# Patient Record
Sex: Male | Born: 1960
Health system: Southern US, Community
[De-identification: ages and names within clinical notes are randomized; demographics above are authoritative.]

## PROBLEM LIST (undated history)

## (undated) DIAGNOSIS — D649 Anemia, unspecified: Secondary | ICD-10-CM

## (undated) DIAGNOSIS — K5790 Diverticulosis of intestine, part unspecified, without perforation or abscess without bleeding: Secondary | ICD-10-CM

## (undated) DIAGNOSIS — K859 Acute pancreatitis without necrosis or infection, unspecified: Secondary | ICD-10-CM

## (undated) DIAGNOSIS — E785 Hyperlipidemia, unspecified: Secondary | ICD-10-CM

## (undated) DIAGNOSIS — J189 Pneumonia, unspecified organism: Secondary | ICD-10-CM

## (undated) DIAGNOSIS — G039 Meningitis, unspecified: Secondary | ICD-10-CM

## (undated) DIAGNOSIS — G709 Myoneural disorder, unspecified: Secondary | ICD-10-CM

## (undated) DIAGNOSIS — I209 Angina pectoris, unspecified: Secondary | ICD-10-CM

## (undated) DIAGNOSIS — I509 Heart failure, unspecified: Secondary | ICD-10-CM

## (undated) DIAGNOSIS — K219 Gastro-esophageal reflux disease without esophagitis: Secondary | ICD-10-CM

## (undated) HISTORY — DX: Myoneural disorder, unspecified: G70.9

## (undated) HISTORY — DX: Diverticulosis of intestine, part unspecified, without perforation or abscess without bleeding: K57.90

## (undated) HISTORY — PX: TONSILLECTOMY AND ADENOIDECTOMY: SUR1326

## (undated) HISTORY — PX: TYMPANOSTOMY TUBE PLACEMENT: SHX32

## (undated) HISTORY — DX: Anemia, unspecified: D64.9

## (undated) HISTORY — DX: Hyperlipidemia, unspecified: E78.5

---

## 2004-08-05 ENCOUNTER — Ambulatory Visit (HOSPITAL_COMMUNITY): Admission: RE | Admit: 2004-08-05 | Discharge: 2004-08-05 | Payer: Self-pay | Admitting: Preventative Medicine

## 2005-01-14 ENCOUNTER — Emergency Department (HOSPITAL_COMMUNITY): Admission: EM | Admit: 2005-01-14 | Discharge: 2005-01-14 | Payer: Self-pay | Admitting: *Deleted

## 2005-10-26 ENCOUNTER — Ambulatory Visit: Payer: Self-pay | Admitting: Infectious Diseases

## 2005-10-26 ENCOUNTER — Ambulatory Visit: Payer: Self-pay | Admitting: Family Medicine

## 2005-10-26 ENCOUNTER — Ambulatory Visit: Payer: Self-pay | Admitting: Internal Medicine

## 2005-10-26 ENCOUNTER — Inpatient Hospital Stay (HOSPITAL_COMMUNITY): Admission: EM | Admit: 2005-10-26 | Discharge: 2005-11-01 | Payer: Self-pay | Admitting: *Deleted

## 2006-03-02 ENCOUNTER — Emergency Department (HOSPITAL_COMMUNITY): Admission: EM | Admit: 2006-03-02 | Discharge: 2006-03-02 | Payer: Self-pay | Admitting: Emergency Medicine

## 2006-08-08 ENCOUNTER — Emergency Department (HOSPITAL_COMMUNITY): Admission: EM | Admit: 2006-08-08 | Discharge: 2006-08-08 | Payer: Self-pay | Admitting: Emergency Medicine

## 2007-10-07 ENCOUNTER — Encounter: Admission: RE | Admit: 2007-10-07 | Discharge: 2007-10-07 | Payer: Self-pay | Admitting: Family Medicine

## 2008-07-22 ENCOUNTER — Inpatient Hospital Stay (HOSPITAL_COMMUNITY): Admission: EM | Admit: 2008-07-22 | Discharge: 2008-08-01 | Payer: Self-pay | Admitting: Emergency Medicine

## 2008-07-22 ENCOUNTER — Ambulatory Visit: Payer: Self-pay | Admitting: Internal Medicine

## 2008-07-24 ENCOUNTER — Ambulatory Visit: Payer: Self-pay | Admitting: Vascular Surgery

## 2008-07-24 ENCOUNTER — Encounter (INDEPENDENT_AMBULATORY_CARE_PROVIDER_SITE_OTHER): Payer: Self-pay | Admitting: Internal Medicine

## 2008-09-25 ENCOUNTER — Encounter: Admission: RE | Admit: 2008-09-25 | Discharge: 2008-09-25 | Payer: Self-pay | Admitting: *Deleted

## 2010-09-24 LAB — GLUCOSE, CAPILLARY
Glucose-Capillary: 103 mg/dL — ABNORMAL HIGH (ref 70–99)
Glucose-Capillary: 171 mg/dL — ABNORMAL HIGH (ref 70–99)
Glucose-Capillary: 231 mg/dL — ABNORMAL HIGH (ref 70–99)
Glucose-Capillary: 238 mg/dL — ABNORMAL HIGH (ref 70–99)
Glucose-Capillary: 258 mg/dL — ABNORMAL HIGH (ref 70–99)
Glucose-Capillary: 104 mg/dL — ABNORMAL HIGH (ref 70–99)
Glucose-Capillary: 104 mg/dL — ABNORMAL HIGH (ref 70–99)
Glucose-Capillary: 104 mg/dL — ABNORMAL HIGH (ref 70–99)
Glucose-Capillary: 111 mg/dL — ABNORMAL HIGH (ref 70–99)
Glucose-Capillary: 112 mg/dL — ABNORMAL HIGH (ref 70–99)
Glucose-Capillary: 117 mg/dL — ABNORMAL HIGH (ref 70–99)
Glucose-Capillary: 126 mg/dL — ABNORMAL HIGH (ref 70–99)
Glucose-Capillary: 133 mg/dL — ABNORMAL HIGH (ref 70–99)
Glucose-Capillary: 148 mg/dL — ABNORMAL HIGH (ref 70–99)
Glucose-Capillary: 152 mg/dL — ABNORMAL HIGH (ref 70–99)
Glucose-Capillary: 155 mg/dL — ABNORMAL HIGH (ref 70–99)
Glucose-Capillary: 161 mg/dL — ABNORMAL HIGH (ref 70–99)
Glucose-Capillary: 179 mg/dL — ABNORMAL HIGH (ref 70–99)
Glucose-Capillary: 186 mg/dL — ABNORMAL HIGH (ref 70–99)
Glucose-Capillary: 193 mg/dL — ABNORMAL HIGH (ref 70–99)
Glucose-Capillary: 193 mg/dL — ABNORMAL HIGH (ref 70–99)
Glucose-Capillary: 197 mg/dL — ABNORMAL HIGH (ref 70–99)
Glucose-Capillary: 202 mg/dL — ABNORMAL HIGH (ref 70–99)
Glucose-Capillary: 205 mg/dL — ABNORMAL HIGH (ref 70–99)
Glucose-Capillary: 208 mg/dL — ABNORMAL HIGH (ref 70–99)
Glucose-Capillary: 210 mg/dL — ABNORMAL HIGH (ref 70–99)
Glucose-Capillary: 222 mg/dL — ABNORMAL HIGH (ref 70–99)
Glucose-Capillary: 226 mg/dL — ABNORMAL HIGH (ref 70–99)
Glucose-Capillary: 244 mg/dL — ABNORMAL HIGH (ref 70–99)
Glucose-Capillary: 262 mg/dL — ABNORMAL HIGH (ref 70–99)
Glucose-Capillary: 87 mg/dL (ref 70–99)
Glucose-Capillary: 87 mg/dL (ref 70–99)

## 2010-09-24 LAB — LEGIONELLA ANTIGEN, URINE

## 2010-09-24 LAB — VANCOMYCIN, TROUGH: Vancomycin Tr: 39.4 ug/mL (ref 10.0–20.0)

## 2010-09-24 LAB — COMPREHENSIVE METABOLIC PANEL
ALT: 23 U/L (ref 0–53)
ALT: 24 U/L (ref 0–53)
ALT: 20 U/L (ref 0–53)
AST: 32 U/L (ref 0–37)
AST: 33 U/L (ref 0–37)
AST: 38 U/L — ABNORMAL HIGH (ref 0–37)
AST: 15 U/L (ref 0–37)
AST: 44 U/L — ABNORMAL HIGH (ref 0–37)
Albumin: 2.8 g/dL — ABNORMAL LOW (ref 3.5–5.2)
Albumin: 3 g/dL — ABNORMAL LOW (ref 3.5–5.2)
Albumin: 1.7 g/dL — ABNORMAL LOW (ref 3.5–5.2)
Albumin: 2.2 g/dL — ABNORMAL LOW (ref 3.5–5.2)
Alkaline Phosphatase: 61 U/L (ref 39–117)
Alkaline Phosphatase: 63 U/L (ref 39–117)
BUN: 13 mg/dL (ref 6–23)
BUN: 9 mg/dL (ref 6–23)
BUN: 25 mg/dL — ABNORMAL HIGH (ref 6–23)
BUN: 6 mg/dL (ref 6–23)
CO2: 24 mEq/L (ref 19–32)
CO2: 25 mEq/L (ref 19–32)
Calcium: 8.7 mg/dL (ref 8.4–10.5)
Calcium: 8.8 mg/dL (ref 8.4–10.5)
Calcium: 9.1 mg/dL (ref 8.4–10.5)
Calcium: 8.5 mg/dL (ref 8.4–10.5)
Calcium: 9.4 mg/dL (ref 8.4–10.5)
Chloride: 96 mEq/L (ref 96–112)
Chloride: 105 mEq/L (ref 96–112)
Chloride: 107 mEq/L (ref 96–112)
Creatinine, Ser: 0.72 mg/dL (ref 0.4–1.5)
Creatinine, Ser: 1.34 mg/dL (ref 0.4–1.5)
Creatinine, Ser: 1.34 mg/dL (ref 0.4–1.5)
Creatinine, Ser: 1.66 mg/dL — ABNORMAL HIGH (ref 0.4–1.5)
GFR calc Af Amer: >60 mL/min (ref >60)
GFR calc Af Amer: >60 mL/min (ref >60)
GFR calc Af Amer: 54 mL/min — ABNORMAL LOW (ref >60)
GFR calc Af Amer: >60 mL/min (ref >60)
GFR calc Af Amer: >60 mL/min (ref >60)
GFR calc non Af Amer: >60 mL/min (ref >60)
GFR calc non Af Amer: 44 mL/min — ABNORMAL LOW (ref >60)
GFR calc non Af Amer: 57 mL/min — ABNORMAL LOW (ref >60)
GFR calc non Af Amer: 57 mL/min — ABNORMAL LOW (ref >60)
Glucose, Bld: 142 mg/dL — ABNORMAL HIGH (ref 70–99)
Glucose, Bld: 235 mg/dL — ABNORMAL HIGH (ref 70–99)
Glucose, Bld: 143 mg/dL — ABNORMAL HIGH (ref 70–99)
Potassium: 3.6 mEq/L (ref 3.5–5.1)
Potassium: 4.1 mEq/L (ref 3.5–5.1)
Sodium: 127 mEq/L — ABNORMAL LOW (ref 135–145)
Sodium: 131 mEq/L — ABNORMAL LOW (ref 135–145)
Sodium: 132 mEq/L — ABNORMAL LOW (ref 135–145)
Total Bilirubin: 2.4 mg/dL — ABNORMAL HIGH (ref 0.3–1.2)
Total Bilirubin: 1.1 mg/dL (ref 0.3–1.2)
Total Bilirubin: 1.8 mg/dL — ABNORMAL HIGH (ref 0.3–1.2)
Total Protein: 6.4 g/dL (ref 6.0–8.3)
Total Protein: 6.8 g/dL (ref 6.0–8.3)
Total Protein: 7.1 g/dL (ref 6.0–8.3)

## 2010-09-24 LAB — BLOOD GAS, ARTERIAL
Acid-Base Excess: 0.7 mmol/L (ref 0.0–2.0)
Acid-base deficit: 0.7 mmol/L (ref 0.0–2.0)
Acid-base deficit: 4.9 mmol/L — ABNORMAL HIGH (ref 0.0–2.0)
Acid-base deficit: 8.1 mmol/L — ABNORMAL HIGH (ref 0.0–2.0)
Bicarbonate: 18.2 mEq/L — ABNORMAL LOW (ref 20.0–24.0)
Bicarbonate: 24.5 mEq/L — ABNORMAL HIGH (ref 20.0–24.0)
Bicarbonate: 23.8 mEq/L (ref 20.0–24.0)
Bicarbonate: 24.5 mEq/L — ABNORMAL HIGH (ref 20.0–24.0)
Bicarbonate: 24.8 mEq/L — ABNORMAL HIGH (ref 20.0–24.0)
Bicarbonate: 30.6 mEq/L — ABNORMAL HIGH (ref 20.0–24.0)
Bicarbonate: 32.1 mEq/L — ABNORMAL HIGH (ref 20.0–24.0)
Drawn by: 129801
Drawn by: 23588
Drawn by: 30996
Drawn by: 235321
Drawn by: 235321
Drawn by: 275531
FIO2: 0.21 %
FIO2: 1 %
FIO2: 0.35 %
FIO2: 0.4 %
FIO2: 0.4 %
FIO2: 0.4 %
MECHVT: 500 mL
MECHVT: 0.4 mL
Mode: POSITIVE
O2 Saturation: 90.7 %
O2 Saturation: 99.3 %
O2 Saturation: 92 %
O2 Saturation: 94.3 %
O2 Saturation: 94.9 %
PEEP: 5 cmH2O
PEEP: 5 cmH2O
PEEP: 5 cmH2O
PEEP: 5 cmH2O
PEEP: 8 cmH2O
PEEP: 8 cmH2O
Patient temperature: 103
Patient temperature: 98.6
Patient temperature: 100.9
Patient temperature: 98.6
Patient temperature: 98.6
Patient temperature: 98.6
Patient temperature: 99.6
RATE: 20 resp/min
RATE: 22 resp/min
RATE: 23 resp/min
RATE: 23 resp/min
TCO2: 16 mmol/L (ref 0–100)
TCO2: 22.5 mmol/L (ref 0–100)
TCO2: 22.5 mmol/L (ref 0–100)
TCO2: 27.8 mmol/L (ref 0–100)
TCO2: 28.8 mmol/L (ref 0–100)
pCO2 arterial: 33.4 mmHg — ABNORMAL LOW (ref 35.0–45.0)
pCO2 arterial: 44.9 mmHg (ref 35.0–45.0)
pCO2 arterial: 36.7 mmHg (ref 35.0–45.0)
pCO2 arterial: 37.3 mmHg (ref 35.0–45.0)
pCO2 arterial: 44.6 mmHg (ref 35.0–45.0)
pH, Arterial: 7.355 (ref 7.350–7.450)
pH, Arterial: 7.422 (ref 7.350–7.450)
pH, Arterial: 7.44 (ref 7.350–7.450)
pH, Arterial: 7.471 — ABNORMAL HIGH (ref 7.350–7.450)
pH, Arterial: 7.494 — ABNORMAL HIGH (ref 7.350–7.450)
pO2, Arterial: 71.2 mmHg — ABNORMAL LOW (ref 80.0–100.0)
pO2, Arterial: 57.4 mmHg — ABNORMAL LOW (ref 80.0–100.0)
pO2, Arterial: 63.2 mmHg — ABNORMAL LOW (ref 80.0–100.0)
pO2, Arterial: 66.9 mmHg — ABNORMAL LOW (ref 80.0–100.0)
pO2, Arterial: 74.9 mmHg — ABNORMAL LOW (ref 80.0–100.0)
pO2, Arterial: 80.7 mmHg (ref 80.0–100.0)
pO2, Arterial: 87.5 mmHg (ref 80.0–100.0)

## 2010-09-24 LAB — TSH: TSH: 0.785 u[IU]/mL (ref 0.350–4.500)

## 2010-09-24 LAB — POCT I-STAT, CHEM 8
BUN: 14 mg/dL (ref 6–23)
Hemoglobin: 15.3 g/dL (ref 13.0–17.0)
Sodium: 131 mEq/L — ABNORMAL LOW (ref 135–145)
TCO2: 17 mmol/L (ref 0–100)

## 2010-09-24 LAB — BASIC METABOLIC PANEL
BUN: 10 mg/dL (ref 6–23)
BUN: 13 mg/dL (ref 6–23)
BUN: 18 mg/dL (ref 6–23)
BUN: 21 mg/dL (ref 6–23)
CO2: 23 mEq/L (ref 19–32)
CO2: 26 mEq/L (ref 19–32)
CO2: 27 mEq/L (ref 19–32)
CO2: 28 mEq/L (ref 19–32)
CO2: 32 mEq/L (ref 19–32)
Calcium: 8.4 mg/dL (ref 8.4–10.5)
Calcium: 8.6 mg/dL (ref 8.4–10.5)
Calcium: 9.1 mg/dL (ref 8.4–10.5)
Chloride: 100 mEq/L (ref 96–112)
Chloride: 104 mEq/L (ref 96–112)
Chloride: 104 mEq/L (ref 96–112)
Chloride: 108 mEq/L (ref 96–112)
Creatinine, Ser: 1.05 mg/dL (ref 0.4–1.5)
Creatinine, Ser: 1.22 mg/dL (ref 0.4–1.5)
Creatinine, Ser: 1.64 mg/dL — ABNORMAL HIGH (ref 0.4–1.5)
GFR calc Af Amer: 54 mL/min — ABNORMAL LOW (ref >60)
GFR calc Af Amer: 57 mL/min — ABNORMAL LOW (ref >60)
GFR calc Af Amer: >60 mL/min (ref >60)
GFR calc Af Amer: >60 mL/min (ref >60)
GFR calc non Af Amer: 44 mL/min — ABNORMAL LOW (ref >60)
GFR calc non Af Amer: 45 mL/min — ABNORMAL LOW (ref >60)
GFR calc non Af Amer: 47 mL/min — ABNORMAL LOW (ref >60)
GFR calc non Af Amer: 55 mL/min — ABNORMAL LOW (ref >60)
Glucose, Bld: 100 mg/dL — ABNORMAL HIGH (ref 70–99)
Glucose, Bld: 110 mg/dL — ABNORMAL HIGH (ref 70–99)
Glucose, Bld: 182 mg/dL — ABNORMAL HIGH (ref 70–99)
Glucose, Bld: 236 mg/dL — ABNORMAL HIGH (ref 70–99)
Potassium: 3.1 mEq/L — ABNORMAL LOW (ref 3.5–5.1)
Potassium: 3.2 mEq/L — ABNORMAL LOW (ref 3.5–5.1)
Potassium: 3.2 mEq/L — ABNORMAL LOW (ref 3.5–5.1)
Sodium: 136 mEq/L (ref 135–145)
Sodium: 139 mEq/L (ref 135–145)
Sodium: 139 mEq/L (ref 135–145)
Sodium: 140 mEq/L (ref 135–145)

## 2010-09-24 LAB — CBC
HCT: 29.2 % — ABNORMAL LOW (ref 39.0–52.0)
HCT: 29.8 % — ABNORMAL LOW (ref 39.0–52.0)
HCT: 30.7 % — ABNORMAL LOW (ref 39.0–52.0)
Hemoglobin: 10 g/dL — ABNORMAL LOW (ref 13.0–17.0)
Hemoglobin: 10.1 g/dL — ABNORMAL LOW (ref 13.0–17.0)
Hemoglobin: 11.8 g/dL — ABNORMAL LOW (ref 13.0–17.0)
MCHC: 33.9 g/dL (ref 30.0–36.0)
MCHC: 33.1 g/dL (ref 30.0–36.0)
MCHC: 33.6 g/dL (ref 30.0–36.0)
MCHC: 34 g/dL (ref 30.0–36.0)
MCHC: 34.2 g/dL (ref 30.0–36.0)
MCHC: 34.2 g/dL (ref 30.0–36.0)
MCV: 91.2 fL (ref 78.0–100.0)
MCV: 91.5 fL (ref 78.0–100.0)
MCV: 91.8 fL (ref 78.0–100.0)
MCV: 92.1 fL (ref 78.0–100.0)
MCV: 92.3 fL (ref 78.0–100.0)
MCV: 93 fL (ref 78.0–100.0)
Platelets: 126 10*3/uL — ABNORMAL LOW (ref 150–400)
Platelets: 133 10*3/uL — ABNORMAL LOW (ref 150–400)
Platelets: 197 10*3/uL (ref 150–400)
Platelets: 278 10*3/uL (ref 150–400)
Platelets: 325 10*3/uL (ref 150–400)
RBC: 3.26 MIL/uL — ABNORMAL LOW (ref 4.22–5.81)
RBC: 3.27 MIL/uL — ABNORMAL LOW (ref 4.22–5.81)
RBC: 3.74 MIL/uL — ABNORMAL LOW (ref 4.22–5.81)
RDW: 13.7 % (ref 11.5–15.5)
RDW: 14.1 % (ref 11.5–15.5)
RDW: 14.3 % (ref 11.5–15.5)
RDW: 14.6 % (ref 11.5–15.5)
WBC: 11.4 10*3/uL — ABNORMAL HIGH (ref 4.0–10.5)
WBC: 12.5 10*3/uL — ABNORMAL HIGH (ref 4.0–10.5)
WBC: 12.7 10*3/uL — ABNORMAL HIGH (ref 4.0–10.5)
WBC: 14.4 10*3/uL — ABNORMAL HIGH (ref 4.0–10.5)

## 2010-09-24 LAB — INFLUENZA A+B VIRUS AG-DIRECT(RAPID)
Inflenza A Ag: NEGATIVE
Influenza B Ag: NEGATIVE

## 2010-09-24 LAB — SEDIMENTATION RATE: Sed Rate: 123 mm/hr — ABNORMAL HIGH (ref 0–16)

## 2010-09-24 LAB — CULTURE, RESPIRATORY W GRAM STAIN

## 2010-09-24 LAB — DIFFERENTIAL
Basophils Absolute: 0 10*3/uL (ref 0.0–0.1)
Lymphocytes Relative: 5 % — ABNORMAL LOW (ref 12–46)
Lymphs Abs: 0.6 10*3/uL — ABNORMAL LOW (ref 0.7–4.0)
Monocytes Relative: 10 % (ref 3–12)
Neutrophils Relative %: 85 % — ABNORMAL HIGH (ref 43–77)
WBC Morphology: INCREASED

## 2010-09-24 LAB — ANTI-NUCLEAR AB-TITER (ANA TITER): ANA Titer 1: 1:40 {titer} — ABNORMAL HIGH

## 2010-09-24 LAB — URINALYSIS, ROUTINE W REFLEX MICROSCOPIC
Glucose, UA: >1000 mg/dL — AB
Ketones, ur: 40 mg/dL — AB
Protein, ur: 100 mg/dL — AB

## 2010-09-24 LAB — CULTURE, BLOOD (ROUTINE X 2)

## 2010-09-24 LAB — VANCOMYCIN, RANDOM
Vancomycin Rm: 15.4 ug/mL
Vancomycin Rm: 19.8 ug/mL

## 2010-09-24 LAB — HIV ANTIBODY (ROUTINE TESTING W REFLEX): HIV: NONREACTIVE

## 2010-09-24 LAB — URINE MICROSCOPIC-ADD ON

## 2010-09-24 LAB — POCT CARDIAC MARKERS
Myoglobin, poc: 128 ng/mL (ref 12–200)
Troponin i, poc: <0.05 ng/mL (ref 0.00–0.09)

## 2010-09-24 LAB — COMPLEMENT, TOTAL

## 2010-09-24 LAB — CULTURE, BAL-QUANTITATIVE W GRAM STAIN: Culture: NO GROWTH

## 2010-09-24 LAB — C-REACTIVE PROTEIN: CRP: 21.5 mg/dL — ABNORMAL HIGH (ref <0.6)

## 2010-09-24 LAB — C3 COMPLEMENT
C3 Complement: 172 mg/dL (ref 88–201)
C3 Complement: 181 mg/dL (ref 88–201)

## 2010-09-24 LAB — ANA: Anti Nuclear Antibody(ANA): POSITIVE — AB

## 2010-09-24 LAB — SODIUM, URINE, RANDOM: Sodium, Ur: <15 mEq/L

## 2010-09-24 LAB — BRAIN NATRIURETIC PEPTIDE: Pro B Natriuretic peptide (BNP): <30 pg/mL (ref 0.0–100.0)

## 2010-09-24 LAB — H1N1 SCREEN (PCR): H1N1 Virus Scrn: DETECTED

## 2010-09-24 LAB — PHOSPHORUS: Phosphorus: 3.8 mg/dL (ref 2.3–4.6)

## 2010-09-24 LAB — PROTIME-INR
INR: 1.3 (ref 0.00–1.49)
Prothrombin Time: 16.5 seconds — ABNORMAL HIGH (ref 11.6–15.2)

## 2010-09-24 LAB — RHEUMATOID FACTOR: Rhuematoid fact SerPl-aCnc: <20 IU/mL (ref 0–20)

## 2010-09-24 LAB — D-DIMER, QUANTITATIVE: D-Dimer, Quant: 1.76 ug/mL-FEU — ABNORMAL HIGH (ref 0.00–0.48)

## 2010-09-24 LAB — MAGNESIUM: Magnesium: 1.9 mg/dL (ref 1.5–2.5)

## 2010-09-24 LAB — OSMOLALITY: Osmolality: 277 mOsm/kg (ref 275–300)

## 2010-09-24 LAB — ANTI-DNA ANTIBODY, DOUBLE-STRANDED: ds DNA Ab: <1 IU/mL (ref <5)

## 2010-09-24 LAB — C4 COMPLEMENT: Complement C4, Body Fluid: 32 mg/dL (ref 16–47)

## 2010-09-24 LAB — OSMOLALITY, URINE: Osmolality, Ur: 674 mOsm/kg (ref 390–1090)

## 2010-09-24 LAB — ANTI-NEUTROPHIL ANTIBODY

## 2010-09-24 LAB — CULTURE, BAL-QUANTITATIVE
Colony Count: NO GROWTH
Gram Stain: NONE SEEN

## 2010-09-24 LAB — LIPID PANEL: HDL: 29 mg/dL — ABNORMAL LOW (ref >39)

## 2010-10-22 NOTE — H&P (Signed)
NAME:  NOBUO, NUNZIATA            ACCOUNT NO.:  [id number]   MEDICAL RECORD NO.:  [id number]          PATIENT TYPE:  EMS   LOCATION:  ED                           FACILITY:  [facility]   PHYSICIAN:  Monte Fantasia, MD  DATE OF BIRTH:  03/13/1961   DATE OF ADMISSION:  07/22/2008  DATE OF DISCHARGE:                              HISTORY & PHYSICAL   PRIMARY CARE PHYSICIAN:  Unassigned.   HISTORY OF PRESENT ILLNESS:  A 50 year old male patient who came in with  a chief complaint of increasing shortness of breath, cough with  productive phlegm, and fevers since the last 4 days.  As per the  patient, he stated that earlier during the week the patient started  having cough with productive phlegm yellowish-green since the last 3-4  days.  For the past of 24 hours the patient started having much  shortness of breath, even on talking.  The patient complains of a low-  grade fever.  However, the patient has not measured his temperature.  He  states it had been since the last 3-4 days.  No history of similar  complaints in the past.  No history of complaints of chest pain.  No  complaints of nausea, vomiting, abdominal pains or diarrhea.  No  complaints of any sick contact.  The shortness of breath is aggravated  even on talking and he complains of shortness of breath at rest.  No  complaints of orthopnea or PND.  The patient's history has been  significant for diabetes, for which he visits his primary care  physician, and states that his sugars have been well controlled except  for past few days, when they have been running high.   ALLERGIES:  NO KNOWN DRUG ALLERGIES.   PAST SURGICAL HISTORY:  None.   MEDICAL HISTORY:  A known history of diabetes.   MEDICATIONS AT HOME:  Glipizide, metformin, and Apidra SoloSTAR  subcutaneous.   SOCIAL HISTORY:  The patient drinks 1-2 beers daily and denies any IV  drug use.  Denies any history of smoking.   FAMILY HISTORY:  Not significant.   REVIEW  OF SYSTEMS:  Unremarkable except those mentioned in the HPI.   On examination, temperature of 99.3, blood pressure of 130/78,  respiratory rate of 38, heart rate of 120, oxygen saturation 93% on room  air.  NECK:  Supple.  HEENT EXAMINATION:  Pupils equal and reacting to light.  No pallor.  No  lymphadenopathy.  Minimal respiratory distress, increased on talking.  Respiratory  distress at rest.  Air entry bilaterally decreased, left more than  right.  Left-sided diffuse crackles, more on the mid to lower third of  the lung fields.  CARDIOVASCULAR EXAMINATION:  S1 and S2 are heard.  Regular rate and  rhythm.  No murmurs.  ABDOMEN:  Soft.  No guarding, no rigidity, no tenderness.  Bowel sounds  active.  EXTREMITIES:  No edema of the feet.   LABS:  Hemoglobin of 7.4.  Total WBC of 11.4, hemoglobin 15.3,  hematocrit 45, platelets of 133 and neutrophils of 85.  Sodium 131,  potassium 4.0, chloride 98,  BUN 14, creatinine 1.3.  Blood sugar 409.  Total CK is 128, CK-MB less than 1, troponin less than 0.05.  Chest x-  ray impression:  Lingular pneumonia.  EKG:  Sinus tachycardia with left  ventricular hypertrophy.  Axis is normal.  No ST-T changes.   ASSESSMENT:  1. Lingular pneumonia.  2. Sepsis secondary to pneumonia.  3. Acute respiratory distress secondary to pneumonia.  4. Diabetes which is uncontrolled.   PLAN:  1. Will admit the patient to the step-down unit, Team H.  Will  have      droplet isolation, to give nasal oxygen via cannula 3 liters per      minute to keep oxygen saturations more than 94%.  The patient has      received ceftriaxone 1 gram IV and Zithromax 500 mg IV in the ED.      Will continue the same.  Will also start the patient on Tamiflu 75      mg p.o. b.i.d. for 5 days.  Will follow up with the blood cultures      done in the ED.  The patient will need respiratory cultures.  We      will give albuterol nebulizations q.6 h. and Tylenol p.r.n.      temperature  more than 100.4.  We will check a CBC, CMET, urinalysis      in the a.m.  Will need an ABG on room air at present for a      baseline.  Also will give IV fluid hydration with half normal      saline at 100 mL per hour with 20 mEq of sodium for 24 hours.  2. Diabetes.  The patient's blood sugars have been uncontrolled.  We      will keep him on sliding-scale insulin q.4 h. and then advance      t.i.d. with meals and h.s. with insulin coverage.  Will also start      the patient on Lantus 10 units subcutaneously q.h.s. and will      adjust the Lantus dose as needed.  At present will hold his home      medications, glipizide, metformin and Apidra for now as the patient      is not sure of the medication doses and will need tighter blood      sugars in view of sepsis.  To continue a carb-modified diet and we      will check a HbA1c for his glycemic control.  3. The patient has a history of alcohol dependence.  Takes 2-3 beers a      day and hard liquor almost every day.  We will give him thiamine      100 mg IV 1 dose and then change it to p.o. daily.  The patient may      need to be put on an Ativan protocol for his alcohol withdrawal if      the patient develops DTs.  4. On DVT prophylaxis on Lovenox, and will give GI prophylaxis with      Protonix.   Total time for the admission is 60 minutes.      Monte Fantasia, MD  Electronically Signed     MP/MEDQ  D:  07/22/2008  T:  07/22/2008  Job:  [id number]

## 2010-10-22 NOTE — Discharge Summary (Signed)
NAME:  Melvin [name], Melvin [name]            ACCOUNT NO.:  [id number]   MEDICAL RECORD NO.:  [id number]          PATIENT TYPE:  INP   LOCATION:  1538                         FACILITY:  [facility]   PHYSICIAN:  Beckey Rutter, MD  DATE OF BIRTH:  12-05-1960   DATE OF ADMISSION:  07/22/2008  DATE OF DISCHARGE:  08/01/2008                               DISCHARGE SUMMARY   PRIMARY CARE PHYSICIAN:  Dr. Loleta Chance.  He is unassigned to [facility].   CHIEF COMPLAINT:  Increasing shortness of breath and cough.   HOSPITAL PROCEDURES AND IMAGING:  1. On July 22, 2008, the patient has chest x-ray.  The impression      was showing lingular pneumonia.  2. July 23, 2008 the patient had chest x-ray.  Impression:      a.     Is worsened left-sided aeration, most consistent with       pneumonia or aspiration.  Recommend radiographic followup until       clearing.      b.     Question concurrent right upper lobe airspace disease versus       summation shadow.  3. July 24, 2008 the patient had a chest x-ray.  Impression:      a.     Is multifocal pneumonia with lingular air space disease       unchanged.      b.     Increased conspicuity of the right apex airspace disease.  4. July 24, 2008 the patient had chest x-ray.  Impression:      a.     Endotracheal tube tip 4 cm of above the carina.      b.     Left central line tip proximal to mid superior vena cava       directed slightly laterally.      c.     No pneumothorax.      d.     No significant change.      e.     Asymmetric air disease, left greater than right.      f.     Cardiomegaly and central pulmonary vascular prominence.  5. July 24, 2008 the patient had CT chest with contrast.      Impression:  a  Bilateral multilobular pulmonary air space disease.  Consistent with  pneumonia.  No evidence of centrally obstructing mass.  b.  Mild mediastinal adenopathy.  Which is likely reactive in nature.  1. July 25, 2008, the patient has  chest x-ray.  Impression:  No      significant change in air space disease involving the left lung.  2. July 25, 2008 portable x-ray to the abdomen.  Impression:      a.     Showing Panda tip in the body of the stomach.      b.     Mild small bowel dilatation which may be due to partial       obstruction or ileus.  3. July 26, 2008, the patient has chest x-ray. Impression:      a.     Removal of nasogastric  tube and placement of feeding tube.       Otherwise support apparatus stable.      b.     Unchanged left lung air space disease when making analysis       from differences in technique.  4. July 27, 2008 the patient had a chest x-ray.  Impression:      a.     Is stable support system and lines.      b.     No change in the left greater than right air space opacities       and left pleural effusion.  5. July 28, 2008 the patient had a chest x-ray.  Impression:  Is      no interval change.  6. July 29, 2008.  The patient has the patient has chest x-ray.      Impression:      a.     Removal of support apparatus with exception of the left IJ       central line.      b.     Slightly improved aeration and persistent lingular       pneumonia.   HOSPITAL COURSE:  1. I have been seeing Mr. Germond since December 21 after his      discharge from intensive care unit.  The patient was intubated on      the 15th for pneumonia and extubated on the 19th.  The patient      currently is stable and I will discharge the patient to follow up      with his primary as discussed with him.  2. Diabetes remained stable during hospital course.  The patient was      advised to continue on glipizide and metformin as well as Apidra.  3. Alcohol dependency and abuse.  The patient will be released to      follow up with AA as before.   DISCHARGE DIAGNOSIS:  1. Pneumonia.  2. Respiratory failure secondary to pneumonia.  3. Status post intubation from July 24, 2008 up to July 28, 2008.  4. Diabetes.  5. Alcohol dependency and abuse.   DISCHARGE MEDICATIONS:  1. Metformin 500 mg p.o. t.i.d.  2. Glipizide 5 mg every a.m. and every p.m.  3. Lantus 40 units at bedtime.  4. Apidra 10 units.  5. Multivitamins daily.  6. Thiamine 100 mg p.o. daily.  7. Folic acid 1 mg daily.  8. Risperdal 0.5 mg p.o. q.h.s.   The patient is stable for discharge.  He was strongly advised to follow  up with his primary physician within 1 week.      Beckey Rutter, MD  Electronically Signed     EME/MEDQ  D:  08/01/2008  T:  08/01/2008  Job:  [id number]

## 2010-10-25 NOTE — Op Note (Signed)
NAME:  Melvin [name], Melvin [name] NO.:  [id number]   MEDICAL RECORD NO.:  [id number]          PATIENT TYPE:  INP   LOCATION:  2103                         FACILITY:  [facility]   PHYSICIAN:  Lucky Cowboy, MD         DATE OF BIRTH:  August 15, 1960   DATE OF PROCEDURE:  10/26/2005  DATE OF DISCHARGE:                                 OPERATIVE REPORT   PREOPERATIVE DIAGNOSIS:  Acute left otitis media with mastoiditis and  meningitis.   POSTOPERATIVE DIAGNOSIS:  Acute left otitis media with mastoiditis and  meningitis.   PROCEDURE:  Left myringotomy with tube placement.   SURGEON:  Dr. Lucky Cowboy.   ANESTHESIA:  General.   ESTIMATED BLOOD LOSS:  None.   COMPLICATIONS:  None.   INDICATIONS:  The patient is a 50 year old male who was admitted within the  past 24 hours with a 2-week history of left otorrhea.  He was obtunded when  he presented to the emergency room.  He was started on Rocephin, vancomycin  and dexamethasone.  He was noted to have meningitis.  CT scan revealed  opacification of the left mastoid air cells without bone destruction.  For  these reasons, the left tube is placed and a culture obtained.   PROCEDURE:  The patient was taken to the operating room and placed on the  table in the supine position.  He was then placed under general mask  anesthesia.  A #4 ear speculum placed into the left external auditory canal.  A culture was obtained of purulent secretions just lateral to the tympanic  membrane.  A myringotomy knife used to make an incision in the anterior  inferior quadrant.  Middle ear fluid evacuated.  A Sheehy tube was placed  through the tympanic membrane and secured in place with a pick.  Ciprodex  Otic was instilled.  The patient was awakened from anesthesia and taken to  the Post Anesthesia Care Unit stable condition.  No complications.      Lucky Cowboy, MD  Electronically Signed     SJ/MEDQ  D:  10/26/2005  T:  10/27/2005  Job:  [id number]   cc:    Asencion Partridge, M.D.  Fax: [phone]

## 2010-12-11 ENCOUNTER — Emergency Department (HOSPITAL_COMMUNITY)
Admission: EM | Admit: 2010-12-11 | Discharge: 2010-12-12 | Disposition: A | Discharge status: Home / Self Care | Payer: BC Managed Care – PPO | Attending: Emergency Medicine | Admitting: Emergency Medicine

## 2010-12-11 DIAGNOSIS — R112 Nausea with vomiting, unspecified: Secondary | ICD-10-CM | POA: Insufficient documentation

## 2010-12-11 DIAGNOSIS — E860 Dehydration: Secondary | ICD-10-CM | POA: Insufficient documentation

## 2010-12-11 DIAGNOSIS — G40909 Epilepsy, unspecified, not intractable, without status epilepticus: Secondary | ICD-10-CM | POA: Insufficient documentation

## 2010-12-11 DIAGNOSIS — R197 Diarrhea, unspecified: Secondary | ICD-10-CM | POA: Insufficient documentation

## 2010-12-11 DIAGNOSIS — R109 Unspecified abdominal pain: Secondary | ICD-10-CM | POA: Insufficient documentation

## 2010-12-11 DIAGNOSIS — E119 Type 2 diabetes mellitus without complications: Secondary | ICD-10-CM | POA: Insufficient documentation

## 2010-12-11 DIAGNOSIS — I10 Essential (primary) hypertension: Secondary | ICD-10-CM | POA: Insufficient documentation

## 2010-12-11 LAB — COMPREHENSIVE METABOLIC PANEL
ALT: 33 U/L (ref 0–53)
Calcium: 9.8 mg/dL (ref 8.4–10.5)
Creatinine, Ser: 1.05 mg/dL (ref 0.50–1.35)
GFR calc Af Amer: >60 mL/min (ref >60)
Glucose, Bld: 394 mg/dL — ABNORMAL HIGH (ref 70–99)
Sodium: 133 mEq/L — ABNORMAL LOW (ref 135–145)
Total Protein: 8.1 g/dL (ref 6.0–8.3)

## 2010-12-11 LAB — CBC
HCT: 41.7 % (ref 39.0–52.0)
Hemoglobin: 15.5 g/dL (ref 13.0–17.0)
RBC: 4.77 MIL/uL (ref 4.22–5.81)
WBC: 6 10*3/uL (ref 4.0–10.5)

## 2010-12-11 LAB — DIFFERENTIAL
Basophils Absolute: 0 10*3/uL (ref 0.0–0.1)
Eosinophils Relative: 2 % (ref 0–5)
Lymphocytes Relative: 17 % (ref 12–46)
Lymphs Abs: 1 10*3/uL (ref 0.7–4.0)
Neutro Abs: 4.1 10*3/uL (ref 1.7–7.7)
Neutrophils Relative %: 69 % (ref 43–77)

## 2010-12-11 LAB — URINE MICROSCOPIC-ADD ON

## 2010-12-11 LAB — BASIC METABOLIC PANEL
CO2: 17 mEq/L — ABNORMAL LOW (ref 19–32)
Calcium: 9.2 mg/dL (ref 8.4–10.5)
Chloride: 92 mEq/L — ABNORMAL LOW (ref 96–112)
Potassium: 3.8 mEq/L (ref 3.5–5.1)
Sodium: 134 mEq/L — ABNORMAL LOW (ref 135–145)

## 2010-12-11 LAB — URINALYSIS, ROUTINE W REFLEX MICROSCOPIC
Glucose, UA: >1000 mg/dL — AB
Hgb urine dipstick: NEGATIVE
Ketones, ur: >80 mg/dL — AB
Protein, ur: NEGATIVE mg/dL

## 2010-12-11 LAB — GLUCOSE, CAPILLARY
Glucose-Capillary: 384 mg/dL — ABNORMAL HIGH (ref 70–99)
Glucose-Capillary: 335 mg/dL — ABNORMAL HIGH (ref 70–99)

## 2010-12-12 LAB — GLUCOSE, CAPILLARY
Glucose-Capillary: 274 mg/dL — ABNORMAL HIGH (ref 70–99)
Glucose-Capillary: 267 mg/dL — ABNORMAL HIGH (ref 70–99)

## 2010-12-12 LAB — BASIC METABOLIC PANEL
CO2: 20 mEq/L (ref 19–32)
Calcium: 8.7 mg/dL (ref 8.4–10.5)
Sodium: 134 mEq/L — ABNORMAL LOW (ref 135–145)

## 2011-05-27 ENCOUNTER — Encounter: Payer: Self-pay | Admitting: *Deleted

## 2011-05-27 ENCOUNTER — Inpatient Hospital Stay (HOSPITAL_COMMUNITY)
Admission: EM | Admit: 2011-05-27 | Discharge: 2011-05-30 | DRG: 204 | Disposition: A | Discharge status: Home / Self Care | Payer: BC Managed Care – PPO | Source: Ambulatory Visit | Attending: Internal Medicine | Admitting: Internal Medicine

## 2011-05-27 DIAGNOSIS — K859 Acute pancreatitis without necrosis or infection, unspecified: Secondary | ICD-10-CM | POA: Diagnosis present

## 2011-05-27 DIAGNOSIS — E119 Type 2 diabetes mellitus without complications: Secondary | ICD-10-CM | POA: Diagnosis present

## 2011-05-27 DIAGNOSIS — I771 Stricture of artery: Secondary | ICD-10-CM | POA: Diagnosis present

## 2011-05-27 DIAGNOSIS — R911 Solitary pulmonary nodule: Secondary | ICD-10-CM | POA: Diagnosis present

## 2011-05-27 DIAGNOSIS — F1020 Alcohol dependence, uncomplicated: Secondary | ICD-10-CM | POA: Diagnosis present

## 2011-05-27 DIAGNOSIS — R188 Other ascites: Secondary | ICD-10-CM | POA: Diagnosis present

## 2011-05-27 DIAGNOSIS — K766 Portal hypertension: Secondary | ICD-10-CM | POA: Diagnosis present

## 2011-05-27 DIAGNOSIS — R197 Diarrhea, unspecified: Secondary | ICD-10-CM | POA: Diagnosis present

## 2011-05-27 DIAGNOSIS — I868 Varicose veins of other specified sites: Secondary | ICD-10-CM | POA: Diagnosis present

## 2011-05-27 HISTORY — DX: Gastro-esophageal reflux disease without esophagitis: K21.9

## 2011-05-27 HISTORY — DX: Meningitis, unspecified: G03.9

## 2011-05-27 HISTORY — DX: Acute pancreatitis without necrosis or infection, unspecified: K85.90

## 2011-05-27 HISTORY — DX: Pneumonia, unspecified organism: J18.9

## 2011-05-27 LAB — CBC
MCH: 33 pg (ref 26.0–34.0)
MCHC: 35.5 g/dL (ref 30.0–36.0)
MCV: 92.8 fL (ref 78.0–100.0)
Platelets: 333 10*3/uL (ref 150–400)
RDW: 12.8 % (ref 11.5–15.5)
WBC: 6.3 10*3/uL (ref 4.0–10.5)

## 2011-05-27 LAB — GLUCOSE, CAPILLARY: Glucose-Capillary: 370 mg/dL — ABNORMAL HIGH (ref 70–99)

## 2011-05-27 LAB — POCT I-STAT, CHEM 8
Calcium, Ion: 1.2 mmol/L (ref 1.12–1.32)
Chloride: 96 mEq/L (ref 96–112)
HCT: 50 % (ref 39.0–52.0)
TCO2: 30 mmol/L (ref 0–100)

## 2011-05-27 LAB — COMPREHENSIVE METABOLIC PANEL
ALT: 11 U/L (ref 0–53)
AST: 27 U/L (ref 0–37)
Calcium: 9.6 mg/dL (ref 8.4–10.5)
Sodium: 132 mEq/L — ABNORMAL LOW (ref 135–145)
Total Protein: 7.4 g/dL (ref 6.0–8.3)

## 2011-05-27 LAB — DIFFERENTIAL
Basophils Absolute: 0 10*3/uL (ref 0.0–0.1)
Basophils Relative: 0 % (ref 0–1)
Eosinophils Absolute: 0 10*3/uL (ref 0.0–0.7)
Eosinophils Relative: 0 % (ref 0–5)

## 2011-05-27 MED ORDER — INSULIN ASPART 100 UNIT/ML ~~LOC~~ SOLN
10.0000 [IU] | Freq: Once | SUBCUTANEOUS | Status: AC
  Administered 2011-05-28: 10 [IU] via INTRAVENOUS
  Filled 2011-05-27: qty 1

## 2011-05-27 MED ORDER — SODIUM CHLORIDE 0.9 % IV BOLUS (SEPSIS)
1000.0000 mL | Freq: Once | INTRAVENOUS | Status: AC
  Administered 2011-05-27: 1000 mL via INTRAVENOUS

## 2011-05-27 MED ORDER — SODIUM CHLORIDE 0.9 % IV BOLUS (SEPSIS)
1000.0000 mL | Freq: Once | INTRAVENOUS | Status: AC
  Administered 2011-05-28: 1000 mL via INTRAVENOUS

## 2011-05-27 MED ORDER — MORPHINE SULFATE 4 MG/ML IJ SOLN
4.0000 mg | Freq: Once | INTRAMUSCULAR | Status: AC
  Administered 2011-05-27: 4 mg via INTRAVENOUS
  Filled 2011-05-27: qty 1

## 2011-05-27 MED ORDER — INSULIN REGULAR HUMAN 100 UNIT/ML IJ SOLN: 10.0000 [IU] | Freq: Once | INTRAMUSCULAR | Status: DC

## 2011-05-27 MED ORDER — ONDANSETRON HCL 4 MG/2ML IJ SOLN
4.0000 mg | Freq: Once | INTRAMUSCULAR | Status: AC
  Administered 2011-05-27: 4 mg via INTRAVENOUS
  Filled 2011-05-27: qty 2

## 2011-05-27 NOTE — ED Notes (Signed)
Patient stated that yesterday (Tuesday) he started with diahrrea then his lower and area started to hurt.  Denies N/V  Ate grits and 1 piece of bacon today and had jello for lunch today and has not had to use the bathroom

## 2011-05-27 NOTE — ED Notes (Signed)
CBG 370. 

## 2011-05-27 NOTE — ED Notes (Signed)
Paged [facility] to [phone]

## 2011-05-27 NOTE — ED Provider Notes (Signed)
History     CSN: [id number] Arrival date & time: 05/27/2011  6:57 PM   First MD Initiated Contact with Patient 05/27/11 2137      Chief Complaint  Patient presents with  . Abdominal Pain    (Consider location/radiation/quality/duration/timing/severity/associated sxs/prior treatment) The history is provided by the patient.   Patient is a 56 old male with history of alcoholic pancreatitis who presents with abdominal pain.  This started one day ago. It started abruptly and has been intermittent. Has become more prominent. It does wax and wane. He has had associated diarrhea with this. He's had a few episodes of diarrhea today but there's been no blood or dark stool.  Pain mildly improved the diarrhea. Not significantly changed with by mouth though patient has had decreased appetite. This is similar to prior pancreatitis with the exception of the diarrhea. He states he usually has nausea and vomiting. He has not had any nontender vomiting this illness. No recent fever, sick contact, bad food exposure, travel, camping. Overall severity described as moderate.  Past Medical History  Diagnosis Date  . Diabetes mellitus     History reviewed. No pertinent past surgical history.  No family history on file.  History  Substance Use Topics  . Smoking status: Never Smoker   . Smokeless tobacco: Not on file  . Alcohol Use: Yes      Review of Systems  Constitutional: Negative for fever, chills and activity change.  HENT: Negative for congestion and neck pain.   Respiratory: Negative for cough, chest tightness, shortness of breath and wheezing.   Cardiovascular: Negative for chest pain.  Gastrointestinal: Positive for abdominal pain and diarrhea. Negative for nausea, vomiting and abdominal distention.  Genitourinary: Negative for difficulty urinating.  Musculoskeletal: Negative for gait problem.  Skin: Negative for rash.  Neurological: Negative for weakness and numbness.    Psychiatric/Behavioral: Negative for behavioral problems and confusion.  All other systems reviewed and are negative.    Allergies  Review of patient's allergies indicates no known allergies.  Home Medications   Current Outpatient Rx  Name Route Sig Dispense Refill  . GLIPIZIDE 10 MG PO TABS Oral Take 20 mg by mouth daily.      . INSULIN GLARGINE 100 UNIT/ML [place] SOLN Subcutaneous Inject 28 Units into the skin at bedtime.      . INSULIN GLULISINE 100 UNIT/ML [place] SOLN Subcutaneous Inject 6 Units into the skin daily after breakfast.      . METFORMIN HCL 1000 MG PO TABS Oral Take 1,000 mg by mouth 2 (two) times daily with a meal.        BP 117/75  Pulse 89  Temp(Src) 97.5 F (36.4 C) (Oral)  Resp 16  Ht 5\' 8"  (1.727 m)  Wt 140 lb (63.504 kg)  BMI 21.29 kg/m2  SpO2 100%  Physical Exam  Nursing note and vitals reviewed. Constitutional: He is oriented to person, place, and time. He appears well-developed and well-nourished. No distress.  HENT:  Head: Normocephalic.  Nose: Nose normal.  Eyes: EOM are normal. Pupils are equal, round, and reactive to light.  Neck: Normal range of motion. Neck supple.  Cardiovascular: Normal rate, regular rhythm and intact distal pulses.   Pulmonary/Chest: Effort normal and breath sounds normal. No respiratory distress. He exhibits no tenderness.  Abdominal: Soft. He exhibits no distension. There is tenderness (Mild to moderate periumbilical and epigastric tenderness palpation without peritonitis).  Musculoskeletal: Normal range of motion. He exhibits no edema and no tenderness.  Neurological:  He is alert and oriented to person, place, and time.       Normal strength  Skin: Skin is warm and dry. He is not diaphoretic.  Psychiatric: He has a normal mood and affect. His behavior is normal. Thought content normal.    ED Course  Procedures (including critical care time)  Labs Reviewed  POCT I-STAT, CHEM 8 - Abnormal; Notable for the following:     Glucose, Bld 356 (*)    All other components within normal limits  GLUCOSE, CAPILLARY - Abnormal; Notable for the following:    Glucose-Capillary 370 (*)    All other components within normal limits  DIFFERENTIAL - Abnormal; Notable for the following:    Monocytes Relative 15 (*)    All other components within normal limits  COMPREHENSIVE METABOLIC PANEL - Abnormal; Notable for the following:    Sodium 132 (*)    Chloride 92 (*)    Glucose, Bld 346 (*)    Albumin 3.2 (*)    Total Bilirubin 1.5 (*)    All other components within normal limits  LIPASE, BLOOD - Abnormal; Notable for the following:    Lipase 290 (*)    All other components within normal limits  CBC  I-STAT, CHEM 8  POCT CBG MONITORING  POCT CBG MONITORING   No results found.   1. Pancreatitis       MDM   Patient with history of pancreatitis who is here with abdominal pain. His abdomen is mild to moderately tender to palpation but without peritonitis. Lipase is elevated consistent with pancreatitis. Based on recent alcohol consumption this is most consistent with alcoholic pancreatitis. Patient with heart rate in the 120s initially. This did improve with a liter of saline but still heart rate about 100. Patient with elevated glucose and upper 300s. Insulin and additional IV fluids given. Symptom control morphine and Zofran. Based on his hyperglycemia and pancreatitis, will admit to hospitalist. Patient remained hemodynamically stable in ED. Acute abdomen series ordered at recommendation of admitting provider. They will followup on results.        Milus Glazier 05/28/11 0004

## 2011-05-27 NOTE — ED Notes (Signed)
Patient reports onset of abd pain on yesterday.  He denies n/v.  Patient reports diarrhea.  Patient is noted to be tachy in triage,  Rate of 126

## 2011-05-28 ENCOUNTER — Inpatient Hospital Stay (HOSPITAL_COMMUNITY): Payer: BC Managed Care – PPO

## 2011-05-28 ENCOUNTER — Encounter (HOSPITAL_COMMUNITY): Payer: Self-pay | Admitting: Internal Medicine

## 2011-05-28 ENCOUNTER — Emergency Department (HOSPITAL_COMMUNITY): Payer: BC Managed Care – PPO

## 2011-05-28 DIAGNOSIS — E119 Type 2 diabetes mellitus without complications: Secondary | ICD-10-CM | POA: Diagnosis present

## 2011-05-28 DIAGNOSIS — K859 Acute pancreatitis without necrosis or infection, unspecified: Secondary | ICD-10-CM | POA: Diagnosis present

## 2011-05-28 LAB — COMPREHENSIVE METABOLIC PANEL
ALT: 8 U/L (ref 0–53)
CO2: 28 mEq/L (ref 19–32)
Calcium: 8.4 mg/dL (ref 8.4–10.5)
Chloride: 99 mEq/L (ref 96–112)
Creatinine, Ser: 0.78 mg/dL (ref 0.50–1.35)
GFR calc Af Amer: >90 mL/min (ref >90)
GFR calc non Af Amer: >90 mL/min (ref >90)
Glucose, Bld: 164 mg/dL — ABNORMAL HIGH (ref 70–99)
Total Bilirubin: 1.3 mg/dL — ABNORMAL HIGH (ref 0.3–1.2)

## 2011-05-28 LAB — CBC
MCH: 32.7 pg (ref 26.0–34.0)
MCV: 93.4 fL (ref 78.0–100.0)
Platelets: 245 10*3/uL (ref 150–400)
RBC: 3.94 MIL/uL — ABNORMAL LOW (ref 4.22–5.81)
RDW: 12.9 % (ref 11.5–15.5)
WBC: 5 10*3/uL (ref 4.0–10.5)

## 2011-05-28 LAB — GLUCOSE, CAPILLARY
Glucose-Capillary: 165 mg/dL — ABNORMAL HIGH (ref 70–99)
Glucose-Capillary: 224 mg/dL — ABNORMAL HIGH (ref 70–99)

## 2011-05-28 LAB — HEMOGLOBIN A1C: Hgb A1c MFr Bld: 7.8 % — ABNORMAL HIGH (ref <5.7)

## 2011-05-28 MED ORDER — LORAZEPAM 0.5 MG PO TABS: 1.0000 mg | ORAL_TABLET | Freq: Four times a day (QID) | ORAL | Status: DC | PRN

## 2011-05-28 MED ORDER — SODIUM CHLORIDE 0.9 % IV SOLN
INTRAVENOUS | Status: DC
  Administered 2011-05-28: 06:00:00 via INTRAVENOUS

## 2011-05-28 MED ORDER — INSULIN GLARGINE 100 UNIT/ML ~~LOC~~ SOLN
5.0000 [IU] | Freq: Every day | SUBCUTANEOUS | Status: DC
  Administered 2011-05-28 – 2011-05-30 (×3): 5 [IU] via SUBCUTANEOUS
  Filled 2011-05-28 (×2): qty 3
  Filled 2011-05-28: qty 1

## 2011-05-28 MED ORDER — ONDANSETRON HCL 4 MG/2ML IJ SOLN: 4.0000 mg | Freq: Four times a day (QID) | INTRAMUSCULAR | Status: DC | PRN

## 2011-05-28 MED ORDER — ACETAMINOPHEN 325 MG PO TABS: 650.0000 mg | ORAL_TABLET | Freq: Four times a day (QID) | ORAL | Status: DC | PRN

## 2011-05-28 MED ORDER — POTASSIUM CHLORIDE IN NACL 40-0.9 MEQ/L-% IV SOLN
INTRAVENOUS | Status: AC
  Administered 2011-05-28: 14:00:00 via INTRAVENOUS
  Filled 2011-05-28 (×2): qty 1000

## 2011-05-28 MED ORDER — ADULT MULTIVITAMIN W/MINERALS CH
1.0000 | ORAL_TABLET | Freq: Every day | ORAL | Status: DC
  Administered 2011-05-28 – 2011-05-30 (×3): 1 via ORAL
  Filled 2011-05-28 (×3): qty 1

## 2011-05-28 MED ORDER — FOLIC ACID 1 MG PO TABS
1.0000 mg | ORAL_TABLET | Freq: Every day | ORAL | Status: DC
  Administered 2011-05-28 – 2011-05-30 (×3): 1 mg via ORAL
  Filled 2011-05-28 (×3): qty 1

## 2011-05-28 MED ORDER — POTASSIUM CHLORIDE CRYS ER 20 MEQ PO TBCR
40.0000 meq | EXTENDED_RELEASE_TABLET | Freq: Once | ORAL | Status: AC
  Administered 2011-05-28: 40 meq via ORAL
  Filled 2011-05-28: qty 2

## 2011-05-28 MED ORDER — PANTOPRAZOLE SODIUM 40 MG IV SOLR
40.0000 mg | INTRAVENOUS | Status: DC
  Administered 2011-05-28 – 2011-05-30 (×3): 40 mg via INTRAVENOUS
  Filled 2011-05-28 (×4): qty 40

## 2011-05-28 MED ORDER — INSULIN ASPART 100 UNIT/ML ~~LOC~~ SOLN
0.0000 [IU] | SUBCUTANEOUS | Status: DC
  Administered 2011-05-28: 5 [IU] via SUBCUTANEOUS
  Administered 2011-05-28: 3 [IU] via SUBCUTANEOUS
  Administered 2011-05-29: 5 [IU] via SUBCUTANEOUS
  Administered 2011-05-29 (×2): 3 [IU] via SUBCUTANEOUS
  Administered 2011-05-29: 5 [IU] via SUBCUTANEOUS
  Administered 2011-05-30: 3 [IU] via SUBCUTANEOUS
  Administered 2011-05-30: 8 [IU] via SUBCUTANEOUS
  Administered 2011-05-30: 2 [IU] via SUBCUTANEOUS
  Filled 2011-05-28 (×2): qty 3
  Filled 2011-05-28: qty 1
  Filled 2011-05-28: qty 3

## 2011-05-28 MED ORDER — INSULIN ASPART 100 UNIT/ML ~~LOC~~ SOLN: 0.0000 [IU] | Freq: Three times a day (TID) | SUBCUTANEOUS | Status: DC

## 2011-05-28 MED ORDER — IOHEXOL 300 MG/ML  SOLN
80.0000 mL | Freq: Once | INTRAMUSCULAR | Status: AC | PRN
  Administered 2011-05-28: 80 mL via INTRAVENOUS

## 2011-05-28 MED ORDER — THIAMINE HCL 100 MG/ML IJ SOLN
100.0000 mg | Freq: Every day | INTRAMUSCULAR | Status: DC
  Filled 2011-05-28 (×3): qty 1

## 2011-05-28 MED ORDER — ONDANSETRON HCL 4 MG PO TABS: 4.0000 mg | ORAL_TABLET | Freq: Four times a day (QID) | ORAL | Status: DC | PRN

## 2011-05-28 MED ORDER — VITAMIN B-1 100 MG PO TABS
100.0000 mg | ORAL_TABLET | Freq: Every day | ORAL | Status: DC
  Administered 2011-05-28 – 2011-05-30 (×3): 100 mg via ORAL
  Filled 2011-05-28 (×3): qty 1

## 2011-05-28 MED ORDER — FOLIC ACID 1 MG PO TABS: 1.0000 mg | ORAL_TABLET | Freq: Every day | ORAL | Status: DC

## 2011-05-28 MED ORDER — MORPHINE SULFATE 2 MG/ML IJ SOLN: 1.0000 mg | INTRAMUSCULAR | Status: DC | PRN

## 2011-05-28 MED ORDER — ACETAMINOPHEN 650 MG RE SUPP: 650.0000 mg | Freq: Four times a day (QID) | RECTAL | Status: DC | PRN

## 2011-05-28 MED ORDER — LORAZEPAM 2 MG/ML IJ SOLN: 1.0000 mg | Freq: Four times a day (QID) | INTRAMUSCULAR | Status: DC | PRN

## 2011-05-28 MED ORDER — VITAMIN B-1 100 MG PO TABS
100.0000 mg | ORAL_TABLET | Freq: Every day | ORAL | Status: DC
  Filled 2011-05-28: qty 1

## 2011-05-28 NOTE — ED Notes (Signed)
cbg is 170 

## 2011-05-28 NOTE — ED Notes (Signed)
CBG 224 

## 2011-05-28 NOTE — ED Notes (Signed)
Pt ambulatory to restroom

## 2011-05-28 NOTE — ED Provider Notes (Signed)
Medical screening examination/treatment/procedure(s) were conducted as a shared visit with resident practitioner(s) and myself.  I personally evaluated the patient during the encounter  History of pancreatitis and diabetes. Presents hyperglycemic, nausea, vomiting, epigastric pain. Consistent with prior pancreatitis pain  Initially tachycardic on evaluation. Lungs clear to auscultation bilaterally Epigastric pain on palpation with no rebound or guarding  Labs, symptom control, IV fluids. Will require admission likely for further pain control given his hyperglycemia.   Dayton Bailiff, MD 05/28/11 [phone]

## 2011-05-28 NOTE — Progress Notes (Signed)
Subjective: Feels better this morning, no abdominal pain, wants to eat. No diarrhea. No new issues.  Objective: Vital signs in last 24 hours: Temp:  [97.5 F (36.4 C)-98.4 F (36.9 C)] 98.4 F (36.9 C) (12/19 0621) Pulse Rate:  [89-122] 93  (12/19 0621) Resp:  [16-28] 18  (12/19 0621) BP: (104-139)/(52-94) 104/52 mmHg (12/19 0621) SpO2:  [99 %-100 %] 100 % (12/19 0621) Weight:  [63.504 kg (140 lb)] 140 lb (63.504 kg) (12/18 1858) Weight change:     Intake/Output from previous day:       Physical Exam: General: Comfortable, alert, communicative, fully oriented, not short of breath at rest.  HEENT:  No clinical pallor, no jaundice, no conjunctival injection or discharge. NECK:  Supple, JVP not seen, no carotid bruits, no palpable lymphadenopathy, no palpable goiter. CHEST:  Clinically clear to auscultation, no wheezes, no crackles. HEART:  Sounds 1 and 2 heard, normal, regular, no murmurs. ABDOMEN:  Full, soft, no scars, non-tender, no palpable organomegaly, no palpable masses, normal bowel sounds. GENITALIA:  Not examined. LOWER EXTREMITIES:  No pitting edema, palpable peripheral pulses. MUSCULOSKELETAL SYSTEM:  Generalized osteoarthritic changes, otherwise, normal. CENTRAL NERVOUS SYSTEM:  No focal neurologic deficit on gross examination.  Lab Results:  Basename 05/28/11 0458 05/27/11 2212  WBC 5.0 6.3  HGB 12.9* 15.6  HCT 36.8* 43.9  PLT 245 333    Basename 05/28/11 0458 05/27/11 2212  NA 135 132*  K 3.1* 4.0  CL 99 92*  CO2 28 28  GLUCOSE 164* 346*  BUN 10 12  CREATININE 0.78 0.89  CALCIUM 8.4 9.6   No results found for this or any previous visit (from the past 240 hour(s)).   Studies/Results: Dg Abd Acute W/chest  05/28/2011  *RADIOLOGY REPORT*  Clinical Data: Abdominal pain, history pancreatitis.  ACUTE ABDOMEN SERIES (ABDOMEN 2 VIEW & CHEST 1 VIEW)  Comparison: 07/25/2008  Findings: Interstitial prominence and mild lung base opacities. Cardiomediastinal  contours within normal limits.  Nonobstructive bowel gas pattern.  No free intraperitoneal air. Organ outlines are normal where seen.  Degenerative changes without acute osseous abnormality.  IMPRESSION: Nonobstructive bowel gas pattern.  Mild lung base opacities may represent atelectasis, scarring, or infiltrate.  Original Report Authenticated By: Waneta Martins, M.D.    Medications: Scheduled Meds:   . insulin aspart  0-9 Units Subcutaneous TID WC  . insulin aspart  10 Units Intravenous Once  . insulin glargine  5 Units Subcutaneous Daily  .  morphine injection  4 mg Intravenous Once  . ondansetron (ZOFRAN) IV  4 mg Intravenous Once  . sodium chloride  1,000 mL Intravenous Once  . sodium chloride  1,000 mL Intravenous Once  . DISCONTD: insulin regular  10 Units Intravenous Once   Continuous Infusions:   . sodium chloride 125 mL/hr at 05/28/11 0540   PRN Meds:.  Assessment/Plan:  Principal Problem:  *Pancreatitis: This is acute on chronic, secondary to ETOH abuse. Will manage with bowel rest, PPI, ivi fluids and analgesics. As patient wants to eat, will commence clears, as tolerated. Active Problems:  1. Diabetes mellitus: Suboptimally controlled. Will increase SSI. HBA1C is pending. Patent claims compliance with home meds. 2. ETOH abuse: Last drink was on 05/25/11. No evidence of withdrawal phenomena at this time. Continue vitamin supplements. Patient has been counseled.  Comment: Follow lytes and replete as appropriate.  LOS: 1 day   Amilah Greenspan,CHRISTOPHER 05/28/2011, 8:21 AM

## 2011-05-28 NOTE — H&P (Signed)
Melvin [name] is an 50 y.o. male.   PCP - Dr Mirna Mires of [facility]. Chief Complaint: Abdominal pain. HPI: A 50 year old male with history of recurrent pancreatitis from alcoholism presented to the ER with abdominal pain since yesterday. The pain is more epigastric area nonradiating and constant. It is associated some diarrhea he had 2 episodes today and 2 episodes yesterday. Denies any nausea vomiting. [name]. Denies any chest pain shortness of breath fever chills headache dizziness or loss of consciousness. In the ER patient had lab work which shows elevated lipase and acute abdominal series is negative for any acute. Patient has been admitted for acute on chronic pancreatitis the cause of which is probably alcoholism.  Past Medical History  Diagnosis Date  . Diabetes mellitus   . Meningitis   . Pneumonia     History reviewed. No pertinent past surgical history.  Family History  Problem Relation Age of Onset  . Heart failure Mother    Social History:  reports that he has never smoked. He does not have any smokeless tobacco history on file. He reports that he drinks alcohol. He reports that he does not use illicit drugs.  Allergies: No Known Allergies  Medications Prior to Admission  Medication Dose Route Frequency Provider Last Rate Last Dose  . insulin aspart (novoLOG) injection 10 Units  10 Units Intravenous Once [employer], [place]   10 Units at 05/28/11 0002  . morphine 4 MG/ML injection 4 mg  4 mg Intravenous Once [employer]   4 mg at 05/27/11 2234  . ondansetron (ZOFRAN) injection 4 mg  4 mg Intravenous Once [employer]   4 mg at 05/27/11 2234  . sodium chloride 0.9 % bolus 1,000 mL  1,000 mL Intravenous Once [employer]   1,000 mL at 05/27/11 2234  . sodium chloride 0.9 % bolus 1,000 mL  1,000 mL Intravenous Once [employer]   1,000 mL at 05/28/11 0003  . DISCONTD: insulin regular (NOVOLIN R,HUMULIN R) 100  units/mL injection 10 Units  10 Units Intravenous Once Milus Glazier       No current outpatient prescriptions on file as of 05/27/2011.    Results for orders placed during the hospital encounter of 05/27/11 (from the past 48 hour(s))  POCT I-STAT, CHEM 8     Status: Abnormal   Collection Time   05/27/11  9:42 PM      Component Value Range Comment   Sodium 135  135 - 145 (mEq/L)    Potassium 3.9  3.5 - 5.1 (mEq/L)    Chloride 96  96 - 112 (mEq/L)    BUN 13  6 - 23 (mg/dL)    Creatinine, Ser 1.61  0.50 - 1.35 (mg/dL)    Glucose, Bld 096 (*) 70 - 99 (mg/dL)    Calcium, Ion 0.45  1.12 - 1.32 (mmol/L)    TCO2 30  0 - 100 (mmol/L)    Hemoglobin 17.0  13.0 - 17.0 (g/dL)    HCT 40.9  81.1 - 91.4 (%)   GLUCOSE, CAPILLARY     Status: Abnormal   Collection Time   05/27/11  9:53 PM      Component Value Range Comment   Glucose-Capillary 370 (*) 70 - 99 (mg/dL)    Comment 1 Documented in Chart      Comment 2 Notify RN     CBC     Status: Normal   Collection Time   05/27/11  10:12 PM      Component Value Range Comment   WBC 6.3  4.0 - 10.5 (K/uL)    RBC 4.73  4.22 - 5.81 (MIL/uL)    Hemoglobin 15.6  13.0 - 17.0 (g/dL)    HCT 16.1  09.6 - 04.5 (%)    MCV 92.8  78.0 - 100.0 (fL)    MCH 33.0  26.0 - 34.0 (pg)    MCHC 35.5  30.0 - 36.0 (g/dL)    RDW 40.9  81.1 - 91.4 (%)    Platelets 333  150 - 400 (K/uL)   DIFFERENTIAL     Status: Abnormal   Collection Time   05/27/11 10:12 PM      Component Value Range Comment   Neutrophils Relative 70  43 - 77 (%)    Neutro Abs 4.4  1.7 - 7.7 (K/uL)    Lymphocytes Relative 14  12 - 46 (%)    Lymphs Abs 0.9  0.7 - 4.0 (K/uL)    Monocytes Relative 15 (*) 3 - 12 (%)    Monocytes Absolute 0.9  0.1 - 1.0 (K/uL)    Eosinophils Relative 0  0 - 5 (%)    Eosinophils Absolute 0.0  0.0 - 0.7 (K/uL)    Basophils Relative 0  0 - 1 (%)    Basophils Absolute 0.0  0.0 - 0.1 (K/uL)   COMPREHENSIVE METABOLIC PANEL     Status: Abnormal   Collection Time    05/27/11 10:12 PM      Component Value Range Comment   Sodium 132 (*) 135 - 145 (mEq/L)    Potassium 4.0  3.5 - 5.1 (mEq/L) HEMOLYSIS AT THIS LEVEL MAY AFFECT RESULT   Chloride 92 (*) 96 - 112 (mEq/L)    CO2 28  19 - 32 (mEq/L)    Glucose, Bld 346 (*) 70 - 99 (mg/dL)    BUN 12  6 - 23 (mg/dL)    Creatinine, Ser 7.82  0.50 - 1.35 (mg/dL)    Calcium 9.6  8.4 - 10.5 (mg/dL)    Total Protein 7.4  6.0 - 8.3 (g/dL)    Albumin 3.2 (*) 3.5 - 5.2 (g/dL)    AST 27  0 - 37 (U/L) HEMOLYSIS AT THIS LEVEL MAY AFFECT RESULT   ALT 11  0 - 53 (U/L)    Alkaline Phosphatase 109  39 - 117 (U/L)    Total Bilirubin 1.5 (*) 0.3 - 1.2 (mg/dL)    GFR calc non Af Amer >90  >90 (mL/min)    GFR calc Af Amer >90  >90 (mL/min)   LIPASE, BLOOD     Status: Abnormal   Collection Time   05/27/11 10:12 PM      Component Value Range Comment   Lipase 290 (*) 11 - 59 (U/L)   GLUCOSE, CAPILLARY     Status: Abnormal   Collection Time   05/28/11 12:20 AM      Component Value Range Comment   Glucose-Capillary 224 (*) 70 - 99 (mg/dL)    Comment 1 Documented in Chart      Comment 2 Notify RN      Dg Abd Acute W/chest  05/28/2011  *RADIOLOGY REPORT*  Clinical Data: Abdominal pain, history pancreatitis.  ACUTE ABDOMEN SERIES (ABDOMEN 2 VIEW & CHEST 1 VIEW)  Comparison: 07/25/2008  Findings: Interstitial prominence and mild lung base opacities. Cardiomediastinal contours within normal limits.  Nonobstructive bowel gas pattern.  No free intraperitoneal air. Organ outlines are normal where  seen.  Degenerative changes without acute osseous abnormality.  IMPRESSION: Nonobstructive bowel gas pattern.  Mild lung base opacities may represent atelectasis, scarring, or infiltrate.  Original Report Authenticated By: Waneta Martins, M.D.    Review of Systems  Constitutional: Negative.   HENT: Negative.   Eyes: Negative.   Respiratory: Negative.   Cardiovascular: Negative.   Gastrointestinal: Positive for vomiting and abdominal  pain.  Genitourinary: Negative.   Musculoskeletal: Negative.   Skin: Negative.   Neurological: Negative.   Endo/Heme/Allergies: Negative.   Psychiatric/Behavioral: Negative.     Blood pressure 117/75, pulse 89, temperature 97.5 F (36.4 C), temperature source Oral, resp. rate 16, height 5\' 8"  (1.727 m), weight 63.504 kg (140 lb), SpO2 100.00%. Physical Exam  Constitutional: He is oriented to person, place, and time. He appears well-developed and well-nourished. No distress.  HENT:  Head: Normocephalic and atraumatic.  Right Ear: External ear normal.  Left Ear: External ear normal.  Nose: Nose normal.  Mouth/Throat: Oropharynx is clear and moist. No oropharyngeal exudate.  Eyes: Conjunctivae and EOM are normal. Pupils are equal, round, and reactive to light. Right eye exhibits no discharge. Left eye exhibits no discharge. No scleral icterus.  Neck: Normal range of motion. Neck supple.  Cardiovascular: Normal rate, regular rhythm and normal heart sounds.   Respiratory: Effort normal and breath sounds normal. No respiratory distress. He has no wheezes. He has no rales.  GI: Soft. Bowel sounds are normal. He exhibits no distension. There is no tenderness. There is no rebound.  Musculoskeletal: Normal range of motion. He exhibits no edema and no tenderness.  Neurological: He is alert and oriented to person, place, and time. He has normal reflexes. No cranial nerve deficit. Coordination normal.  Skin: Skin is warm and dry. No rash noted. He is not diaphoretic. No erythema.  Psychiatric: His behavior is normal.     Assessment/Plan #1. Acute on chronic pancreatitis probably related to alcoholism - we'll keep patient n.p.o. IV hydrate, when necessary morphine for pain relief and CT abdomen and pelvis. #2. Diabetes mellitus type 2 - as patient n.p.o. will keep patient on sliding scale and small dose of Lantus. Once patient is able to tolerate orally we need to go back to his regular home  medication dose. Will check hemoglobin A1c. His blood sugar is uncontrolled at this time though he states he takes his medications regularly. We will closely follow his CBG. #3. History of alcoholism - place on alcohol withdrawal protocol. Will need counseling.  CODE STATUS - full code.  Argenis Kumari N. 05/28/2011, 12:51 AM

## 2011-05-29 LAB — CBC
MCV: 93.7 fL (ref 78.0–100.0)
Platelets: 256 10*3/uL (ref 150–400)
RBC: 3.79 MIL/uL — ABNORMAL LOW (ref 4.22–5.81)
WBC: 4.1 10*3/uL (ref 4.0–10.5)

## 2011-05-29 LAB — GLUCOSE, CAPILLARY
Glucose-Capillary: 93 mg/dL (ref 70–99)
Glucose-Capillary: 208 mg/dL — ABNORMAL HIGH (ref 70–99)

## 2011-05-29 LAB — COMPREHENSIVE METABOLIC PANEL
ALT: 7 U/L (ref 0–53)
AST: 16 U/L (ref 0–37)
Alkaline Phosphatase: 80 U/L (ref 39–117)
CO2: 27 mEq/L (ref 19–32)
Chloride: 104 mEq/L (ref 96–112)
GFR calc non Af Amer: >90 mL/min (ref >90)
Sodium: 136 mEq/L (ref 135–145)
Total Bilirubin: 0.8 mg/dL (ref 0.3–1.2)

## 2011-05-29 MED ORDER — MUPIROCIN 2 % EX OINT
1.0000 "application " | TOPICAL_OINTMENT | Freq: Two times a day (BID) | CUTANEOUS | Status: DC
  Administered 2011-05-29 – 2011-05-30 (×3): 1 via NASAL
  Filled 2011-05-29: qty 22

## 2011-05-29 MED ORDER — CHLORHEXIDINE GLUCONATE CLOTH 2 % EX PADS
6.0000 | MEDICATED_PAD | Freq: Every day | CUTANEOUS | Status: DC
  Administered 2011-05-29 – 2011-05-30 (×2): 6 via TOPICAL

## 2011-05-29 NOTE — Consult Note (Signed)
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 He has a history of heavy ETOH consumption. Over the past weekend while watching football he drank a lot. Next day he developed upper abdominal pain. He came to ER and subsequently admitted with a diagnosis of acute on chronic pancreatitis.He states that he had an episode of pancreatitis 2 years ago that was treated as an outpatient. He has been treated symptomatically here and today feels better and has no further abdominal pain. A CT abdomen was done which shows several abnormalities which include a newly atrophic pancreas, peripancreatic edema, portal venous hypertension, gastric varices, splenic vein insufficiency with question of chronic thrombosis,ascites, left lower lobe lung nodule. It was recommended by radiology to do an MRI to further evaluate athe abdominal findings and a repeat CT chest in 6-12 months for the lung nodule. His primary care doctor works at the [employer] in [place]. Lipase today 200.  Review of Systems  no jaundice, no chest pain or dyspnea, no GI bleeding  Past Medical History  Diagnosis Date  . Diabetes mellitus   . Meningitis   . Pneumonia   . GERD (gastroesophageal reflux disease)   . Pancreatitis    Past Surgical History  Procedure Date  . No past surgeries    History   Social History  . Marital Status: Single    Spouse Name: N/A    Number of Children: N/A  . Years of Education: N/A   Occupational History  . Not on file.   Social History Main Topics  . Smoking status: Never Smoker   . Smokeless tobacco: Never Used  . Alcohol Use: Yes     Drinks wiskey daily  . Drug Use: No  . Sexually Active: Yes   Other Topics Concern  . Not on file   Social History Narrative  . No narrative on file   family history includes Heart failure in his mother. Current facility-administered medications:acetaminophen (TYLENOL) suppository 650 mg, 650  mg, Rectal, Q6H PRN, Eduard Clos;  acetaminophen (TYLENOL) tablet 650 mg, 650 mg, Oral, Q6H PRN, Eduard Clos;  Chlorhexidine Gluconate Cloth 2 % PADS 6 each, 6 each, Topical, Q0600, Isidor Holts, 6 each at 05/29/11 1150;  folic acid (FOLVITE) tablet 1 mg, 1 mg, Oral, Daily, Christopher Oti, 1 mg at 05/29/11 1133 insulin aspart (novoLOG) injection 0-15 Units, 0-15 Units, Subcutaneous, Q4H, Christopher Oti, 5 Units at 05/29/11 1242;  insulin glargine (LANTUS) injection 5 Units, 5 Units, Subcutaneous, Daily, Eduard Clos, 5 Units at 05/29/11 1149;  LORazepam (ATIVAN) injection 1 mg, 1 mg, Intravenous, Q6H PRN, Eduard Clos;  LORazepam (ATIVAN) tablet 1 mg, 1 mg, Oral, Q6H PRN, Eduard Clos morphine 2 MG/ML injection 1 mg, 1 mg, Intravenous, Q2H PRN, Eduard Clos;  mulitivitamin with minerals tablet 1 tablet, 1 tablet, Oral, Daily, Eduard Clos, 1 tablet at 05/29/11 1133;  mupirocin ointment (BACTROBAN) 2 % 1 application, 1 application, Nasal, BID, Isidor Holts, 1 application at 05/29/11 1146;  ondansetron (ZOFRAN) injection 4 mg, 4 mg, Intravenous, Q6H PRN, Arshad N. Kakrakandy ondansetron (ZOFRAN) tablet 4 mg, 4 mg, Oral, Q6H PRN, Eduard Clos;  pantoprazole (PROTONIX) injection 40 mg, 40 mg, Intravenous, Q24H, Christopher Oti, 40 mg at 05/29/11 4098;  thiamine (B-1) injection 100 mg, 100 mg, Intravenous, Daily, Eduard Clos;  thiamine (VITAMIN B-1) tablet 100 mg, 100 mg, Oral, Daily, Arshad N. Kakrakandy, 100 mg at  05/29/11 1133 No Known Allergies   Objective:     BP 136/87  Pulse 82  Temp(Src) 98.4 F (36.9 C) (Axillary)  Resp 18  Ht 5\' 7"  (1.702 m)  Wt 65.772 kg (145 lb)  BMI 22.71 kg/m2  SpO2 100%  In no distress, Alert,  Non icteric Heart RRR no murmur Lungs clear Abdomen normal bowel sounds, soft, not distended, not tender Extremities without edema  Laboratory No components found with this basename: d1       Assessment:     Acute pancreatitis due to alcohol Portal hypertension with gastric varices and ascites Splenic vein insufficiency with question of thrombosis (chronic)   Lung nodule Alcohol overuse   Plan:     He is clinically improved in regards to the pancreatitis, without abdominal pain now. If he can tolerate some advancement of diet tomorrow he can probably go home and follow up with [place] and his PCP as an outpatient.We can do a MRI as an outpatient to exclude a pancreatic mass and also to further check as to whether or not he has a chronic thrombosis. He need to avoid ETOH and I told him this. He needs to make sure his PCP follows up on the lung nodule or refer to pulmonary to follow that.     Component Value Date/Time   WBC 4.1 05/29/2011 0529   HGB 12.5* 05/29/2011 0529   HCT 35.5* 05/29/2011 0529   PLT 256 05/29/2011 0529   ALT 7 05/29/2011 0529   AST 16 05/29/2011 0529   NA 136 05/29/2011 0529   K 3.9 05/29/2011 0529   CL 104 05/29/2011 0529   CREATININE 0.72 05/29/2011 0529   BUN 5* 05/29/2011 0529   CO2 27 05/29/2011 0529   CALCIUM 8.4 05/29/2011 0529   PHOS 3.8 07/28/2008 0345   ALKPHOS 80 05/29/2011 0529

## 2011-05-29 NOTE — Progress Notes (Signed)
Inpatient Diabetes Program Recommendations  AACE/ADA: New Consensus Statement on Inpatient Glycemic Control (2009)  Target Ranges:  Prepandial:   less than 140 mg/dL      Peak postprandial:   less than 180 mg/dL (1-2 hours)      Critically ill patients:  140 - 180 mg/dL   Reason for Visit: CBGs  05/28/11  209-179     05/29/11   [phone]  Inpatient Diabetes Program Recommendations Insulin - Basal: Increase Lantus to 12 units daily if CBGs continue greater than 180 mg/dl. Takes Lantus 28 units daily at home.  Note:

## 2011-05-29 NOTE — Progress Notes (Signed)
Subjective: No abdominal pain, wants to eat. No diarrhea. No new issues.  Objective: Vital signs in last 24 hours: Temp:  [98.2 F (36.8 C)-98.4 F (36.9 C)] 98.2 F (36.8 C) (12/20 0441) Pulse Rate:  [85-91] 85  (12/20 0650) Resp:  [18-20] 18  (12/20 0441) BP: (121-124)/(78-83) 121/83 mmHg (12/20 0650) SpO2:  [100 %] 100 % (12/20 0441) Weight:  [65.772 kg (145 lb)] 145 lb (65.772 kg) (12/20 0441) Weight change: 1.996 kg (4 lb 6.4 oz) Last BM Date: 05/29/11 (1 formed, 1 loose)  Intake/Output from previous day: 12/19 0701 - 12/20 0700 In: 2635.4 [I.V.:2635.4] Out: 1400 [Urine:1400] Total I/O In: -  Out: 2 [Stool:2]   Physical Exam: General: Comfortable, alert, communicative, fully oriented, not short of breath at rest.  HEENT:  No clinical pallor, no jaundice, no conjunctival injection or discharge. NECK:  Supple, JVP not seen, no carotid bruits, no palpable lymphadenopathy, no palpable goiter. CHEST:  Clinically clear to auscultation, no wheezes, no crackles. HEART:  Sounds 1 and 2 heard, normal, regular, no murmurs. ABDOMEN:  Full, soft, non-tender, no palpable organomegaly, no palpable masses, normal bowel sounds. GENITALIA:  Not examined. LOWER EXTREMITIES:  No pitting edema, palpable peripheral pulses. MUSCULOSKELETAL SYSTEM:  Generalized osteoarthritic changes, otherwise, normal. CENTRAL NERVOUS SYSTEM:  No focal neurologic deficit on gross examination.  Lab Results:  [facility] 05/29/11 0529 05/28/11 0458  WBC 4.1 5.0  HGB 12.5* 12.9*  HCT 35.5* 36.8*  PLT 256 245    Basename 05/29/11 0529 05/28/11 0458  NA 136 135  K 3.9 3.1*  CL 104 99  CO2 27 28  GLUCOSE 84 164*  BUN 5* 10  CREATININE 0.72 0.78  CALCIUM 8.4 8.4   Recent Results (from the past 240 hour(s))  MRSA PCR SCREENING     Status: Abnormal   Collection Time   05/28/11  1:15 PM      Component Value Range Status Comment   MRSA by PCR POSITIVE (*) NEGATIVE  Final      Studies/Results: Ct  Abdomen Pelvis W Contrast  05/28/2011  *RADIOLOGY REPORT*  Clinical Data: Abdominal pain.  Recurrent pancreatitis.  CT ABDOMEN AND PELVIS WITH CONTRAST  Technique:  Multidetector CT imaging of the abdomen and pelvis was performed following the standard protocol during bolus administration of intravenous contrast.  Contrast: 80mL OMNIPAQUE IOHEXOL 300 MG/ML IV SOLN  Comparison: Acute abdomen series of earlier today.  Prior CT of 08/05/2004.  Findings: Mild right base scarring or atelectasis.  A vague left lower lobe nodule measures 6 mm on image 2 and was not definitely present on the prior study of 08/05/2004.  Not well evaluated on 07/24/2008 CT secondary to airspace disease in this area.  Normal heart size without pericardial or pleural effusion.  Mild prominence of the caudate lobe, without specific evidence of cirrhosis.  A small splenule.  There is gastric underdistension. Mild wall thickening is suspected, including within the antrum on image 21.  The pancreas is newly atrophic since 2006.  Mild hyperenhancement and ill definition of pancreatic morphology on image 19 of series 2 involving the body.  Mild peripancreatic edema, most consistent with acute on chronic pancreatitis.  No peripancreatic fluid collection. No well-defined pancreatic head mass is identified in the region of the splenoportal confluence chronic thrombus.  There is no biliary ductal dilatation.  Normal gallbladder, biliary tract.  The main and branch portal veins are intact.  There are collaterals within the gastroepiploic distribution, consistent with chronic splenic vein insufficiency. A  diminutive splenic vein is seen on image 28.   There are gastric varices on image 14. The splenoportal confluence is not well visualized, and chronic thrombus at this level is suspected (image 25 of series 2).  Normal right adrenal gland.  Mild left adrenal thickening without well-defined mass.  No arterial complication for pancreatitis.  Circumaortic  left renal vein. No retroperitoneal or retrocrural adenopathy.  Normal colon, appendix, and terminal ileum.  Small bowel loops are normal in caliber.  No pneumatosis or free intraperitoneal air. Moderate volume abdominal pelvic ascites.  Omental edema is felt to be secondary to venous congestion/portal venous hypertension.  Right greater than left bilateral fluid containing inguinal hernias. No pelvic adenopathy.  Normal urinary bladder.  Mild prostatomegaly.  Left sacroiliac joint degenerative partial fusion. Degenerative disc disease at L2-L3 is advanced.  There is grade 1 L5-S1 anterolisthesis.  A left-sided pars defect at L5.  IMPRESSION:  1.  Findings of acute on chronic pancreatitis. 2.  Chronic splenic vein insufficiency with a lack of visualization of the splenoportal confluence, suspicious for chronic thrombus. Secondary collaterals in the gastroepiploic distribution with gastric varices. 3. Moderate volume abdominal pelvic ascites is likely secondary to portal venous hypertension. 4. New pancreatic atrophy with well-defined mass in the region of the presumed splenoportal confluence thrombosis.  Consider pre and post contrast abdominal MRI, when the patient is recovered from the acute episode (preferably as an outpatient) to exclude underlying pancreatic mass with secondary pancreatitis.  Absence of pancreatic or biliary ductal dilatation argues against underlying carcinoma. 5.  Fluid containing bilateral inguinal hernias. 6.  Left-sided pars defects with grade 1 L5-S1 anterolisthesis. 7.  6 mm left lower lobe lung nodule. If the patient is at high risk for bronchogenic carcinoma, follow-up chest CT at 6-12 months is recommended.  If the patient is at low risk for bronchogenic carcinoma, follow-up chest CT at 12 months is recommended.  This recommendation follows the consensus statement: "Guidelines for Management of Small Pulmonary Nodules Detected on CT Scans: A Statement from the Fleischner Society" as  published in Radiology 2005; 237:395-400. Online at: DietDisorder.cz.  Original Report Authenticated By: Consuello Bossier, M.D.   Dg Abd Acute W/chest  05/28/2011  *RADIOLOGY REPORT*  Clinical Data: Abdominal pain, history pancreatitis.  ACUTE ABDOMEN SERIES (ABDOMEN 2 VIEW & CHEST 1 VIEW)  Comparison: 07/25/2008  Findings: Interstitial prominence and mild lung base opacities. Cardiomediastinal contours within normal limits.  Nonobstructive bowel gas pattern.  No free intraperitoneal air. Organ outlines are normal where seen.  Degenerative changes without acute osseous abnormality.  IMPRESSION: Nonobstructive bowel gas pattern.  Mild lung base opacities may represent atelectasis, scarring, or infiltrate.  Original Report Authenticated By: Waneta Martins, M.D.    Medications: Scheduled Meds:    . Chlorhexidine Gluconate Cloth  6 each Topical Q0600  . folic acid  1 mg Oral Daily  . insulin aspart  0-15 Units Subcutaneous Q4H  . insulin glargine  5 Units Subcutaneous Daily  . mulitivitamin with minerals  1 tablet Oral Daily  . mupirocin ointment  1 application Nasal BID  . pantoprazole (PROTONIX) IV  40 mg Intravenous Q24H  . thiamine  100 mg Oral Daily   Or  . thiamine  100 mg Intravenous Daily   Continuous Infusions:  PRN Meds:.acetaminophen, acetaminophen, iohexol, LORazepam, LORazepam, morphine, ondansetron (ZOFRAN) IV, ondansetron  Assessment/Plan:  Principal Problem:  *Pancreatitis: This is acute on chronic, secondary to ETOH abuse. Improving clinically. We will advance diet to low fat, regular,  today. Lipase is still trending down. Active Problems:  1. Diabetes mellitus: Reasonably controlled. Continue SSI. HBA1C is 7.8. Patent claims compliance with home meds. 2. ETOH abuse: Last drink was on 05/25/11. No evidence of withdrawal phenomena at this time. Continue vitamin supplements. Patient has been counseled.  Comment: Abdominal Ct scan of  05/28/11, shows very concerning abnormalities, in addition to acute-on chronic pancreatitis. Will need GI input, to determine further management. Have requested a consultation from Dr Matthias Hughs et al.  Disp: If GI concurs, will probably aim discharge on 05/30/11.  LOS: 2 days   Melvin [name],CHRISTOPHER 05/29/2011, 3:14 PM

## 2011-05-30 DIAGNOSIS — R911 Solitary pulmonary nodule: Secondary | ICD-10-CM | POA: Diagnosis present

## 2011-05-30 LAB — COMPREHENSIVE METABOLIC PANEL
ALT: 7 U/L (ref 0–53)
CO2: 26 mEq/L (ref 19–32)
Calcium: 8.7 mg/dL (ref 8.4–10.5)
Creatinine, Ser: 0.64 mg/dL (ref 0.50–1.35)
GFR calc Af Amer: >90 mL/min (ref >90)
GFR calc non Af Amer: >90 mL/min (ref >90)
Glucose, Bld: 155 mg/dL — ABNORMAL HIGH (ref 70–99)
Sodium: 134 mEq/L — ABNORMAL LOW (ref 135–145)
Total Protein: 5.6 g/dL — ABNORMAL LOW (ref 6.0–8.3)

## 2011-05-30 LAB — GLUCOSE, CAPILLARY
Glucose-Capillary: 148 mg/dL — ABNORMAL HIGH (ref 70–99)
Glucose-Capillary: 191 mg/dL — ABNORMAL HIGH (ref 70–99)

## 2011-05-30 LAB — LIPASE, BLOOD: Lipase: 466 U/L — ABNORMAL HIGH (ref 11–59)

## 2011-05-30 MED ORDER — FOLIC ACID 1 MG PO TABS: 1.0000 mg | ORAL_TABLET | Freq: Every day | ORAL | Status: AC

## 2011-05-30 MED ORDER — THIAMINE HCL 100 MG PO TABS: 100.0000 mg | ORAL_TABLET | Freq: Every day | ORAL | Status: AC

## 2011-05-30 MED ORDER — HYDROCODONE-ACETAMINOPHEN 5-500 MG PO TABS: 1.0000 | ORAL_TABLET | Freq: Four times a day (QID) | ORAL | Status: AC | PRN

## 2011-05-30 MED ORDER — PANTOPRAZOLE SODIUM 40 MG PO TBEC: 40.0000 mg | DELAYED_RELEASE_TABLET | Freq: Every day | ORAL | Status: DC

## 2011-05-30 MED ORDER — ADULT MULTIVITAMIN W/MINERALS CH: 1.0000 | ORAL_TABLET | Freq: Every day | ORAL | Status: DC

## 2011-05-30 NOTE — Discharge Summary (Signed)
Physician Discharge Summary  Patient ID: Melvin [name] MRN: [id number] DOB/AGE: 52-Apr-1962 52 y.o.  Admit date: 05/27/2011 Discharge date: 05/30/2011  Primary Care Physician:  Evlyn Courier, MD, MD   Discharge Diagnoses:    Patient Active Problem List  Diagnoses  . Pancreatitis  . Diabetes mellitus  . Pulmonary nodule    Current Discharge Medication List    START taking these medications   Details  folic acid (FOLVITE) 1 MG tablet Take 1 tablet (1 mg total) by mouth daily. Qty: 30 tablet, Refills: 0    HYDROcodone-acetaminophen (VICODIN) 5-500 MG per tablet Take 1 tablet by mouth every 6 (six) hours as needed for pain. Qty: 28 tablet, Refills: 0    Multiple Vitamin (MULITIVITAMIN WITH MINERALS) TABS Take 1 tablet by mouth daily. Qty: 30 tablet, Refills: 0    pantoprazole (PROTONIX) 40 MG tablet Take 1 tablet (40 mg total) by mouth daily. Qty: 30 tablet, Refills: 0    thiamine 100 MG tablet Take 1 tablet (100 mg total) by mouth daily. Qty: 30 tablet, Refills: 0      CONTINUE these medications which have NOT CHANGED   Details  glipiZIDE (GLUCOTROL) 10 MG tablet Take 20 mg by mouth daily.      insulin glargine (LANTUS) 100 UNIT/ML injection Inject 28 Units into the skin at bedtime.      insulin glulisine (APIDRA) 100 UNIT/ML injection Inject 6 Units into the skin daily after breakfast.      metFORMIN (GLUCOPHAGE) 1000 MG tablet Take 1,000 mg by mouth 2 (two) times daily with a meal.           Disposition and Follow-up: Follow up with primary MD within 1-2 weeks of discharge,, and follow up with Dr Herbert Moors, Gastroenterologist.  Consults:  GI  Dr Evette Cristal.  Significant Diagnostic Studies:  Ct Abdomen Pelvis W Contrast  05/28/2011  *RADIOLOGY REPORT*  Clinical Data: Abdominal pain.  Recurrent pancreatitis.  CT ABDOMEN AND PELVIS WITH CONTRAST  Technique:  Multidetector CT imaging of the abdomen and pelvis was performed following the standard protocol  during bolus administration of intravenous contrast.  Contrast: 80mL OMNIPAQUE IOHEXOL 300 MG/ML IV SOLN  Comparison: Acute abdomen series of earlier today.  Prior CT of 08/05/2004.  Findings: Mild right base scarring or atelectasis.  A vague left lower lobe nodule measures 6 mm on image 2 and was not definitely present on the prior study of 08/05/2004.  Not well evaluated on 07/24/2008 CT secondary to airspace disease in this area.  Normal heart size without pericardial or pleural effusion.  Mild prominence of the caudate lobe, without specific evidence of cirrhosis.  A small splenule.  There is gastric underdistension. Mild wall thickening is suspected, including within the antrum on image 21.  The pancreas is newly atrophic since 2006.  Mild hyperenhancement and ill definition of pancreatic morphology on image 19 of series 2 involving the body.  Mild peripancreatic edema, most consistent with acute on chronic pancreatitis.  No peripancreatic fluid collection. No well-defined pancreatic head mass is identified in the region of the splenoportal confluence chronic thrombus.  There is no biliary ductal dilatation.  Normal gallbladder, biliary tract.  The main and branch portal veins are intact.  There are collaterals within the gastroepiploic distribution, consistent with chronic splenic vein insufficiency. A diminutive splenic vein is seen on image 28.   There are gastric varices on image 14. The splenoportal confluence is not well visualized, and chronic thrombus at this level is suspected (image  25 of series 2).  Normal right adrenal gland.  Mild left adrenal thickening without well-defined mass.  No arterial complication for pancreatitis.  Circumaortic left renal vein. No retroperitoneal or retrocrural adenopathy.  Normal colon, appendix, and terminal ileum.  Small bowel loops are normal in caliber.  No pneumatosis or free intraperitoneal air. Moderate volume abdominal pelvic ascites.  Omental edema is felt to be  secondary to venous congestion/portal venous hypertension.  Right greater than left bilateral fluid containing inguinal hernias. No pelvic adenopathy.  Normal urinary bladder.  Mild prostatomegaly.  Left sacroiliac joint degenerative partial fusion. Degenerative disc disease at L2-L3 is advanced.  There is grade 1 L5-S1 anterolisthesis.  A left-sided pars defect at L5.  IMPRESSION:  1.  Findings of acute on chronic pancreatitis. 2.  Chronic splenic vein insufficiency with a lack of visualization of the splenoportal confluence, suspicious for chronic thrombus. Secondary collaterals in the gastroepiploic distribution with gastric varices. 3. Moderate volume abdominal pelvic ascites is likely secondary to portal venous hypertension. 4. New pancreatic atrophy with well-defined mass in the region of the presumed splenoportal confluence thrombosis.  Consider pre and post contrast abdominal MRI, when the patient is recovered from the acute episode (preferably as an outpatient) to exclude underlying pancreatic mass with secondary pancreatitis.  Absence of pancreatic or biliary ductal dilatation argues against underlying carcinoma. 5.  Fluid containing bilateral inguinal hernias. 6.  Left-sided pars defects with grade 1 L5-S1 anterolisthesis. 7.  6 mm left lower lobe lung nodule. If the patient is at high risk for bronchogenic carcinoma, follow-up chest CT at 6-12 months is recommended.  If the patient is at low risk for bronchogenic carcinoma, follow-up chest CT at 12 months is recommended.  This recommendation follows the consensus statement: "Guidelines for Management of Small Pulmonary Nodules Detected on CT Scans: A Statement from the Fleischner Society" as published in Radiology 2005; 237:395-400. Online at: DietDisorder.cz.  Original Report Authenticated By: Consuello Bossier, M.D.   Dg Abd Acute W/chest  05/28/2011  *RADIOLOGY REPORT*  Clinical Data: Abdominal pain, history  pancreatitis.  ACUTE ABDOMEN SERIES (ABDOMEN 2 VIEW & CHEST 1 VIEW)  Comparison: 07/25/2008  Findings: Interstitial prominence and mild lung base opacities. Cardiomediastinal contours within normal limits.  Nonobstructive bowel gas pattern.  No free intraperitoneal air. Organ outlines are normal where seen.  Degenerative changes without acute osseous abnormality.  IMPRESSION: Nonobstructive bowel gas pattern.  Mild lung base opacities may represent atelectasis, scarring, or infiltrate.  Original Report Authenticated By: Waneta Martins, M.D.    Brief H and P: For complete details, refer to admission H and P. However, in brief, this is a 50 year old male, with known history of recurrent pancreatitis from alcoholism, presenting to the ER with constant, epigastric, non-radiating abdominal pain of approximately 2 days duration, associated with a few episodes of diarrhea, following alcohol ingestion. Initial laboratory tests, showed elevated lipase. Patient was admitted for further evaluation, investigation and management.  Physical Exam: On 05/30/11. General: Comfortable, alert, communicative, fully oriented, not short of breath at rest.  HEENT: No clinical pallor, no jaundice, no conjunctival injection or discharge.  NECK: Supple, JVP not seen, no carotid bruits, no palpable lymphadenopathy, no palpable goiter.  CHEST: Clinically clear to auscultation, no wheezes, no crackles.  HEART: Sounds 1 and 2 heard, normal, regular, no murmurs.  ABDOMEN: Full, soft, non-tender, no palpable organomegaly, no palpable masses, normal bowel sounds.  GENITALIA: Not examined.  LOWER EXTREMITIES: No pitting edema, palpable peripheral pulses.  MUSCULOSKELETAL  SYSTEM: Generalized osteoarthritic changes, otherwise, normal.  CENTRAL NERVOUS SYSTEM: No focal neurologic deficit on gross examination.  Hospital Course:  Principal Problem:  *Pancreatitis: This is acute on chronic, secondary to ETOH abuse. Patient was  managed with bowel rest, PPI, intravenous fluids, and analgesics. By 05/28/11, he was already requesting food, tolerated clears, and by 05/29/11, was advanced to regular,  Low fat diet, without exacerbation of abdominal pain. Active Problems:  1. Diabetes mellitus: Reasonably controlled. This was managed with appropriate diet and SSI, during this hospitalization. HBA1C is 7.8. Patent claims compliance with home meds.  2. ETOH abuse: Last drink was on 05/25/11. Patient showed no evidence of withdrawal phenomena. He was placed on vitamin supplements and has been counseled.  3. Possible pancreatic mass: Abdominal Ct scan of 05/28/11, showed very concerning abnormalities, including a newly atrophic pancreas, peripancreatic edema, portal venous hypertension, gastric varices, splenic vein insufficiency with question of chronic thrombosis,ascites, possible pancreatic mass and left lower lobe lung nodule. Dr Herbert Moors, Gastroenterologist was consulted. He has recommended outpatient GI follow up with MRI,  to exclude a pancreatic mass and also to further check as to whether or not he has a chronic thrombosis. 4. Pulmonary nodule: This was an incidental finding on abdominal CT scan. Follow up Ct is recommended in 6 months. We shall defer to patient's primary MD.  Comment: Patient was asymptomatic and stable for discharge on 05/30/11.  Time spent on Discharge: 45 mins.  Signed: Saivon Prowse,CHRISTOPHER 05/30/2011, 1:56 PM

## 2011-05-30 NOTE — Progress Notes (Signed)
Utilization review completed. Melvin [name] 05/30/2011

## 2011-07-25 ENCOUNTER — Other Ambulatory Visit: Payer: Self-pay | Admitting: Family Medicine

## 2011-07-25 ENCOUNTER — Ambulatory Visit
Admission: RE | Admit: 2011-07-25 | Discharge: 2011-07-25 | Disposition: A | Discharge status: Home / Self Care | Payer: BC Managed Care – PPO | Source: Ambulatory Visit | Attending: Family Medicine | Admitting: Family Medicine

## 2011-07-25 DIAGNOSIS — R1900 Intra-abdominal and pelvic swelling, mass and lump, unspecified site: Secondary | ICD-10-CM

## 2011-07-28 ENCOUNTER — Other Ambulatory Visit: Payer: Self-pay

## 2011-07-28 ENCOUNTER — Inpatient Hospital Stay (HOSPITAL_COMMUNITY)
Admission: EM | Admit: 2011-07-28 | Discharge: 2011-08-08 | DRG: 557 | Disposition: A | Discharge status: Home / Self Care | Payer: BC Managed Care – PPO | Attending: Family Medicine | Admitting: Family Medicine

## 2011-07-28 ENCOUNTER — Emergency Department (HOSPITAL_COMMUNITY): Payer: BC Managed Care – PPO

## 2011-07-28 ENCOUNTER — Encounter (HOSPITAL_COMMUNITY): Payer: Self-pay

## 2011-07-28 DIAGNOSIS — Z7982 Long term (current) use of aspirin: Secondary | ICD-10-CM

## 2011-07-28 DIAGNOSIS — E119 Type 2 diabetes mellitus without complications: Secondary | ICD-10-CM | POA: Diagnosis present

## 2011-07-28 DIAGNOSIS — D7389 Other diseases of spleen: Secondary | ICD-10-CM | POA: Diagnosis present

## 2011-07-28 DIAGNOSIS — F1010 Alcohol abuse, uncomplicated: Secondary | ICD-10-CM | POA: Diagnosis present

## 2011-07-28 DIAGNOSIS — K859 Acute pancreatitis without necrosis or infection, unspecified: Principal | ICD-10-CM | POA: Diagnosis present

## 2011-07-28 DIAGNOSIS — R911 Solitary pulmonary nodule: Secondary | ICD-10-CM

## 2011-07-28 DIAGNOSIS — K5900 Constipation, unspecified: Secondary | ICD-10-CM | POA: Diagnosis present

## 2011-07-28 DIAGNOSIS — R188 Other ascites: Secondary | ICD-10-CM | POA: Diagnosis present

## 2011-07-28 DIAGNOSIS — E43 Unspecified severe protein-calorie malnutrition: Secondary | ICD-10-CM | POA: Diagnosis present

## 2011-07-28 DIAGNOSIS — T4275XA Adverse effect of unspecified antiepileptic and sedative-hypnotic drugs, initial encounter: Secondary | ICD-10-CM | POA: Diagnosis present

## 2011-07-28 DIAGNOSIS — K861 Other chronic pancreatitis: Secondary | ICD-10-CM | POA: Diagnosis present

## 2011-07-28 HISTORY — DX: Angina pectoris, unspecified: I20.9

## 2011-07-28 LAB — COMPREHENSIVE METABOLIC PANEL
Albumin: 2 g/dL — ABNORMAL LOW (ref 3.5–5.2)
Alkaline Phosphatase: 128 U/L — ABNORMAL HIGH (ref 39–117)
BUN: 3 mg/dL — ABNORMAL LOW (ref 6–23)
Creatinine, Ser: 0.64 mg/dL (ref 0.50–1.35)
GFR calc Af Amer: >90 mL/min (ref >90)
Glucose, Bld: 302 mg/dL — ABNORMAL HIGH (ref 70–99)
Potassium: 3.1 mEq/L — ABNORMAL LOW (ref 3.5–5.1)
Total Protein: 6.6 g/dL (ref 6.0–8.3)

## 2011-07-28 LAB — GLUCOSE, CAPILLARY: Glucose-Capillary: 206 mg/dL — ABNORMAL HIGH (ref 70–99)

## 2011-07-28 LAB — DIFFERENTIAL
Basophils Relative: 1 % (ref 0–1)
Eosinophils Absolute: 0.1 10*3/uL (ref 0.0–0.7)
Eosinophils Relative: 2 % (ref 0–5)
Lymphs Abs: 0.8 10*3/uL (ref 0.7–4.0)
Monocytes Absolute: 0.9 10*3/uL (ref 0.1–1.0)
Monocytes Relative: 21 % — ABNORMAL HIGH (ref 3–12)

## 2011-07-28 LAB — URINALYSIS, ROUTINE W REFLEX MICROSCOPIC
Bilirubin Urine: NEGATIVE
Ketones, ur: 15 mg/dL — AB
Leukocytes, UA: NEGATIVE
Nitrite: NEGATIVE
Specific Gravity, Urine: 1.02 (ref 1.005–1.030)
Urobilinogen, UA: 1 mg/dL (ref 0.0–1.0)

## 2011-07-28 LAB — CREATININE, SERUM
Creatinine, Ser: 0.61 mg/dL (ref 0.50–1.35)
GFR calc non Af Amer: >90 mL/min (ref >90)

## 2011-07-28 LAB — CBC
HCT: 45.5 % (ref 39.0–52.0)
Hemoglobin: 16.5 g/dL (ref 13.0–17.0)
MCH: 32.6 pg (ref 26.0–34.0)
MCHC: 36.3 g/dL — ABNORMAL HIGH (ref 30.0–36.0)
MCHC: 34.6 g/dL (ref 30.0–36.0)
MCV: 89.9 fL (ref 78.0–100.0)
RBC: 5.06 MIL/uL (ref 4.22–5.81)
RDW: 12.6 % (ref 11.5–15.5)

## 2011-07-28 LAB — LIPASE, BLOOD: Lipase: 851 U/L — ABNORMAL HIGH (ref 11–59)

## 2011-07-28 LAB — MAGNESIUM: Magnesium: 1.5 mg/dL (ref 1.5–2.5)

## 2011-07-28 LAB — URINE MICROSCOPIC-ADD ON

## 2011-07-28 MED ORDER — POTASSIUM CHLORIDE 10 MEQ/100ML IV SOLN
10.0000 meq | INTRAVENOUS | Status: AC
  Administered 2011-07-28 (×3): 10 meq via INTRAVENOUS
  Filled 2011-07-28 (×3): qty 100

## 2011-07-28 MED ORDER — SODIUM CHLORIDE 0.9 % IV BOLUS (SEPSIS)
1000.0000 mL | Freq: Once | INTRAVENOUS | Status: AC
  Administered 2011-07-28: 1000 mL via INTRAVENOUS

## 2011-07-28 MED ORDER — ONDANSETRON HCL 4 MG/2ML IJ SOLN
4.0000 mg | Freq: Four times a day (QID) | INTRAMUSCULAR | Status: DC | PRN
  Administered 2011-07-30 – 2011-08-07 (×6): 4 mg via INTRAVENOUS
  Filled 2011-07-28 (×6): qty 2

## 2011-07-28 MED ORDER — INSULIN ASPART 100 UNIT/ML ~~LOC~~ SOLN
0.0000 [IU] | Freq: Three times a day (TID) | SUBCUTANEOUS | Status: DC
  Filled 2011-07-28: qty 3

## 2011-07-28 MED ORDER — FOLIC ACID 5 MG/ML IJ SOLN
1.0000 mg | Freq: Once | INTRAMUSCULAR | Status: AC
  Administered 2011-07-28: 1 mg via INTRAVENOUS
  Filled 2011-07-28 (×2): qty 0.2

## 2011-07-28 MED ORDER — SODIUM CHLORIDE 0.9 % IV SOLN
INTRAVENOUS | Status: DC
  Administered 2011-07-28: 12:00:00 via INTRAVENOUS

## 2011-07-28 MED ORDER — CHLORHEXIDINE GLUCONATE 0.12 % MT SOLN
15.0000 mL | Freq: Two times a day (BID) | OROMUCOSAL | Status: DC
  Administered 2011-07-28 – 2011-07-30 (×3): 15 mL via OROMUCOSAL
  Filled 2011-07-28 (×3): qty 15

## 2011-07-28 MED ORDER — ONDANSETRON HCL 4 MG PO TABS: 4.0000 mg | ORAL_TABLET | Freq: Four times a day (QID) | ORAL | Status: DC | PRN

## 2011-07-28 MED ORDER — BIOTENE DRY MOUTH MT LIQD
15.0000 mL | Freq: Two times a day (BID) | OROMUCOSAL | Status: DC
  Administered 2011-07-28 – 2011-07-29 (×2): 15 mL via OROMUCOSAL

## 2011-07-28 MED ORDER — INSULIN ASPART 100 UNIT/ML ~~LOC~~ SOLN
0.0000 [IU] | SUBCUTANEOUS | Status: DC
  Administered 2011-07-28: 3 [IU] via SUBCUTANEOUS
  Administered 2011-07-29: 2 [IU] via SUBCUTANEOUS
  Administered 2011-07-29: 3 [IU] via SUBCUTANEOUS
  Filled 2011-07-28: qty 3

## 2011-07-28 MED ORDER — ENOXAPARIN SODIUM 40 MG/0.4ML ~~LOC~~ SOLN
40.0000 mg | SUBCUTANEOUS | Status: DC
  Administered 2011-07-28 – 2011-08-07 (×11): 40 mg via SUBCUTANEOUS
  Filled 2011-07-28 (×12): qty 0.4

## 2011-07-28 MED ORDER — MORPHINE SULFATE 2 MG/ML IJ SOLN
1.0000 mg | INTRAMUSCULAR | Status: DC | PRN
  Administered 2011-08-01 – 2011-08-03 (×5): 1 mg via INTRAVENOUS
  Filled 2011-07-28 (×5): qty 1

## 2011-07-28 MED ORDER — SODIUM CHLORIDE 0.9 % IV SOLN: 1.0000 mg | Freq: Once | INTRAVENOUS | Status: DC

## 2011-07-28 MED ORDER — SODIUM CHLORIDE 0.9 % IV SOLN
INTRAVENOUS | Status: AC
  Administered 2011-07-28 – 2011-08-02 (×9): via INTRAVENOUS

## 2011-07-28 MED ORDER — THIAMINE HCL 100 MG/ML IJ SOLN
100.0000 mg | Freq: Every day | INTRAMUSCULAR | Status: DC
  Administered 2011-07-28 – 2011-07-29 (×2): 100 mg via INTRAVENOUS
  Filled 2011-07-28 (×4): qty 1

## 2011-07-28 NOTE — ED Provider Notes (Signed)
History     CSN: [id number]  Arrival date & time 07/28/11  0756   First MD Initiated Contact with Patient 07/28/11 0809     HPI Melvin [name] is a.age male c/o abdominal distension due to his pancreatitis. States on Friday he saw Dr. Mirna Mires at the [facility] clinic. Was advised to come to the ED for abnormal labs. Pt reports having abdominal discomfort for 1 week. Describes discomfort as swelling and pressure but denies pain. States he is "swollen" form his epigastric to his toes. Denies n/v, urinary symptoms, fever.  Patient is a 51 y.o. male presenting with abdominal pain. The history is provided by the patient.  Abdominal Pain The primary symptoms of the illness do not include abdominal pain, fever, shortness of breath, nausea, vomiting, diarrhea or dysuria. The onset of the illness was gradual.  The illness is associated with alcohol use. Symptoms associated with the illness do not include chills, constipation, hematuria or back pain.    Past Medical History  Diagnosis Date  . Diabetes mellitus   . Meningitis   . Pneumonia   . GERD (gastroesophageal reflux disease)   . Pancreatitis     Past Surgical History  Procedure Date  . No past surgeries     Family History  Problem Relation Age of Onset  . Heart failure Mother     History  Substance Use Topics  . Smoking status: Never Smoker   . Smokeless tobacco: Never Used  . Alcohol Use: Yes     Drinks wiskey daily      Review of Systems  Constitutional: Negative for fever and chills.  Respiratory: Negative for shortness of breath.   Cardiovascular: Negative for chest pain.  Gastrointestinal: Positive for abdominal distention. Negative for nausea, vomiting, abdominal pain, diarrhea, constipation, blood in stool and rectal pain.  Genitourinary: Negative for dysuria, hematuria, flank pain, discharge, penile pain and testicular pain.  Musculoskeletal: Negative for back pain.  Neurological: Negative for dizziness,  weakness, numbness and headaches.  All other systems reviewed and are negative.    Allergies  Review of patient's allergies indicates no known allergies.  Home Medications   Current Outpatient Rx  Name Route Sig Dispense Refill  . FOLIC ACID 1 MG PO TABS Oral Take 1 tablet (1 mg total) by mouth daily. 30 tablet 0  . GLIPIZIDE 10 MG PO TABS Oral Take 20 mg by mouth daily.      . INSULIN GLARGINE 100 UNIT/ML [place] SOLN Subcutaneous Inject 28 Units into the skin at bedtime.      . INSULIN GLULISINE 100 UNIT/ML [place] SOLN Subcutaneous Inject 6 Units into the skin daily after breakfast.      . METFORMIN HCL 1000 MG PO TABS Oral Take 1,000 mg by mouth 2 (two) times daily with a meal.      . ADULT MULTIVITAMIN W/MINERALS CH Oral Take 1 tablet by mouth daily. 30 tablet 0  . PANTOPRAZOLE SODIUM 40 MG PO TBEC Oral Take 1 tablet (40 mg total) by mouth daily. 30 tablet 0  . THIAMINE HCL 100 MG PO TABS Oral Take 1 tablet (100 mg total) by mouth daily. 30 tablet 0    BP 123/95  Pulse 103  Temp(Src) 97.5 F (36.4 C) (Oral)  Resp 16  SpO2 97%  Physical Exam  Vitals reviewed. Constitutional: He is oriented to person, place, and time. He appears well-developed and well-nourished. No distress.  HENT:  Head: Normocephalic and atraumatic.  Eyes: Conjunctivae are normal. Pupils  are equal, round, and reactive to light.  Neck: Normal range of motion. Neck supple.  Cardiovascular: Normal rate, regular rhythm and normal heart sounds.  Exam reveals no gallop and no friction rub.   No murmur heard. Pulmonary/Chest: Effort normal and breath sounds normal. He has no wheezes. He has no rales. He exhibits no tenderness.  Abdominal: Soft. Bowel sounds are normal. He exhibits distension. He exhibits no mass. There is no tenderness. There is no rebound and no guarding.  Neurological: He is alert and oriented to person, place, and time.  Skin: Skin is warm and dry. No rash noted. No erythema. No pallor.    Psychiatric: He has a normal mood and affect. His behavior is normal.    ED Course  Procedures  Results for orders placed during the hospital encounter of 07/28/11  CBC      Component Value Range   WBC 4.2  4.0 - 10.5 (K/uL)   RBC 5.06  4.22 - 5.81 (MIL/uL)   Hemoglobin 16.5  13.0 - 17.0 (g/dL)   HCT 08.6  57.8 - 46.9 (%)   MCV 89.9  78.0 - 100.0 (fL)   MCH 32.6  26.0 - 34.0 (pg)   MCHC 36.3 (*) 30.0 - 36.0 (g/dL)   RDW 62.9  52.8 - 41.3 (%)   Platelets 324  150 - 400 (K/uL)  DIFFERENTIAL      Component Value Range   Neutrophils Relative 57  43 - 77 (%)   Neutro Abs 2.4  1.7 - 7.7 (K/uL)   Lymphocytes Relative 19  12 - 46 (%)   Lymphs Abs 0.8  0.7 - 4.0 (K/uL)   Monocytes Relative 21 (*) 3 - 12 (%)   Monocytes Absolute 0.9  0.1 - 1.0 (K/uL)   Eosinophils Relative 2  0 - 5 (%)   Eosinophils Absolute 0.1  0.0 - 0.7 (K/uL)   Basophils Relative 1  0 - 1 (%)   Basophils Absolute 0.0  0.0 - 0.1 (K/uL)  COMPREHENSIVE METABOLIC PANEL      Component Value Range   Sodium 137  135 - 145 (mEq/L)   Potassium 3.1 (*) 3.5 - 5.1 (mEq/L)   Chloride 100  96 - 112 (mEq/L)   CO2 28  19 - 32 (mEq/L)   Glucose, Bld 302 (*) 70 - 99 (mg/dL)   BUN 3 (*) 6 - 23 (mg/dL)   Creatinine, Ser 2.44  0.50 - 1.35 (mg/dL)   Calcium 8.8  8.4 - 01.0 (mg/dL)   Total Protein 6.6  6.0 - 8.3 (g/dL)   Albumin 2.0 (*) 3.5 - 5.2 (g/dL)   AST 15  0 - 37 (U/L)   ALT 6  0 - 53 (U/L)   Alkaline Phosphatase 128 (*) 39 - 117 (U/L)   Total Bilirubin 0.4  0.3 - 1.2 (mg/dL)   GFR calc non Af Amer >90  >90 (mL/min)   GFR calc Af Amer >90  >90 (mL/min)  LIPASE, BLOOD      Component Value Range   Lipase 851 (*) 11 - 59 (U/L)   Dg Abd 1 View  07/25/2011  *RADIOLOGY REPORT*  Clinical Data: Abdominal pain.  Swelling.  Diarrhea.  ABDOMEN - 1 VIEW  Comparison: Abdominal CT 05/28/2011.  Findings: Nonobstructive bowel gas pattern is present.  Moderate stool burden.  Levoconvex lumbar scoliosis.  The soft tissues of the  abdomen had an unusual pattern, likely related to pannus.  No intra-abdominal free air or organomegaly.  Stool and bowel  gas extends to the rectosigmoid. There is some centralization of bowel loops, raising the possibility of ascites.  IMPRESSION: Nonobstructive bowel gas pattern. Centralization of bowel loops suggesting ascites.  Original Report Authenticated By: Andreas Newport, M.D.   Dg Abd 2 Views  07/28/2011  *RADIOLOGY REPORT*  Clinical Data: Abdominal distention.  Constipation.  Diabetes.  ABDOMEN - 2 VIEW  Comparison: 07/25/2011  Findings: No evidence of dilated bowel loops.  No evidence of free air.  Question left colonic wall thickening in the left abdomen, which may be seen with colitis, versus incomplete distention by bowel gas.  No evidence of radiopaque calculi.  IMPRESSION: Nonobstructive bowel gas pattern.  Question left colonic wall thickening versus incomplete distention.  Original Report Authenticated By: Danae Orleans, M.D.     MDM   10:36 AM Pt reports a left sided chest pain.  ED ECG REPORT   Date: 07/28/2011  EKG Time: 11:40 AM  Rate: 89  Rhythm: sinus rhythm,  No significant changes since 07/22/2008  Axis: LAD   Intervals:none  ST&T Change: none  Narrative Interpretation: low voltage QRS          Patient reports he had labs done by his primary care physician and was recommended to come to patient's labs indicate he is having pancreatitis episode. Fluid trial and unable to tolerate. Will have patient admitted for pancreatitis.       Thomasene Lot, PA-C 07/28/11 1141

## 2011-07-28 NOTE — ED Notes (Signed)
Patient given warm blanket.

## 2011-07-28 NOTE — ED Notes (Signed)
Patient returned from xray, urine sample obtained and sent to lab

## 2011-07-28 NOTE — ED Notes (Signed)
Patient sent here by md, had blood drawn last week, informed this am by md that he had pancreatitis, pt complains of swelling, abd noted with a lot swelling and also sts ankle swelling, complains ofa bd pain

## 2011-07-28 NOTE — H&P (Signed)
PCP:   Evlyn Courier, MD, MD   Chief Complaint:  Abdominal pain and swelling, n/v  HPI: 51 y/o black man with h/o DM and ETOH abuse, comes frequently with episodes of acute on chronic pancreatitis. For the past week has been having abdominal pain in the epigastric area as well as increased abdominal girth. Went to see his PCP on Friday who drew labs and asked him to come to the ED today because the labs were abnormal. Found to have a lipase of over 800 and was not able to tolerate liquids in the ED, so we are asked to admit him for further evaluation and management. Of note, a CT scan done in 12/12 showed a possibility of a pancreatic mass and a ?splenic vein thrombosis for which it was recommended that he have an outpatient MRI, however he never followed up for that issue.  Allergies:  No Known Allergies    Past Medical History  Diagnosis Date  . Diabetes mellitus   . Meningitis   . Pneumonia   . GERD (gastroesophageal reflux disease)   . Pancreatitis     Past Surgical History  Procedure Date  . No past surgeries     Prior to Admission medications   Medication Sig Start Date End Date Taking? Authorizing Provider  folic acid (FOLVITE) 1 MG tablet Take 1 tablet (1 mg total) by mouth daily. 05/30/11 05/29/12 Yes Isidor Holts, MD  glipiZIDE (GLUCOTROL) 10 MG tablet Take 20 mg by mouth daily.     Yes Historical Provider, MD  insulin glargine (LANTUS) 100 UNIT/ML injection Inject 28 Units into the skin at bedtime.     Yes Historical Provider, MD  insulin glulisine (APIDRA) 100 UNIT/ML injection Inject 6 Units into the skin daily after breakfast.     Yes Historical Provider, MD  metFORMIN (GLUCOPHAGE) 1000 MG tablet Take 1,000 mg by mouth 2 (two) times daily with a meal.     Yes Historical Provider, MD  metoCLOPramide (REGLAN) 5 MG tablet Take 5 mg by mouth 4 (four) times daily -  before meals and at bedtime.   Yes Historical Provider, MD  Multiple Vitamin (MULITIVITAMIN WITH  MINERALS) TABS Take 1 tablet by mouth daily. 05/30/11  Yes Isidor Holts, MD  pantoprazole (PROTONIX) 40 MG tablet Take 1 tablet (40 mg total) by mouth daily. 05/30/11 05/29/12 Yes Isidor Holts, MD  thiamine 100 MG tablet Take 1 tablet (100 mg total) by mouth daily. 05/30/11 05/29/12 Yes Isidor Holts, MD    Social History:  reports that he has never smoked. He has never used smokeless tobacco. He reports that he drinks alcohol. He reports that he does not use illicit drugs.  Family History  Problem Relation Age of Onset  . Heart failure Mother     Review of Systems:  Negative except as mentioned in HPI.  Physical Exam: Blood pressure 159/105, pulse 105, temperature 98.2 F (36.8 C), temperature source Oral, resp. rate 18, SpO2 99.00%. General: alert, awake, oriented x 3 HEENT: normocephalic, atraumatic, PERRL, EOMI, dry mucous membranes. Neck: supple, no JVD, no lymphadenopathy, no bruits, no goiter. CV: regular rate and rhythm, no murmurs, rubs or gallops. Lungs: clear to auscultation bilaterally. Abdomen: TTP epigastric area, diminished BS, positive fluid wave. Ext: 1+ edema bilaterally, positive pedal pulses. Neuro: grossly intact and nonfocal.   Labs on Admission:  Results for orders placed during the hospital encounter of 07/28/11 (from the past 48 hour(s))  CBC     Status: Abnormal   Collection  Time   07/28/11  8:23 AM      Component Value Range Comment   WBC 4.2  4.0 - 10.5 (K/uL)    RBC 5.06  4.22 - 5.81 (MIL/uL)    Hemoglobin 16.5  13.0 - 17.0 (g/dL)    HCT 16.1  09.6 - 04.5 (%)    MCV 89.9  78.0 - 100.0 (fL)    MCH 32.6  26.0 - 34.0 (pg)    MCHC 36.3 (*) 30.0 - 36.0 (g/dL)    RDW 40.9  81.1 - 91.4 (%)    Platelets 324  150 - 400 (K/uL)   DIFFERENTIAL     Status: Abnormal   Collection Time   07/28/11  8:23 AM      Component Value Range Comment   Neutrophils Relative 57  43 - 77 (%)    Neutro Abs 2.4  1.7 - 7.7 (K/uL)    Lymphocytes Relative 19  12 - 46  (%)    Lymphs Abs 0.8  0.7 - 4.0 (K/uL)    Monocytes Relative 21 (*) 3 - 12 (%)    Monocytes Absolute 0.9  0.1 - 1.0 (K/uL)    Eosinophils Relative 2  0 - 5 (%)    Eosinophils Absolute 0.1  0.0 - 0.7 (K/uL)    Basophils Relative 1  0 - 1 (%)    Basophils Absolute 0.0  0.0 - 0.1 (K/uL)   COMPREHENSIVE METABOLIC PANEL     Status: Abnormal   Collection Time   07/28/11  8:23 AM      Component Value Range Comment   Sodium 137  135 - 145 (mEq/L)    Potassium 3.1 (*) 3.5 - 5.1 (mEq/L)    Chloride 100  96 - 112 (mEq/L)    CO2 28  19 - 32 (mEq/L)    Glucose, Bld 302 (*) 70 - 99 (mg/dL)    BUN 3 (*) 6 - 23 (mg/dL)    Creatinine, Ser 7.82  0.50 - 1.35 (mg/dL)    Calcium 8.8  8.4 - 10.5 (mg/dL)    Total Protein 6.6  6.0 - 8.3 (g/dL)    Albumin 2.0 (*) 3.5 - 5.2 (g/dL)    AST 15  0 - 37 (U/L)    ALT 6  0 - 53 (U/L)    Alkaline Phosphatase 128 (*) 39 - 117 (U/L)    Total Bilirubin 0.4  0.3 - 1.2 (mg/dL)    GFR calc non Af Amer >90  >90 (mL/min)    GFR calc Af Amer >90  >90 (mL/min)   LIPASE, BLOOD     Status: Abnormal   Collection Time   07/28/11  8:23 AM      Component Value Range Comment   Lipase 851 (*) 11 - 59 (U/L)   URINALYSIS, ROUTINE W REFLEX MICROSCOPIC     Status: Abnormal   Collection Time   07/28/11  8:56 AM      Component Value Range Comment   Color, Urine AMBER (*) YELLOW  BIOCHEMICALS MAY BE AFFECTED BY COLOR   APPearance CLOUDY (*) CLEAR     Specific Gravity, Urine 1.020  1.005 - 1.030     pH 6.5  5.0 - 8.0     Glucose, UA >1000 (*) NEGATIVE (mg/dL)    Hgb urine dipstick NEGATIVE  NEGATIVE     Bilirubin Urine NEGATIVE  NEGATIVE     Ketones, ur 15 (*) NEGATIVE (mg/dL)    Protein, ur 30 (*) NEGATIVE (mg/dL)  Urobilinogen, UA 1.0  0.0 - 1.0 (mg/dL)    Nitrite NEGATIVE  NEGATIVE     Leukocytes, UA NEGATIVE  NEGATIVE    URINE MICROSCOPIC-ADD ON     Status: Abnormal   Collection Time   07/28/11  8:56 AM      Component Value Range Comment   Squamous Epithelial / LPF RARE   RARE     WBC, UA 0-2  <3 (WBC/hpf)    RBC / HPF 0-2  <3 (RBC/hpf)    Bacteria, UA RARE  RARE     Casts HYALINE CASTS (*) NEGATIVE  GRANULAR CAST   Urine-Other MUCOUS PRESENT       Radiological Exams on Admission: Dg Abd 2 Views  07/28/2011  *RADIOLOGY REPORT*  Clinical Data: Abdominal distention.  Constipation.  Diabetes.  ABDOMEN - 2 VIEW  Comparison: 07/25/2011  Findings: No evidence of dilated bowel loops.  No evidence of free air.  Question left colonic wall thickening in the left abdomen, which may be seen with colitis, versus incomplete distention by bowel gas.  No evidence of radiopaque calculi.  IMPRESSION: Nonobstructive bowel gas pattern.  Question left colonic wall thickening versus incomplete distention.  Original Report Authenticated By: Danae Orleans, M.D.    Assessment/Plan Principal Problem:  *Pancreatitis Active Problems:  Diabetes mellitus  ETOH abuse   #1 Acute on Chronic Pancreatitis: Supportive care: bowel rest, antiemetics, pain meds as needed, IVF. Will order MRI abdomen to followup on CT scan from 12/12 with possible pancreatic mass.  #2 DM: since NPO, will hold lantus and place on SSI only.  #3 ETOH abuse: thiamine, folate, CIWA protocol.  #4 DVT prophylaxis: Lovenox.  Time Spent on Admission: 65 minutes.  Chaya Jan [facility] Pager: [phone] 07/28/2011, 5:53 PM

## 2011-07-28 NOTE — ED Notes (Signed)
Consent signed to retreive records from dr bland from fridays visit

## 2011-07-28 NOTE — ED Notes (Signed)
Pt began to complains of chest pain, md informed of same ekg done

## 2011-07-28 NOTE — ED Notes (Signed)
Discussed pt c/o nausea/vomiting with PA.

## 2011-07-28 NOTE — ED Notes (Signed)
[id number] Ready 

## 2011-07-28 NOTE — ED Notes (Signed)
Pt c/o nausea and 1 episode of vomiting after PO intake.

## 2011-07-28 NOTE — ED Notes (Signed)
Patient transported to X-ray 

## 2011-07-29 ENCOUNTER — Inpatient Hospital Stay (HOSPITAL_COMMUNITY): Payer: BC Managed Care – PPO

## 2011-07-29 LAB — COMPREHENSIVE METABOLIC PANEL
ALT: <5 U/L (ref 0–53)
AST: 11 U/L (ref 0–37)
Calcium: 8.2 mg/dL — ABNORMAL LOW (ref 8.4–10.5)
GFR calc Af Amer: >90 mL/min (ref >90)
Glucose, Bld: 163 mg/dL — ABNORMAL HIGH (ref 70–99)
Sodium: 138 mEq/L (ref 135–145)
Total Protein: 5.6 g/dL — ABNORMAL LOW (ref 6.0–8.3)

## 2011-07-29 LAB — GLUCOSE, CAPILLARY
Glucose-Capillary: 118 mg/dL — ABNORMAL HIGH (ref 70–99)
Glucose-Capillary: 321 mg/dL — ABNORMAL HIGH (ref 70–99)
Glucose-Capillary: 63 mg/dL — ABNORMAL LOW (ref 70–99)

## 2011-07-29 LAB — CBC
MCH: 31.6 pg (ref 26.0–34.0)
MCHC: 35 g/dL (ref 30.0–36.0)
Platelets: 284 10*3/uL (ref 150–400)
RDW: 12.5 % (ref 11.5–15.5)

## 2011-07-29 MED ORDER — CHLORHEXIDINE GLUCONATE CLOTH 2 % EX PADS
6.0000 | MEDICATED_PAD | Freq: Every day | CUTANEOUS | Status: AC
  Administered 2011-07-29 – 2011-08-02 (×5): 6 via TOPICAL

## 2011-07-29 MED ORDER — INSULIN ASPART 100 UNIT/ML ~~LOC~~ SOLN
0.0000 [IU] | Freq: Four times a day (QID) | SUBCUTANEOUS | Status: DC
  Administered 2011-07-29: 7 [IU] via SUBCUTANEOUS
  Administered 2011-07-30: 1 [IU] via SUBCUTANEOUS
  Administered 2011-07-30 (×2): 3 [IU] via SUBCUTANEOUS
  Administered 2011-07-31: 1 [IU] via SUBCUTANEOUS
  Administered 2011-07-31: 7 [IU] via SUBCUTANEOUS
  Administered 2011-07-31: 2 [IU] via SUBCUTANEOUS

## 2011-07-29 MED ORDER — POTASSIUM CHLORIDE CRYS ER 20 MEQ PO TBCR
40.0000 meq | EXTENDED_RELEASE_TABLET | Freq: Once | ORAL | Status: AC
  Administered 2011-07-29: 40 meq via ORAL
  Filled 2011-07-29 (×2): qty 2

## 2011-07-29 MED ORDER — GADOBENATE DIMEGLUMINE 529 MG/ML IV SOLN
15.0000 mL | Freq: Once | INTRAVENOUS | Status: AC
  Administered 2011-07-29: 15 mL via INTRAVENOUS

## 2011-07-29 MED ORDER — MUPIROCIN 2 % EX OINT
1.0000 "application " | TOPICAL_OINTMENT | Freq: Two times a day (BID) | CUTANEOUS | Status: AC
  Administered 2011-07-29 – 2011-08-02 (×9): 1 via NASAL
  Filled 2011-07-29: qty 22

## 2011-07-29 NOTE — Progress Notes (Signed)
Notified on call MD via text page  About patient's bp 170/112 and hr 108 on admission,no order received.

## 2011-07-29 NOTE — Progress Notes (Signed)
Subjective: Is hungry. Says abdominal pain has resolved.  Objective: Vital signs in last 24 hours: Temp:  [98 F (36.7 C)-98.7 F (37.1 C)] 98.1 F (36.7 C) (02/19 1420) Pulse Rate:  [94-110] 94  (02/19 1420) Resp:  [16-18] 16  (02/19 1420) BP: (134-172)/(94-112) 154/105 mmHg (02/19 1420) SpO2:  [98 %-99 %] 99 % (02/19 1420) Weight:  [65.772 kg (145 lb)-73.4 kg (161 lb 13.1 oz)] 73.4 kg (161 lb 13.1 oz) (02/19 0555) Weight change:  Last BM Date: 07/28/11  Intake/Output from previous day: 02/18 0701 - 02/19 0700 In: 2378 [I.V.:2378] Out: 350 [Urine:350]     Physical Exam: General: Alert, awake, oriented x3, in no acute distress. HEENT: No bruits, no goiter. Heart: Regular rate and rhythm, without murmurs, rubs, gallops. Lungs: Clear to auscultation bilaterally. Abdomen: Distended, positive bowel sounds. Extremities: No clubbing cyanosis or edema with positive pedal pulses. Neuro: Grossly intact, nonfocal.    Lab Results: Basic Metabolic Panel:  Basename 07/29/11 0530 07/28/11 1911 07/28/11 1817 07/28/11 0823  NA 138 -- -- 137  K 3.1* -- -- 3.1*  CL 105 -- -- 100  CO2 26 -- -- 28  GLUCOSE 163* -- -- 302*  BUN 5* -- -- 3*  CREATININE 0.59 0.61 -- --  CALCIUM 8.2* -- -- 8.8  MG -- -- 1.5 --  PHOS -- -- -- --   Liver Function Tests:  Basename 07/29/11 0530 07/28/11 0823  AST 11 15  ALT <5 6  ALKPHOS 117 128*  BILITOT 0.6 0.4  PROT 5.6* 6.6  ALBUMIN 1.7* 2.0*    Basename 07/28/11 0823  LIPASE 851*  AMYLASE --   CBC:  Basename 07/29/11 0530 07/28/11 1911 07/28/11 0823  WBC 5.8 5.6 --  NEUTROABS -- -- 2.4  HGB 15.1 16.6 --  HCT 43.1 48.0 --  MCV 90.2 90.7 --  PLT 284 309 --   CBG:  Basename 07/29/11 1615 07/29/11 1214 07/29/11 1110 07/29/11 0414 07/28/11 2359 07/28/11 2000  GLUCAP 118* 145* 141* 195* 206* 203*   Hemoglobin A1C:  Basename 07/28/11 1911  HGBA1C 8.4*   Urinalysis:  Basename 07/28/11 0856  COLORURINE AMBER*  LABSPEC 1.020    PHURINE 6.5  GLUCOSEU >1000*  HGBUR NEGATIVE  BILIRUBINUR NEGATIVE  KETONESUR 15*  PROTEINUR 30*  UROBILINOGEN 1.0  NITRITE NEGATIVE  LEUKOCYTESUR NEGATIVE    Recent Results (from the past 240 hour(s))  MRSA PCR SCREENING     Status: Abnormal   Collection Time   07/28/11  9:17 PM      Component Value Range Status Comment   MRSA by PCR POSITIVE (*) NEGATIVE  Final     Studies/Results: [name]  07/29/2011  *RADIOLOGY REPORT*  Clinical Data: Abdominal pain.  Elevated lipase.  History pancreatitis.  MRI ABDOMEN WITH AND WITHOUT CONTRAST  Technique:  Multiplanar multisequence MR imaging of the abdomen was performed both before and after administration of intravenous contrast.  Contrast: 15mL MULTIHANCE GADOBENATE DIMEGLUMINE 529 MG/ML IV SOLN  Comparison: 05/28/2011  Findings: Marked ascites is noted, significantly worsened compared the prior exam.  Diffuse mesenteric edema noted, with the extensive portasystemic collateralization and surrounding fluid infiltration causing an unusual layered vermiform appearance along the mesenteric margins.  The presence of ascites allows for a greater than normal amount of motion artifact involving the bowel and abdominal structures.  Minimally prominent pancreatic duct noted with irregularity especially in the pancreatic tail.  There is heterogeneous enhancement in the pancreas, likely a manifestation of the patient's  pancreatitis.  No focal mass is observed although given the heterogeneity of the pancreas, and infiltrative process is difficult to completely exclude.  Reduced signal intensity in the head of the pancreas is observed on pre and postcontrast images, with ill definition of pancreatic margins.  No discrete renal abnormality is observed.  Splenic enhancement appears normal.  Mild contour flattening of the liver is probably from tense ascites.  A 7 mm right hepatic lobe cyst as shown on image 34 of series 16109.  Subcutaneous edema is  present diffusely.  IMPRESSION:  1.  Prominent increase in abdominal ascites which currently appears tense and extensive.  There is accompanying diffuse mesenteric edema, with portasystemic collaterals causing a layered vermiform appearance within and along the mesentery and omentum. 2.  Heterogeneous signal intensity and enhancement of the pancreas, with reduced signal intensity in the pancreatic head and adjacent body compared to the pancreatic tail.  Although this could be from an infiltrative process, the lack of a visualized mass lesion raises my suspicion that this is probably a manifestation of the patient's acute on chronic pancreatitis.  I doubt that this appearance is due to pancreatic necrosis, but if the patient is fulminantly ill, then urgent surgical assessment may be warranted.  3.  Subcutaneous edema.  Original Report Authenticated By: Dellia Cloud, M.D.   Dg Abd 2 Views  07/28/2011  *RADIOLOGY REPORT*  Clinical Data: Abdominal distention.  Constipation.  Diabetes.  ABDOMEN - 2 VIEW  Comparison: 07/25/2011  Findings: No evidence of dilated bowel loops.  No evidence of free air.  Question left colonic wall thickening in the left abdomen, which may be seen with colitis, versus incomplete distention by bowel gas.  No evidence of radiopaque calculi.  IMPRESSION: Nonobstructive bowel gas pattern.  Question left colonic wall thickening versus incomplete distention.  Original Report Authenticated By: Danae Orleans, M.D.    Medications: Scheduled Meds:   . antiseptic oral rinse  15 mL Mouth Rinse q12n4p  . chlorhexidine  15 mL Mouth Rinse BID  . Chlorhexidine Gluconate Cloth  6 each Topical Q0600  . enoxaparin  40 mg Subcutaneous Q24H  . folic acid  1 mg Intravenous Once  . gadobenate dimeglumine  15 mL Intravenous Once  . insulin aspart  0-9 Units Subcutaneous QID  . mupirocin ointment  1 application Nasal BID  . potassium chloride  10 mEq Intravenous Q1 Hr x 3  . potassium  chloride  40 mEq Oral Once  . thiamine  100 mg Intravenous Daily  . DISCONTD: sodium chloride   Intravenous STAT  . DISCONTD: folic acid (FOLVITE) IVPB  1 mg Intravenous Once  . DISCONTD: insulin aspart  0-9 Units Subcutaneous TID WC  . DISCONTD: insulin aspart  0-9 Units Subcutaneous Q4H   Continuous Infusions:   . sodium chloride 125 mL/hr at 07/29/11 0127   PRN Meds:.morphine, ondansetron (ZOFRAN) IV, ondansetron  Assessment/Plan:  Principal Problem:  *Pancreatitis Active Problems:  Diabetes mellitus  ETOH abuse   #1 Acute on Chronic Pancreatitis: Clinically improved. Advance diet. Hope for DC home in 1-2 days. MRI findings not concerning for pancreatic mass, but rather look like severe inflammation from his pancreatitis. Patient counseled on importance of cessation of ETOH.  #2 DM: fair control.  #3 ETOH abuse: thiamine/folate.   LOS: 1 day   [facility] Pager: [phone] 07/29/2011, 4:40 PM

## 2011-07-29 NOTE — ED Provider Notes (Signed)
Medical screening examination/treatment/procedure(s) were conducted as a shared visit with non-physician practitioner(s) and myself.  I personally evaluated the patient during the encounter. Acute on chronic pancreatitis. Unable to tolerate orals. Will admit   Juliet Rude. Rubin Payor, MD 07/29/11 [phone]

## 2011-07-29 NOTE — Progress Notes (Signed)
INITIAL ADULT NUTRITION ASSESSMENT Date: 07/29/2011   Time: 9:49 AM  Reason for Assessment: Consult--unintentional weight loss  ASSESSMENT: Male 51 y.o.  Dx: Pancreatitis  Hx:  Past Medical History  Diagnosis Date  . Diabetes mellitus   . Meningitis   . Pneumonia   . GERD (gastroesophageal reflux disease)   . Pancreatitis   . Angina     Related Meds:  Scheduled Meds:   . antiseptic oral rinse  15 mL Mouth Rinse q12n4p  . chlorhexidine  15 mL Mouth Rinse BID  . Chlorhexidine Gluconate Cloth  6 each Topical Q0600  . enoxaparin  40 mg Subcutaneous Q24H  . folic acid  1 mg Intravenous Once  . gadobenate dimeglumine  15 mL Intravenous Once  . insulin aspart  0-9 Units Subcutaneous Q4H  . mupirocin ointment  1 application Nasal BID  . potassium chloride  10 mEq Intravenous Q1 Hr x 3  . thiamine  100 mg Intravenous Daily  . DISCONTD: sodium chloride   Intravenous STAT  . DISCONTD: folic acid (FOLVITE) IVPB  1 mg Intravenous Once  . DISCONTD: insulin aspart  0-9 Units Subcutaneous TID WC   Continuous Infusions:   . sodium chloride 125 mL/hr at 07/29/11 0127   PRN Meds:.morphine, ondansetron (ZOFRAN) IV, ondansetron   Ht: 5\' 8"  (172.7 cm)  Wt: 161 lb 13.1 oz (73.4 kg) (rn notified)  Patient reports that he has gained weight since December; up to around 156 lb; current weight is elevated due to fluid accumulation in his belly.  Ideal Wt: 70 kg % Ideal Wt: 105% (up with fluid retention)  Wt Readings from Last 3 Encounters:  07/29/11 161 lb 13.1 oz (73.4 kg)  05/30/11 145 lb 4.5 oz (65.9 kg)   Usual Wt: 145 lb (December 2012); 176 lb (October 2012 per patient) % Usual Wt: 111%  Body mass index is 24.60 kg/(m^2).  Food/Nutrition Related Hx: Patient reports eating "whatever I want" at home; only had 1 episode of vomiting in the ED; no vomiting or abdominal pain at home or since admission.  Current weight (161 lb) is 9% below usual weight from 4 months ago.    Labs:    CMP     Component Value Date/Time   NA 138 07/29/2011 0530   K 3.1* 07/29/2011 0530   CL 105 07/29/2011 0530   CO2 26 07/29/2011 0530   GLUCOSE 163* 07/29/2011 0530   BUN 5* 07/29/2011 0530   CREATININE 0.59 07/29/2011 0530   CALCIUM 8.2* 07/29/2011 0530   PROT 5.6* 07/29/2011 0530   ALBUMIN 1.7* 07/29/2011 0530   AST 11 07/29/2011 0530   ALT <5 07/29/2011 0530   ALKPHOS 117 07/29/2011 0530   BILITOT 0.6 07/29/2011 0530   GFRNONAA >90 07/29/2011 0530   GFRAA >90 07/29/2011 0530    CBG (last 3)   Basename 07/29/11 0414 07/28/11 2359 07/28/11 2000  GLUCAP 195* 206* 203*    Intake/Output Summary (Last 24 hours) at 07/29/11 0952 Last data filed at 07/29/11 0538  Gross per 24 hour  Intake   2378 ml  Output    350 ml  Net   2028 ml    Diet Order: NPO  IVF:    sodium chloride Last Rate: 125 mL/hr at 07/29/11 0127    Estimated Nutritional Needs:   Kcal: 2050-2350 Protein: 105-120 grams Fluid: 2.1-2.4 liters  Patient requesting at least some liquids to eat/drink; is hungry.  Patient suspects that current weight is elevated due to fluid accumulation  in his abdomen.  Patient with moderate malnutrition in the context of chronic illness, given >/= 9% weight loss, fluid accumulation in the belly, with visible loss of subcutaneous fat.  NUTRITION DIAGNOSIS: -Inadequate oral intake (NI-2.1).  Status: Ongoing  RELATED TO: altered GI function   AS EVIDENCED BY: NPO status  MONITORING/EVALUATION(Goals): Goal:  Diet advancement with adequate oral intake (>75% of meals).  Monitor:  Diet advancement, PO intake, weight trend, labs.  EDUCATION NEEDS: -Education needs addressed; discussed low fat diet recommendations with patient for pancreatitis.  INTERVENTION: Recommend advance diet as tolerated to Fat Modified diet.  Dietitian #:  [phone]  DOCUMENTATION CODES Per approved criteria  -Non-severe (moderate) malnutrition in the context of chronic illness    Hettie Holstein 07/29/2011, 9:49 AM

## 2011-07-29 NOTE — Progress Notes (Signed)
RN received call from MRI. Results were faxed to RN. Rn called results to Dr. Ardyth Harps

## 2011-07-29 NOTE — Progress Notes (Signed)
Utilization Review Completed.Melvin [name] T2/19/2013   

## 2011-07-30 LAB — BASIC METABOLIC PANEL
GFR calc non Af Amer: >90 mL/min (ref >90)
Glucose, Bld: 115 mg/dL — ABNORMAL HIGH (ref 70–99)
Potassium: 3.7 mEq/L (ref 3.5–5.1)
Sodium: 136 mEq/L (ref 135–145)

## 2011-07-30 LAB — CBC
HCT: 41.3 % (ref 39.0–52.0)
MCHC: 35.1 g/dL (ref 30.0–36.0)
Platelets: 279 10*3/uL (ref 150–400)
RDW: 12.7 % (ref 11.5–15.5)

## 2011-07-30 LAB — GLUCOSE, CAPILLARY: Glucose-Capillary: 226 mg/dL — ABNORMAL HIGH (ref 70–99)

## 2011-07-30 LAB — LIPASE, BLOOD: Lipase: 515 U/L — ABNORMAL HIGH (ref 11–59)

## 2011-07-30 MED ORDER — BISACODYL 5 MG PO TBEC
10.0000 mg | DELAYED_RELEASE_TABLET | Freq: Every day | ORAL | Status: DC | PRN
  Administered 2011-07-30: 10 mg via ORAL
  Filled 2011-07-30: qty 2

## 2011-07-30 MED ORDER — VITAMIN B-1 100 MG PO TABS
100.0000 mg | ORAL_TABLET | Freq: Every day | ORAL | Status: DC
  Administered 2011-07-30 – 2011-08-08 (×10): 100 mg via ORAL
  Filled 2011-07-30 (×11): qty 1

## 2011-07-30 MED ORDER — FLEET ENEMA 7-19 GM/118ML RE ENEM
1.0000 | ENEMA | Freq: Every day | RECTAL | Status: AC | PRN
  Filled 2011-07-30: qty 1

## 2011-07-30 MED ORDER — MAGNESIUM HYDROXIDE 400 MG/5ML PO SUSP
30.0000 mL | Freq: Once | ORAL | Status: AC
  Administered 2011-07-30: 30 mL via ORAL
  Filled 2011-07-30: qty 30

## 2011-07-30 NOTE — Progress Notes (Signed)
Subjective: C/o feeling bloated with increased flatus, and constipation. denies abd pain, no n/v.  Objective: Vital signs in last 24 hours: Temp:  [97.3 F (36.3 C)-98.4 F (36.9 C)] 97.3 F (36.3 C) (02/20 1441) Pulse Rate:  [91-94] 91  (02/20 1441) Resp:  [18-19] 19  (02/20 1441) BP: (130-172)/(88-116) 172/116 mmHg (02/20 1441) SpO2:  [95 %-100 %] 100 % (02/20 1441) Weight change:  Last BM Date: 07/29/11  Intake/Output from previous day: 02/19 0701 - 02/20 0700 In: 809 [I.V.:809] Out: -      Physical Exam: General: Alert, awake, oriented x3, in no acute distress. HEENT: No bruits, no goiter. Heart: Regular rate and rhythm, without murmurs, rubs, gallops. Lungs: Clear to auscultation bilaterally. Abdomen: Distended, nontender, positive bowel sounds. Extremities: No clubbing cyanosis or edema with positive pedal pulses. Neuro: Grossly intact, nonfocal.    Lab Results: Basic Metabolic Panel:  Basename 07/30/11 0610 07/29/11 1737 07/29/11 0530 07/28/11 1817  NA 136 -- 138 --  K 3.7 -- 3.1* --  CL 106 -- 105 --  CO2 25 -- 26 --  GLUCOSE 115* -- 163* --  BUN 6 -- 5* --  CREATININE 0.61 -- 0.59 --  CALCIUM 8.2* -- 8.2* --  MG -- 1.6 -- 1.5  PHOS -- -- -- --   Liver Function Tests:  Basename 07/29/11 0530 07/28/11 0823  AST 11 15  ALT <5 6  ALKPHOS 117 128*  BILITOT 0.6 0.4  PROT 5.6* 6.6  ALBUMIN 1.7* 2.0*    Basename 07/30/11 1201 07/28/11 0823  LIPASE 515* 851*  AMYLASE -- --   CBC:  Basename 07/30/11 0610 07/29/11 0530 07/28/11 0823  WBC 5.5 5.8 --  NEUTROABS -- -- 2.4  HGB 14.5 15.1 --  HCT 41.3 43.1 --  MCV 90.6 90.2 --  PLT 279 284 --   CBG:  Basename 07/30/11 1207 07/30/11 0821 07/30/11 0021 07/29/11 2345 07/29/11 1931 07/29/11 1615  GLUCAP 149* 104* 125* 63* 321* 118*   Hemoglobin A1C:  Basename 07/28/11 1911  HGBA1C 8.4*   Urinalysis:  Basename 07/28/11 0856  COLORURINE AMBER*  LABSPEC 1.020  PHURINE 6.5  GLUCOSEU >1000*    HGBUR NEGATIVE  BILIRUBINUR NEGATIVE  KETONESUR 15*  PROTEINUR 30*  UROBILINOGEN 1.0  NITRITE NEGATIVE  LEUKOCYTESUR NEGATIVE    Recent Results (from the past 240 hour(s))  MRSA PCR SCREENING     Status: Abnormal   Collection Time   07/28/11  9:17 PM      Component Value Range Status Comment   MRSA by PCR POSITIVE (*) NEGATIVE  Final     Studies/Results: [name]  07/29/2011  *RADIOLOGY REPORT*  Clinical Data: Abdominal pain.  Elevated lipase.  History pancreatitis.  MRI ABDOMEN WITH AND WITHOUT CONTRAST  Technique:  Multiplanar multisequence MR imaging of the abdomen was performed both before and after administration of intravenous contrast.  Contrast: 15mL MULTIHANCE GADOBENATE DIMEGLUMINE 529 MG/ML IV SOLN  Comparison: 05/28/2011  Findings: Marked ascites is noted, significantly worsened compared the prior exam.  Diffuse mesenteric edema noted, with the extensive portasystemic collateralization and surrounding fluid infiltration causing an unusual layered vermiform appearance along the mesenteric margins.  The presence of ascites allows for a greater than normal amount of motion artifact involving the bowel and abdominal structures.  Minimally prominent pancreatic duct noted with irregularity especially in the pancreatic tail.  There is heterogeneous enhancement in the pancreas, likely a manifestation of the patient's pancreatitis.  No focal mass is observed although  given the heterogeneity of the pancreas, and infiltrative process is difficult to completely exclude.  Reduced signal intensity in the head of the pancreas is observed on pre and postcontrast images, with ill definition of pancreatic margins.  No discrete renal abnormality is observed.  Splenic enhancement appears normal.  Mild contour flattening of the liver is probably from tense ascites.  A 7 mm right hepatic lobe cyst as shown on image 34 of series [id number].  Subcutaneous edema is present diffusely.  IMPRESSION:   1.  Prominent increase in abdominal ascites which currently appears tense and extensive.  There is accompanying diffuse mesenteric edema, with portasystemic collaterals causing a layered vermiform appearance within and along the mesentery and omentum. 2.  Heterogeneous signal intensity and enhancement of the pancreas, with reduced signal intensity in the pancreatic head and adjacent body compared to the pancreatic tail.  Although this could be from an infiltrative process, the lack of a visualized mass lesion raises my suspicion that this is probably a manifestation of the patient's acute on chronic pancreatitis.  I doubt that this appearance is due to pancreatic necrosis, but if the patient is fulminantly ill, then urgent surgical assessment may be warranted.  3.  Subcutaneous edema.  Original Report Authenticated By: Dellia Cloud, M.D.    Medications: Scheduled Meds:    . antiseptic oral rinse  15 mL Mouth Rinse q12n4p  . chlorhexidine  15 mL Mouth Rinse BID  . Chlorhexidine Gluconate Cloth  6 each Topical Q0600  . enoxaparin  40 mg Subcutaneous Q24H  . insulin aspart  0-9 Units Subcutaneous QID  . magnesium hydroxide  30 mL Oral Once  . mupirocin ointment  1 application Nasal BID  . potassium chloride  40 mEq Oral Once  . thiamine  100 mg Oral Daily  . DISCONTD: thiamine  100 mg Intravenous Daily   Continuous Infusions:    . sodium chloride 125 mL/hr at 07/30/11 0606   PRN Meds:.morphine, ondansetron (ZOFRAN) IV, ondansetron  Assessment/Plan:  Principal Problem:  *Pancreatitis Active Problems:  Diabetes mellitus  ETOH abuse   #1 Acute on Chronic Pancreatitis: Clinically improving, lipase gradually decreasing . MRI findings not concerning for pancreatic mass, but rather look like severe inflammation from his pancreatitis. -follow for contibued improvement  -Patient counseled on importance of cessation of ETOH.  #2Constipation: 2/2 narcotics,  Laxatives, prn enema and  follow #3 DM: fair control.  #4 ETOH abuse: thiamine/folate.   LOS: 2 days   Kela Millin [facility] Pager: [phone] 07/30/2011, 3:47 PM

## 2011-07-31 LAB — BASIC METABOLIC PANEL
BUN: 5 mg/dL — ABNORMAL LOW (ref 6–23)
Calcium: 8.4 mg/dL (ref 8.4–10.5)
GFR calc non Af Amer: >90 mL/min (ref >90)
Glucose, Bld: 167 mg/dL — ABNORMAL HIGH (ref 70–99)
Sodium: 133 mEq/L — ABNORMAL LOW (ref 135–145)

## 2011-07-31 LAB — GLUCOSE, CAPILLARY
Glucose-Capillary: 147 mg/dL — ABNORMAL HIGH (ref 70–99)
Glucose-Capillary: 112 mg/dL — ABNORMAL HIGH (ref 70–99)

## 2011-07-31 LAB — CBC
MCH: 31.7 pg (ref 26.0–34.0)
MCHC: 35 g/dL (ref 30.0–36.0)
Platelets: 288 10*3/uL (ref 150–400)
RBC: 4.73 MIL/uL (ref 4.22–5.81)

## 2011-07-31 LAB — LIPASE, BLOOD: Lipase: 472 U/L — ABNORMAL HIGH (ref 11–59)

## 2011-07-31 MED ORDER — INSULIN ASPART 100 UNIT/ML ~~LOC~~ SOLN
0.0000 [IU] | Freq: Three times a day (TID) | SUBCUTANEOUS | Status: DC
  Administered 2011-08-01: 2 [IU] via SUBCUTANEOUS
  Administered 2011-08-01 (×2): 3 [IU] via SUBCUTANEOUS
  Administered 2011-08-02: 1 [IU] via SUBCUTANEOUS

## 2011-07-31 NOTE — Progress Notes (Signed)
Subjective:  +BM, denies andy abd. Pain,  No n/v.Requesting for diet to be advanced to solids  Objective: Vital signs in last 24 hours: Temp:  [97.7 F (36.5 C)-98.1 F (36.7 C)] 98.1 F (36.7 C) (02/21 1417) Pulse Rate:  [93-104] 104  (02/21 1417) Resp:  [16-18] 18  (02/21 1417) BP: (122-151)/(92-102) 122/92 mmHg (02/21 1417) SpO2:  [98 %-100 %] 100 % (02/21 1417) Weight change:  Last BM Date: 07/30/11  Intake/Output from previous day: 02/20 0701 - 02/21 0700 In: 2454.2 [P.O.:720; I.V.:1734.2] Out: 801 [Urine:800; Stool:1] Total I/O In: 600 [I.V.:600] Out: -    Physical Exam: General: Alert, awake, oriented x3, in no acute distress. HEENT: No bruits, no goiter. Heart: Regular rate and rhythm, without murmurs, rubs, gallops. Lungs: Clear to auscultation bilaterally. Abdomen: Distended, soft, nontender, positive bowel sounds. Extremities: No clubbing cyanosis or edema with positive pedal pulses. Neuro: Grossly intact, nonfocal.    Lab Results: Basic Metabolic Panel:  Basename 07/31/11 0530 07/30/11 0610 07/29/11 1737 07/28/11 1817  NA 133* 136 -- --  K 4.1 3.7 -- --  CL 102 106 -- --  CO2 25 25 -- --  GLUCOSE 167* 115* -- --  BUN 5* 6 -- --  CREATININE 0.58 0.61 -- --  CALCIUM 8.4 8.2* -- --  MG -- -- 1.6 1.5  PHOS -- -- -- --   Liver Function Tests:  Basename 07/29/11 0530  AST 11  ALT <5  ALKPHOS 117  BILITOT 0.6  PROT 5.6*  ALBUMIN 1.7*    Basename 07/31/11 0530 07/30/11 1201  LIPASE 426* 515*  AMYLASE -- --   CBC:  Basename 07/31/11 0530 07/30/11 0610  WBC 5.0 5.5  NEUTROABS -- --  HGB 15.0 14.5  HCT 42.9 41.3  MCV 90.7 90.6  PLT 288 279   CBG:  Basename 07/31/11 1203 07/31/11 0754 07/30/11 2147 07/30/11 1705 07/30/11 1207 07/30/11 0821  GLUCAP 199* 147* 201* 226* 149* 104*   Hemoglobin A1C:  Basename 07/28/11 1911  HGBA1C 8.4*   Urinalysis: No results found for this basename:  COLORURINE:2,APPERANCEUR:2,LABSPEC:2,PHURINE:2,GLUCOSEU:2,HGBUR:2,BILIRUBINUR:2,KETONESUR:2,PROTEINUR:2,UROBILINOGEN:2,NITRITE:2,LEUKOCYTESUR:2 in the last 72 hours  Recent Results (from the past 240 hour(s))  MRSA PCR SCREENING     Status: Abnormal   Collection Time   07/28/11  9:17 PM      Component Value Range Status Comment   MRSA by PCR POSITIVE (*) NEGATIVE  Final     Studies/Results: No results found.  Medications: Scheduled Meds:    . Chlorhexidine Gluconate Cloth  6 each Topical Q0600  . enoxaparin  40 mg Subcutaneous Q24H  . insulin aspart  0-9 Units Subcutaneous QID  . mupirocin ointment  1 application Nasal BID  . thiamine  100 mg Oral Daily  . DISCONTD: antiseptic oral rinse  15 mL Mouth Rinse q12n4p  . DISCONTD: chlorhexidine  15 mL Mouth Rinse BID   Continuous Infusions:    . sodium chloride 75 mL/hr at 07/31/11 0422   PRN Meds:.bisacodyl, morphine, ondansetron (ZOFRAN) IV, ondansetron, sodium phosphate  Assessment/Plan:  Principal Problem:  *Pancreatitis Active Problems:  Diabetes mellitus  ETOH abuse   #1 Acute on Chronic Pancreatitis with Ascites: Clinically improving, lipase gradually decreasing . MRI findings not concerning for pancreatic mass, but rather look like severe inflammation from his pancreatitis. -GI consulted, discussed with Dr Elnoria Howard - he rcommends diagnostic paracentesis; he will see pt  -patient counseled on importance of cessation of ETOH.  #2Constipation: 2/2 narcotics, resolved on current bowel regimen  #3 DM: fair control.  #  4 ETOH abuse: thiamine/folate.   LOS: 3 days   Kela Millin [facility] Pager: [phone] 07/31/2011, 3:21 PM

## 2011-07-31 NOTE — Consult Note (Signed)
Reason for Consult: Abnormal MRI Referring Physician: [facility]  [facility] HPI: This is 51 year old male with a history of chronic pancreatitis secondary to ETOH.  During his last admission to the hospital he underwent a CT scan and there was a suggestion of a mass in his pancreas.  During his admission, he was admitted for a flare of his pancreatitis, and a MRI was performed.  Previously it was recommended that he have an MRI scan, but he never follow up on this recommendation. Since the last evaluation he is noted to have a worsening of his ascites, but no pancreatic mass.  On the CT scan in December there was evidence of splenic insufficiency and collateralization of the gastroepiploic vessels.  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  Past Medical History  Diagnosis Date  . Diabetes mellitus   . Meningitis   . Pneumonia   . GERD (gastroesophageal reflux disease)   . Pancreatitis   . Angina     Past Surgical History  Procedure Date  . Tonsillectomy and adenoidectomy     "as a kid"    Family History  Problem Relation Age of Onset  . Heart failure Mother     Social History:  reports that he has never smoked. He has never used smokeless tobacco. He reports that he drinks about 2.4 ounces of alcohol per week. He reports that he uses illicit drugs ("Crack" cocaine).  Allergies: No Known Allergies  Medications:  Scheduled:   . Chlorhexidine Gluconate Cloth  6 each Topical Q0600  . enoxaparin  40 mg Subcutaneous Q24H  . insulin aspart  0-9 Units Subcutaneous QID  . mupirocin ointment  1 application Nasal BID  . thiamine  100 mg Oral Daily  . DISCONTD: antiseptic oral rinse  15 mL Mouth Rinse q12n4p  . DISCONTD: chlorhexidine  15 mL Mouth Rinse BID   Continuous:   . sodium chloride 75 mL/hr at 07/31/11 0422    Results for orders placed during the hospital encounter of 07/28/11 (from the past 24 hour(s))  GLUCOSE, CAPILLARY      Status: Abnormal   Collection Time   07/30/11  9:47 PM      Component Value Range   Glucose-Capillary 201 (*) 70 - 99 (mg/dL)  LIPASE, BLOOD     Status: Abnormal   Collection Time   07/31/11  5:30 AM      Component Value Range   Lipase 426 (*) 11 - 59 (U/L)  BASIC METABOLIC PANEL     Status: Abnormal   Collection Time   07/31/11  5:30 AM      Component Value Range   Sodium 133 (*) 135 - 145 (mEq/L)   Potassium 4.1  3.5 - 5.1 (mEq/L)   Chloride 102  96 - 112 (mEq/L)   CO2 25  19 - 32 (mEq/L)   Glucose, Bld 167 (*) 70 - 99 (mg/dL)   BUN 5 (*) 6 - 23 (mg/dL)   Creatinine, Ser 4.09  0.50 - 1.35 (mg/dL)   Calcium 8.4  8.4 - 81.1 (mg/dL)   GFR calc non Af Amer >90  >90 (mL/min)   GFR calc Af Amer >90  >90 (mL/min)  CBC     Status: Normal   Collection Time   07/31/11  5:30 AM      Component Value Range   WBC 5.0  4.0 - 10.5 (K/uL)   RBC 4.73  4.22 - 5.81 (MIL/uL)   Hemoglobin  15.0  13.0 - 17.0 (g/dL)   HCT 57.8  46.9 - 62.9 (%)   MCV 90.7  78.0 - 100.0 (fL)   MCH 31.7  26.0 - 34.0 (pg)   MCHC 35.0  30.0 - 36.0 (g/dL)   RDW 52.8  41.3 - 24.4 (%)   Platelets 288  150 - 400 (K/uL)  GLUCOSE, CAPILLARY     Status: Abnormal   Collection Time   07/31/11  7:54 AM      Component Value Range   Glucose-Capillary 147 (*) 70 - 99 (mg/dL)   Comment 1 Documented in Chart     Comment 2 Notify RN    LACTIC ACID, PLASMA     Status: Normal   Collection Time   07/31/11  9:06 AM      Component Value Range   Lactic Acid, Venous 1.3  0.5 - 2.2 (mmol/L)  GLUCOSE, CAPILLARY     Status: Abnormal   Collection Time   07/31/11 12:03 PM      Component Value Range   Glucose-Capillary 199 (*) 70 - 99 (mg/dL)   Comment 1 Documented in Chart     Comment 2 Notify RN       No results found.  ROS:  As stated above in the HPI otherwise negative.  Blood pressure 136/108, pulse 108, temperature 98.2 F (36.8 C), temperature source Oral, resp. rate 18, height 5\' 8"  (1.727 m), weight 73.4 kg (161 lb 13.1 oz),  SpO2 97.00%.    PE: Gen: NAD, Alert and Oriented HEENT:  [place]/AT, EOMI Neck: Supple, no LAD Lungs: CTA Bilaterally CV: RRR without M/G/R ABM: Soft, NT, distended with ascites, +BS Ext: No C/C/E  Assessment/Plan: 1) Portal HTN - Likely cirrhosis 2) ETOH abuse.   From what I can discern on the MRI and CT scan is that he has a thrombosis of his portosplenic confluence.  Subsequently he has developed collateralization that is compounded by his cirrhosis.  There is no overt evidence of cirrhosis, but the caudate lobe is mildly prominent and this is suspicious for cirrhosis.  The patient has a long history of ETOH abuse.  It will be necessary to check his ascites and make sure that it is portal hypertensive with the SAAG.  A possibility of his current findings could be a pancreatic duct disruption resulting in a pancreatic ascites and a presinusoidal portal HTN.  Plan: 1) Diagnostic paracentesis. 2) Stop drinking ETOH.  Deb Loudin D 07/31/2011, 6:52 PM

## 2011-08-01 ENCOUNTER — Inpatient Hospital Stay (HOSPITAL_COMMUNITY): Payer: BC Managed Care – PPO

## 2011-08-01 LAB — ALBUMIN, FLUID (OTHER): Albumin, Fluid: 1.2 g/dL

## 2011-08-01 LAB — BASIC METABOLIC PANEL
BUN: 4 mg/dL — ABNORMAL LOW (ref 6–23)
Creatinine, Ser: 0.63 mg/dL (ref 0.50–1.35)
GFR calc Af Amer: >90 mL/min (ref >90)
GFR calc non Af Amer: >90 mL/min (ref >90)
Potassium: 3.8 mEq/L (ref 3.5–5.1)

## 2011-08-01 LAB — LACTATE DEHYDROGENASE, PLEURAL OR PERITONEAL FLUID: LD, Fluid: 248 U/L — ABNORMAL HIGH (ref 3–23)

## 2011-08-01 LAB — GLUCOSE, CAPILLARY
Glucose-Capillary: 105 mg/dL — ABNORMAL HIGH (ref 70–99)
Glucose-Capillary: 224 mg/dL — ABNORMAL HIGH (ref 70–99)
Glucose-Capillary: 231 mg/dL — ABNORMAL HIGH (ref 70–99)
Glucose-Capillary: 195 mg/dL — ABNORMAL HIGH (ref 70–99)

## 2011-08-01 LAB — AMYLASE, BODY FLUID

## 2011-08-01 LAB — BODY FLUID CELL COUNT WITH DIFFERENTIAL

## 2011-08-01 LAB — LIPASE, FLUID: Lipase-Fluid: >2000 U/L

## 2011-08-01 LAB — LACTATE DEHYDROGENASE: LDH: 284 U/L — ABNORMAL HIGH (ref 94–250)

## 2011-08-01 NOTE — Progress Notes (Signed)
Patient ID: Melvin [name], male   DOB: 10/19/1960, 50 y.o.   MRN: [id number] Subjective: No acute events.  Objective: Vital signs in last 24 hours: Temp:  [97.7 F (36.5 C)-99 F (37.2 C)] 98.6 F (37 C) (02/22 1345) Pulse Rate:  [89-113] 102  (02/22 1345) Resp:  [18] 18  (02/22 1345) BP: (117-154)/(78-110) 136/96 mmHg (02/22 1400) SpO2:  [97 %-98 %] 98 % (02/22 1345) Last BM Date: 07/30/11  Intake/Output from previous day: 02/21 0701 - 02/22 0700 In: 1164 [I.V.:1164] Out: 900 [Urine:900] Intake/Output this shift:    General appearance: alert and no distress GI: soft, distended with ascites  Lab Results:  Basename 07/31/11 0530 07/30/11 0610  WBC 5.0 5.5  HGB 15.0 14.5  HCT 42.9 41.3  PLT 288 279   BMET  Basename 08/01/11 0540 07/31/11 0530 07/30/11 0610  NA 136 133* 136  K 3.8 4.1 3.7  CL 105 102 106  CO2 23 25 25  GLUCOSE 104* 167* 115*  BUN 4* 5* 6  CREATININE 0.63 0.58 0.61  CALCIUM 8.5 8.4 8.2*   LFT  Basename 08/01/11 0945  PROT 5.9*  ALBUMIN 1.8*  AST --  ALT --  ALKPHOS --  BILITOT --  BILIDIR --  IBILI --   PT/INR No results found for this basename: LABPROT:2,INR:2 in the last 72 hours Hepatitis Panel No results found for this basename: HEPBSAG,HCVAB,HEPAIGM,HEPBIGM in the last 72 hours C-Diff No results found for this basename: CDIFFTOX:3 in the last 72 hours Fecal Lactopherrin No results found for this basename: FECLLACTOFRN in the last 72 hours  Studies/Results: Us Paracentesis  08/01/2011  *RADIOLOGY REPORT*  Clinical Data: Abdominal ascites  ULTRASOUND GUIDED PARACENTESIS  Comparison:  None  An ultrasound guided paracentesis was thoroughly discussed with the patient and questions answered.  The benefits, risks, alternatives and complications were also discussed.  The patient understands and wishes to proceed with the procedure.  Written consent was obtained.  Ultrasound was performed to localize and mark an adequate pocket of fluid  in the right lower quadrant of the abdomen.  The area was then prepped and draped in the normal sterile fashion.  1% Lidocaine was used for local anesthesia.  Under ultrasound guidance a 19 gauge Yueh catheter was introduced.  Paracentesis was performed.  The catheter was removed and a dressing applied.  Complications:  None  Findings:  A total of approximately 6.2 liters of blood-tinged fluid was removed.  A fluid sample was sent for laboratory analysis.  IMPRESSION: Successful ultrasound guided paracentesis yielding 6.2 liters of ascites.  Read by: Pamela  Turpin, P.A.-C  Original Report Authenticated By: JOHN A. WATTS V, M.D.    Medications:  Scheduled:   . Chlorhexidine Gluconate Cloth  6 each Topical Q0600  . enoxaparin  40 mg Subcutaneous Q24H  . insulin aspart  0-9 Units Subcutaneous TID AC & HS  . mupirocin ointment  1 application Nasal BID  . thiamine  100 mg Oral Daily  . DISCONTD: insulin aspart  0-9 Units Subcutaneous QID   Continuous:   . sodium chloride 75 mL/hr at 08/01/11 0433    Assessment/Plan: 1) Pancreatic ascites   At this time it appears that he has a pancreatic ascites with presinusoidal portal HTN.  His total protein is elevated as well as his amylase.  Also his SAAG is less than 1.2.   Treatment is two fold at this time before pursuing surgery - Stenting via ERCP and TPN.  Diuretics can help.    Plan: 1) ERCP tomorrow with pancreatic stent placement. 2) NPO with TPN after the ERCP.  LOS: 4 days   Eustolia Drennen D 08/01/2011, 4:17 PM    

## 2011-08-01 NOTE — Progress Notes (Signed)
Subjective:  Status post paracentesis, he states his abdomen feels much better.  Objective: Vital signs in last 24 hours: Temp:  [97.7 F (36.5 [name])-99 F (37.2 [name])] 99 F (37.2 [name]) (02/22 0625) Pulse Rate:  [89-113] 89  (02/22 0625) Resp:  [18] 18  (02/22 0625) BP: (122-154)/(92-110) 154/110 mmHg (02/22 0625) SpO2:  [97 %-100 %] 97 % (02/22 0625) Weight change:  Last BM Date: 07/30/11  Intake/Output from previous day: 02/21 0701 - 02/22 0700 In: 1164 [I.V.:1164] Out: 900 [Urine:900]     Physical Exam: General: Alert, awake, oriented x3, in no acute distress. HEENT: No bruits, no goiter. Heart: Regular rate and rhythm, without murmurs, rubs, gallops. Lungs: Clear to auscultation bilaterally. Abdomen:  soft, nontender, nondistended, positive bowel sounds. Extremities: No clubbing cyanosis or edema with positive pedal pulses. Neuro: Grossly intact, nonfocal.    Lab Results: Basic Metabolic Panel:  Basename 08/01/11 0540 07/31/11 0530 07/29/11 1737  NA 136 133* --  K 3.8 4.1 --  CL 105 102 --  CO2 23 25 --  GLUCOSE 104* 167* --  BUN 4* 5* --  CREATININE 0.63 0.58 --  CALCIUM 8.5 8.4 --  MG -- -- 1.6  PHOS -- -- --   Liver Function Tests: No results found for this basename: AST:2,ALT:2,ALKPHOS:2,BILITOT:2,PROT:2,ALBUMIN:2 in the last 72 hours  Basename 07/31/11 2300 07/31/11 0530  LIPASE 472* 426*  AMYLASE -- --   CBC:  Basename 07/31/11 0530 07/30/11 0610  WBC 5.0 5.5  NEUTROABS -- --  HGB 15.0 14.5  HCT 42.9 41.3  MCV 90.7 90.6  PLT 288 279   CBG:  Basename 08/01/11 0755 07/31/11 2145 07/31/11 1740 07/31/11 1203 07/31/11 0754 07/30/11 2147  GLUCAP 105* 112* 310* 199* 147* 201*   Hemoglobin A1C: No results found for this basename: HGBA1C in the last 72 hours Urinalysis: No results found for this basename: COLORURINE:2,APPERANCEUR:2,LABSPEC:2,PHURINE:2,GLUCOSEU:2,HGBUR:2,BILIRUBINUR:2,KETONESUR:2,PROTEINUR:2,UROBILINOGEN:2,NITRITE:2,LEUKOCYTESUR:2 in  the last 72 hours  Recent Results (from the past 240 hour(s))  MRSA PCR SCREENING     Status: Abnormal   Collection Time   07/28/11  9:17 PM      Component Value Range Status Comment   MRSA by PCR POSITIVE (*) NEGATIVE  Final     Studies/Results: No results found.  Medications: Scheduled Meds:    . Chlorhexidine Gluconate Cloth  6 each Topical Q0600  . enoxaparin  40 mg Subcutaneous Q24H  . insulin aspart  0-9 Units Subcutaneous TID AC & HS  . mupirocin ointment  1 application Nasal BID  . thiamine  100 mg Oral Daily  . DISCONTD: insulin aspart  0-9 Units Subcutaneous QID   Continuous Infusions:    . sodium chloride 75 mL/hr at 08/01/11 0433   PRN Meds:.bisacodyl, morphine, ondansetron (ZOFRAN) IV, ondansetron, sodium phosphate  Assessment/Plan:  Principal Problem:  *Pancreatitis Active Problems:  Diabetes mellitus  ETOH abuse   #1 Acute on Chronic Pancreatitis with pancreatic Ascites: Peritoneal fluid studies consistent with pancreatic ascites and per GI pre-sinusoidal portal hypertension, his SAAG is less than 1.2. Cell counts not consistent with SBP - MRI findings not concerning for pancreatic mass, but rather look like severe inflammation from his pancreatitis. -ERCP planned in a.m for pancreatic stent placement. -TPN after ERCP recommended,will need PICC line for TPN-will order -Appreciate Dr. Haywood Pao assistance  -patient counseled on importance of cessation of ETOH.  #2Constipation: 2/2 narcotics, resolved on current bowel regimen  #3 DM: fair control.  #4 ETOH abuse: thiamine/folate.   LOS: 4 days   Melvin [name]  [facility] Pager: [phone] 08/01/2011, 11:02 AM

## 2011-08-01 NOTE — Procedures (Signed)
US guided RLQ paracentesis  6.2 Liters removed: bloody fluid Sent for labs per MD  Pt tolerated well BP: 129/80

## 2011-08-02 ENCOUNTER — Encounter (HOSPITAL_COMMUNITY): Payer: Self-pay | Admitting: Gastroenterology

## 2011-08-02 ENCOUNTER — Inpatient Hospital Stay (HOSPITAL_COMMUNITY): Payer: BC Managed Care – PPO

## 2011-08-02 ENCOUNTER — Encounter (HOSPITAL_COMMUNITY)
Admission: EM | Disposition: A | Discharge status: Home / Self Care | Payer: Self-pay | Source: Home / Self Care | Attending: Internal Medicine

## 2011-08-02 HISTORY — PX: ERCP: SHX5425

## 2011-08-02 LAB — GLUCOSE, CAPILLARY
Glucose-Capillary: 119 mg/dL — ABNORMAL HIGH (ref 70–99)
Glucose-Capillary: 132 mg/dL — ABNORMAL HIGH (ref 70–99)

## 2011-08-02 LAB — BASIC METABOLIC PANEL
BUN: 4 mg/dL — ABNORMAL LOW (ref 6–23)
Calcium: 8 mg/dL — ABNORMAL LOW (ref 8.4–10.5)
Creatinine, Ser: 0.57 mg/dL (ref 0.50–1.35)
GFR calc Af Amer: >90 mL/min (ref >90)
GFR calc non Af Amer: >90 mL/min (ref >90)

## 2011-08-02 LAB — LIPASE, BLOOD: Lipase: 154 U/L — ABNORMAL HIGH (ref 11–59)

## 2011-08-02 SURGERY — ERCP, WITH INTERVENTION IF INDICATED
Anesthesia: Moderate Sedation

## 2011-08-02 MED ORDER — MIDAZOLAM HCL 10 MG/2ML IJ SOLN
INTRAMUSCULAR | Status: DC | PRN
  Administered 2011-08-02 (×5): 2 mg via INTRAVENOUS

## 2011-08-02 MED ORDER — INSULIN ASPART 100 UNIT/ML ~~LOC~~ SOLN
0.0000 [IU] | SUBCUTANEOUS | Status: DC
  Administered 2011-08-03: 2 [IU] via SUBCUTANEOUS
  Administered 2011-08-03: 3 [IU] via SUBCUTANEOUS
  Administered 2011-08-03 (×3): 2 [IU] via SUBCUTANEOUS
  Administered 2011-08-03: 3 [IU] via SUBCUTANEOUS
  Administered 2011-08-04 (×3): 2 [IU] via SUBCUTANEOUS
  Administered 2011-08-04: 3 [IU] via SUBCUTANEOUS
  Administered 2011-08-04 – 2011-08-05 (×2): 2 [IU] via SUBCUTANEOUS
  Administered 2011-08-05: 3 [IU] via SUBCUTANEOUS
  Administered 2011-08-05 (×2): 2 [IU] via SUBCUTANEOUS
  Administered 2011-08-06: 3 [IU] via SUBCUTANEOUS
  Administered 2011-08-06: 2 [IU] via SUBCUTANEOUS
  Administered 2011-08-07: 5 [IU] via SUBCUTANEOUS
  Administered 2011-08-07: 2 [IU] via SUBCUTANEOUS
  Administered 2011-08-07: 5 [IU] via SUBCUTANEOUS
  Administered 2011-08-07: 2 [IU] via SUBCUTANEOUS
  Administered 2011-08-07 (×2): 8 [IU] via SUBCUTANEOUS
  Administered 2011-08-08: 15 [IU] via SUBCUTANEOUS
  Administered 2011-08-08: 5 [IU] via SUBCUTANEOUS
  Administered 2011-08-08: 8 [IU] via SUBCUTANEOUS
  Administered 2011-08-08: 5 [IU] via SUBCUTANEOUS
  Filled 2011-08-02: qty 3

## 2011-08-02 MED ORDER — MIDAZOLAM HCL 10 MG/2ML IJ SOLN
INTRAMUSCULAR | Status: AC
  Filled 2011-08-02: qty 4

## 2011-08-02 MED ORDER — BUTAMBEN-TETRACAINE-BENZOCAINE 2-2-14 % EX AERO
INHALATION_SPRAY | CUTANEOUS | Status: DC | PRN
  Administered 2011-08-02: 2 via TOPICAL

## 2011-08-02 MED ORDER — SODIUM CHLORIDE 0.9 % IJ SOLN: 10.0000 mL | Freq: Two times a day (BID) | INTRAMUSCULAR | Status: DC

## 2011-08-02 MED ORDER — FENTANYL CITRATE 0.05 MG/ML IJ SOLN
INTRAMUSCULAR | Status: DC | PRN
  Administered 2011-08-02 (×5): 25 ug via INTRAVENOUS

## 2011-08-02 MED ORDER — FENTANYL CITRATE 0.05 MG/ML IJ SOLN
INTRAMUSCULAR | Status: AC
  Filled 2011-08-02: qty 4

## 2011-08-02 MED ORDER — GLUCAGON HCL (RDNA) 1 MG IJ SOLR
INTRAMUSCULAR | Status: AC
  Filled 2011-08-02: qty 2

## 2011-08-02 MED ORDER — SODIUM CHLORIDE 0.9 % IJ SOLN
10.0000 mL | INTRAMUSCULAR | Status: DC | PRN
  Administered 2011-08-03 – 2011-08-07 (×5): 10 mL

## 2011-08-02 MED ORDER — SODIUM CHLORIDE 0.9 % IV SOLN
INTRAVENOUS | Status: DC
  Administered 2011-08-02: 21:00:00 via INTRAVENOUS
  Administered 2011-08-03: 20 mL via INTRAVENOUS
  Administered 2011-08-06 – 2011-08-07 (×2): via INTRAVENOUS

## 2011-08-02 MED ORDER — SODIUM CHLORIDE 0.9 % IV SOLN
INTRAVENOUS | Status: DC | PRN
  Administered 2011-08-02: 11:00:00

## 2011-08-02 MED ORDER — DIPHENHYDRAMINE HCL 50 MG/ML IJ SOLN
INTRAMUSCULAR | Status: DC | PRN
  Administered 2011-08-02: 12.5 mg via INTRAVENOUS
  Administered 2011-08-02: 25 mg via INTRAVENOUS

## 2011-08-02 MED ORDER — SODIUM CHLORIDE 0.9 % IV SOLN
1.5000 g | INTRAVENOUS | Status: AC
  Administered 2011-08-02: 1.5 g via INTRAVENOUS
  Filled 2011-08-02: qty 1.5

## 2011-08-02 MED ORDER — INSULIN REGULAR HUMAN 100 UNIT/ML IJ SOLN
INTRAVENOUS | Status: AC
  Administered 2011-08-02: 18:00:00 via INTRAVENOUS
  Filled 2011-08-02: qty 2000

## 2011-08-02 MED ORDER — DIPHENHYDRAMINE HCL 50 MG/ML IJ SOLN
INTRAMUSCULAR | Status: AC
  Filled 2011-08-02: qty 1

## 2011-08-02 MED ORDER — GLUCAGON HCL (RDNA) 1 MG IJ SOLR
INTRAMUSCULAR | Status: DC | PRN
  Administered 2011-08-02: 1 mg via INTRAVENOUS

## 2011-08-02 MED ORDER — AMPICILLIN-SULBACTAM SODIUM 1.5 (1-0.5) G IJ SOLR
1.5000 g | Freq: Three times a day (TID) | INTRAMUSCULAR | Status: DC
  Administered 2011-08-02 – 2011-08-08 (×19): 1.5 g via INTRAVENOUS
  Filled 2011-08-02 (×22): qty 1.5

## 2011-08-02 NOTE — Plan of Care (Signed)
Problem: Inadequate Intake (NI-2.1) Goal: Food and/or nutrient delivery Individualized approach for food/nutrient provision.  Outcome: Not Progressing Pt NPO, scheduled to start TNA tonight

## 2011-08-02 NOTE — Progress Notes (Signed)
Nutrition Consult for TNA/Follow Up  Diet: NPO  -  Pt had ERCP with stent placement today which showed pancreatic duct leak. MD has ordered TNA to start tonight. Abdomen still distended with pancreatic ascites. Lipase trending down.    Meds: Scheduled Meds:   . ampicillin-sulbactam (UNASYN) IV  1.5 g Intravenous To Endo  . ampicillin-sulbactam (UNASYN) IV  1.5 g Intravenous Q8H  . Chlorhexidine Gluconate Cloth  6 each Topical Q0600  . enoxaparin  40 mg Subcutaneous Q24H  . insulin aspart  0-15 Units Subcutaneous Q4H  . mupirocin ointment  1 application Nasal BID  . sodium chloride  10-40 mL Intracatheter Q12H  . thiamine  100 mg Oral Daily  . DISCONTD: insulin aspart  0-9 Units Subcutaneous TID AC & HS   Continuous Infusions:   . sodium chloride 75 mL/hr at 08/02/11 0702  . sodium chloride     PRN Meds:.bisacodyl, morphine, Omnipaque 350 mg/mL (50 mL) in 0.9% normal saline (50 mL), ondansetron (ZOFRAN) IV, ondansetron, sodium chloride, sodium phosphate, DISCONTD: butamben-tetracaine-benzocaine, DISCONTD: diphenhydrAMINE, DISCONTD: fentaNYL, DISCONTD: glucagon, DISCONTD: midazolam  Labs:  CMP     Component Value Date/Time   NA 136 08/02/2011 0700   K 3.7 08/02/2011 0700   CL 107 08/02/2011 0700   CO2 24 08/02/2011 0700   GLUCOSE 110* 08/02/2011 0700   BUN 4* 08/02/2011 0700   CREATININE 0.57 08/02/2011 0700   CALCIUM 8.0* 08/02/2011 0700   PROT 5.9* 08/01/2011 0945   ALBUMIN 1.8* 08/01/2011 0945   AST 11 07/29/2011 0530   ALT <5 07/29/2011 0530   ALKPHOS 117 07/29/2011 0530   BILITOT 0.6 07/29/2011 0530   GFRNONAA >90 08/02/2011 0700   GFRAA >90 08/02/2011 0700   Lipase 154 today, improved from 851 on 2/18  CBG (last 3)   Basename 08/01/11 2217 08/01/11 1743 08/01/11 1227  GLUCAP 195* 231* 224*     Intake/Output Summary (Last 24 hours) at 08/02/11 1406 Last data filed at 08/02/11 0616  Gross per 24 hour  Intake 2427.7 ml  Output    200 ml  Net 2227.7 ml   Last BM -  07/30/11  Weight Status: No new weights  Re-estimated needs:   2050-2350 calories, 105-120g protein   Nutrition Dx: Inadequate oral intake - ongoing  Goal: Diet advancement with adequate oral intake - not met, pt NPO, scheduled to start TNA  New goal: TNA to meet >90% of estimated nutritional needs.   Intervention: TNA per pharmacy.   Monitor: Weights, labs, TNA advancement   Marshall Cork Pager #:  [phone]

## 2011-08-02 NOTE — Progress Notes (Signed)
PARENTERAL NUTRITION CONSULT NOTE - INITIAL  Pharmacy Consult for TPN Indication: Pancreatic ascites, s/p ERCP with stent placement  No Known Allergies  Patient Measurements: Height: 5\' 8"  (172.7 cm) Weight: 161 lb 13.1 oz (73.4 kg) (rn notified) IBW/kg (Calculated) : 68.4  Usual Weight: 66  Vital Signs: Temp: 98.3 F (36.8 C) (02/23 0924) Temp src: Oral (02/23 0924) BP: 119/86 mmHg (02/23 1120) Pulse Rate: 95  (02/23 0607) Intake/Output from previous day: 02/22 0701 - 02/23 0700 In: 2427.7 [P.O.:100; I.V.:2327.7] Out: 200 [Urine:200] Intake/Output from this shift:    Labs:  Basename 07/31/11 0530  WBC 5.0  HGB 15.0  HCT 42.9  PLT 288  APTT --  INR --     Basename 08/02/11 0700 08/01/11 0945 08/01/11 0540 07/31/11 0530  NA 136 -- 136 133*  K 3.7 -- 3.8 4.1  CL 107 -- 105 102  CO2 24 -- 23 25  GLUCOSE 110* -- 104* 167*  BUN 4* -- 4* 5*  CREATININE 0.57 -- 0.63 0.58  LABCREA -- -- -- --  CREAT24HRUR -- -- -- --  CALCIUM 8.0* -- 8.5 8.4  MG -- -- -- --  PHOS -- -- -- --  PROT -- 5.9* -- --  ALBUMIN -- 1.8* -- --  AST -- -- -- --  ALT -- -- -- --  ALKPHOS -- -- -- --  BILITOT -- -- -- --  BILIDIR -- -- -- --  IBILI -- -- -- --  PREALBUMIN -- -- -- --  TRIG -- -- -- --  CHOLHDL -- -- -- --  CHOL -- -- -- --   Estimated Creatinine Clearance: 104.5 ml/min (by C-G formula based on Cr of 0.57).    Basename 08/01/11 2217 08/01/11 1743 08/01/11 1227  GLUCAP 195* 231* 224*    Medical History: Past Medical History  Diagnosis Date  . Diabetes mellitus   . Meningitis   . Pneumonia   . GERD (gastroesophageal reflux disease)   . Pancreatitis   . Angina     Medications:  Prescriptions prior to admission  Medication Sig Dispense Refill  . folic acid (FOLVITE) 1 MG tablet Take 1 tablet (1 mg total) by mouth daily.  30 tablet  0  . glipiZIDE (GLUCOTROL) 10 MG tablet Take 20 mg by mouth daily.        . insulin glargine (LANTUS) 100 UNIT/ML injection  Inject 28 Units into the skin at bedtime.        . insulin glulisine (APIDRA) 100 UNIT/ML injection Inject 6 Units into the skin daily after breakfast.        . metFORMIN (GLUCOPHAGE) 1000 MG tablet Take 1,000 mg by mouth 2 (two) times daily with a meal.        . metoCLOPramide (REGLAN) 5 MG tablet Take 5 mg by mouth 4 (four) times daily -  before meals and at bedtime.      . Multiple Vitamin (MULITIVITAMIN WITH MINERALS) TABS Take 1 tablet by mouth daily.  30 tablet  0  . pantoprazole (PROTONIX) 40 MG tablet Take 1 tablet (40 mg total) by mouth daily.  30 tablet  0  . thiamine 100 MG tablet Take 1 tablet (100 mg total) by mouth daily.  30 tablet  0    Insulin Requirements in the past 24 hours:  9 units Novolog  Current Nutrition:  N/A - patient was NPO  Assessment: 51 year old man with a history of chronic pancreatitis due to ETOH s/p ERCP with  stenting today for pancreatic duct leak.  TPN to start for nutrition support.  Sodium and potassium WNL today.  CBG have been elevated and I expect he will need insulin added to his TPN for glycemic control  Nutritional Goals:  2050-2350 kCal, 105-120 grams of protein per day  Plan:  Start Clinimix E 5/15 at 45mL/hr.  Add 20 units of insulin to the bag.  Will add MVI, trace elements and Lipids on Monday-Wednesday-Friday only due to a Sport and exercise psychologist of these items.  TPN labs tomorrow and every Monday and Thursday.  Will also check CBGs q4h and administer SSI.  Will also decrease iv fluids from 25mL/hr to 73mL/hr after TPN starts.  Mickeal Skinner 08/02/2011,1:49 PM

## 2011-08-02 NOTE — Interval H&P Note (Signed)
History and Physical Interval Note:  08/02/2011 9:41 AM  Melvin [name]  has presented today for surgery, with the diagnosis of Pancreatic ascites  The various methods of treatment have been discussed with the patient and family. After consideration of risks, benefits and other options for treatment, the patient has consented to  Procedure(s) (LRB): ENDOSCOPIC RETROGRADE CHOLANGIOPANCREATOGRAPHY (ERCP) (N/A) as a surgical intervention .  The patients' history has been reviewed, patient examined, no change in status, stable for surgery.  I have reviewed the patients' chart and labs.  Questions were answered to the patient's satisfaction.     Eshawn Coor D

## 2011-08-02 NOTE — Op Note (Signed)
[facility] [address] [place], [place]  [address]  ERCP PROCEDURE REPORT  PATIENT:  Atari, Novick  MR#:  [id number] BIRTHDATE:  1960-11-26  GENDER:  male ENDOSCOPIST:  Jeani Hawking, MD ASSISTANT:  Loleta Rose. Cottie Banda, RN, Kandice Robinsons PROCEDURE DATE:  08/02/2011 PROCEDURE:  ERCP with stent placement ASA CLASS:  Class III INDICATIONS:  Pancreatic duct leak  MEDICATIONS:   Fentanyl 125 mcg IV, Versed 10 mg IV, Benadryl 37.5 mg IV, glucagon 1 mg IV  DESCRIPTION OF PROCEDURE:   After the risks benefits and alternatives of the procedure were thoroughly explained, informed consent was obtained.  The PENTAX [id number] and [id number]) endoscope was introduced through the mouth and advanced to the second portion of the duodenum.  The patient had a large ampulla. After multiple tries the PD was able to be cannulated. The guidewire was extended out into the distal pancreatic duct and then it was curling outside of the duct. Contrast injection revealed filling outside of the pancreas consistent with the pancreatic duct leak. The rest of the pancreas was difficult to opacify because of the leak. A large sphincterotomy was created and a 5 Fr x 7 cm stent was inserted. The stent was not able to cross the duct disruption despite multiple attempts to manipulate the wire across the defect. It is hoped that the stent will decrease the pressure head of pancreatic juice flow preferentially into the duodenum.    The scope was then completely withdrawn from the patient and the procedure terminated. <<PROCEDUREIMAGES>>  COMPLICATIONS:  None  ENDOSCOPIC IMPRESSION: 1) Pancreatic duct leak in the distal PD. RECOMMENDATIONS: 1) Maintain NPO. 2) Start TPN. 3) Reassess stent placement in 3-4 weeks. 4) Possible diuretics in the near future or even the use of octreotide.  ______________________________ Jeani Hawking, MD  n. Rosalie DoctorJeani Hawking  at 08/02/2011 10:59 AM  Fransisca Connors, [phone]

## 2011-08-02 NOTE — Progress Notes (Signed)
LATE ENTRY   Subjective:  S/P ERCP , denies any c/o. Family at bedside  Objective: Vital signs in last 24 hours: Temp:  [97.9 F (36.6 C)-99.1 F (37.3 C)] 98.1 F (36.7 C) (02/23 2222) Pulse Rate:  [89-103] 96  (02/23 2222) Resp:  [14-23] 18  (02/23 2222) BP: (116-170)/(74-119) 143/96 mmHg (02/23 2222) SpO2:  [95 %-100 %] 95 % (02/23 2222) Weight change:  Last BM Date: 08/01/11  Intake/Output from previous day: 02/22 0701 - 02/23 0700 In: 2427.7 [P.O.:100; I.V.:2327.7] Out: 200 [Urine:200] Total I/O In: 400 [I.V.:200; TPN:200] Out: -    Physical Exam: General: drowsy, easily aroused, in no acute distress. HEENT: No bruits, no goiter. Heart: Regular rate and rhythm, without murmurs, rubs, gallops. Lungs: Clear to auscultation bilaterally. Abdomen:  soft, nontender, nondistended, positive bowel sounds. Extremities:no edema, no cyanosis with positive pedal pulses. Neuro: Grossly intact, nonfocal.    Lab Results: Basic Metabolic Panel:  Basename 08/02/11 0700 08/01/11 0540  NA 136 136  K 3.7 3.8  CL 107 105  CO2 24 23  GLUCOSE 110* 104*  BUN 4* 4*  CREATININE 0.57 0.63  CALCIUM 8.0* 8.5  MG -- --  PHOS -- --   Liver Function Tests:  Basename 08/01/11 0945  AST --  ALT --  ALKPHOS --  BILITOT --  PROT 5.9*  ALBUMIN 1.8*    Basename 08/02/11 0700 07/31/11 2300  LIPASE 154* 472*  AMYLASE -- --   CBC:  Basename 07/31/11 0530  WBC 5.0  NEUTROABS --  HGB 15.0  HCT 42.9  MCV 90.7  PLT 288   CBG:  Basename 08/02/11 2015 08/02/11 1653 08/02/11 1226 08/02/11 0737 08/01/11 2217 08/01/11 1743  GLUCAP 119* 81 125* 111* 195* 231*   Hemoglobin A1C: No results found for this basename: HGBA1C in the last 72 hours Urinalysis: No results found for this basename: COLORURINE:2,APPERANCEUR:2,LABSPEC:2,PHURINE:2,GLUCOSEU:2,HGBUR:2,BILIRUBINUR:2,KETONESUR:2,PROTEINUR:2,UROBILINOGEN:2,NITRITE:2,LEUKOCYTESUR:2 in the last 72 hours  Recent Results (from the  past 240 hour(s))  MRSA PCR SCREENING     Status: Abnormal   Collection Time   07/28/11  9:17 PM      Component Value Range Status Comment   MRSA by PCR POSITIVE (*) NEGATIVE  Final   BODY FLUID CULTURE     Status: Normal (Preliminary result)   Collection Time   08/01/11 11:41 AM      Component Value Range Status Comment   Specimen Description FLUID PERITONEAL   Final    Special Requests NONE   Final    Gram Stain     Final    Value: RARE WBC PRESENT,BOTH PMN AND MONONUCLEAR     NO SQUAMOUS EPITHELIAL CELLS SEEN     NO ORGANISMS SEEN   Culture NO GROWTH   Final    Report Status PENDING   Incomplete   AFB CULTURE WITH SMEAR     Status: Normal (Preliminary result)   Collection Time   08/01/11 11:41 AM      Component Value Range Status Comment   Specimen Description FLUID PERITONEAL   Final    Special Requests NONE   Final    ACID FAST SMEAR NO ACID FAST BACILLI SEEN   Final    Culture     Final    Value: CULTURE WILL BE EXAMINED FOR 6 WEEKS BEFORE ISSUING A FINAL REPORT   Report Status PENDING   Incomplete     Studies/Results: US Paracentesis  08/01/2011  *RADIOLOGY REPORT*  Clinical Data: Abdominal ascites  ULTRASOUND GUIDED PARACENTESIS  Comparison:  None  An ultrasound guided paracentesis was thoroughly discussed with the patient and questions answered.  The benefits, risks, alternatives and complications were also discussed.  The patient understands and wishes to proceed with the procedure.  Written consent was obtained.  Ultrasound was performed to localize and mark an adequate pocket of fluid in the right lower quadrant of the abdomen.  The area was then prepped and draped in the normal sterile fashion.  1% Lidocaine was used for local anesthesia.  Under ultrasound guidance a 19 gauge Yueh catheter was introduced.  Paracentesis was performed.  The catheter was removed and a dressing applied.  Complications:  None  Findings:  A total of approximately 6.2 liters of blood-tinged fluid  was removed.  A fluid sample was sent for laboratory analysis.  IMPRESSION: Successful ultrasound guided paracentesis yielding 6.2 liters of ascites.  Read by: Ralene Muskrat, P.A.-C  Original Report Authenticated By: Waynard Reeds, M.D.   Dg Ercp With Sphincterotomy  08/02/2011  *RADIOLOGY REPORT*  Clinical Data: Acute superimposed upon chronic pancreatitis. Ascites.  ERCP 08/02/2011:  Comparison:  MRI abdomen 07/29/2011.  CT abdomen and pelvis 05/28/2011.  Technique: 2 spot images were obtained with the fluoroscopic device and submitted for interpretation post-procedure.  ERCP was performed by Dr. Elnoria Howard.  Findings: Initial image demonstrates a guide wire within the pancreatic duct.  Contrast material is present within the gastric fundus and the duodenum.  Second image demonstrates a pancreatic duct stent.  Contrast material within the gastric fundus and the distal esophagus may indicate gastroesophageal reflux.  The radiologic technologist documented 2 minutes 27 seconds of fluoroscopy time.  IMPRESSION: Pancreatic duct stent placement by Dr. Elnoria Howard.  These images were submitted for radiologic interpretation only. Please see the procedural report for the amount of contrast and the fluoroscopy time utilized.  Original Report Authenticated By: Arnell Sieving, M.D.    Medications: Scheduled Meds:    . ampicillin-sulbactam (UNASYN) IV  1.5 g Intravenous To Endo  . ampicillin-sulbactam (UNASYN) IV  1.5 g Intravenous Q8H  . Chlorhexidine Gluconate Cloth  6 each Topical Q0600  . enoxaparin  40 mg Subcutaneous Q24H  . insulin aspart  0-15 Units Subcutaneous Q4H  . mupirocin ointment  1 application Nasal BID  . sodium chloride  10-40 mL Intracatheter Q12H  . thiamine  100 mg Oral Daily  . DISCONTD: insulin aspart  0-9 Units Subcutaneous TID AC & HS   Continuous Infusions:    . sodium chloride 75 mL/hr at 08/02/11 0702  . sodium chloride 25 mL/hr at 08/02/11 2044  . TPN (CLINIMIX) +/- additives  50 mL/hr at 08/02/11 1823   PRN Meds:.bisacodyl, morphine, Omnipaque 350 mg/mL (50 mL) in 0.9% normal saline (50 mL), ondansetron (ZOFRAN) IV, ondansetron, sodium chloride, DISCONTD: butamben-tetracaine-benzocaine, DISCONTD: diphenhydrAMINE, DISCONTD: fentaNYL, DISCONTD: glucagon, DISCONTD: midazolam  Assessment/Plan:  Principal Problem:  *Pancreatitis Active Problems:  Diabetes mellitus  ETOH abuse   #1 Acute on Chronic Pancreatitis with pancreatic Ascites: Peritoneal fluid studies consistent with pancreatic ascites and per GI pre-sinusoidal portal hypertension, his SAAG is less than 1.2. Cell counts not consistent with SBP - MRI findings not concerning for pancreatic mass, but rather look like severe inflammation from his pancreatitis. -s/p ERCP>pancreatic duct leak. -Appreciate Dr. Haywood Pao assistance  -patient counseled on importance of cessation of ETOH. #2Pancreatic duct leak- in distal duct per ERCP, S/P stent placement - stent placement to be reassessed in 3-4wks - TPN beginning today - pt to be kept NPO #  3Constipation: 2/2 narcotics, resolved on current bowel regimen  #4 DM: fair control.  #5 ETOH abuse: thiamine/folate. #6?Portosplenic thrombosis-acute vs chronic Will discuss with GI ?recommendations for further evaluation and if long term anticoagulation also keeping in mind his alcohol abuse which makes him not a very good candidate for anticoagulation.    LOS: 5 days   Kela Millin [facility] Pager: [phone] 08/02/2011, 11:23 PM

## 2011-08-02 NOTE — H&P (View-Only) (Signed)
Patient ID: Melvin [name], male   DOB: Apr 06, 1961, 51 y.o.   MRN: [id number] Subjective: No acute events.  Objective: Vital signs in last 24 hours: Temp:  [97.7 F (36.5 C)-99 F (37.2 C)] 98.6 F (37 C) (02/22 1345) Pulse Rate:  [89-113] 102  (02/22 1345) Resp:  [18] 18  (02/22 1345) BP: (117-154)/(78-110) 136/96 mmHg (02/22 1400) SpO2:  [97 %-98 %] 98 % (02/22 1345) Last BM Date: 07/30/11  Intake/Output from previous day: 02/21 0701 - 02/22 0700 In: 1164 [I.V.:1164] Out: 900 [Urine:900] Intake/Output this shift:    General appearance: alert and no distress GI: soft, distended with ascites  Lab Results:  Basename 07/31/11 0530 07/30/11 0610  WBC 5.0 5.5  HGB 15.0 14.5  HCT 42.9 41.3  PLT 288 279   BMET  Basename 08/01/11 0540 07/31/11 0530 07/30/11 0610  NA 136 133* 136  K 3.8 4.1 3.7  CL 105 102 106  CO2 23 25 25   GLUCOSE 104* 167* 115*  BUN 4* 5* 6  CREATININE 0.63 0.58 0.61  CALCIUM 8.5 8.4 8.2*   LFT  Basename 08/01/11 0945  PROT 5.9*  ALBUMIN 1.8*  AST --  ALT --  ALKPHOS --  BILITOT --  BILIDIR --  IBILI --   PT/INR No results found for this basename: LABPROT:2,INR:2 in the last 72 hours Hepatitis Panel No results found for this basename: HEPBSAG,HCVAB,HEPAIGM,HEPBIGM in the last 72 hours C-Diff No results found for this basename: CDIFFTOX:3 in the last 72 hours Fecal Lactopherrin No results found for this basename: FECLLACTOFRN in the last 72 hours  Studies/Results: US Paracentesis  08/01/2011  *RADIOLOGY REPORT*  Clinical Data: Abdominal ascites  ULTRASOUND GUIDED PARACENTESIS  Comparison:  None  An ultrasound guided paracentesis was thoroughly discussed with the patient and questions answered.  The benefits, risks, alternatives and complications were also discussed.  The patient understands and wishes to proceed with the procedure.  Written consent was obtained.  Ultrasound was performed to localize and mark an adequate pocket of fluid  in the right lower quadrant of the abdomen.  The area was then prepped and draped in the normal sterile fashion.  1% Lidocaine was used for local anesthesia.  Under ultrasound guidance a 19 gauge Yueh catheter was introduced.  Paracentesis was performed.  The catheter was removed and a dressing applied.  Complications:  None  Findings:  A total of approximately 6.2 liters of blood-tinged fluid was removed.  A fluid sample was sent for laboratory analysis.  IMPRESSION: Successful ultrasound guided paracentesis yielding 6.2 liters of ascites.  Read by: Ralene Muskrat, P.A.-C  Original Report Authenticated By: Waynard Reeds, M.D.    Medications:  Scheduled:   . Chlorhexidine Gluconate Cloth  6 each Topical Q0600  . enoxaparin  40 mg Subcutaneous Q24H  . insulin aspart  0-9 Units Subcutaneous TID AC & HS  . mupirocin ointment  1 application Nasal BID  . thiamine  100 mg Oral Daily  . DISCONTD: insulin aspart  0-9 Units Subcutaneous QID   Continuous:   . sodium chloride 75 mL/hr at 08/01/11 4098    Assessment/Plan: 1) Pancreatic ascites   At this time it appears that he has a pancreatic ascites with presinusoidal portal HTN.  His total protein is elevated as well as his amylase.  Also his SAAG is less than 1.2.   Treatment is two fold at this time before pursuing surgery - Stenting via ERCP and TPN.  Diuretics can help.  Plan: 1) ERCP tomorrow with pancreatic stent placement. 2) NPO with TPN after the ERCP.  LOS: 4 days   Lucrezia Dehne D 08/01/2011, 4:17 PM

## 2011-08-03 LAB — COMPREHENSIVE METABOLIC PANEL
Albumin: 1.4 g/dL — ABNORMAL LOW (ref 3.5–5.2)
BUN: 6 mg/dL (ref 6–23)
Chloride: 111 mEq/L (ref 96–112)
Creatinine, Ser: 0.6 mg/dL (ref 0.50–1.35)
Total Bilirubin: 0.4 mg/dL (ref 0.3–1.2)
Total Protein: 4.9 g/dL — ABNORMAL LOW (ref 6.0–8.3)

## 2011-08-03 LAB — MAGNESIUM: Magnesium: 1.6 mg/dL (ref 1.5–2.5)

## 2011-08-03 LAB — TRIGLYCERIDES: Triglycerides: 58 mg/dL (ref <150)

## 2011-08-03 LAB — GLUCOSE, CAPILLARY
Glucose-Capillary: 138 mg/dL — ABNORMAL HIGH (ref 70–99)
Glucose-Capillary: 150 mg/dL — ABNORMAL HIGH (ref 70–99)

## 2011-08-03 LAB — PHOSPHORUS: Phosphorus: 2.9 mg/dL (ref 2.3–4.6)

## 2011-08-03 MED ORDER — FUROSEMIDE 40 MG PO TABS
40.0000 mg | ORAL_TABLET | Freq: Two times a day (BID) | ORAL | Status: DC
  Administered 2011-08-04 (×2): 40 mg via ORAL
  Filled 2011-08-03 (×5): qty 1

## 2011-08-03 MED ORDER — MORPHINE SULFATE 2 MG/ML IJ SOLN
1.0000 mg | INTRAMUSCULAR | Status: DC | PRN
  Administered 2011-08-03: 1 mg via INTRAVENOUS
  Administered 2011-08-03 – 2011-08-06 (×4): 2 mg via INTRAVENOUS
  Filled 2011-08-03 (×4): qty 1

## 2011-08-03 MED ORDER — INSULIN REGULAR HUMAN 100 UNIT/ML IJ SOLN
INTRAVENOUS | Status: AC
  Administered 2011-08-03: 18:00:00 via INTRAVENOUS
  Filled 2011-08-03: qty 2000

## 2011-08-03 MED ORDER — MORPHINE SULFATE 2 MG/ML IJ SOLN
INTRAMUSCULAR | Status: AC
  Administered 2011-08-03: 1 mg via INTRAVENOUS
  Filled 2011-08-03: qty 1

## 2011-08-03 MED ORDER — MORPHINE SULFATE 2 MG/ML IJ SOLN: 1.0000 mg | INTRAMUSCULAR | Status: DC | PRN

## 2011-08-03 NOTE — Progress Notes (Signed)
Subjective:  C/o of abd pain-across mid abd, crampy, denies vomiting, +nausea Feels like abd girth beginning to increase again s/p paracentesis on 2/22 Objective: Vital signs in last 24 hours: Temp:  [97.9 F (36.6 C)-99.1 F (37.3 C)] 98.8 F (37.1 C) (02/24 0610) Pulse Rate:  [93-103] 102  (02/24 0610) Resp:  [18] 18  (02/24 0610) BP: (116-143)/(74-99) 134/93 mmHg (02/24 0610) SpO2:  [95 %-100 %] 98 % (02/24 0610) Weight:  [75.751 kg (167 lb)] 75.751 kg (167 lb) (02/24 0804) Weight change:  Last BM Date: 08/03/11  Intake/Output from previous day: 02/23 0701 - 02/24 0700 In: 1640.8 [I.V.:1070; TPN:570.8] Out: 200 [Urine:200]     Physical Exam: General: alert and oriented x3, in no acute distress. HEENT: No bruits, no goiter. Heart: Regular rate and rhythm, without murmurs, rubs, gallops. Lungs: Clear to auscultation bilaterally. Abdomen:  soft, nontender, flank fullness present, mild tenderness in mid abd, positive bowel sounds. Extremities: +1edema, no cyanosis with positive pedal pulses. Neuro: Grossly intact, nonfocal.    Lab Results: Basic Metabolic Panel:  Basename 08/03/11 0535 08/02/11 0700  NA 139 136  K 3.7 3.7  CL 111 107  CO2 23 24  GLUCOSE 129* 110*  BUN 6 4*  CREATININE 0.60 0.57  CALCIUM 8.1* 8.0*  MG 1.6 --  PHOS 2.9 --   Liver Function Tests:  Basename 08/03/11 0535 08/01/11 0945  AST 14 --  ALT <5 --  ALKPHOS 62 --  BILITOT 0.4 --  PROT 4.9* 5.9*  ALBUMIN 1.4* 1.8*    Basename 08/02/11 0700 07/31/11 2300  LIPASE 154* 472*  AMYLASE -- --   CBC: No results found for this basename: WBC:2,NEUTROABS:2,HGB:2,HCT:2,MCV:2,PLT:2 in the last 72 hours CBG:  Basename 08/03/11 1227 08/03/11 0749 08/03/11 0431 08/02/11 2359 08/02/11 2015 08/02/11 1653  GLUCAP 144* 169* 138* 132* 119* 81   Hemoglobin A1C: No results found for this basename: HGBA1C in the last 72 hours Urinalysis: No results found for this basename:  COLORURINE:2,APPERANCEUR:2,LABSPEC:2,PHURINE:2,GLUCOSEU:2,HGBUR:2,BILIRUBINUR:2,KETONESUR:2,PROTEINUR:2,UROBILINOGEN:2,NITRITE:2,LEUKOCYTESUR:2 in the last 72 hours  Recent Results (from the past 240 hour(s))  MRSA PCR SCREENING     Status: Abnormal   Collection Time   07/28/11  9:17 PM      Component Value Range Status Comment   MRSA by PCR POSITIVE (*) NEGATIVE  Final   BODY FLUID CULTURE     Status: Normal (Preliminary result)   Collection Time   08/01/11 11:41 AM      Component Value Range Status Comment   Specimen Description FLUID PERITONEAL   Final    Special Requests NONE   Final    Gram Stain     Final    Value: RARE WBC PRESENT,BOTH PMN AND MONONUCLEAR     NO SQUAMOUS EPITHELIAL CELLS SEEN     NO ORGANISMS SEEN   Culture NO GROWTH   Final    Report Status PENDING   Incomplete   AFB CULTURE WITH SMEAR     Status: Normal (Preliminary result)   Collection Time   08/01/11 11:41 AM      Component Value Range Status Comment   Specimen Description FLUID PERITONEAL   Final    Special Requests NONE   Final    ACID FAST SMEAR NO ACID FAST BACILLI SEEN   Final    Culture     Final    Value: CULTURE WILL BE EXAMINED FOR 6 WEEKS BEFORE ISSUING A FINAL REPORT   Report Status PENDING   Incomplete  Studies/Results: Dg Ercp With Sphincterotomy  08/02/2011  *RADIOLOGY REPORT*  Clinical Data: Acute superimposed upon chronic pancreatitis. Ascites.  ERCP 08/02/2011:  Comparison:  MRI abdomen 07/29/2011.  CT abdomen and pelvis 05/28/2011.  Technique: 2 spot images were obtained with the fluoroscopic device and submitted for interpretation post-procedure.  ERCP was performed by Dr. Elnoria Howard.  Findings: Initial image demonstrates a guide wire within the pancreatic duct.  Contrast material is present within the gastric fundus and the duodenum.  Second image demonstrates a pancreatic duct stent.  Contrast material within the gastric fundus and the distal esophagus may indicate gastroesophageal reflux.   The radiologic technologist documented 2 minutes 27 seconds of fluoroscopy time.  IMPRESSION: Pancreatic duct stent placement by Dr. Elnoria Howard.  These images were submitted for radiologic interpretation only. Please see the procedural report for the amount of contrast and the fluoroscopy time utilized.  Original Report Authenticated By: Arnell Sieving, M.D.    Medications: Scheduled Meds:    . ampicillin-sulbactam (UNASYN) IV  1.5 g Intravenous Q8H  . enoxaparin  40 mg Subcutaneous Q24H  . insulin aspart  0-15 Units Subcutaneous Q4H  . mupirocin ointment  1 application Nasal BID  . sodium chloride  10-40 mL Intracatheter Q12H  . thiamine  100 mg Oral Daily  . DISCONTD: insulin aspart  0-9 Units Subcutaneous TID AC & HS   Continuous Infusions:    . sodium chloride 75 mL/hr at 08/02/11 0702  . sodium chloride 20 mL/hr at 08/03/11 0855  . TPN (CLINIMIX) +/- additives 50 mL/hr at 08/02/11 1823  . TPN (CLINIMIX) +/- additives     PRN Meds:.bisacodyl, morphine, Omnipaque 350 mg/mL (50 mL) in 0.9% normal saline (50 mL), ondansetron (ZOFRAN) IV, ondansetron, sodium chloride, DISCONTD: morphine, DISCONTD: morphine  Assessment/Plan:  Principal Problem:  *Pancreatitis Active Problems:  Diabetes mellitus  ETOH abuse   #1 Acute on Chronic Pancreatitis: Peritoneal fluid studies consistent with pancreatic ascites and per GI pre-sinusoidal portal hypertension, his SAAG is less than 1.2. Cell counts not consistent with SBP - MRI findings not concerning for pancreatic mass, but rather look like severe inflammation from his pancreatitis. -s/p ERCP>pancreatic duct leak. -per GI new onset abd. Likely 2/2 ERCP induced pancreatitis- he is NPO, on TNA pain management follow and recheck lipase  -was counseled on importance of cessation of ETOH. -Appreciate Dr. Haywood Pao assistance #2Pancreatic duct leak, with pancreatic ascites- in distal duct per ERCP, S/P stent placement - stent placement to be  reassessed in 3-4wks - s/p paracentesis 2/22- but beginning to reaccumulate -will start diuretics as suggested per GI,2/22 note #3Constipation: 2/2 narcotics, resolved on current bowel regimen  #4 DM: fair control.  #5 ETOH abuse: thiamine/folate. -was counselled to quit alcohol #6?Portosplenic thrombosis Discussed with Dr Elnoria Howard and he states no need for anticoagulation.    LOS: 6 days   Kela Millin [facility] Pager: [phone] 08/03/2011, 1:24 PM

## 2011-08-03 NOTE — Progress Notes (Signed)
Patient ID: Melvin [name], male   DOB: 1961-05-06, 51 y.o.   MRN: [id number] Subjective: Cramping abdominal pain.  Objective: Vital signs in last 24 hours: Temp:  [97.9 F (36.6 C)-99.1 F (37.3 C)] 98.8 F (37.1 C) (02/24 0610) Pulse Rate:  [93-103] 102  (02/24 0610) Resp:  [14-23] 18  (02/24 0610) BP: (116-170)/(74-119) 134/93 mmHg (02/24 0610) SpO2:  [95 %-100 %] 98 % (02/24 0610) Last BM Date: 08/02/11  Intake/Output from previous day: 02/23 0701 - 02/24 0700 In: 1640.8 [I.V.:1070; TPN:570.8] Out: 200 [Urine:200] Intake/Output this shift:    General appearance: alert and mild distress GI: soft, nontender, distended with ascites.  Lab Results: No results found for this basename: WBC:3,HGB:3,HCT:3,PLT:3 in the last 72 hours BMET  Basename 08/03/11 0535 08/02/11 0700 08/01/11 0540  NA 139 136 136  K 3.7 3.7 3.8  CL 111 107 105  CO2 23 24 23   GLUCOSE 129* 110* 104*  BUN 6 4* 4*  CREATININE 0.60 0.57 0.63  CALCIUM 8.1* 8.0* 8.5   LFT  Basename 08/03/11 0535  PROT 4.9*  ALBUMIN 1.4*  AST 14  ALT <5  ALKPHOS 62  BILITOT 0.4  BILIDIR --  IBILI --   PT/INR No results found for this basename: LABPROT:2,INR:2 in the last 72 hours Hepatitis Panel No results found for this basename: HEPBSAG,HCVAB,HEPAIGM,HEPBIGM in the last 72 hours C-Diff No results found for this basename: CDIFFTOX:3 in the last 72 hours Fecal Lactopherrin No results found for this basename: FECLLACTOFRN in the last 72 hours  Studies/Results: US Paracentesis  08/01/2011  *RADIOLOGY REPORT*  Clinical Data: Abdominal ascites  ULTRASOUND GUIDED PARACENTESIS  Comparison:  None  An ultrasound guided paracentesis was thoroughly discussed with the patient and questions answered.  The benefits, risks, alternatives and complications were also discussed.  The patient understands and wishes to proceed with the procedure.  Written consent was obtained.  Ultrasound was performed to localize and mark an  adequate pocket of fluid in the right lower quadrant of the abdomen.  The area was then prepped and draped in the normal sterile fashion.  1% Lidocaine was used for local anesthesia.  Under ultrasound guidance a 19 gauge Yueh catheter was introduced.  Paracentesis was performed.  The catheter was removed and a dressing applied.  Complications:  None  Findings:  A total of approximately 6.2 liters of blood-tinged fluid was removed.  A fluid sample was sent for laboratory analysis.  IMPRESSION: Successful ultrasound guided paracentesis yielding 6.2 liters of ascites.  Read by: Ralene Muskrat, P.A.-C  Original Report Authenticated By: Waynard Reeds, M.D.   Dg Ercp With Sphincterotomy  08/02/2011  *RADIOLOGY REPORT*  Clinical Data: Acute superimposed upon chronic pancreatitis. Ascites.  ERCP 08/02/2011:  Comparison:  MRI abdomen 07/29/2011.  CT abdomen and pelvis 05/28/2011.  Technique: 2 spot images were obtained with the fluoroscopic device and submitted for interpretation post-procedure.  ERCP was performed by Dr. Elnoria Howard.  Findings: Initial image demonstrates a guide wire within the pancreatic duct.  Contrast material is present within the gastric fundus and the duodenum.  Second image demonstrates a pancreatic duct stent.  Contrast material within the gastric fundus and the distal esophagus may indicate gastroesophageal reflux.  The radiologic technologist documented 2 minutes 27 seconds of fluoroscopy time.  IMPRESSION: Pancreatic duct stent placement by Dr. Elnoria Howard.  These images were submitted for radiologic interpretation only. Please see the procedural report for the amount of contrast and the fluoroscopy time utilized.  Original Report  Authenticated By: Arnell Sieving, M.D.    Medications:  Scheduled:   . ampicillin-sulbactam (UNASYN) IV  1.5 g Intravenous To Endo  . ampicillin-sulbactam (UNASYN) IV  1.5 g Intravenous Q8H  . Chlorhexidine Gluconate Cloth  6 each Topical Q0600  . enoxaparin  40 mg  Subcutaneous Q24H  . insulin aspart  0-15 Units Subcutaneous Q4H  . mupirocin ointment  1 application Nasal BID  . sodium chloride  10-40 mL Intracatheter Q12H  . thiamine  100 mg Oral Daily  . DISCONTD: insulin aspart  0-9 Units Subcutaneous TID AC & HS   Continuous:   . sodium chloride 75 mL/hr at 08/02/11 0702  . sodium chloride 25 mL/hr at 08/02/11 2044  . TPN (CLINIMIX) +/- additives 50 mL/hr at 08/02/11 1823    Assessment/Plan: 1) Pancreatic ascites secondary to a pancreatic duct leak. 2) Probable post ERCP pancreatitis.    The patient most likely has a post-ERCP pancreatitis, which is expected with the manipulation of the pancreatic duct.  Hopefully the stent as well as TPN/NPO status will help to resolve his pancreatic ascites.  Plan:  1) TPN. 2) NPO. 3) Pain control.  LOS: 6 days   Illa Enlow D 08/03/2011, 7:49 AM

## 2011-08-03 NOTE — Progress Notes (Signed)
PARENTERAL NUTRITION CONSULT NOTE - Follow up  Pharmacy Consult for TPN Indication: Pancreatic ascites, s/p ERCP with stent placement  No Known Allergies  Patient Measurements: Height: 5\' 8"  (172.7 cm) Weight: 167 lb (75.751 kg) (RN notified) IBW/kg (Calculated) : 68.4  Usual Weight: 66  Vital Signs: Temp: 98.8 F (37.1 C) (02/24 0610) Temp src: Oral (02/24 0610) BP: 134/93 mmHg (02/24 0610) Pulse Rate: 102  (02/24 0610) Intake/Output from previous day: 02/23 0701 - 02/24 0700 In: 1640.8 [I.V.:1070; TPN:570.8] Out: 200 [Urine:200] Intake/Output from this shift:    Labs: No results found for this basename: WBC:3,HGB:3,HCT:3,PLT:3,APTT:3,INR:3 in the last 72 hours   Basename 08/03/11 0535 08/02/11 0700 08/01/11 0945 08/01/11 0540  NA 139 136 -- 136  K 3.7 3.7 -- 3.8  CL 111 107 -- 105  CO2 23 24 -- 23  GLUCOSE 129* 110* -- 104*  BUN 6 4* -- 4*  CREATININE 0.60 0.57 -- 0.63  LABCREA -- -- -- --  CREAT24HRUR -- -- -- --  CALCIUM 8.1* 8.0* -- 8.5  MG 1.6 -- -- --  PHOS 2.9 -- -- --  PROT 4.9* -- 5.9* --  ALBUMIN 1.4* -- 1.8* --  AST 14 -- -- --  ALT <5 -- -- --  ALKPHOS 62 -- -- --  BILITOT 0.4 -- -- --  BILIDIR -- -- -- --  IBILI -- -- -- --  PREALBUMIN -- -- -- --  TRIG 58 -- -- --  CHOLHDL -- -- -- --  CHOL -- -- -- --   Estimated Creatinine Clearance: 104.5 ml/min (by C-G formula based on Cr of 0.6).    Basename 08/03/11 0749 08/03/11 0431 08/02/11 2359  GLUCAP 169* 138* 132*     Insulin Requirements in the past 24 hours:  8 units Novolog  Current Nutrition:  N/A - patient was NPO  Assessment: 51 year old man with a history of chronic pancreatitis due to ETOH s/p ERCP with stenting today for pancreatic duct leak.  TPN to start for nutrition support.  K 3.7, Mg 1.6, Phos 2.9, TG 58, LFTs WNL.  CBG are better than expected ranging from 119-169 since TPN started. Nutritional Goals:  2050-2350 kCal, 105-120 grams of protein per day  Plan:    Increase Clinimix E 5/15 to 45mL/hr.  This will provide 1363 kcal per day on non lipid days and1843 on Lipid days.  Goal rate is 13mL/hr - will advance to full goal as tolerated. Add 30 units of insulin to the bag.  Will add MVI, trace elements and Lipids on Monday-Wednesday-Friday only due to a Sport and exercise psychologist of these items.  TPN labs tomorrow and every Monday and Thursday.  Will also check CBGs q4h and administer SSI.  Will also decrease iv fluids from 85mL/hr to [facility] after new TPN bag hung.  Mickeal Skinner 08/03/2011,8:41 AM

## 2011-08-04 ENCOUNTER — Encounter (HOSPITAL_COMMUNITY): Payer: Self-pay | Admitting: Gastroenterology

## 2011-08-04 LAB — TRIGLYCERIDES: Triglycerides: 40 mg/dL (ref <150)

## 2011-08-04 LAB — GLUCOSE, CAPILLARY
Glucose-Capillary: 135 mg/dL — ABNORMAL HIGH (ref 70–99)
Glucose-Capillary: 159 mg/dL — ABNORMAL HIGH (ref 70–99)
Glucose-Capillary: 121 mg/dL — ABNORMAL HIGH (ref 70–99)

## 2011-08-04 LAB — COMPREHENSIVE METABOLIC PANEL
ALT: <5 U/L (ref 0–53)
Alkaline Phosphatase: 60 U/L (ref 39–117)
CO2: 22 mEq/L (ref 19–32)
Chloride: 108 mEq/L (ref 96–112)
GFR calc Af Amer: >90 mL/min (ref >90)
Glucose, Bld: 146 mg/dL — ABNORMAL HIGH (ref 70–99)
Potassium: 3.9 mEq/L (ref 3.5–5.1)
Sodium: 136 mEq/L (ref 135–145)
Total Protein: 4.9 g/dL — ABNORMAL LOW (ref 6.0–8.3)

## 2011-08-04 LAB — BODY FLUID CULTURE

## 2011-08-04 LAB — CBC
MCH: 31.4 pg (ref 26.0–34.0)
MCV: 90.4 fL (ref 78.0–100.0)
Platelets: 246 10*3/uL (ref 150–400)
RBC: 4.39 MIL/uL (ref 4.22–5.81)

## 2011-08-04 LAB — DIFFERENTIAL
Basophils Absolute: 0 10*3/uL (ref 0.0–0.1)
Basophils Relative: 0 % (ref 0–1)
Monocytes Absolute: 1.6 10*3/uL — ABNORMAL HIGH (ref 0.1–1.0)

## 2011-08-04 MED ORDER — M.V.I. ADULT IV INJ
INJECTION | INTRAVENOUS | Status: AC
  Administered 2011-08-04: 17:00:00 via INTRAVENOUS
  Filled 2011-08-04: qty 2280

## 2011-08-04 MED ORDER — CHLORHEXIDINE GLUCONATE 0.12 % MT SOLN
15.0000 mL | Freq: Two times a day (BID) | OROMUCOSAL | Status: DC
  Administered 2011-08-04 – 2011-08-08 (×10): 15 mL via OROMUCOSAL
  Filled 2011-08-04 (×9): qty 15

## 2011-08-04 MED ORDER — FAT EMULSION 20 % IV EMUL
250.0000 mL | INTRAVENOUS | Status: AC
  Administered 2011-08-04: 250 mL via INTRAVENOUS
  Filled 2011-08-04: qty 250

## 2011-08-04 MED ORDER — BIOTENE DRY MOUTH MT LIQD
15.0000 mL | Freq: Two times a day (BID) | OROMUCOSAL | Status: DC
  Administered 2011-08-04 – 2011-08-08 (×8): 15 mL via OROMUCOSAL

## 2011-08-04 NOTE — Progress Notes (Signed)
Subjective:  States no further abdominal pain since last p.m., denies nausea vomiting. Complaining of feeling hungry. Objective: Vital signs in last 24 hours: Temp:  [98.1 F (36.7 C)-99.1 F (37.3 C)] 98.1 F (36.7 C) (02/25 1518) Pulse Rate:  [92-112] 92  (02/25 1518) Resp:  [18-20] 20  (02/25 1518) BP: (121-137)/(87-90) 121/90 mmHg (02/25 1518) SpO2:  [94 %-99 %] 99 % (02/25 1518) Weight change:  Last BM Date: 08/03/11  Intake/Output from previous day: 02/24 0701 - 02/25 0700 In: 2284 [I.V.:725; TPN:1559] Out: 800 [Urine:800] Total I/O In: 918 [I.V.:167; IV Piggyback:50; TPN:701] Out: -    Physical Exam: General: alert and oriented x3, in no acute distress. HEENT: No bruits, no goiter. Heart: Regular rate and rhythm, without murmurs, rubs, gallops. Lungs: Clear to auscultation bilaterally. Abdomen:  soft, nontender, flank fullness present/c/w ascites, mild tenderness in mid abd, positive bowel sounds. Extremities: +1edema, no cyanosis with positive pedal pulses. Neuro: Grossly intact, nonfocal.    Lab Results: Basic Metabolic Panel:  Basename 08/04/11 0615 08/03/11 0535  NA 136 139  K 3.9 3.7  CL 108 111  CO2 22 23  GLUCOSE 146* 129*  BUN 10 6  CREATININE 0.58 0.60  CALCIUM 8.2* 8.1*  MG 1.8 1.6  PHOS 3.0 2.9   Liver Function Tests:  Basename 08/04/11 0615 08/03/11 0535  AST 11 14  ALT <5 <5  ALKPHOS 60 62  BILITOT 0.3 0.4  PROT 4.9* 4.9*  ALBUMIN 1.2* 1.4*    Basename 08/04/11 0615 08/02/11 0700  LIPASE 95* 154*  AMYLASE -- --   CBC:  Basename 08/04/11 0615  WBC 8.3  NEUTROABS 6.2  HGB 13.8  HCT 39.7  MCV 90.4  PLT 246   CBG:  Basename 08/04/11 1626 08/04/11 1226 08/04/11 0805 08/04/11 0427 08/04/11 0011 08/03/11 2020  GLUCAP 102* 121* 159* 135* 131* 150*   Hemoglobin A1C: No results found for this basename: HGBA1C in the last 72 hours Urinalysis: No results found for this basename:  COLORURINE:2,APPERANCEUR:2,LABSPEC:2,PHURINE:2,GLUCOSEU:2,HGBUR:2,BILIRUBINUR:2,KETONESUR:2,PROTEINUR:2,UROBILINOGEN:2,NITRITE:2,LEUKOCYTESUR:2 in the last 72 hours  Recent Results (from the past 240 hour(s))  MRSA PCR SCREENING     Status: Abnormal   Collection Time   07/28/11  9:17 PM      Component Value Range Status Comment   MRSA by PCR POSITIVE (*) NEGATIVE  Final   BODY FLUID CULTURE     Status: Normal   Collection Time   08/01/11 11:41 AM      Component Value Range Status Comment   Specimen Description FLUID PERITONEAL   Final    Special Requests NONE   Final    Gram Stain     Final    Value: RARE WBC PRESENT,BOTH PMN AND MONONUCLEAR     NO ORGANISMS SEEN   Culture NO GROWTH 3 DAYS   Final    Report Status 08/04/2011 FINAL   Final   AFB CULTURE WITH SMEAR     Status: Normal (Preliminary result)   Collection Time   08/01/11 11:41 AM      Component Value Range Status Comment   Specimen Description FLUID PERITONEAL   Final    Special Requests NONE   Final    ACID FAST SMEAR NO ACID FAST BACILLI SEEN   Final    Culture     Final    Value: CULTURE WILL BE EXAMINED FOR 6 WEEKS BEFORE ISSUING A FINAL REPORT   Report Status PENDING   Incomplete     Studies/Results: No results found.  Medications: Scheduled Meds:    . ampicillin-sulbactam (UNASYN) IV  1.5 g Intravenous Q8H  . antiseptic oral rinse  15 mL Mouth Rinse q12n4p  . chlorhexidine  15 mL Mouth Rinse BID  . enoxaparin  40 mg Subcutaneous Q24H  . furosemide  40 mg Oral BID  . insulin aspart  0-15 Units Subcutaneous Q4H  . sodium chloride  10-40 mL Intracatheter Q12H  . thiamine  100 mg Oral Daily   Continuous Infusions:    . sodium chloride 20 mL/hr at 08/04/11 1520  . fat emulsion 250 mL (08/04/11 1705)  . TPN (CLINIMIX) +/- additives 80 mL/hr at 08/04/11 1520  . TPN (CLINIMIX) +/- additives 95 mL/hr at 08/04/11 1705   PRN Meds:.bisacodyl, morphine, Omnipaque 350 mg/mL (50 mL) in 0.9% normal saline (50 mL),  ondansetron (ZOFRAN) IV, ondansetron, sodium chloride  Assessment/Plan:  Principal Problem:  *Pancreatitis Active Problems:  Diabetes mellitus  ETOH abuse   #1 Acute on Chronic Pancreatitis: Peritoneal fluid studies consistent with pancreatic ascites and per GI pre-sinusoidal portal hypertension, his SAAG is less than 1.2. Cell counts not consistent with SBP - MRI findings not concerning for pancreatic mass, but rather look like severe inflammation from his pancreatitis. -s/p ERCP>pancreatic duct leak. -Abdominal pain now resolved, and lipase improving  -was counseled on importance of cessation of ETOH. -Appreciate Dr. Haywood Pao assistance #2Pancreatic duct leak, with pancreatic ascites- in distal duct per ERCP, S/P stent placement - stent placement to be reassessed in 3-4wks - s/p paracentesis 2/22 -Continue Lasix, follow. #3Constipation: 2/2 narcotics, resolved on current bowel regimen  #4 DM: fair control.  #5 ETOH abuse: thiamine/folate. -was counselled to quit alcohol #6?Portosplenic thrombosis Discussed with Dr Elnoria Howard and he states no need for anticoagulation.  -GI to advise when okay to DC for outpatient followup from his standpoint.   LOS: 7 days   Kela Millin [facility] Pager: [phone] 08/04/2011, 6:17 PM

## 2011-08-04 NOTE — Progress Notes (Signed)
Utilization Review Completed.Trason Shifflet T2/25/2013   

## 2011-08-04 NOTE — Progress Notes (Signed)
Subjective: Patient having any abdominal pain, nausea or vomiting. He wants something to eat.   Objective: Vital signs in last 24 hours: Temp:  [98.1 F (36.7 C)-99.1 F (37.3 C)] 98.1 F (36.7 C) (02/25 1518) Pulse Rate:  [92-112] 92  (02/25 1518) Resp:  [18-20] 20  (02/25 1518) BP: (121-137)/(87-90) 121/90 mmHg (02/25 1518) SpO2:  [94 %-99 %] 99 % (02/25 1518) Last BM Date: 08/03/11 Intake/Output from previous day: 02/24 0701 - 02/25 0700 In: 2284 [I.V.:725; TPN:1559] Out: 800 [Urine:800] Intake/Output this shift: Total I/O In: 918 [I.V.:167; IV Piggyback:50; TPN:701] Out: -  General appearance: alert, cooperative, appears older than stated age, no distress and moderately obese Resp: clear to auscultation bilaterally Cardio: regular rate and rhythm, S1, S2 normal, no murmur, click, rub or gallop GI: ascites present; bowel sounds normal and no masses palpable Lab Results:  Basename 08/04/11 0615  WBC 8.3  HGB 13.8  HCT 39.7  PLT 246  BMET  Basename 08/04/11 0615 08/03/11 0535 08/02/11 0700  NA 136 139 136  K 3.9 3.7 3.7  CL 108 111 107  CO2 22 23 24   GLUCOSE 146* 129* 110*  BUN 10 6 4*  CREATININE 0.58 0.60 0.57  CALCIUM 8.2* 8.1* 8.0*  LFT  Studies/Results: No results found.  Medications: I have reviewed the patient's current medications.  Assessment/Plan: 1) Pancreatic ascites secondary to ETOH use on TPN-continue present care. Lipase is 95 today. 2) Severe malnutrition: Albumin of 1.2 with a total [protein of 4.9.  3) Diabetes Mellitus: On Insulin.  LOS: 7 days   Melvin [name] 08/04/2011, 5:51 PM

## 2011-08-04 NOTE — Progress Notes (Signed)
PARENTERAL NUTRITION CONSULT NOTE - Follow up  Pharmacy Consult for TPN Indication: Pancreatic ascites, s/p ERCP with stent placement  No Known Allergies  Patient Measurements: Height: 5\' 8"  (172.7 cm) Weight: 167 lb (75.751 kg) (RN notified) IBW/kg (Calculated) : 68.4  Usual Weight: 66  Vital Signs: Temp: 99.1 F (37.3 C) (02/25 0541) BP: 137/90 mmHg (02/25 0541) Pulse Rate: 97  (02/25 0541) Intake/Output from previous day: 02/24 0701 - 02/25 0700 In: 2284 [I.V.:725; TPN:1559] Out: 800 [Urine:800] Intake/Output from this shift:    Labs:  [facility] 08/04/11 0615  WBC 8.3  HGB 13.8  HCT 39.7  PLT 246  APTT --  INR --     Basename 08/04/11 0615 08/03/11 0535 08/02/11 0700  NA 136 139 136  K 3.9 3.7 3.7  CL 108 111 107  CO2 22 23 24   GLUCOSE 146* 129* 110*  BUN 10 6 4*  CREATININE 0.58 0.60 0.57  LABCREA -- -- --  CREAT24HRUR -- -- --  CALCIUM 8.2* 8.1* 8.0*  MG 1.8 1.6 --  PHOS 3.0 2.9 --  PROT 4.9* 4.9* --  ALBUMIN 1.2* 1.4* --  AST 11 14 --  ALT <5 <5 --  ALKPHOS 60 62 --  BILITOT 0.3 0.4 --  BILIDIR -- -- --  IBILI -- -- --  PREALBUMIN 3.0* 4.0* --  TRIG 40 58 --  CHOLHDL -- -- --  CHOL 69 -- --   Estimated Creatinine Clearance: 104.5 ml/min (by C-G formula based on Cr of 0.58).    Basename 08/04/11 1226 08/04/11 0805 08/04/11 0427  GLUCAP 121* 159* 135*     Insulin Requirements in the past 24 hours:  7 units Novolog. 30 units Regular insulin per TNA  bag  Current Nutrition:  NPO- TPN running at 55ml/hr  Assessment: 51 year old man with a history of chronic pancreatitis due to ETOH s/p ERCP with stenting for pancreatic duct leak.  TPN started for nutrition support.  Lytes wnl, pre-albumin 3.0, low but d/t inflammatory process.  CBG wnl. Nutritional Goals:  2050-2350 kCal, 105-120 grams of protein per day  Plan:  Increase Clinimix E 5/15 to 24mL/hr, which is goal for patient.  This will provide average daily 1824 kcal per day and 114g  protein .  Will increase regular insulin to 35 units of insulin per bag.  Will add MVI, trace elements and Lipids on Monday-Wednesday-Friday only due to a Sport and exercise psychologist of these items.  TPN labs every Monday and Thursday.   Verlene Mayer, PharmD, BCPS Pager [phone] 08/04/2011,2:02 PM

## 2011-08-05 LAB — GLUCOSE, CAPILLARY
Glucose-Capillary: 134 mg/dL — ABNORMAL HIGH (ref 70–99)
Glucose-Capillary: 123 mg/dL — ABNORMAL HIGH (ref 70–99)
Glucose-Capillary: 128 mg/dL — ABNORMAL HIGH (ref 70–99)

## 2011-08-05 LAB — BASIC METABOLIC PANEL
BUN: 12 mg/dL (ref 6–23)
CO2: 24 mEq/L (ref 19–32)
GFR calc non Af Amer: >90 mL/min (ref >90)
Glucose, Bld: 128 mg/dL — ABNORMAL HIGH (ref 70–99)
Potassium: 3.5 mEq/L (ref 3.5–5.1)

## 2011-08-05 MED ORDER — SPIRONOLACTONE 100 MG PO TABS
100.0000 mg | ORAL_TABLET | Freq: Every day | ORAL | Status: DC
  Administered 2011-08-05 – 2011-08-08 (×4): 100 mg via ORAL
  Filled 2011-08-05 (×5): qty 1

## 2011-08-05 MED ORDER — CLINIMIX E/DEXTROSE (5/15) 5 % IV SOLN
INTRAVENOUS | Status: AC
  Administered 2011-08-05: 18:00:00 via INTRAVENOUS
  Filled 2011-08-05: qty 2280

## 2011-08-05 MED ORDER — OCTREOTIDE ACETATE 100 MCG/ML IJ SOLN
200.0000 ug | Freq: Two times a day (BID) | INTRAMUSCULAR | Status: DC
  Administered 2011-08-05 – 2011-08-08 (×6): 200 ug via SUBCUTANEOUS
  Filled 2011-08-05 (×8): qty 2

## 2011-08-05 MED ORDER — FUROSEMIDE 40 MG PO TABS
40.0000 mg | ORAL_TABLET | Freq: Every day | ORAL | Status: DC
  Administered 2011-08-05 – 2011-08-08 (×4): 40 mg via ORAL
  Filled 2011-08-05 (×5): qty 1

## 2011-08-05 NOTE — Progress Notes (Signed)
PARENTERAL NUTRITION CONSULT NOTE - Follow up  Pharmacy Consult for TPN Indication: Pancreatic ascites, s/p ERCP with stent placement  No Known Allergies  Patient Measurements: Height: 5\' 8"  (172.7 cm) Weight: 167 lb 12.8 oz (76.114 kg) IBW/kg (Calculated) : 68.4  Usual Weight: 66  Vital Signs: Temp: 98.7 F (37.1 C) (02/26 0537) Temp src: Oral (02/26 0537) BP: 120/85 mmHg (02/26 0537) Pulse Rate: 98  (02/26 0537) Intake/Output from previous day: 02/25 0701 - 02/26 0700 In: 2847.6 [I.V.:547.6; IV Piggyback:50; TPN:2250] Out: 775 [Urine:775] Intake/Output from this shift:    Labs:  [facility] 08/04/11 0615  WBC 8.3  HGB 13.8  HCT 39.7  PLT 246  APTT --  INR --     Basename 08/05/11 0526 08/04/11 0615 08/03/11 0535  NA 135 136 139  K 3.5 3.9 3.7  CL 105 108 111  CO2 24 22 23   GLUCOSE 128* 146* 129*  BUN 12 10 6   CREATININE 0.55 0.58 0.60  LABCREA -- -- --  CREAT24HRUR -- -- --  CALCIUM 8.3* 8.2* 8.1*  MG -- 1.8 1.6  PHOS -- 3.0 2.9  PROT -- 4.9* 4.9*  ALBUMIN -- 1.2* 1.4*  AST -- 11 14  ALT -- <5 <5  ALKPHOS -- 60 62  BILITOT -- 0.3 0.4  BILIDIR -- -- --  IBILI -- -- --  PREALBUMIN -- 3.0* 4.0*  TRIG -- 40 58  CHOLHDL -- -- --  CHOL -- 69 --   Estimated Creatinine Clearance: 104.5 ml/min (by C-G formula based on Cr of 0.55).    Basename 08/05/11 0759 08/05/11 0413 08/05/11  GLUCAP 123* 134* 114*     Insulin Requirements in the past 24 hours:  6 units Novolog. 35 units Regular insulin per TNA  bag  Current Nutrition:  NPO- Clinimix E 5/15 infusing at 95 ml/hr and lipids 81ml/hr on MWF. This will provide average daily 1824 kcal per day and 114g protein .   Assessment: 51 year old man with a history of chronic pancreatitis due to ETOH s/p ERCP with stenting for pancreatic duct leak.  TPN started for nutrition support.  Lytes wnl, pre-albumin 3.0, low but d/t inflammatory process.  CBG wnl. Nutritional Goals:  2050-2350 kCal, 105-120 grams of  protein per day  Plan:  Continue Clinimix E 5/15 at 59mL/hr, which is goal for patient.  Will add MVI, trace elements and Lipids on Monday-Wednesday-Friday only due to a Sport and exercise psychologist of these items.  TPN labs every Monday and Thursday.   Verlene Mayer, PharmD, BCPS Pager [phone] 08/05/2011,10:43 AM

## 2011-08-05 NOTE — Progress Notes (Signed)
Patient ID: Melvin [name], male   DOB: 1961/03/17, 51 y.o.   MRN: [id number] Subjective:   Objective: Vital signs in last 24 hours: Temp:  [98.1 F (36.7 C)-98.7 F (37.1 C)] 98.7 F (37.1 C) (02/26 0537) Pulse Rate:  [92-99] 98  (02/26 0537) Resp:  [20] 20  (02/26 0537) BP: (120-128)/(84-90) 120/85 mmHg (02/26 0537) SpO2:  [98 %-99 %] 99 % (02/26 0537) Weight:  [76.114 kg (167 lb 12.8 oz)] 76.114 kg (167 lb 12.8 oz) (02/26 0537) Last BM Date: 08/04/11  Intake/Output from previous day: 02/25 0701 - 02/26 0700 In: 2847.6 [I.V.:547.6; IV Piggyback:50; TPN:2250] Out: 775 [Urine:775] Intake/Output this shift:    General appearance: alert and no distress GI: distended with ascites  Lab Results:  [facility] 08/04/11 0615  WBC 8.3  HGB 13.8  HCT 39.7  PLT 246   BMET  Basename 08/04/11 0615 08/03/11 0535  NA 136 139  K 3.9 3.7  CL 108 111  CO2 22 23  GLUCOSE 146* 129*  BUN 10 6  CREATININE 0.58 0.60  CALCIUM 8.2* 8.1*   LFT  Basename 08/04/11 0615  PROT 4.9*  ALBUMIN 1.2*  AST 11  ALT <5  ALKPHOS 60  BILITOT 0.3  BILIDIR --  IBILI --   PT/INR No results found for this basename: LABPROT:2,INR:2 in the last 72 hours Hepatitis Panel No results found for this basename: HEPBSAG,HCVAB,HEPAIGM,HEPBIGM in the last 72 hours C-Diff No results found for this basename: CDIFFTOX:3 in the last 72 hours Fecal Lactopherrin No results found for this basename: FECLLACTOFRN in the last 72 hours  Studies/Results: No results found.  Medications:  Scheduled:   . ampicillin-sulbactam (UNASYN) IV  1.5 g Intravenous Q8H  . antiseptic oral rinse  15 mL Mouth Rinse q12n4p  . chlorhexidine  15 mL Mouth Rinse BID  . enoxaparin  40 mg Subcutaneous Q24H  . furosemide  40 mg Oral Daily  . insulin aspart  0-15 Units Subcutaneous Q4H  . sodium chloride  10-40 mL Intracatheter Q12H  . spironolactone  100 mg Oral Daily  . thiamine  100 mg Oral Daily  . DISCONTD: furosemide  40  mg Oral BID   Continuous:   . sodium chloride 20 mL/hr at 08/04/11 1520  . fat emulsion 250 mL (08/04/11 1705)  . TPN (CLINIMIX) +/- additives 80 mL/hr at 08/04/11 1520  . TPN (CLINIMIX) +/- additives 95 mL/hr at 08/04/11 1705    Assessment/Plan: 1) Pancreatic ascites. 2) ETOH abuse.  Plan: 1) Continue with TPN. 2) Step I diuretics, i.e., Lasix 40 mg QD and Spironolactone 100 mg QD. 3) Continue NPO. 4) Monitor weight.  He has gained approximately 7 lbs since admission.  LOS: 8 days   Amey Hossain D 08/05/2011, 7:05 AM

## 2011-08-06 LAB — GLUCOSE, CAPILLARY
Glucose-Capillary: 120 mg/dL — ABNORMAL HIGH (ref 70–99)
Glucose-Capillary: 130 mg/dL — ABNORMAL HIGH (ref 70–99)
Glucose-Capillary: 159 mg/dL — ABNORMAL HIGH (ref 70–99)
Glucose-Capillary: 229 mg/dL — ABNORMAL HIGH (ref 70–99)
Glucose-Capillary: 85 mg/dL (ref 70–99)
Glucose-Capillary: 89 mg/dL (ref 70–99)
Glucose-Capillary: 99 mg/dL (ref 70–99)

## 2011-08-06 MED ORDER — TRACE MINERALS CR-CU-MN-SE-ZN 10-1000-500-60 MCG/ML IV SOLN
INTRAVENOUS | Status: AC
  Administered 2011-08-06: 17:00:00 via INTRAVENOUS
  Filled 2011-08-06: qty 2280

## 2011-08-06 MED ORDER — FAT EMULSION 20 % IV EMUL
250.0000 mL | INTRAVENOUS | Status: AC
  Administered 2011-08-06: 250 mL via INTRAVENOUS
  Filled 2011-08-06: qty 250

## 2011-08-06 NOTE — Progress Notes (Signed)
LATE ENTRY FOR 2/26: Pt seen at 2:50pm   Subjective:  Denies abd pain, states still with swelling in legs and fluid in abd building up Objective: Vital signs in last 24 hours: Temp:  [98.6 F (37 C)-99.1 F (37.3 C)] 98.6 F (37 C) (02/26 2145) Pulse Rate:  [85-98] 85  (02/26 2145) Resp:  [18-20] 18  (02/26 2145) BP: (120-135)/(80-86) 135/86 mmHg (02/26 2145) SpO2:  [94 %-99 %] 94 % (02/26 2145) Weight:  [76.114 kg (167 lb 12.8 oz)] 76.114 kg (167 lb 12.8 oz) (02/26 0537) Weight change:  Last BM Date: 08/05/11  Intake/Output from previous day: 02/26 0701 - 02/27 0700 In: 2270 [I.V.:590; TPN:1680] Out: 1550 [Urine:1550] Total I/O In: 2270 [I.V.:590; TPN:1680] Out: 675 [Urine:675]   Physical Exam: General: alert and oriented x3, in no acute distress. HEENT: No bruits, no goiter. Heart: Regular rate and rhythm, without murmurs, rubs, gallops. Lungs: Clear to auscultation bilaterally. Abdomen:  soft, nontender, flank fullness, +ascites, mild tenderness in mid abd, positive bowel sounds. Extremities: +1edema, no cyanosis with positive pedal pulses. Neuro: Grossly intact, nonfocal.    Lab Results: Basic Metabolic Panel:  Basename 08/05/11 0526 08/04/11 0615 08/03/11 0535  NA 135 136 --  K 3.5 3.9 --  CL 105 108 --  CO2 24 22 --  GLUCOSE 128* 146* --  BUN 12 10 --  CREATININE 0.55 0.58 --  CALCIUM 8.3* 8.2* --  MG -- 1.8 1.6  PHOS -- 3.0 2.9   Liver Function Tests:  Basename 08/04/11 0615 08/03/11 0535  AST 11 14  ALT <5 <5  ALKPHOS 60 62  BILITOT 0.3 0.4  PROT 4.9* 4.9*  ALBUMIN 1.2* 1.4*    Basename 08/04/11 0615  LIPASE 95*  AMYLASE --   CBC:  Basename 08/04/11 0615  WBC 8.3  NEUTROABS 6.2  HGB 13.8  HCT 39.7  MCV 90.4  PLT 246   CBG:  Basename 08/06/11 0010 08/05/11 1941 08/05/11 1547 08/05/11 1211 08/05/11 0759 08/05/11 0413  GLUCAP 85 168* 128* 109* 123* 134*   Hemoglobin A1C: No results found for this basename: HGBA1C in the last  72 hours Urinalysis: No results found for this basename: COLORURINE:2,APPERANCEUR:2,LABSPEC:2,PHURINE:2,GLUCOSEU:2,HGBUR:2,BILIRUBINUR:2,KETONESUR:2,PROTEINUR:2,UROBILINOGEN:2,NITRITE:2,LEUKOCYTESUR:2 in the last 72 hours  Recent Results (from the past 240 hour(s))  MRSA PCR SCREENING     Status: Abnormal   Collection Time   07/28/11  9:17 PM      Component Value Range Status Comment   MRSA by PCR POSITIVE (*) NEGATIVE  Final   BODY FLUID CULTURE     Status: Normal   Collection Time   08/01/11 11:41 AM      Component Value Range Status Comment   Specimen Description FLUID PERITONEAL   Final    Special Requests NONE   Final    Gram Stain     Final    Value: RARE WBC PRESENT,BOTH PMN AND MONONUCLEAR     NO ORGANISMS SEEN   Culture NO GROWTH 3 DAYS   Final    Report Status 08/04/2011 FINAL   Final   AFB CULTURE WITH SMEAR     Status: Normal (Preliminary result)   Collection Time   08/01/11 11:41 AM      Component Value Range Status Comment   Specimen Description FLUID PERITONEAL   Final    Special Requests NONE   Final    ACID FAST SMEAR NO ACID FAST BACILLI SEEN   Final    Culture     Final  Value: CULTURE WILL BE EXAMINED FOR 6 WEEKS BEFORE ISSUING A FINAL REPORT   Report Status PENDING   Incomplete     Studies/Results: No results found.  Medications: Scheduled Meds:    . ampicillin-sulbactam (UNASYN) IV  1.5 g Intravenous Q8H  . antiseptic oral rinse  15 mL Mouth Rinse q12n4p  . chlorhexidine  15 mL Mouth Rinse BID  . enoxaparin  40 mg Subcutaneous Q24H  . furosemide  40 mg Oral Daily  . insulin aspart  0-15 Units Subcutaneous Q4H  . octreotide  200 mcg Subcutaneous Q12H  . sodium chloride  10-40 mL Intracatheter Q12H  . spironolactone  100 mg Oral Daily  . thiamine  100 mg Oral Daily  . DISCONTD: furosemide  40 mg Oral BID   Continuous Infusions:    . sodium chloride 20 mL/hr at 08/04/11 1520  . fat emulsion 250 mL (08/04/11 1705)  . TPN (CLINIMIX) +/-  additives 95 mL/hr at 08/05/11 1734  . TPN (CLINIMIX) +/- additives 95 mL/hr at 08/04/11 1705   PRN Meds:.bisacodyl, morphine, Omnipaque 350 mg/mL (50 mL) in 0.9% normal saline (50 mL), ondansetron (ZOFRAN) IV, ondansetron, sodium chloride  Assessment/Plan:  Principal Problem:  *Pancreatitis Active Problems:  Diabetes mellitus  ETOH abuse   #1 Acute on Chronic Pancreatitis: Peritoneal fluid studies consistent with pancreatic ascites and per GI pre-sinusoidal portal hypertension, his SAAG is less than 1.2. Cell counts not consistent with SBP - MRI findings not concerning for pancreatic mass, but rather look like severe inflammation from his pancreatitis. -s/p ERCP>pancreatic duct leak. -Abdominal pain now resolved, and lipase improving  -was counseled on importance of cessation of ETOH. -on octreotide per GI, follow -Appreciate Dr. Haywood Pao assistance  #2Pancreatic duct leak, with pancreatic ascites- in distal duct per ERCP, S/P stent placement, on TNA - stent placement to be reassessed in 3-4wks - s/p paracentesis 2/22 - on abx/unasyn since 2/23 per GI -Lasix changed to daily and spironolactone added per GI, follow.  #3Constipation: 2/2 narcotics, resolved on current bowel regimen  #4 DM: fair control.  #5 ETOH abuse: thiamine/folate. -was counselled to quit alcohol, sw following for resources to quit.  #6?Portosplenic thrombosis Per discussion with Dr Elnoria Howard  no need for anticoagulation as this caused by inflamm. process.  -GI to advise when okay to DC for outpatient followup from his standpoint.   LOS: 9 days   Melvin [name] C [facility] Pager: [phone] 08/06/2011, 1:04 AM

## 2011-08-06 NOTE — Progress Notes (Signed)
Patient ID: Melvin [name], male   DOB: 1961-02-07, 51 y.o.   MRN: [id number] Subjective: Feeling well.  No complaints.  Objective: Vital signs in last 24 hours: Temp:  [98.1 F (36.7 C)-99.1 F (37.3 C)] 98.1 F (36.7 C) (02/27 0614) Pulse Rate:  [85-91] 88  (02/27 0614) Resp:  [18] 18  (02/27 0614) BP: (126-153)/(80-103) 153/103 mmHg (02/27 0614) SpO2:  [94 %-96 %] 95 % (02/27 0614) Last BM Date: 08/05/11  Intake/Output from previous day: 02/26 0701 - 02/27 0700 In: 3198 [I.V.:785; WUJ:8119] Out: 2475 [Urine:2475] Intake/Output this shift:    General appearance: alert and no distress GI: distended with ascites, but it appears to be less  Lab Results:  [facility] 08/04/11 0615  WBC 8.3  HGB 13.8  HCT 39.7  PLT 246   BMET  Basename 08/05/11 0526 08/04/11 0615  NA 135 136  K 3.5 3.9  CL 105 108  CO2 24 22  GLUCOSE 128* 146*  BUN 12 10  CREATININE 0.55 0.58  CALCIUM 8.3* 8.2*   LFT  Basename 08/04/11 0615  PROT 4.9*  ALBUMIN 1.2*  AST 11  ALT <5  ALKPHOS 60  BILITOT 0.3  BILIDIR --  IBILI --   PT/INR No results found for this basename: LABPROT:2,INR:2 in the last 72 hours Hepatitis Panel No results found for this basename: HEPBSAG,HCVAB,HEPAIGM,HEPBIGM in the last 72 hours C-Diff No results found for this basename: CDIFFTOX:3 in the last 72 hours Fecal Lactopherrin No results found for this basename: FECLLACTOFRN in the last 72 hours  Studies/Results: No results found.  Medications:  Scheduled:   . ampicillin-sulbactam (UNASYN) IV  1.5 g Intravenous Q8H  . antiseptic oral rinse  15 mL Mouth Rinse q12n4p  . chlorhexidine  15 mL Mouth Rinse BID  . enoxaparin  40 mg Subcutaneous Q24H  . furosemide  40 mg Oral Daily  . insulin aspart  0-15 Units Subcutaneous Q4H  . octreotide  200 mcg Subcutaneous Q12H  . sodium chloride  10-40 mL Intracatheter Q12H  . spironolactone  100 mg Oral Daily  . thiamine  100 mg Oral Daily   Continuous:   .  sodium chloride 20 mL/hr at 08/06/11 0325  . fat emulsion 250 mL (08/04/11 1705)  . TPN (CLINIMIX) +/- additives 95 mL/hr at 08/05/11 1734  . TPN (CLINIMIX) +/- additives 95 mL/hr at 08/04/11 1705    Assessment/Plan:  1) Pancreatic ascites.  2) ETOH abuse.    His abdomen appears to be less distended with ascites.  Octreotide was started yesterday.  I am hoping that the combination of the stent, octreotide, diuretics, NPO, and TPN is going to allow the duct leak to seal.  Once a downward trend in his ascites/weight can be documented, he can be discharged home with TPN.  Plan:  1) Continue with TPN, diuretics, NPO, and octreotide. 2) Daily weights.  LOS: 9 days   [name] D 08/06/2011, 7:25 AM

## 2011-08-06 NOTE — Progress Notes (Signed)
Patient discussed at the Long Length of Stay Maleki Hippe Weeks 08/06/2011  

## 2011-08-06 NOTE — Progress Notes (Signed)
Subjective: Patient seen and examined, likely on TPN. Good diuresis with Lasix  Objective: Vital signs in last 24 hours: Temp:  [98 F (36.7 C)-98.6 F (37 C)] 98 F (36.7 C) (02/27 1448) Pulse Rate:  [78-88] 78  (02/27 1448) Resp:  [16-18] 16  (02/27 1448) BP: (135-159)/(86-103) 159/98 mmHg (02/27 1448) SpO2:  [94 %-99 %] 99 % (02/27 1448) Weight:  [73.619 kg (162 lb 4.8 oz)] 73.619 kg (162 lb 4.8 oz) (02/27 1128) Weight change:  Last BM Date: 08/05/11  Intake/Output from previous day: 02/26 0701 - 02/27 0700 In: 3198 [I.V.:785; ZOX:0960] Out: 2475 [Urine:2475] Total I/O In: 1269.8 [I.V.:168.7; IV Piggyback:300; TPN:801.2] Out: 3850 [Urine:3850]   Physical Exam: HEENT: Atraumatic, normocephalic Neck: Supple Chest : Clear to auscultation bilaterally, no wheezing, no crackles Heart : S1 S2 Regular, no murmurs Abdomen: Soft, Nontender, no organomegaly, distended Ext : No cyanosis, clubbing, edema   Lab Results: Basic Metabolic Panel:  Basename 08/05/11 0526 08/04/11 0615  NA 135 136  K 3.5 3.9  CL 105 108  CO2 24 22  GLUCOSE 128* 146*  BUN 12 10  CREATININE 0.55 0.58  CALCIUM 8.3* 8.2*  MG -- 1.8  PHOS -- 3.0   Liver Function Tests:  Basename 08/04/11 0615  AST 11  ALT <5  ALKPHOS 60  BILITOT 0.3  PROT 4.9*  ALBUMIN 1.2*    Basename 08/04/11 0615  LIPASE 95*  AMYLASE --   No results found for this basename: AMMONIA:2 in the last 72 hours CBC:  Basename 08/04/11 0615  WBC 8.3  NEUTROABS 6.2  HGB 13.8  HCT 39.7  MCV 90.4  PLT 246   Cardiac Enzymes: No results found for this basename: CKTOTAL:3,CKMB:3,CKMBINDEX:3,TROPONINI:3 in the last 72 hours BNP: No results found for this basename: PROBNP:3 in the last 72 hours D-Dimer: No results found for this basename: DDIMER:2 in the last 72 hours CBG:  Basename 08/06/11 1550 08/06/11 1153 08/06/11 0758 08/06/11 0412 08/06/11 0010 08/05/11 1941  GLUCAP 130* 120* 99 89 85 168*   Hemoglobin  A1C: No results found for this basename: HGBA1C in the last 72 hours Fasting Lipid Panel:  Basename 08/04/11 0615  CHOL 69  HDL --  LDLCALC --  TRIG 40  CHOLHDL --  LDLDIRECT --   Thyroid Function Tests: No results found for this basename: TSH,T4TOTAL,FREET4,T3FREE,THYROIDAB in the last 72 hours Anemia Panel: No results found for this basename: VITAMINB12,FOLATE,FERRITIN,TIBC,IRON,RETICCTPCT in the last 72 hours Coagulation: No results found for this basename: LABPROT:2,INR:2 in the last 72 hours Urine Drug Screen: Drugs of Abuse  No results found for this basename: labopia, cocainscrnur, labbenz, amphetmu, thcu, labbarb    Alcohol Level: No results found for this basename: ETH:2 in the last 72 hours Urinalysis: No results found for this basename: COLORURINE:2,APPERANCEUR:2,LABSPEC:2,PHURINE:2,GLUCOSEU:2,HGBUR:2,BILIRUBINUR:2,KETONESUR:2,PROTEINUR:2,UROBILINOGEN:2,NITRITE:2,LEUKOCYTESUR:2 in the last 72 hours Misc. Labs:  Recent Results (from the past 240 hour(s))  MRSA PCR SCREENING     Status: Abnormal   Collection Time   07/28/11  9:17 PM      Component Value Range Status Comment   MRSA by PCR POSITIVE (*) NEGATIVE  Final   BODY FLUID CULTURE     Status: Normal   Collection Time   08/01/11 11:41 AM      Component Value Range Status Comment   Specimen Description FLUID PERITONEAL   Final    Special Requests NONE   Final    Gram Stain     Final    Value: RARE WBC PRESENT,BOTH PMN AND  MONONUCLEAR     NO ORGANISMS SEEN   Culture NO GROWTH 3 DAYS   Final    Report Status 08/04/2011 FINAL   Final   AFB CULTURE WITH SMEAR     Status: Normal (Preliminary result)   Collection Time   08/01/11 11:41 AM      Component Value Range Status Comment   Specimen Description FLUID PERITONEAL   Final    Special Requests NONE   Final    ACID FAST SMEAR NO ACID FAST BACILLI SEEN   Final    Culture     Final    Value: CULTURE WILL BE EXAMINED FOR 6 WEEKS BEFORE ISSUING A FINAL REPORT    Report Status PENDING   Incomplete     Studies/Results: No results found.  Medications: Scheduled Meds:   . ampicillin-sulbactam (UNASYN) IV  1.5 g Intravenous Q8H  . antiseptic oral rinse  15 mL Mouth Rinse q12n4p  . chlorhexidine  15 mL Mouth Rinse BID  . enoxaparin  40 mg Subcutaneous Q24H  . furosemide  40 mg Oral Daily  . insulin aspart  0-15 Units Subcutaneous Q4H  . octreotide  200 mcg Subcutaneous Q12H  . sodium chloride  10-40 mL Intracatheter Q12H  . spironolactone  100 mg Oral Daily  . thiamine  100 mg Oral Daily   Continuous Infusions:   . sodium chloride 20 mL/hr at 08/06/11 0325  . fat emulsion 250 mL (08/06/11 1726)  . TPN (CLINIMIX) +/- additives 95 mL/hr at 08/05/11 1734  . TPN (CLINIMIX) +/- additives 95 mL/hr at 08/06/11 1726   PRN Meds:.bisacodyl, morphine, Omnipaque 350 mg/mL (50 mL) in 0.9% normal saline (50 mL), ondansetron (ZOFRAN) IV, ondansetron, sodium chloride  Assessment/Plan:   Acute on chronic *Pancreatitis Patient has pancreatic ascites, MRI findings not concerning for pancreatic mass but look like severe inflammation from his pancreatitis.  Pancreatic duct leak Patient is currently on octreotide, TPN, diuretics.  GI is following Hopeful of duct leak sealed Continue Unasyn  Alcohol abuse Continue thiamine folate  Diabetes mellitus Continue sliding-scale insulin Blood glucose controlled  Porto splenic thrombosis Per Dr. Elnoria Howard no need for anticoagulation as this is caused by inflammatory process     LOS: 9 days   [facility] S [facility] Pager: [phone] 08/06/2011, 6:40 PM

## 2011-08-06 NOTE — Progress Notes (Signed)
PARENTERAL NUTRITION CONSULT NOTE - FOLLOW UP  Pharmacy Consult for TPN Indication: Pancreatic ascites, s/p ERCP with stent placement  No Known Allergies  Patient Measurements: Height: 5\' 8"  (172.7 cm) Weight: 167 lb 12.8 oz (76.114 kg) IBW/kg (Calculated) : 68.4  Usual Weight: 66kg  Vital Signs: Temp: 98.1 F (36.7 C) (02/27 0614) Temp src: Oral (02/27 0614) BP: 153/103 mmHg (02/27 0614) Pulse Rate: 88  (02/27 0614) Intake/Output from previous day: 02/26 0701 - 02/27 0700 In: 3198 [I.V.:785; ZOX:0960] Out: 2475 [Urine:2475] Intake/Output from this shift: Total I/O In: 0  Out: 700 [Urine:700]  Labs:  [facility] 08/04/11 0615  WBC 8.3  HGB 13.8  HCT 39.7  PLT 246  APTT --  INR --     Basename 08/05/11 0526 08/04/11 0615  NA 135 136  K 3.5 3.9  CL 105 108  CO2 24 22  GLUCOSE 128* 146*  BUN 12 10  CREATININE 0.55 0.58  LABCREA -- --  CREAT24HRUR -- --  CALCIUM 8.3* 8.2*  MG -- 1.8  PHOS -- 3.0  PROT -- 4.9*  ALBUMIN -- 1.2*  AST -- 11  ALT -- <5  ALKPHOS -- 60  BILITOT -- 0.3  BILIDIR -- --  IBILI -- --  PREALBUMIN -- 3.0*  TRIG -- 40  CHOLHDL -- --  CHOL -- 69   Estimated Creatinine Clearance: 104.5 ml/min (by C-G formula based on Cr of 0.55).    Basename 08/06/11 0758 08/06/11 0412 08/06/11 0010  GLUCAP 99 89 85   Insulin Requirements in the past 24 hours:  3 units of sliding scale insulin + 35 units of insulin in the TPN  Current Nutrition:  Remains NPO on Clinimix 5/15 at 26ml/hr and lipids at 35ml/hr on MWF only due to national shortage. This provides a daily average of 1824 kcal and 114gm protein.   Assessment: 65 yom with a history of chronic pancreatitis due to EtOH. Now s/p ERCP with stenting for pancreatic duct leak. No labs completed today. Octreotide started yesterday and has been known to be associated with hypoglycemia. CBGs this AM have been in the 80-90's which is at the lower range of goal.  Nutritional Goals:  2050-2350  kCal, 105-120 grams of protein per day  Plan:  1. Continue Clinimix E5/15 at goal rate of 5ml/hr. Lipids at 54ml/hr, MVI and trace elements on MWF only due national shortage.  2. Decrease insulin in TPN to 25units/hr due lower CBGs and risk of hypoglycemia with octreotide 3. F/u AM labs and CBGs  Gabriell Casimir, Drake Leach 08/06/2011,9:18 AM

## 2011-08-06 NOTE — Progress Notes (Signed)
   CARE MANAGEMENT NOTE 08/06/2011  Patient:  Melvin [name], Melvin [name]   Account Number:  [id number]  Date Initiated:  08/05/2011  Documentation initiated by:  MAYO,HENRIETTA  Subjective/Objective Assessment:   51 yr-old male with h/o ETOH abuse adm with pancreatitis     Action/Plan:   Anticipated DC Date:     Anticipated DC Plan:  HOME W HOME HEALTH SERVICES  In-house referral  Clinical Social Worker      DC Planning Services  CM consult      Choice offered to / List presented to:             Status of service:   Medicare Important Message given?   (If response is "NO", the following Medicare IM given date fields will be blank) Date Medicare IM given:   Date Additional Medicare IM given:    Discharge Disposition:    Per UR Regulation:    Comments:  PCP:  Dr. Mirna Mires 2/27 spoke w pt and gave hims hhc agency list, pt not sure of dc needs. gi md notes states may need home tna. explained role of case Production designer, theatre/television/film. lives w 51yo da. will await orders. Junius Creamer rn,bsn [phone] 08/04/11 1010 Henrietta Mayo RN MSN CCM S/P paracentesis with stent placement 2/2 duct leak. HOME HEALTH AGENCIES SERVING [place]   Agencies that are Medicare-Certified and are affiliated with [facility]  Telephone Number Address  [facility]   The [facility] has ownership interest in this company; however, you are under no obligation to use this agency. [phone] or  [phone] [address] [place], [place] [address]   Agencies that are Medicare-Certified and are not affiliated with [facility] Telephone Number Address  [facility] [phone] Fax [phone] [address], [address] [place], [place]  [address]  [facility] [phone] or [phone] Fax [phone] [address] [place], [place] [address]  [facility] [phone] Fax [phone] [address] [place], [place] [address]  [facility] [phone] Fax [phone] [address] [place], [place]  [address]  [facility] [phone] Fax [phone] [address], [address] [place], [place] [address]  [facility] [phone] [address] [place], [place] [address]  [facility] [phone]  [address] [place], [place] [address]  [facility] [phone] or [phone] Fax [phone] [address] [place], [place]  [address]  [facility] [phone] Fax [phone] [address] [place], [place]  [address]  [facility]  [phone] Fax [phone] [address] [place], [place] [address]

## 2011-08-07 LAB — COMPREHENSIVE METABOLIC PANEL
AST: 12 U/L (ref 0–37)
Albumin: 1.5 g/dL — ABNORMAL LOW (ref 3.5–5.2)
BUN: 10 mg/dL (ref 6–23)
Calcium: 8.7 mg/dL (ref 8.4–10.5)
Chloride: 95 mEq/L — ABNORMAL LOW (ref 96–112)
Creatinine, Ser: 0.43 mg/dL — ABNORMAL LOW (ref 0.50–1.35)
Total Bilirubin: 0.3 mg/dL (ref 0.3–1.2)

## 2011-08-07 LAB — GLUCOSE, CAPILLARY
Glucose-Capillary: 298 mg/dL — ABNORMAL HIGH (ref 70–99)
Glucose-Capillary: 161 mg/dL — ABNORMAL HIGH (ref 70–99)

## 2011-08-07 LAB — MAGNESIUM: Magnesium: 1.6 mg/dL (ref 1.5–2.5)

## 2011-08-07 LAB — PHOSPHORUS: Phosphorus: 3 mg/dL (ref 2.3–4.6)

## 2011-08-07 MED ORDER — INSULIN REGULAR HUMAN 100 UNIT/ML IJ SOLN
INTRAVENOUS | Status: DC
  Administered 2011-08-07: 17:00:00 via INTRAVENOUS
  Filled 2011-08-07: qty 2280

## 2011-08-07 MED ORDER — POTASSIUM CHLORIDE 10 MEQ/50ML IV SOLN
10.0000 meq | INTRAVENOUS | Status: AC
  Administered 2011-08-07 (×3): 10 meq via INTRAVENOUS
  Filled 2011-08-07 (×3): qty 50

## 2011-08-07 MED ORDER — HYDRALAZINE HCL 20 MG/ML IJ SOLN
10.0000 mg | Freq: Four times a day (QID) | INTRAMUSCULAR | Status: DC | PRN
  Administered 2011-08-07: 10 mg via INTRAVENOUS
  Filled 2011-08-07: qty 0.5

## 2011-08-07 MED ORDER — HYDROMORPHONE HCL PF 1 MG/ML IJ SOLN
1.0000 mg | INTRAMUSCULAR | Status: DC | PRN
  Administered 2011-08-07 (×5): 1 mg via INTRAVENOUS
  Filled 2011-08-07 (×5): qty 1

## 2011-08-07 MED ORDER — MAGNESIUM SULFATE 40 MG/ML IJ SOLN
2.0000 g | Freq: Once | INTRAMUSCULAR | Status: AC
  Administered 2011-08-07: 2 g via INTRAVENOUS
  Filled 2011-08-07: qty 50

## 2011-08-07 NOTE — Progress Notes (Signed)
Patient ID: Melvin [name], male   DOB: May 15, 1961, 51 y.o.   MRN: [id number] Subjective: No acute events, but he complained of nausea last evening.  Objective: Vital signs in last 24 hours: Temp:  [97.6 F (36.4 C)-98 F (36.7 C)] 98 F (36.7 C) (02/28 0622) Pulse Rate:  [78-117] 106  (02/28 0622) Resp:  [16-20] 20  (02/28 0622) BP: (159-168)/(98-109) 168/109 mmHg (02/28 0622) SpO2:  [97 %-100 %] 97 % (02/28 0622) Weight:  [73.6 kg (162 lb 4.1 oz)-73.619 kg (162 lb 4.8 oz)] 73.6 kg (162 lb 4.1 oz) (02/28 0622) Last BM Date: 08/05/11  Intake/Output from previous day: 02/27 0701 - 02/28 0700 In: 2983.8 [I.V.:510.7; IV Piggyback:300; TPN:2173.2] Out: 5350 [Urine:5350] Intake/Output this shift:    General appearance: alert and no distress GI: distended with ascites, soft  Lab Results: No results found for this basename: WBC:3,HGB:3,HCT:3,PLT:3 in the last 72 hours BMET  [facility] 08/05/11 0526  NA 135  K 3.5  CL 105  CO2 24  GLUCOSE 128*  BUN 12  CREATININE 0.55  CALCIUM 8.3*   LFT No results found for this basename: PROT,ALBUMIN,AST,ALT,ALKPHOS,BILITOT,BILIDIR,IBILI in the last 72 hours PT/INR No results found for this basename: LABPROT:2,INR:2 in the last 72 hours Hepatitis Panel No results found for this basename: HEPBSAG,HCVAB,HEPAIGM,HEPBIGM in the last 72 hours C-Diff No results found for this basename: CDIFFTOX:3 in the last 72 hours Fecal Lactopherrin No results found for this basename: FECLLACTOFRN in the last 72 hours  Studies/Results: No results found.  Medications:  Scheduled:   . ampicillin-sulbactam (UNASYN) IV  1.5 g Intravenous Q8H  . antiseptic oral rinse  15 mL Mouth Rinse q12n4p  . chlorhexidine  15 mL Mouth Rinse BID  . enoxaparin  40 mg Subcutaneous Q24H  . furosemide  40 mg Oral Daily  . insulin aspart  0-15 Units Subcutaneous Q4H  . octreotide  200 mcg Subcutaneous Q12H  . sodium chloride  10-40 mL Intracatheter Q12H  .  spironolactone  100 mg Oral Daily  . thiamine  100 mg Oral Daily   Continuous:   . sodium chloride 20 mL/hr at 08/06/11 0325  . fat emulsion 250 mL (08/06/11 1726)  . TPN (CLINIMIX) +/- additives 95 mL/hr at 08/05/11 1734  . TPN (CLINIMIX) +/- additives 95 mL/hr at 08/06/11 1726    Assessment/Plan: 1) Pancreatic ascites secondary to PD duct leak from ETOH abus. 2) ETOH abuse.   No change in his weight over the past 24 hours, but there was a change over the past 48 hours.  Plan: 1) Continue with TPN, NPO, Octreotide, and diuretics.  LOS: 10 days   Verlaine Embry D 08/07/2011, 7:30 AM

## 2011-08-07 NOTE — Progress Notes (Signed)
Subjective: Patient seen and examined, likely on TPN. Good diuresis with Lasix.BP is elevated.  Objective: Vital signs in last 24 hours: Temp:  [97.6 F (36.4 C)-98 F (36.7 C)] 97.8 F (36.6 C) (02/28 1418) Pulse Rate:  [92-117] 92  (02/28 1418) Resp:  [16-20] 16  (02/28 1418) BP: (168-177)/(109-116) 177/116 mmHg (02/28 1418) SpO2:  [97 %-100 %] 98 % (02/28 1418) Weight:  [73.6 kg (162 lb 4.1 oz)] 73.6 kg (162 lb 4.1 oz) (02/28 0622) Weight change:  Last BM Date: 08/05/11  Intake/Output from previous day: 02/27 0701 - 02/28 0700 In: 2983.8 [I.V.:510.7; IV Piggyback:300; TPN:2173.2] Out: 5350 [Urine:5350] Total I/O In: 1300 [I.V.:200; IV Piggyback:50; TPN:1050] Out: 850 [Urine:850]   Physical Exam: HEENT: Atraumatic, normocephalic Neck: Supple Chest : Clear to auscultation bilaterally, no wheezing, no crackles Heart : S1 S2 Regular, no murmurs Abdomen: Soft, Nontender, no organomegaly, distended Ext : No cyanosis, clubbing, edema   Lab Results: Basic Metabolic Panel:  Basename 08/07/11 0548 08/05/11 0526  NA 135 135  K 3.3* 3.5  CL 95* 105  CO2 32 24  GLUCOSE 212* 128*  BUN 10 12  CREATININE 0.43* 0.55  CALCIUM 8.7 8.3*  MG 1.6 --  PHOS 3.0 --   Liver Function Tests:  Basename 08/07/11 0548  AST 12  ALT 7  ALKPHOS 72  BILITOT 0.3  PROT 6.4  ALBUMIN 1.5*   CBG:  Basename 08/07/11 1653 08/07/11 1205 08/07/11 0759 08/07/11 0401 08/06/11 2331 08/06/11 2004  GLUCAP 207* 161* 141* 298* 229* 159*   Urine Drug Screen: Drugs of Abuse  No results found for this basename: labopia,  cocainscrnur,  labbenz,  amphetmu,  thcu,  labbarb    Alcohol Level: No results found for this basename: ETH:2 in the last 72 hours Urinalysis: No results found for this basename: COLORURINE:2,APPERANCEUR:2,LABSPEC:2,PHURINE:2,GLUCOSEU:2,HGBUR:2,BILIRUBINUR:2,KETONESUR:2,PROTEINUR:2,UROBILINOGEN:2,NITRITE:2,LEUKOCYTESUR:2 in the last 72 hours Misc. Labs:  Recent Results (from  the past 240 hour(s))  MRSA PCR SCREENING     Status: Abnormal   Collection Time   07/28/11  9:17 PM      Component Value Range Status Comment   MRSA by PCR POSITIVE (*) NEGATIVE  Final   BODY FLUID CULTURE     Status: Normal   Collection Time   08/01/11 11:41 AM      Component Value Range Status Comment   Specimen Description FLUID PERITONEAL   Final    Special Requests NONE   Final    Gram Stain     Final    Value: RARE WBC PRESENT,BOTH PMN AND MONONUCLEAR     NO ORGANISMS SEEN   Culture NO GROWTH 3 DAYS   Final    Report Status 08/04/2011 FINAL   Final   AFB CULTURE WITH SMEAR     Status: Normal (Preliminary result)   Collection Time   08/01/11 11:41 AM      Component Value Range Status Comment   Specimen Description FLUID PERITONEAL   Final    Special Requests NONE   Final    ACID FAST SMEAR NO ACID FAST BACILLI SEEN   Final    Culture     Final    Value: CULTURE WILL BE EXAMINED FOR 6 WEEKS BEFORE ISSUING A FINAL REPORT   Report Status PENDING   Incomplete     Studies/Results: No results found.  Medications: Scheduled Meds:    . ampicillin-sulbactam (UNASYN) IV  1.5 g Intravenous Q8H  . antiseptic oral rinse  15 mL Mouth Rinse q12n4p  .  chlorhexidine  15 mL Mouth Rinse BID  . enoxaparin  40 mg Subcutaneous Q24H  . furosemide  40 mg Oral Daily  . insulin aspart  0-15 Units Subcutaneous Q4H  . magnesium sulfate 1 - 4 g bolus IVPB  2 g Intravenous Once  . octreotide  200 mcg Subcutaneous Q12H  . potassium chloride  10 mEq Intravenous Q1 Hr x 3  . sodium chloride  10-40 mL Intracatheter Q12H  . spironolactone  100 mg Oral Daily  . thiamine  100 mg Oral Daily   Continuous Infusions:    . sodium chloride 20 mL/hr at 08/06/11 0325  . fat emulsion 250 mL (08/06/11 1726)  . TPN (CLINIMIX) +/- additives 95 mL/hr at 08/07/11 1729  . TPN (CLINIMIX) +/- additives 95 mL/hr at 08/05/11 1734  . TPN (CLINIMIX) +/- additives 95 mL/hr at 08/06/11 1726   PRN Meds:.bisacodyl,  hydrALAZINE, HYDROmorphone (DILAUDID) injection, Omnipaque 350 mg/mL (50 mL) in 0.9% normal saline (50 mL), ondansetron (ZOFRAN) IV, ondansetron, sodium chloride, DISCONTD: morphine  Assessment/Plan:   Acute on chronic *Pancreatitis Patient has pancreatic ascites, MRI findings not concerning for pancreatic mass but look like severe inflammation from his pancreatitis. Diuresis with lasix.  Pancreatic duct leak Patient is currently on octreotide, TPN, diuretics.  GI is following Hopeful of duct leak sealed Continue Unasyn  Alcohol abuse Continue thiamine folate  Diabetes mellitus Continue sliding-scale insulin Blood glucose controlled  Porto splenic thrombosis Per Dr. Elnoria Howard no need for anticoagulation as this is caused by inflammatory process     LOS: 10 days   [facility] Pager: [phone] 08/07/2011, 5:29 PM

## 2011-08-07 NOTE — Progress Notes (Signed)
PARENTERAL NUTRITION CONSULT NOTE - FOLLOW UP  Pharmacy Consult for TPN Indication: Pancreatic ascites, s/p ERCP with stent placement  No Known Allergies  Patient Measurements: Height: 5\' 8"  (172.7 cm) Weight: 162 lb 4.1 oz (73.6 kg) IBW/kg (Calculated) : 68.4  Usual Weight: 66kg  Vital Signs: Temp: 98 F (36.7 C) (02/28 0622) Temp src: Oral (02/28 0622) BP: 168/109 mmHg (02/28 0622) Pulse Rate: 106  (02/28 0622) Intake/Output from previous day: 02/27 0701 - 02/28 0700 In: 2983.8 [I.V.:510.7; IV Piggyback:300; TPN:2173.2] Out: 5350 [Urine:5350] Intake/Output from this shift:    Labs: No results found for this basename: WBC:3,HGB:3,HCT:3,PLT:3,APTT:3,INR:3 in the last 72 hours   Basename 08/07/11 0548 08/05/11 0526  NA 135 135  K 3.3* 3.5  CL 95* 105  CO2 32 24  GLUCOSE 212* 128*  BUN 10 12  CREATININE 0.43* 0.55  LABCREA -- --  CREAT24HRUR -- --  CALCIUM 8.7 8.3*  MG 1.6 --  PHOS 3.0 --  PROT 6.4 --  ALBUMIN 1.5* --  AST 12 --  ALT 7 --  ALKPHOS 72 --  BILITOT 0.3 --  BILIDIR -- --  IBILI -- --  PREALBUMIN -- --  TRIG -- --  CHOLHDL -- --  CHOL -- --   Estimated Creatinine Clearance: 104.5 ml/min (by C-G formula based on Cr of 0.43).    Basename 08/07/11 0759 08/07/11 0401 08/06/11 2331  GLUCAP 141* 298* 229*   Insulin Requirements in the past 24 hours:  16 units of sliding scale insulin + 25 units of insulin in the TPN  Current Nutrition:  Remains NPO on Clinimix 5/15 at 59ml/hr and lipids at 33ml/hr on MWF only due to national shortage. This provides a daily average of 1824 kcal and 114gm protein.   Assessment: 58 yom with a history of chronic pancreatitis due to EtOH. Now s/p ERCP with stenting for pancreatic duct leak. Octreotide started and has been known to be associated with hypoglycemia. CBGs have been 141-298.K 3.3, mag 1.6  Nutritional Goals:  2050-2350 kCal, 105-120 grams of protein per day  Plan:  1. Continue Clinimix E5/15 at  goal rate of 51ml/hr. Lipids at 51ml/hr, MVI and trace elements on MWF only due national shortage.  2. No change in insulin in TPN due to risk of hypoglycemia with octreotide.  Will monitor closely. 3. Magnesium 2 gm bolus and KCL 10 Meq IV x 3. 4.  F/u am labs.  Talbert Cage Poteet 08/07/2011,8:44 AM

## 2011-08-08 LAB — BASIC METABOLIC PANEL
Calcium: 8.7 mg/dL (ref 8.4–10.5)
GFR calc Af Amer: >90 mL/min (ref >90)
GFR calc non Af Amer: >90 mL/min (ref >90)
Glucose, Bld: 215 mg/dL — ABNORMAL HIGH (ref 70–99)
Potassium: 5.4 mEq/L — ABNORMAL HIGH (ref 3.5–5.1)
Sodium: 130 mEq/L — ABNORMAL LOW (ref 135–145)

## 2011-08-08 LAB — GLUCOSE, CAPILLARY
Glucose-Capillary: 219 mg/dL — ABNORMAL HIGH (ref 70–99)
Glucose-Capillary: 211 mg/dL — ABNORMAL HIGH (ref 70–99)
Glucose-Capillary: 265 mg/dL — ABNORMAL HIGH (ref 70–99)

## 2011-08-08 MED ORDER — OCTREOTIDE ACETATE 100 MCG/ML IJ SOLN: 200.0000 ug | Freq: Two times a day (BID) | INTRAMUSCULAR | Status: DC

## 2011-08-08 MED ORDER — INSULIN ASPART 100 UNIT/ML ~~LOC~~ SOLN: 0.0000 [IU] | SUBCUTANEOUS | Status: DC

## 2011-08-08 MED ORDER — SPIRONOLACTONE 100 MG PO TABS: 100.0000 mg | ORAL_TABLET | Freq: Every day | ORAL | Status: DC

## 2011-08-08 MED ORDER — BISACODYL 5 MG PO TBEC: 10.0000 mg | DELAYED_RELEASE_TABLET | Freq: Every day | ORAL | Status: AC | PRN

## 2011-08-08 MED ORDER — HYDRALAZINE HCL 20 MG/ML IJ SOLN: 10.0000 mg | Freq: Four times a day (QID) | INTRAMUSCULAR | Status: DC | PRN

## 2011-08-08 MED ORDER — TRACE MINERALS CR-CU-MN-SE-ZN 10-1000-500-60 MCG/ML IV SOLN
INTRAVENOUS | Status: DC
  Filled 2011-08-08: qty 2000

## 2011-08-08 MED ORDER — INSULIN GLULISINE 100 UNIT/ML ~~LOC~~ SOLN: 2.0000 [IU] | Freq: Three times a day (TID) | SUBCUTANEOUS | Status: DC

## 2011-08-08 MED ORDER — FAT EMULSION 20 % IV EMUL
250.0000 mL | INTRAVENOUS | Status: DC
  Filled 2011-08-08: qty 250

## 2011-08-08 MED ORDER — HYDRALAZINE HCL 10 MG PO TABS: 25.0000 mg | ORAL_TABLET | Freq: Two times a day (BID) | ORAL | Status: DC

## 2011-08-08 MED ORDER — FUROSEMIDE 40 MG PO TABS: 40.0000 mg | ORAL_TABLET | Freq: Every day | ORAL | Status: DC

## 2011-08-08 NOTE — Progress Notes (Signed)
Patient ID: Melvin [name], male   DOB: 07/01/1960, 51 y.o.   MRN: [id number] Subjective: No acute events.  He feels as if his ascites is decreasing.  Objective: Vital signs in last 24 hours: Temp:  [97.8 F (36.6 C)-98.9 F (37.2 C)] 98.6 F (37 C) (03/01 0555) Pulse Rate:  [92-105] 96  (03/01 0555) Resp:  [16-19] 16  (03/01 0555) BP: (112-177)/(77-116) 112/77 mmHg (03/01 0555) SpO2:  [95 %-99 %] 99 % (03/01 0555) Weight:  [73.5 kg (162 lb 0.6 oz)] 73.5 kg (162 lb 0.6 oz) (03/01 0555) Last BM Date: 08/05/11  Intake/Output from previous day: 02/28 0701 - 03/01 0700 In: 3043 [I.V.:486; IV Piggyback:50; TPN:2507] Out: 2025 [Urine:2025] Intake/Output this shift:    General appearance: alert and no distress GI: soft, distended with ascites, appears to be reducing  Lab Results: No results found for this basename: WBC:3,HGB:3,HCT:3,PLT:3 in the last 72 hours BMET  Basename 08/07/11 0548  NA 135  K 3.3*  CL 95*  CO2 32  GLUCOSE 212*  BUN 10  CREATININE 0.43*  CALCIUM 8.7   LFT  Basename 08/07/11 0548  PROT 6.4  ALBUMIN 1.5*  AST 12  ALT 7  ALKPHOS 72  BILITOT 0.3  BILIDIR --  IBILI --   PT/INR No results found for this basename: LABPROT:2,INR:2 in the last 72 hours Hepatitis Panel No results found for this basename: HEPBSAG,HCVAB,HEPAIGM,HEPBIGM in the last 72 hours C-Diff No results found for this basename: CDIFFTOX:3 in the last 72 hours Fecal Lactopherrin No results found for this basename: FECLLACTOFRN in the last 72 hours  Studies/Results: No results found.  Medications:  Scheduled:   . ampicillin-sulbactam (UNASYN) IV  1.5 g Intravenous Q8H  . antiseptic oral rinse  15 mL Mouth Rinse q12n4p  . chlorhexidine  15 mL Mouth Rinse BID  . enoxaparin  40 mg Subcutaneous Q24H  . furosemide  40 mg Oral Daily  . insulin aspart  0-15 Units Subcutaneous Q4H  . magnesium sulfate 1 - 4 g bolus IVPB  2 g Intravenous Once  . octreotide  200 mcg Subcutaneous  Q12H  . potassium chloride  10 mEq Intravenous Q1 Hr x 3  . sodium chloride  10-40 mL Intracatheter Q12H  . spironolactone  100 mg Oral Daily  . thiamine  100 mg Oral Daily   Continuous:   . sodium chloride 20 mL/hr at 08/07/11 1737  . fat emulsion 250 mL (08/06/11 1726)  . TPN (CLINIMIX) +/- additives 95 mL/hr at 08/07/11 1729  . TPN (CLINIMIX) +/- additives 95 mL/hr at 08/06/11 1726    Assessment/Plan: 1) Pancreatic ascites from a ductal leak secondary to ETOH abuse. 2) ETOH abuse.   The patient's current standing weight (standing on the scale) is at 62 kg.  The bed weights are inaccurate.  Clinically it appears that his ascites is decreasing.  Plan: 1) Continue with TPN, NPO, Octreotide, and diuretics. 2) Standing weight daily. 3) Need to arrange for home TPN.  LOS: 11 days   Nykole Matos D 08/08/2011, 8:01 AM    

## 2011-08-08 NOTE — Discharge Summary (Signed)
Melvin [name], 51 y.o., DOB 1960/11/29, MRN [id number]. Admission date: 07/28/2011 Discharge Date 08/08/2011 Primary MD Evlyn Courier, MD, MD Admitting Physician Chaya Jan, MD  Admission Diagnosis  Ascites [789.5] Pancreatitis [577.0] Pancreatitis  Pancreatic ascites  Discharge Diagnosis   Principal Problem:  *Pancreatitis Active Problems:  Diabetes mellitus  ETOH abuse      Past Medical History  Diagnosis Date  . Diabetes mellitus   . Meningitis   . Pneumonia   . GERD (gastroesophageal reflux disease)   . Pancreatitis   . Angina     Past Surgical History  Procedure Date  . Tonsillectomy and adenoidectomy     "as a kid"  . Ercp 08/02/2011    Procedure: ENDOSCOPIC RETROGRADE CHOLANGIOPANCREATOGRAPHY (ERCP);  Surgeon: Theda Belfast, MD;  Location: [facility] ENDOSCOPY;  Service: Endoscopy;  Laterality: N/A;    Brief History and physical y/o black man with h/o DM and ETOH abuse, comes frequently with episodes of acute on chronic pancreatitis. For the past week has been having abdominal pain in the epigastric area as well as increased abdominal girth. Went to see his PCP on Friday who drew labs and asked him to come to the ED today because the labs were abnormal. Found to have a lipase of over 800 and was not able to tolerate liquids in the ED, so we are asked to admit him for further evaluation and management. Of note, a CT scan done in 12/12 showed a possibility of a pancreatic mass and a ?splenic vein thrombosis for which it was recommended that he have an outpatient MRI, however he never followed up for that issue.   Hospital Course   Pancreatitis Patient was seen by GI, He was found to have pancreatic ascites,He underwent paracentesis. Peritoneal fluid studies consistent with pancreatic ascites and per GI pre-sinusoidal portal hypertension, his SAAG is less than 1.2. Cell counts not consistent with SBP  MRI findings not concerning for pancreatic mass, but rather  look like severe inflammation from his pancreatitis.  He underwent ERCP with stent placement by dr Elnoria Howard, after the manipulation of pancreatic duct he developed pancreatitis with pancreatic duct leak. He was kept NPO, TPN started on lasix for diuresis and octreotide. Patient's weight was monitored and at this time he has diuresed well. His abdominal pain has resolved. His lipase is back to normal. He will be discharged home on [facility]PN [facility]. He has high potassium, and will check BMP in two days by [facility].  Pancreatic duct leak Pancreatic duct leak, with pancreatic ascites- in distal duct per ERCP, S/P stent placement, on TNA  - stent placement to be reassessed in 3-4wks  - s/p paracentesis 2/22  - on abx/unasyn since 2/23 which has been discontinued. Total 7 days given in the hospital.  Alcohol abuse Counseled.  Hypertension: He has been started on Hydralazine 25 mg po BID.  Diabetes mellitus: IV Insulin to be adjusted by [facility].  Hyperkalemia Potassium is 5.4 today, pharmacy has been adjusting the potassium in TPN> [facility] can draw the BMP on 08/10/11 and adjust the potassium accordingly. Can call Dr Loleta Chance for results  Consults GI  Significant Tests:  See full reports for all details    Dg Abd 1 View  07/25/2011  *RADIOLOGY REPORT*  Clinical Data: Abdominal pain.  Swelling.  Diarrhea.  ABDOMEN - 1 VIEW  Comparison: Abdominal CT 05/28/2011.  Findings: Nonobstructive bowel gas pattern is present.  Moderate stool burden.  Levoconvex  lumbar scoliosis.  The soft tissues of the abdomen had an unusual pattern, likely related to pannus.  No intra-abdominal free air or organomegaly.  Stool and bowel gas extends to the rectosigmoid. There is some centralization of bowel loops, raising the possibility of ascites.  IMPRESSION: Nonobstructive bowel gas pattern. Centralization of bowel loops suggesting ascites.  Original Report Authenticated By: Andreas Newport, M.D.   [name]  07/29/2011  *RADIOLOGY REPORT*  Clinical Data: Abdominal pain.  Elevated lipase.  History pancreatitis.  MRI ABDOMEN WITH AND WITHOUT CONTRAST  Technique:  Multiplanar multisequence MR imaging of the abdomen was performed both before and after administration of intravenous contrast.  Contrast: 15mL MULTIHANCE GADOBENATE DIMEGLUMINE 529 MG/ML IV SOLN  Comparison: 05/28/2011  Findings: Marked ascites is noted, significantly worsened compared the prior exam.  Diffuse mesenteric edema noted, with the extensive portasystemic collateralization and surrounding fluid infiltration causing an unusual layered vermiform appearance along the mesenteric margins.  The presence of ascites allows for a greater than normal amount of motion artifact involving the bowel and abdominal structures.  Minimally prominent pancreatic duct noted with irregularity especially in the pancreatic tail.  There is heterogeneous enhancement in the pancreas, likely a manifestation of the patient's pancreatitis.  No focal mass is observed although given the heterogeneity of the pancreas, and infiltrative process is difficult to completely exclude.  Reduced signal intensity in the head of the pancreas is observed on pre and postcontrast images, with ill definition of pancreatic margins.  No discrete renal abnormality is observed.  Splenic enhancement appears normal.  Mild contour flattening of the liver is probably from tense ascites.  A 7 mm right hepatic lobe cyst as shown on image 34 of series 16109.  Subcutaneous edema is present diffusely.  IMPRESSION:  1.  Prominent increase in abdominal ascites which currently appears tense and extensive.  There is accompanying diffuse mesenteric edema, with portasystemic collaterals causing a layered vermiform appearance within and along the mesentery and omentum. 2.  Heterogeneous signal intensity and enhancement of the pancreas, with reduced signal intensity in the  pancreatic head and adjacent body compared to the pancreatic tail.  Although this could be from an infiltrative process, the lack of a visualized mass lesion raises my suspicion that this is probably a manifestation of the patient's acute on chronic pancreatitis.  I doubt that this appearance is due to pancreatic necrosis, but if the patient is fulminantly ill, then urgent surgical assessment may be warranted.  3.  Subcutaneous edema.  Original Report Authenticated By: Dellia Cloud, M.D.   US Paracentesis  08/01/2011  *RADIOLOGY REPORT*  Clinical Data: Abdominal ascites  ULTRASOUND GUIDED PARACENTESIS  Comparison:  None  An ultrasound guided paracentesis was thoroughly discussed with the patient and questions answered.  The benefits, risks, alternatives and complications were also discussed.  The patient understands and wishes to proceed with the procedure.  Written consent was obtained.  Ultrasound was performed to localize and mark an adequate pocket of fluid in the right lower quadrant of the abdomen.  The area was then prepped and draped in the normal sterile fashion.  1% Lidocaine was used for local anesthesia.  Under ultrasound guidance a 19 gauge Yueh catheter was introduced.  Paracentesis was performed.  The catheter was removed and a dressing applied.  Complications:  None  Findings:  A total of approximately 6.2 liters of blood-tinged fluid was removed.  A fluid sample was sent for laboratory analysis.  IMPRESSION: Successful ultrasound guided  paracentesis yielding 6.2 liters of ascites.  Read by: Ralene Muskrat, P.A.-C  Original Report Authenticated By: Waynard Reeds, M.D.   Dg Ercp With Sphincterotomy  08/02/2011  *RADIOLOGY REPORT*  Clinical Data: Acute superimposed upon chronic pancreatitis. Ascites.  ERCP 08/02/2011:  Comparison:  MRI abdomen 07/29/2011.  CT abdomen and pelvis 05/28/2011.  Technique: 2 spot images were obtained with the fluoroscopic device and submitted for  interpretation post-procedure.  ERCP was performed by Dr. Elnoria Howard.  Findings: Initial image demonstrates a guide wire within the pancreatic duct.  Contrast material is present within the gastric fundus and the duodenum.  Second image demonstrates a pancreatic duct stent.  Contrast material within the gastric fundus and the distal esophagus may indicate gastroesophageal reflux.  The radiologic technologist documented 2 minutes 27 seconds of fluoroscopy time.  IMPRESSION: Pancreatic duct stent placement by Dr. Elnoria Howard.  These images were submitted for radiologic interpretation only. Please see the procedural report for the amount of contrast and the fluoroscopy time utilized.  Original Report Authenticated By: Arnell Sieving, M.D.   Dg Abd 2 Views  07/28/2011  *RADIOLOGY REPORT*  Clinical Data: Abdominal distention.  Constipation.  Diabetes.  ABDOMEN - 2 VIEW  Comparison: 07/25/2011  Findings: No evidence of dilated bowel loops.  No evidence of free air.  Question left colonic wall thickening in the left abdomen, which may be seen with colitis, versus incomplete distention by bowel gas.  No evidence of radiopaque calculi.  IMPRESSION: Nonobstructive bowel gas pattern.  Question left colonic wall thickening versus incomplete distention.  Original Report Authenticated By: Danae Orleans, M.D.     Today   Subjective:   Melvin [name] today has no headache,no chest abdominal pain,no new weakness.  Objective:   Blood pressure 132/88, pulse 95, temperature 98.5 F (36.9 C), temperature source Oral, resp. rate 16, height 5\' 8"  (1.727 m), weight 73.5 kg (162 lb 0.6 oz), SpO2 98.00%.  Intake/Output Summary (Last 24 hours) at 08/08/11 1446 Last data filed at 08/08/11 0934  Gross per 24 hour  Intake   2993 ml  Output   1975 ml  Net   1018 ml    Exam Awake Alert, Oriented *3, No new F.N deficits, Normal affect [place].AT,PERRAL Supple Neck,No JVD, No cervical lymphadenopathy appriciated.  Symmetrical Chest  wall movement, Good air movement bilaterally, CTAB RRR,No Gallops,Rubs or new Murmurs, No Parasternal Heave +ve B.Sounds, Abd Soft, Non tender, No organomegaly appriciated, No rebound -guarding or rigidity. No Cyanosis, Clubbing or edema, No new Rash or bruise  Data Review   Cultures -   CBC w Diff: Lab Results  Component Value Date   WBC 8.3 08/04/2011   HGB 13.8 08/04/2011   HCT 39.7 08/04/2011   PLT 246 08/04/2011   LYMPHOPCT 7* 08/04/2011   MONOPCT 19* 08/04/2011   EOSPCT 0 08/04/2011   BASOPCT 0 08/04/2011   CMP: Lab Results  Component Value Date   NA 130* 08/08/2011   K 5.4* 08/08/2011   CL 93* 08/08/2011   CO2 33* 08/08/2011   BUN 15 08/08/2011   CREATININE 0.45* 08/08/2011   PROT 6.4 08/07/2011   ALBUMIN 1.5* 08/07/2011   BILITOT 0.3 08/07/2011   ALKPHOS 72 08/07/2011   AST 12 08/07/2011   ALT 7 08/07/2011  .  Micro Results Recent Results (from the past 240 hour(s))  BODY FLUID CULTURE     Status: Normal   Collection Time   08/01/11 11:41 AM      Component Value  Range Status Comment   Specimen Description FLUID PERITONEAL   Final    Special Requests NONE   Final    Gram Stain     Final    Value: RARE WBC PRESENT,BOTH PMN AND MONONUCLEAR     NO ORGANISMS SEEN   Culture NO GROWTH 3 DAYS   Final    Report Status 08/04/2011 FINAL   Final   AFB CULTURE WITH SMEAR     Status: Normal (Preliminary result)   Collection Time   08/01/11 11:41 AM      Component Value Range Status Comment   Specimen Description FLUID PERITONEAL   Final    Special Requests NONE   Final    ACID FAST SMEAR NO ACID FAST BACILLI SEEN   Final    Culture     Final    Value: CULTURE WILL BE EXAMINED FOR 6 WEEKS BEFORE ISSUING A FINAL REPORT   Report Status PENDING   Incomplete      Discharge Instructions      Follow-up Information    Follow up with HILL,GERALD K, MD .         Discharge Medications   Medication List  As of 08/08/2011  2:46 PM   START taking these medications         bisacodyl 5 MG EC  tablet   Commonly known as: DULCOLAX   Take 2 tablets (10 mg total) by mouth daily as needed.      furosemide 40 MG tablet   Commonly known as: LASIX   Take 1 tablet (40 mg total) by mouth daily.      hydrALAZINE 10 MG tablet   Commonly known as: APRESOLINE   Take 2.5 tablets (25 mg total) by mouth 2 (two) times daily.      insulin aspart 100 UNIT/ML injection   Commonly known as: novoLOG   Inject 0-15 Units into the skin every 4 (four) hours.      octreotide 100 MCG/ML Soln   Commonly known as: SANDOSTATIN   Inject 2 mLs (200 mcg total) into the skin every 12 (twelve) hours.      spironolactone 100 MG tablet   Commonly known as: ALDACTONE   Take 1 tablet (100 mg total) by mouth daily.         CONTINUE taking these medications         folic acid 1 MG tablet   Commonly known as: FOLVITE   Take 1 tablet (1 mg total) by mouth daily.      insulin glargine 100 UNIT/ML injection   Commonly known as: LANTUS      pantoprazole 40 MG tablet   Commonly known as: PROTONIX   Take 1 tablet (40 mg total) by mouth daily.      thiamine 100 MG tablet   Take 1 tablet (100 mg total) by mouth daily.         STOP taking these medications         glipiZIDE 10 MG tablet      insulin glulisine 100 UNIT/ML injection      metFORMIN 1000 MG tablet      metoCLOPramide 5 MG tablet      mulitivitamin with minerals Tabs          Where to get your medications    These are the prescriptions that you need to pick up. We sent them to a specific pharmacy, so you will need to go there to get them.  [facility] - [place], [place] - [address]    [address] [place] [address]    Phone: [phone]        insulin aspart 100 UNIT/ML injection         You may get these medications from any pharmacy.         bisacodyl 5 MG EC tablet   furosemide 40 MG tablet   hydrALAZINE 10 MG tablet   octreotide 100 MCG/ML Soln   spironolactone 100 MG tablet             Total  Time in preparing paper work, data evaluation and todays exam - 35 minutes  Carena Stream S M.D on 08/08/2011 at 2:46 PM  [facility] Office  [phone]

## 2011-08-08 NOTE — Progress Notes (Signed)
Patient discharger to home in care of daughter and with [facility]. PICC line capped off and flushed by IV team. Medications and instructions reviewed with patient with no questions. Assessment unchanged from this am.

## 2011-08-08 NOTE — Progress Notes (Signed)
Nutrition Follow-up  Diet Order:  NPO  TPN:  Clinimix E 5/15 at goal rate of 95 ml/hr.  20% Lipids at 10 ml/hr, MVI and trace elements MWF due to national shortage.  This provides a daily average of 1824 kcal and 114 gm protein.  Meds: Scheduled Meds:   . ampicillin-sulbactam (UNASYN) IV  1.5 g Intravenous Q8H  . antiseptic oral rinse  15 mL Mouth Rinse q12n4p  . chlorhexidine  15 mL Mouth Rinse BID  . enoxaparin  40 mg Subcutaneous Q24H  . furosemide  40 mg Oral Daily  . insulin aspart  0-15 Units Subcutaneous Q4H  . magnesium sulfate 1 - 4 g bolus IVPB  2 g Intravenous Once  . octreotide  200 mcg Subcutaneous Q12H  . potassium chloride  10 mEq Intravenous Q1 Hr x 3  . sodium chloride  10-40 mL Intracatheter Q12H  . spironolactone  100 mg Oral Daily  . thiamine  100 mg Oral Daily   Continuous Infusions:   . sodium chloride 20 mL/hr at 08/07/11 1737  . fat emulsion 250 mL (08/06/11 1726)  . TPN (CLINIMIX) +/- additives 95 mL/hr at 08/07/11 1729  . TPN (CLINIMIX) +/- additives 95 mL/hr at 08/06/11 1726   PRN Meds:.bisacodyl, hydrALAZINE, HYDROmorphone (DILAUDID) injection, Omnipaque 350 mg/mL (50 mL) in 0.9% normal saline (50 mL), ondansetron (ZOFRAN) IV, ondansetron, sodium chloride  Labs:  CMP     Component Value Date/Time   NA 130* 08/08/2011 1022   K 5.4* 08/08/2011 1022   CL 93* 08/08/2011 1022   CO2 33* 08/08/2011 1022   GLUCOSE 215* 08/08/2011 1022   BUN 15 08/08/2011 1022   CREATININE 0.45* 08/08/2011 1022   CALCIUM 8.7 08/08/2011 1022   PROT 6.4 08/07/2011 0548   ALBUMIN 1.5* 08/07/2011 0548   AST 12 08/07/2011 0548   ALT 7 08/07/2011 0548   ALKPHOS 72 08/07/2011 0548   BILITOT 0.3 08/07/2011 0548   GFRNONAA >90 08/08/2011 1022   GFRAA >90 08/08/2011 1022  k+ elevated, Na low   Intake/Output Summary (Last 24 hours) at 08/08/11 1126 Last data filed at 08/08/11 0934  Gross per 24 hour  Intake   3043 ml  Output   2425 ml  Net    618 ml    Weight Status:  62 kg (standing scale)  per MD note- bed scale not accurate.  Re-estimated needs: 2050-2350 kcals, 105-120 g protein   Nutrition Dx:  Inadequate oral intake- ongoing  Goal:  Meet >90% estimated needs with TPN- not met, meeting 89% estimated kcal needs and 100 % estimated protein needs.  Intervention:  Continue  TPN per pharmacy, Possible D/C to home today with home TPN  Monitor:  Plan or care, tpn, labs, weights   Jeoffrey Massed Pager #:  [phone]

## 2011-08-08 NOTE — Progress Notes (Signed)
CARE MANAGEMENT NOTE 08/08/2011  Patient:  Melvin [name], Melvin [name]   Account Number:  [id number]  Date Initiated:  08/05/2011  Documentation initiated by:  Alvira Philips Assessment:   51 yr-old male with h/o ETOH abuse adm with pancreatitis     Action/Plan:   Anticipated DC Date:  08/08/2011   Anticipated DC Plan:  HOME W HOME HEALTH SERVICES  In-house referral  Clinical Social Worker      DC Associate Professor  CM consult      PAC Choice  DURABLE MEDICAL EQUIPMENT  HOME HEALTH   Choice offered to / List presented to:  C-1 Patient   DME arranged  IV PUMP/EQUIPMENT      DME agency  [facility]     [facility] arranged  HH-1 RN      [facility] agency  [facility]   Status of service:   Medicare Important Message given?   (If response is "NO", the following Medicare IM given date fields will be blank) Date Medicare IM given:   Date Additional Medicare IM given:    Discharge Disposition:  HOME W HOME HEALTH SERVICES  Per UR Regulation:    Comments:  PCP:  Dr. Mirna Mires  3/1 spoke w pt, he went over hhc agency list and chose [facility] for hhc, ref to [facility] and also in [facility] for hometna, copy of hhc agency list on chart. debbie Ila Landowski rn,bsn 2/27 spoke w pt and gave hims hhc agency list, pt not sure of dc needs. gi md notes states may need home tna. explained role of case Production designer, theatre/television/film. lives w 51yo da. will await orders. Junius Creamer rn,bsn [phone] 08/04/11 1010 Henrietta Mayo RN MSN CCM S/P paracentesis with stent placement 2/2 duct leak. HOME HEALTH AGENCIES SERVING [place]   Agencies that are Medicare-Certified and are affiliated with [facility]  Telephone Number Address  [facility]   The [facility] has ownership interest in this company; however, you are under no obligation to use this agency. [phone] or  [phone] [address] [place], [place] [address]    Agencies that are Medicare-Certified and are not affiliated with [facility] Telephone Number Address  [facility] [phone] Fax [phone] [address] [place], [place]  [address]  [facility] [phone] or [phone] Fax [phone] [address] [place], [place] [address]  [facility] [phone] Fax [phone] [address] [place], [place] [address]  [facility] [phone] Fax [phone] [address] [place], [place]  [address]  [facility] [phone] Fax [phone] [address], [address] [place], [place] [address]  [facility] [phone] [address] [place], [place] [address]  [facility] [phone]  [address] [place], [place] [address]  [facility] [phone] or [phone] Fax [phone]  [address] [place], [place]  [address]  [facility] [phone] Fax [phone] [address] [place], [place]  [address]  [facility]  [phone] Fax [phone] [address] [place], [place] [address]

## 2011-08-08 NOTE — Progress Notes (Signed)
PARENTERAL NUTRITION CONSULT NOTE - FOLLOW UP  Pharmacy Consult for TPN Indication: Pancreatic ascites, s/p ERCP with stent placement  No Known Allergies  Patient Measurements: Height: 5\' 8"  (172.7 cm) Weight: 162 lb 0.6 oz (73.5 kg) IBW/kg (Calculated) : 68.4  Usual Weight: 66kg  Vital Signs: Temp: 98.8 F (37.1 C) (03/01 1013) Temp src: Oral (03/01 1013) BP: 118/78 mmHg (03/01 1013) Pulse Rate: 89  (03/01 1013) Intake/Output from previous day: 02/28 0701 - 03/01 0700 In: 3043 [I.V.:486; IV Piggyback:50; TPN:2507] Out: 2025 [Urine:2025] Intake/Output from this shift: Total I/O In: 0  Out: 400 [Urine:400]  Labs: No results found for this basename: WBC:3,HGB:3,HCT:3,PLT:3,APTT:3,INR:3 in the last 72 hours   Basename 08/08/11 1022 08/07/11 0548  NA 130* 135  K 5.4* 3.3*  CL 93* 95*  CO2 33* 32  GLUCOSE 215* 212*  BUN 15 10  CREATININE 0.45* 0.43*  LABCREA -- --  CREAT24HRUR -- --  CALCIUM 8.7 8.7  MG -- 1.6  PHOS -- 3.0  PROT -- 6.4  ALBUMIN -- 1.5*  AST -- 12  ALT -- 7  ALKPHOS -- 72  BILITOT -- 0.3  BILIDIR -- --  IBILI -- --  PREALBUMIN -- --  TRIG -- --  CHOLHDL -- --  CHOL -- --   Estimated Creatinine Clearance: 104.5 ml/min (by C-G formula based on Cr of 0.45).    Basename 08/08/11 1159 08/08/11 0759 08/08/11 0323  GLUCAP 265* 211* 219*   Insulin Requirements in the past 24 hours:  38 units of sliding scale insulin + 25 units of insulin in the TPN  Current Nutrition:  Remains NPO on Clinimix 5/15 at 68ml/hr and lipids at 78ml/hr on MWF only due to national shortage. This provides a daily average of 1824 kcal and 114gm protein.   Assessment: 46 yom with a history of chronic pancreatitis due to EtOH. Now s/p ERCP with stenting for pancreatic duct leak. Octreotide started and has been known to be associated with hypoglycemia. CBGs have been elevated 207-398.K 5.4 today  Nutritional Goals:  2050-2350 kCal, 105-120 grams of protein per  day  Plan:  1. Change Clinimix 5/15 to no electrolytes today at goal rate of 75ml/hr. Lipids at 20ml/hr, MVI and trace elements on MWF only due national shortage.  2. Increase insulin in TPN.  Monitor closely due to risk of hypoglycemia with octreotide.  3.  Plan home with TPN.  Melvin [name] 08/08/2011,12:10 PM

## 2011-08-08 NOTE — Progress Notes (Signed)
Pt. To be discharged to home on TPN with home health. TPN presently infusing at 95cc/hr. Spoke with pharmacy about stopping TPN suddenly. Pharmacist said to decrease rate to 40cc/hr for at least 15 minutes before discharging him. Primary nurse informed  Pt. Will have TPN stopped at 1615.

## 2011-08-08 NOTE — Discharge Instructions (Signed)
[facility] [phone] home tpn, hhrn  Do not take Spironolactone for two days, can start taking on monday

## 2011-08-08 NOTE — Progress Notes (Signed)
Inpatient Diabetes Program Recommendations  AACE/ADA: New Consensus Statement on Inpatient Glycemic Control (2009)  Target Ranges:  Prepandial:   less than 140 mg/dL      Peak postprandial:   less than 180 mg/dL (1-2 hours)      Critically ill patients:  140 - 180 mg/dL   Reason for Visit: CBGs greater than 180 mg/dl  Inpatient Diabetes Program Recommendations Insulin - Basal: Add Lantus 10 - 15 units daily if CBGs continue greater than 180 mg/dl.  Takes Lantus at home. Correction (SSI): continue Novolog MODERATE correction scale every 4 hours.  Note:

## 2011-08-22 ENCOUNTER — Ambulatory Visit (HOSPITAL_COMMUNITY): Payer: BC Managed Care – PPO

## 2011-08-22 ENCOUNTER — Encounter (HOSPITAL_COMMUNITY)
Admission: RE | Disposition: A | Discharge status: Home / Self Care | Payer: Self-pay | Source: Ambulatory Visit | Attending: Gastroenterology

## 2011-08-22 ENCOUNTER — Ambulatory Visit (HOSPITAL_COMMUNITY)
Admission: RE | Admit: 2011-08-22 | Discharge: 2011-08-22 | Disposition: A | Discharge status: Home / Self Care | Payer: BC Managed Care – PPO | Source: Ambulatory Visit | Attending: Gastroenterology | Admitting: Gastroenterology

## 2011-08-22 ENCOUNTER — Encounter (HOSPITAL_COMMUNITY): Payer: Self-pay | Admitting: *Deleted

## 2011-08-22 DIAGNOSIS — K219 Gastro-esophageal reflux disease without esophagitis: Secondary | ICD-10-CM | POA: Insufficient documentation

## 2011-08-22 DIAGNOSIS — E119 Type 2 diabetes mellitus without complications: Secondary | ICD-10-CM | POA: Insufficient documentation

## 2011-08-22 DIAGNOSIS — Z4689 Encounter for fitting and adjustment of other specified devices: Secondary | ICD-10-CM | POA: Insufficient documentation

## 2011-08-22 DIAGNOSIS — K831 Obstruction of bile duct: Secondary | ICD-10-CM | POA: Insufficient documentation

## 2011-08-22 HISTORY — PX: ERCP: SHX5425

## 2011-08-22 LAB — GLUCOSE, CAPILLARY

## 2011-08-22 SURGERY — ERCP, WITH INTERVENTION IF INDICATED
Anesthesia: Moderate Sedation

## 2011-08-22 MED ORDER — MIDAZOLAM HCL 10 MG/2ML IJ SOLN
INTRAMUSCULAR | Status: AC
  Filled 2011-08-22: qty 4

## 2011-08-22 MED ORDER — MIDAZOLAM HCL 10 MG/2ML IJ SOLN
INTRAMUSCULAR | Status: DC | PRN
  Administered 2011-08-22 (×4): 2 mg via INTRAVENOUS

## 2011-08-22 MED ORDER — GLUCAGON HCL (RDNA) 1 MG IJ SOLR
INTRAMUSCULAR | Status: AC
  Filled 2011-08-22: qty 2

## 2011-08-22 MED ORDER — CIPROFLOXACIN IN D5W 400 MG/200ML IV SOLN
INTRAVENOUS | Status: AC
  Filled 2011-08-22: qty 200

## 2011-08-22 MED ORDER — LACTATED RINGERS IV SOLN
INTRAVENOUS | Status: DC
  Administered 2011-08-22: 09:00:00 via INTRAVENOUS

## 2011-08-22 MED ORDER — GLUCAGON HCL (RDNA) 1 MG IJ SOLR
INTRAMUSCULAR | Status: DC | PRN
  Administered 2011-08-22: 1 mg via INTRAVENOUS

## 2011-08-22 MED ORDER — BUTAMBEN-TETRACAINE-BENZOCAINE 2-2-14 % EX AERO
INHALATION_SPRAY | CUTANEOUS | Status: DC | PRN
  Administered 2011-08-22: 2 via TOPICAL

## 2011-08-22 MED ORDER — FENTANYL CITRATE 0.05 MG/ML IJ SOLN
INTRAMUSCULAR | Status: DC | PRN
  Administered 2011-08-22 (×3): 25 ug via INTRAVENOUS

## 2011-08-22 MED ORDER — DIPHENHYDRAMINE HCL 50 MG/ML IJ SOLN
INTRAMUSCULAR | Status: AC
  Filled 2011-08-22: qty 1

## 2011-08-22 MED ORDER — DIPHENHYDRAMINE HCL 50 MG/ML IJ SOLN
INTRAMUSCULAR | Status: DC | PRN
  Administered 2011-08-22: 25 mg via INTRAVENOUS

## 2011-08-22 MED ORDER — CIPROFLOXACIN IN D5W 400 MG/200ML IV SOLN
400.0000 mg | Freq: Two times a day (BID) | INTRAVENOUS | Status: DC
  Administered 2011-08-22: 400 mg via INTRAVENOUS

## 2011-08-22 MED ORDER — SODIUM CHLORIDE 0.9 % IV SOLN
INTRAVENOUS | Status: DC | PRN
  Administered 2011-08-22: 10:00:00

## 2011-08-22 MED ORDER — FENTANYL CITRATE 0.05 MG/ML IJ SOLN
INTRAMUSCULAR | Status: AC
  Filled 2011-08-22: qty 4

## 2011-08-22 NOTE — Discharge Instructions (Signed)
Endoscopic Retrograde Cholangiopancreatography This is a test used to evaluate people with jaundice (a condition in which the person's skin is turning yellow because of the high level of bilirubin in your blood). Bilirubin is a product of the blood which is elevated when an obstruction in the bile duct occurs. The bile ducts are the pipe-like system which carry the bile from the liver to the gallbladder and out into your small bowel. In this test, a fiber optic endoscope (a small pencil-sized telescope) is inserted and a catheter is put into ducts for examination. This test can reveal stones, strictures, cysts, tumors, and other irregularities within the pancreatic ducts and bile ducts. PREPARATION FOR TEST Do not eat or drink after midnight the day before the test.  NORMAL FINDINGS Normal size of biliary and pancreatic ducts. No obstruction or filling defects within the biliary or pancreatic ducts. Ranges for normal findings may vary among different laboratories and hospitals. You should always check with your doctor after having lab work or other tests done to discuss the meaning of your test results and whether your values are considered within normal limits. MEANING OF TEST  Your caregiver will go over the test results with you and discuss the importance and meaning of your results, as well as treatment options and the need for additional tests if necessary. OBTAINING THE TEST RESULTS  It is your responsibility to obtain your test results. Ask the lab or department performing the test when and how you will get your results. Document Released: 09/19/2004 Document Revised: 02/05/2011 Document Reviewed: 05/05/2008 [facility] Patient Information 2012 [place], [place].

## 2011-08-22 NOTE — H&P (View-Only) (Signed)
Patient ID: Melvin [name], male   DOB: Nov 14, 1960, 51 y.o.   MRN: [id number] Subjective: No acute events.  He feels as if his ascites is decreasing.  Objective: Vital signs in last 24 hours: Temp:  [97.8 F (36.6 C)-98.9 F (37.2 C)] 98.6 F (37 C) (03/01 0555) Pulse Rate:  [92-105] 96  (03/01 0555) Resp:  [16-19] 16  (03/01 0555) BP: (112-177)/(77-116) 112/77 mmHg (03/01 0555) SpO2:  [95 %-99 %] 99 % (03/01 0555) Weight:  [73.5 kg (162 lb 0.6 oz)] 73.5 kg (162 lb 0.6 oz) (03/01 0555) Last BM Date: 08/05/11  Intake/Output from previous day: 02/28 0701 - 03/01 0700 In: 3043 [I.V.:486; IV Piggyback:50; TPN:2507] Out: 2025 [Urine:2025] Intake/Output this shift:    General appearance: alert and no distress GI: soft, distended with ascites, appears to be reducing  Lab Results: No results found for this basename: WBC:3,HGB:3,HCT:3,PLT:3 in the last 72 hours BMET  [facility] 08/07/11 0548  NA 135  K 3.3*  CL 95*  CO2 32  GLUCOSE 212*  BUN 10  CREATININE 0.43*  CALCIUM 8.7   LFT  Basename 08/07/11 0548  PROT 6.4  ALBUMIN 1.5*  AST 12  ALT 7  ALKPHOS 72  BILITOT 0.3  BILIDIR --  IBILI --   PT/INR No results found for this basename: LABPROT:2,INR:2 in the last 72 hours Hepatitis Panel No results found for this basename: HEPBSAG,HCVAB,HEPAIGM,HEPBIGM in the last 72 hours C-Diff No results found for this basename: CDIFFTOX:3 in the last 72 hours Fecal Lactopherrin No results found for this basename: FECLLACTOFRN in the last 72 hours  Studies/Results: No results found.  Medications:  Scheduled:   . ampicillin-sulbactam (UNASYN) IV  1.5 g Intravenous Q8H  . antiseptic oral rinse  15 mL Mouth Rinse q12n4p  . chlorhexidine  15 mL Mouth Rinse BID  . enoxaparin  40 mg Subcutaneous Q24H  . furosemide  40 mg Oral Daily  . insulin aspart  0-15 Units Subcutaneous Q4H  . magnesium sulfate 1 - 4 g bolus IVPB  2 g Intravenous Once  . octreotide  200 mcg Subcutaneous  Q12H  . potassium chloride  10 mEq Intravenous Q1 Hr x 3  . sodium chloride  10-40 mL Intracatheter Q12H  . spironolactone  100 mg Oral Daily  . thiamine  100 mg Oral Daily   Continuous:   . sodium chloride 20 mL/hr at 08/07/11 1737  . fat emulsion 250 mL (08/06/11 1726)  . TPN (CLINIMIX) +/- additives 95 mL/hr at 08/07/11 1729  . TPN (CLINIMIX) +/- additives 95 mL/hr at 08/06/11 1726    Assessment/Plan: 1) Pancreatic ascites from a ductal leak secondary to ETOH abuse. 2) ETOH abuse.   The patient's current standing weight (standing on the scale) is at 62 kg.  The bed weights are inaccurate.  Clinically it appears that his ascites is decreasing.  Plan: 1) Continue with TPN, NPO, Octreotide, and diuretics. 2) Standing weight daily. 3) Need to arrange for home TPN.  LOS: 11 days   Kunaal Walkins D 08/08/2011, 8:01 AM

## 2011-08-22 NOTE — H&P (Signed)
Reason for Consult: Pancreatic ascites Referring Physician: Jeani Hawking, M.D.  [address] HPI: This is a 51 year old patient with pancreatic ascites from ETOH abuse.  He was admitted to the hospital with jaundice and the paracentesis revealed a markedly elevated amylase.  No overt overt evidence of cirrhosis.  An ERCP was performed and it revealed a significant leak in the distal portion of the pancreatic duct.  A stent was placed, but it was not able to be placed across the leak. The stent was placed proximal to the leak and subsequently he was placed on TPN and strict NPO.  Diuretics and octreotide were started and his weight decreased.  Follow up in the office confirmed that he was continuing to drop his weight and the ascites markedly decreased.  He is maintained on diuretics, but he was not able to afford outpatient octreotide.  He is here today to see if he continues to have a duct leak.  Past Medical History  Diagnosis Date  . Diabetes mellitus   . Meningitis   . Pneumonia   . GERD (gastroesophageal reflux disease)   . Pancreatitis   . Angina     Past Surgical History  Procedure Date  . Tonsillectomy and adenoidectomy     "as a kid"  . Ercp 08/02/2011    Procedure: ENDOSCOPIC RETROGRADE CHOLANGIOPANCREATOGRAPHY (ERCP);  Surgeon: Theda Belfast, MD;  Location: [facility] ENDOSCOPY;  Service: Endoscopy;  Laterality: N/A;    Family History  Problem Relation Age of Onset  . Heart failure Mother     Social History:  reports that he has never smoked. He has never used smokeless tobacco. He reports that he drinks about 2.4 ounces of alcohol per week. He reports that he uses illicit drugs ("Crack" cocaine).  Allergies: No Known Allergies  Medications:  Scheduled:  Continuous:   . lactated ringers      No results found for this or any previous visit (from the past 24 hour(s)).   No results found.  ROS:  As stated above in the HPI otherwise negative.  Blood pressure 113/77,  pulse 94, temperature 97.9 F (36.6 C), temperature source Oral, resp. rate 18, SpO2 100.00%.    PE: Gen: NAD, Alert and Oriented HEENT:  [place]/AT, EOMI Neck: Supple, no LAD Lungs: CTA Bilaterally CV: RRR without M/G/R ABM: Soft, NTND, flat, +BS Ext: No C/C/E  Assessment/Plan: 1) Pancreatic ascites   He appears to have resolution of his ascites or minimal ascites at this time point.  I am encouraged to see that he has not drank any further ETOH.  I will perform an ERCP to check to see if he has a duct leak.  If he continues to have a duct leak I will replace the stent.  If there is no duct leak I will place in a smaller stent to help prevent post-ERCP pancreatitis.  This stent should fall out spontaneously.  After the initial ERCP he did have a 1 day course of presumed post-ERCP pancreatitis.  I did discuss this possibility with the patient.  If it is the case he will remain on TPN until issue resolves even if there is no duct leak.  Plan: 1) ERCP  Kaydense Rizo D 08/22/2011, 9:01 AM

## 2011-08-22 NOTE — Op Note (Signed)
[facility] [address] [place], [place]  [address]  ERCP PROCEDURE REPORT  PATIENT:  Melvin [name], Melvin [name]  MR#:  [id number] BIRTHDATE:  January 19, 1961  GENDER:  male ENDOSCOPIST:  Jeani Hawking, MD ASSISTANT:  Janae Sauce, RN, Kizzie Bane PROCEDURE DATE:  08/22/2011 PROCEDURE:  ERCP with stent change ASA CLASS:  Class III INDICATIONS:  stent placement  MEDICATIONS:   Fentanyl 75 mcg IV, Versed 8 mg IV, Benadryl 25 mg IV, glucagon 1 mg IV  DESCRIPTION OF PROCEDURE:   After the risks benefits and alternatives of the procedure were thoroughly explained, informed consent was obtained.  The Pentax ERCP [id number] endoscope was introduced through the mouth and advanced to the second portion of the duodenum.  In the duodenal bulb there was evidence of an extrinsic compression that was nonobstructing. ? pseudocyst compressing on the area. The Old 5 Fr x 7 cm stent was identified and it was removed with a snare. Cannulation of the CBD occurred several times until it was noted that the PD was to the right and superior to the CBD orifice. Cannulation of the PD was successful and the guidewire was passed into the distal CBD. Contrast injection did not reveal any leak in the distal PD, however, there did appear to be a small focal stricture at the site of the prior leak. Contrast did pass through this area into the distal PD. The sphincterotome was then advanced across this stenotic region in hopes of dilating the site. Because of the risk of post-ERCP pancreatitis contrast injection was minimized. Residual contrast was aspirated from the PD and a 4 Fr x 2 cm stent with the internal flange removed was insterted in to the proximal PD. No contrast was noted in the PD post procedure and the stent was in excellent position.    The scope was then completely withdrawn from the patient and the procedure terminated. <<PROCEDUREIMAGES>>  COMPLICATIONS:  None  ENDOSCOPIC  IMPRESSION: 1) No evidence of PD duct leak. 2) Small focal stricture at the prior leak site. 3) Nonobstructive extinsic compression in the duodenal bulb. RECOMMENDATIONS: 1) Continue wtih TPN for now. 2) Phone update on Monday. 3) If no evidence of post-ERCP pancreatitis a diet can be started and TPN weaned. 4) Check KUB in 1-2 weeks to see if the stent has spontaneously fallen out of the PD.  ______________________________ Jeani Hawking, MD  n. Rosalie DoctorJeani Hawking at 08/22/2011 10:29 AM  Fransisca Connors, [phone]

## 2011-08-22 NOTE — Interval H&P Note (Signed)
History and Physical Interval Note:  08/22/2011 9:00 AM  Melvin [name]  has presented today for surgery, with the diagnosis of pancreatitis  The various methods of treatment have been discussed with the patient and family. After consideration of risks, benefits and other options for treatment, the patient has consented to  Procedure(s) (LRB): ENDOSCOPIC RETROGRADE CHOLANGIOPANCREATOGRAPHY (ERCP) (N/A) as a surgical intervention .  The patients' history has been reviewed, patient examined, no change in status, stable for surgery.  I have reviewed the patients' chart and labs.  Questions were answered to the patient's satisfaction.     Daun Rens D

## 2011-08-27 ENCOUNTER — Encounter (HOSPITAL_COMMUNITY): Payer: Self-pay | Admitting: Gastroenterology

## 2011-09-08 ENCOUNTER — Other Ambulatory Visit: Payer: Self-pay | Admitting: Gastroenterology

## 2011-09-08 DIAGNOSIS — K859 Acute pancreatitis without necrosis or infection, unspecified: Secondary | ICD-10-CM

## 2011-09-11 ENCOUNTER — Ambulatory Visit
Admission: RE | Admit: 2011-09-11 | Discharge: 2011-09-11 | Disposition: A | Discharge status: Home / Self Care | Payer: BC Managed Care – PPO | Source: Ambulatory Visit | Attending: Gastroenterology | Admitting: Gastroenterology

## 2011-09-11 DIAGNOSIS — K859 Acute pancreatitis without necrosis or infection, unspecified: Secondary | ICD-10-CM

## 2011-09-15 LAB — AFB CULTURE WITH SMEAR (NOT AT ARMC)

## 2012-02-25 ENCOUNTER — Emergency Department (INDEPENDENT_AMBULATORY_CARE_PROVIDER_SITE_OTHER)
Admission: EM | Admit: 2012-02-25 | Discharge: 2012-02-25 | Disposition: A | Discharge status: Home / Self Care | Payer: Self-pay | Source: Home / Self Care | Attending: Emergency Medicine | Admitting: Emergency Medicine

## 2012-02-25 ENCOUNTER — Encounter (HOSPITAL_COMMUNITY): Payer: Self-pay | Admitting: Emergency Medicine

## 2012-02-25 ENCOUNTER — Telehealth (HOSPITAL_COMMUNITY): Payer: Self-pay | Admitting: *Deleted

## 2012-02-25 DIAGNOSIS — E119 Type 2 diabetes mellitus without complications: Secondary | ICD-10-CM

## 2012-02-25 HISTORY — DX: Heart failure, unspecified: I50.9

## 2012-02-25 LAB — POCT I-STAT, CHEM 8
Creatinine, Ser: 0.9 mg/dL (ref 0.50–1.35)
Glucose, Bld: 333 mg/dL — ABNORMAL HIGH (ref 70–99)
HCT: 47 % (ref 39.0–52.0)
Hemoglobin: 16 g/dL (ref 13.0–17.0)
Potassium: 4.5 mEq/L (ref 3.5–5.1)
Sodium: 135 mEq/L (ref 135–145)
TCO2: 27 mmol/L (ref 0–100)

## 2012-02-25 MED ORDER — FUROSEMIDE 40 MG PO TABS: 40.0000 mg | ORAL_TABLET | Freq: Every day | ORAL | Status: DC

## 2012-02-25 MED ORDER — FOLIC ACID 1 MG PO TABS: 1.0000 mg | ORAL_TABLET | Freq: Every day | ORAL | Status: DC

## 2012-02-25 MED ORDER — INSULIN NPH ISOPHANE & REGULAR (70-30) 100 UNIT/ML ~~LOC~~ SUSP: SUBCUTANEOUS | Status: DC

## 2012-02-25 MED ORDER — SPIRONOLACTONE 100 MG PO TABS: 100.0000 mg | ORAL_TABLET | Freq: Every day | ORAL | Status: DC

## 2012-02-25 MED ORDER — THIAMINE HCL 100 MG PO TABS: 100.0000 mg | ORAL_TABLET | Freq: Every day | ORAL | Status: DC

## 2012-02-25 NOTE — ED Notes (Signed)
Pt. called and said he went to the pharmacy on [address] [facility]) and they would not fill his Rx.'s because he does not have the orange card. I asked Harvin Hazel and she said to have pt. call [facility] @ [phone] or he can come on Fri. @ 0800 and see Lafonda Mosses to get the orange card. Pt. told what he needs to bring in for that. Vassie Moselle 02/25/2012

## 2012-02-25 NOTE — ED Notes (Signed)
Healing abrasions noted lt forehead and lt cheek area.

## 2012-02-25 NOTE — ED Provider Notes (Signed)
Chief Complaint  Patient presents with  . Blood Sugar Problem    History of Present Illness:   Melvin [name] is a 51 year old male who presents today for refills on medications. He has insulin requiring diabetes, hypertension, chronic pancreatitis, alcoholic liver disease, ascites, and he also had an episode of dizziness 5 days ago.  His diabetes has been going on since 2005. He had been seeing Dr. Loleta Chance. He was on 23 units of Lantus and 10 units of Apidera daily. He's been off of all insulin for a month and all his other medications for over a month. He saw a Engineer, water 5 days ago and his glucose was 429 at that time. He does note polyuria and polydipsia. His vision has been normal except for some excessive watering of his left eye. He denies any chest pain, tightness, pressure, or history of heart disease. He has had pain and numbness in his hands and feet and diminished feeling in his hands. He has difficulty in buttoning and unbuttoning his shirt today. He denies any hypoglycemic episodes recently. He's had no swelling or ulceration on his legs.  And he also has had a four-year history of high blood pressure. He had been on hydralazine 10 mg twice a day for that. He denies any headaches. He's had no medication side effects from the hydralazine. He has been out of his hydralazine now for over a month.  He also has a history of alcoholism, pancreatitis, ascites, and alcoholic liver disease. He was hospitalized for this in February of this year. He had a stent placement in his pancreatic duct because apparently there was some leakage from the pancreatic duct into the peritoneal cavity causing ascites. He was prescribed Sandostatin, but he could not afford this. He also was on spironolactone, furosemide, thiamine, and folate acid. He states he has not had anything to drink in the past several months.  He also has an episode of dizziness 5 days ago. He states he turned suddenly and lost his balance.  He denies loss of consciousness either before or after the episode. He fell and hit his face against a car. He has healing abrasions above and below his left eye. He states his eye is watering a lot but he thinks the vision is normal. It's been a little bit red and inflamed. He denies any headache, neck pain, eye pain, blurry vision, diplopia, or unilateral neurological symptoms. He does complain of numbness and tingling in both of his hands and feet.  Review of Systems:  Other than noted above, the patient denies any of the following symptoms. Systemic:  No fever, chills, sweats, fatigue, myalgias, headache, or anorexia. Eye:  No redness, pain or drainage. ENT:  No earache, nasal congestion, rhinorrhea, sinus pressure, or sore throat. Lungs:  No cough, sputum production, wheezing, shortness of breath.  Cardiovascular:  No chest pain, palpitations, or syncope. GI:  No nausea, vomiting, abdominal pain or diarrhea. GU:  No dysuria, frequency, or hematuria. Skin:  No rash or pruritis.  PMFSH:  Past medical history, family history, social history, meds, and allergies were reviewed.   Physical Exam:   Vital signs:  BP 120/87  Pulse 112  Temp 98.6 F (37 C) (Oral)  Resp 18  SpO2 98% General:  Alert, in no distress. Eye:  His pupils were unequal. His left pupil being 4 mm in his right pupil being 2 mm, he has a full range of EOMs.  He has some drooping of his left eyelid. Conjunctiva is  edematous with some chemosis and some injection of the eyelid. There was no discharge. Cornea was intact, anterior chamber was normal, fundus was normal. ENT:  TMs and canals were normal, without erythema or inflammation.  Nasal mucosa was clear and uncongested, without drainage.  Mucous membranes were moist.  Pharynx was clear, without exudate or drainage.  There were no oral ulcerations or lesions. Neck:  Supple, no adenopathy, tenderness or mass. Thyroid was normal. Lungs:  No respiratory distress.  Lungs were  clear to auscultation, without wheezes, rales or rhonchi.  Breath sounds were clear and equal bilaterally. Heart:  Regular rhythm, without gallops, murmers or rubs. Abdomen:  Soft, flat, and non-tender to palpation.  No hepatosplenomagaly or mass. Neuro exam:Neurological examination: The patient is alert and oriented x3. Speech is clear, fluent, and appropriate. Cranial nerves are intact. There is no pronator drift and finger to nose was normal. Muscle strength, sensation, and DTRs are normal. Babinskis are downgoing. Station and gait were normal. Romberg sign is negative, patient is able to perform tandem gait well. Skin:  Clear, warm, and dry, without rash or lesions.  Labs:   Results for orders placed during the hospital encounter of 02/25/12  POCT I-STAT, CHEM 8      Component Value Range   Sodium 135  135 - 145 mEq/L   Potassium 4.5  3.5 - 5.1 mEq/L   Chloride 98  96 - 112 mEq/L   BUN 11  6 - 23 mg/dL   Creatinine, Ser 1.19  0.50 - 1.35 mg/dL   Glucose, Bld 147 (*) 70 - 99 mg/dL   Calcium, Ion 8.29 (*) 1.12 - 1.23 mmol/L   TCO2 27  0 - 100 mmol/L   Hemoglobin 16.0  13.0 - 17.0 g/dL   HCT 56.2  13.0 - 86.5 %      Date: 02/25/2012  Rate: 111  Rhythm: sinus tachycardia  QRS Axis: normal  Intervals: normal  ST/T Wave abnormalities: normal  Conduction Disutrbances:none  Narrative Interpretation: Sinus tachycardia, otherwise normal EKG.  Old EKG Reviewed: none available  Assessment:  The encounter diagnosis was Diabetes mellitus.  His diabetes is not well controlled today. I'm afraid that the Lantus and Apidra would be too expensive for him, so I prescribed instead Humulin 70/30 at a dose of 26 units in the morning and 14 in the evening. I gave him enough for 3 months and he'll need to see a primary care physician thereafter. He states he's working on getting Medicaid and will go back to Dr. Loleta Chance. Since his blood pressure is normal today I did not think he needed anything for the  blood pressure per se, but to prevent recurrence of the ascites I did give him a refill on the spironolactone and furosemide. I encouraged him not to drink and gave him refills on his thiamine and folic acid. I also suggested that he may want to see a an ophthalmologist about the eye. Right now he has no funds and no insurance. If when he gets his Medicaid in place his eye symptoms persist, I suggested that he seek an ophthalmologist for followup on that.  Plan:   1.  The following meds were prescribed:   New Prescriptions   FOLIC ACID (FOLVITE) 1 MG TABLET    Take 1 tablet (1 mg total) by mouth daily.   FUROSEMIDE (LASIX) 40 MG TABLET    Take 1 tablet (40 mg total) by mouth daily.   INSULIN NPH-INSULIN REGULAR (HUMULIN 70/30 PEN) (70-30)  100 UNIT/ML INJECTION    Take 26 units in the morning and 14 units in the evening before a meal.   SPIRONOLACTONE (ALDACTONE) 100 MG TABLET    Take 1 tablet (100 mg total) by mouth daily.   THIAMINE 100 MG TABLET    Take 1 tablet (100 mg total) by mouth daily.   2.  The patient was instructed in symptomatic care and handouts were given. 3.  The patient was told to return if becoming worse in any way, if no better in 3 or 4 days, and given some red flag symptoms that would indicate earlier return.    Reuben Likes, MD 02/25/12 [phone]

## 2012-02-25 NOTE — ED Notes (Addendum)
Pt states seen by congregational nurse and FNP on Saturday.  States he has been out of all meds for a month.  Blood glucose was 429 at that time.  They sent him here for prescriptions. States he has been experiencing sharp pain and numbness in both hands for 1 month- states especially when he is doing work.  States he also had syncopal episode on Saturday with sustained abrasions to lt side of face.

## 2012-06-14 IMAGING — CR DG ABDOMEN 2V
3 series · 3 of 3 positions shown · non-contrast
Comparison: ERCP images of 08/22/2011 and 08/02/2011

CLINICAL DATA: History pancreatitis, evaluate for pancreatic stent
placement

ABDOMEN - 2 VIEW

[w abdomen upright]
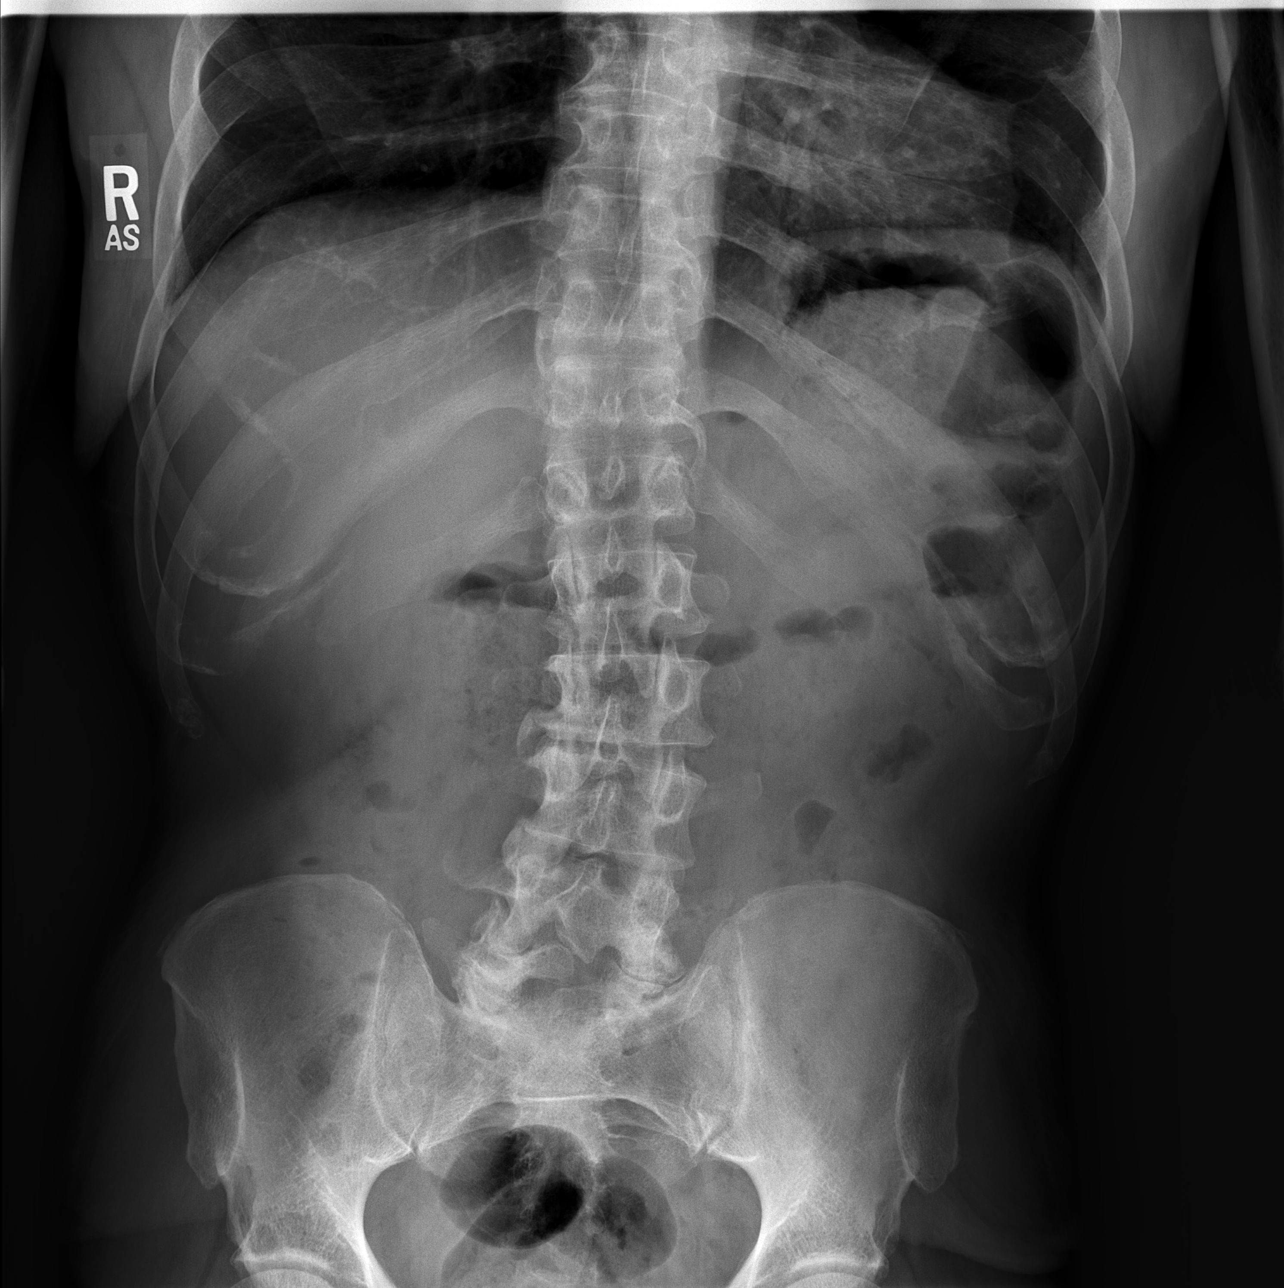

[t abdomen supine (1 of 2)]
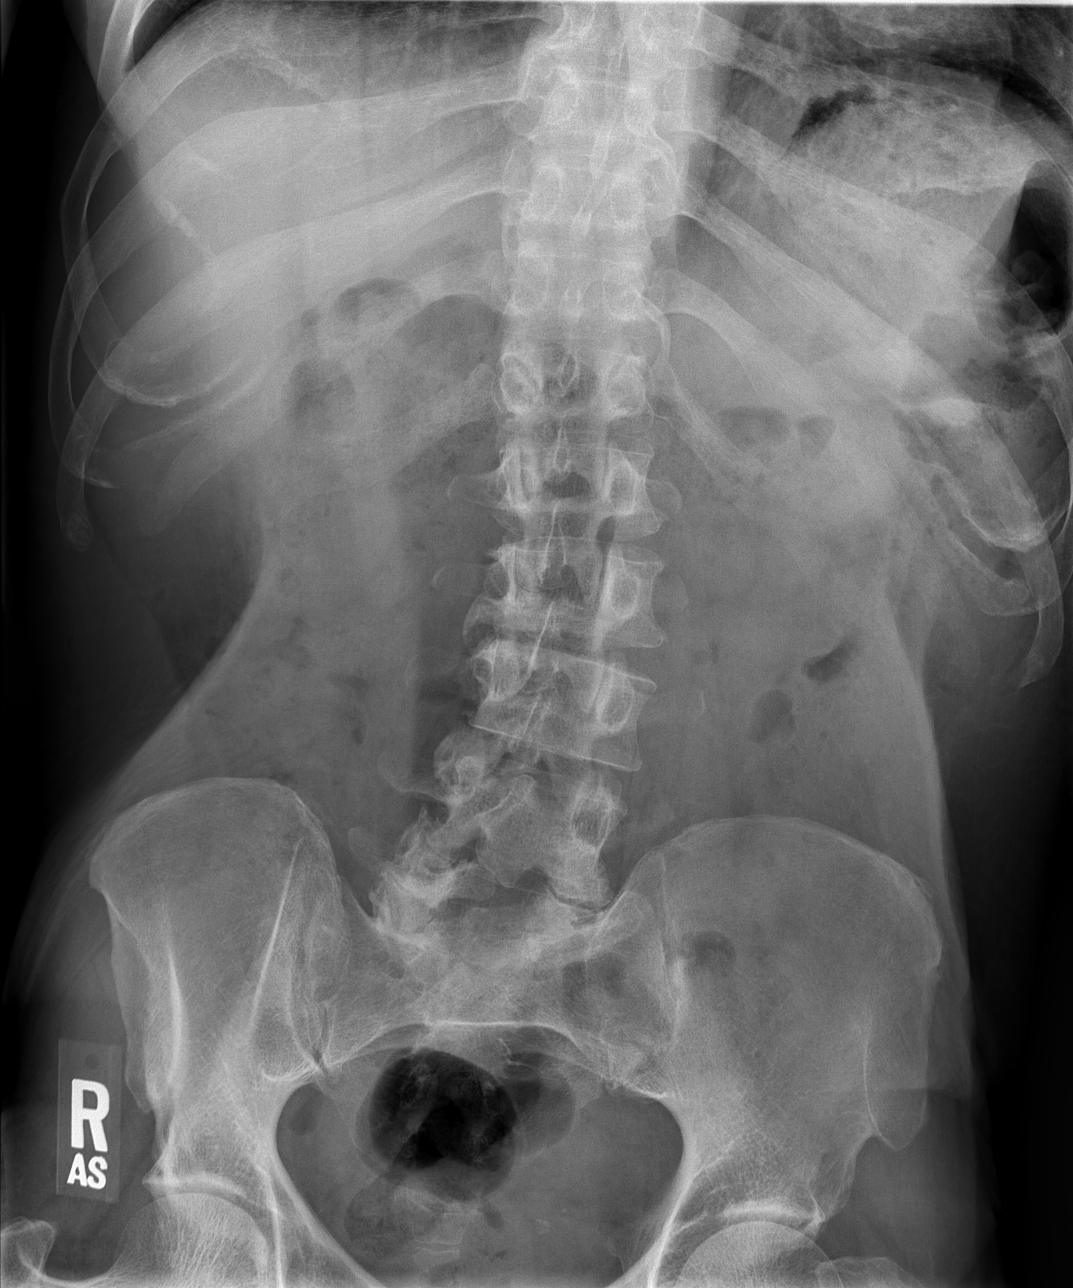

[t abdomen supine (2 of 2)]
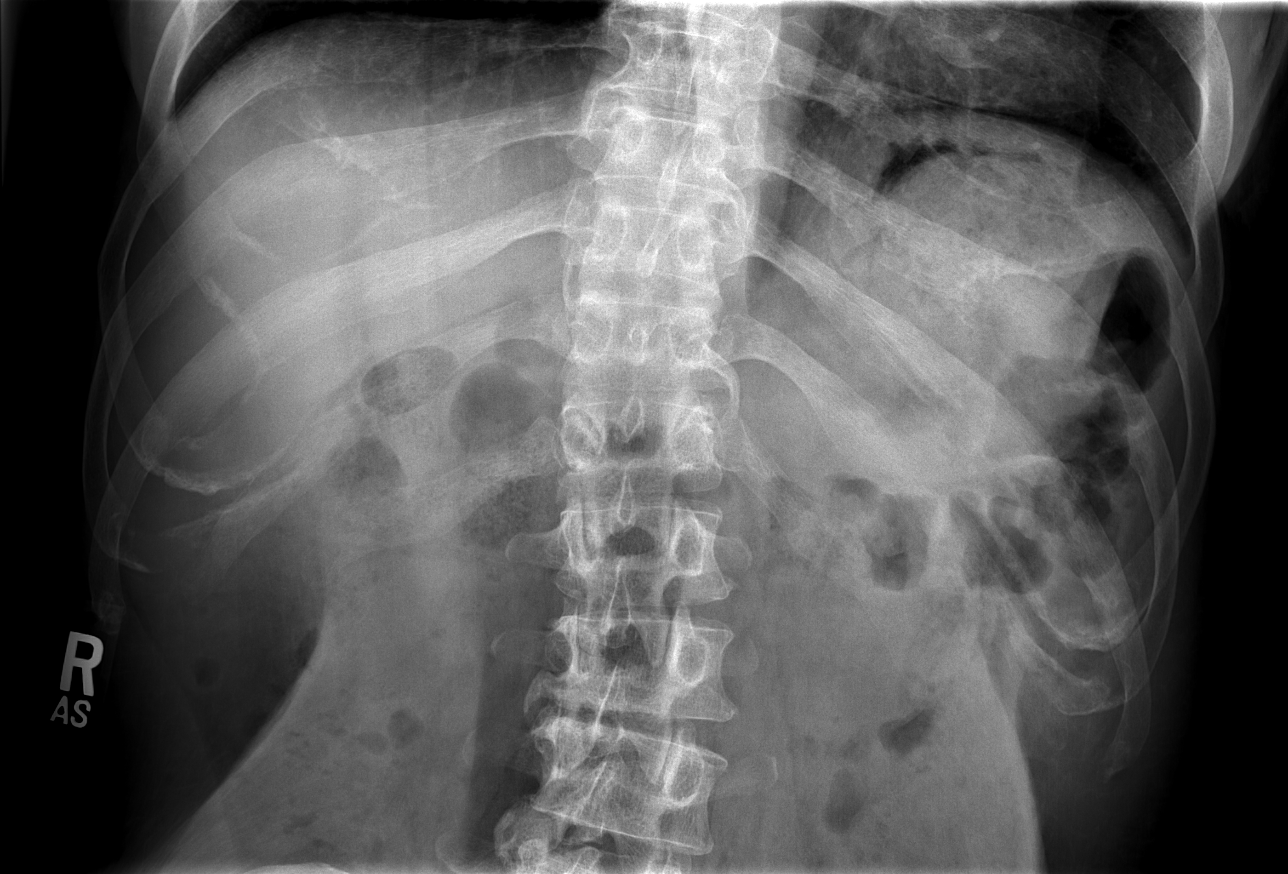

[3 of 3 positions shown; findings below may reference images not displayed]

FINDINGS: Supine views of the abdomen show a nonspecific bowel gas
pattern.  No opaque calculi are seen.  No opaque stent is evident.
There is a mild lumbar scoliosis convex to the left with
degenerative change.
IMPRESSION: Nonspecific bowel gas pattern.  No opaque stent is seen.

## 2014-10-24 ENCOUNTER — Emergency Department (HOSPITAL_COMMUNITY): Payer: Medicaid Other

## 2014-10-24 ENCOUNTER — Inpatient Hospital Stay (HOSPITAL_COMMUNITY)
Admission: EM | Admit: 2014-10-24 | Discharge: 2014-10-26 | DRG: 639 | Disposition: A | Discharge status: Home / Self Care | Payer: Medicaid Other | Attending: Family Medicine | Admitting: Family Medicine

## 2014-10-24 ENCOUNTER — Encounter (HOSPITAL_COMMUNITY): Payer: Self-pay | Admitting: *Deleted

## 2014-10-24 ENCOUNTER — Telehealth: Payer: Self-pay | Admitting: Family Medicine

## 2014-10-24 DIAGNOSIS — E11649 Type 2 diabetes mellitus with hypoglycemia without coma: Principal | ICD-10-CM | POA: Diagnosis present

## 2014-10-24 DIAGNOSIS — I10 Essential (primary) hypertension: Secondary | ICD-10-CM | POA: Insufficient documentation

## 2014-10-24 DIAGNOSIS — R310 Gross hematuria: Secondary | ICD-10-CM | POA: Diagnosis present

## 2014-10-24 DIAGNOSIS — F1020 Alcohol dependence, uncomplicated: Secondary | ICD-10-CM | POA: Insufficient documentation

## 2014-10-24 DIAGNOSIS — Z599 Problem related to housing and economic circumstances, unspecified: Secondary | ICD-10-CM

## 2014-10-24 DIAGNOSIS — Z9114 Patient's other noncompliance with medication regimen: Secondary | ICD-10-CM | POA: Diagnosis present

## 2014-10-24 DIAGNOSIS — T68XXXA Hypothermia, initial encounter: Secondary | ICD-10-CM | POA: Diagnosis present

## 2014-10-24 DIAGNOSIS — R55 Syncope and collapse: Secondary | ICD-10-CM | POA: Insufficient documentation

## 2014-10-24 DIAGNOSIS — A6000 Herpesviral infection of urogenital system, unspecified: Secondary | ICD-10-CM | POA: Insufficient documentation

## 2014-10-24 DIAGNOSIS — E876 Hypokalemia: Secondary | ICD-10-CM | POA: Diagnosis present

## 2014-10-24 DIAGNOSIS — R0609 Other forms of dyspnea: Secondary | ICD-10-CM | POA: Diagnosis not present

## 2014-10-24 DIAGNOSIS — I509 Heart failure, unspecified: Secondary | ICD-10-CM | POA: Insufficient documentation

## 2014-10-24 DIAGNOSIS — Z79899 Other long term (current) drug therapy: Secondary | ICD-10-CM

## 2014-10-24 DIAGNOSIS — E119 Type 2 diabetes mellitus without complications: Secondary | ICD-10-CM | POA: Insufficient documentation

## 2014-10-24 DIAGNOSIS — R531 Weakness: Secondary | ICD-10-CM | POA: Insufficient documentation

## 2014-10-24 DIAGNOSIS — N485 Ulcer of penis: Secondary | ICD-10-CM | POA: Diagnosis present

## 2014-10-24 DIAGNOSIS — R680 Hypothermia, not associated with low environmental temperature: Secondary | ICD-10-CM | POA: Diagnosis present

## 2014-10-24 DIAGNOSIS — F1010 Alcohol abuse, uncomplicated: Secondary | ICD-10-CM

## 2014-10-24 DIAGNOSIS — R0600 Dyspnea, unspecified: Secondary | ICD-10-CM | POA: Diagnosis not present

## 2014-10-24 DIAGNOSIS — F1410 Cocaine abuse, uncomplicated: Secondary | ICD-10-CM | POA: Insufficient documentation

## 2014-10-24 DIAGNOSIS — R002 Palpitations: Secondary | ICD-10-CM | POA: Diagnosis not present

## 2014-10-24 DIAGNOSIS — Z794 Long term (current) use of insulin: Secondary | ICD-10-CM | POA: Diagnosis not present

## 2014-10-24 DIAGNOSIS — K219 Gastro-esophageal reflux disease without esophagitis: Secondary | ICD-10-CM | POA: Diagnosis present

## 2014-10-24 DIAGNOSIS — H538 Other visual disturbances: Secondary | ICD-10-CM | POA: Insufficient documentation

## 2014-10-24 DIAGNOSIS — B009 Herpesviral infection, unspecified: Secondary | ICD-10-CM | POA: Insufficient documentation

## 2014-10-24 DIAGNOSIS — T68XXXD Hypothermia, subsequent encounter: Secondary | ICD-10-CM | POA: Diagnosis not present

## 2014-10-24 LAB — URINALYSIS, ROUTINE W REFLEX MICROSCOPIC
GLUCOSE, UA: 500 mg/dL — AB
Ketones, ur: 15 mg/dL — AB
LEUKOCYTES UA: NEGATIVE
Nitrite: NEGATIVE
Protein, ur: 30 mg/dL — AB
SPECIFIC GRAVITY, URINE: 1.023 (ref 1.005–1.030)
UROBILINOGEN UA: 1 mg/dL (ref 0.0–1.0)
pH: 5 (ref 5.0–8.0)

## 2014-10-24 LAB — CBC WITH DIFFERENTIAL/PLATELET
BASOS ABS: 0 10*3/uL (ref 0.0–0.1)
Basophils Relative: 0 % (ref 0–1)
EOS PCT: 1 % (ref 0–5)
Eosinophils Absolute: 0.1 10*3/uL (ref 0.0–0.7)
HCT: 40 % (ref 39.0–52.0)
HEMOGLOBIN: 14.2 g/dL (ref 13.0–17.0)
Lymphocytes Relative: 24 % (ref 12–46)
Lymphs Abs: 1.1 10*3/uL (ref 0.7–4.0)
MCH: 32.3 pg (ref 26.0–34.0)
MCHC: 35.5 g/dL (ref 30.0–36.0)
MCV: 90.9 fL (ref 78.0–100.0)
Monocytes Absolute: 0.5 10*3/uL (ref 0.1–1.0)
Monocytes Relative: 10 % (ref 3–12)
NEUTROS ABS: 3 10*3/uL (ref 1.7–7.7)
NEUTROS PCT: 65 % (ref 43–77)
PLATELETS: 197 10*3/uL (ref 150–400)
RBC: 4.4 MIL/uL (ref 4.22–5.81)
RDW: 13.3 % (ref 11.5–15.5)
WBC: 4.7 10*3/uL (ref 4.0–10.5)

## 2014-10-24 LAB — COMPREHENSIVE METABOLIC PANEL
ALK PHOS: 77 U/L (ref 38–126)
ALT: 19 U/L (ref 17–63)
ANION GAP: 11 (ref 5–15)
AST: 32 U/L (ref 15–41)
Albumin: 3.7 g/dL (ref 3.5–5.0)
BUN: 8 mg/dL (ref 6–20)
CO2: 26 mmol/L (ref 22–32)
Calcium: 9.2 mg/dL (ref 8.9–10.3)
Chloride: 100 mmol/L — ABNORMAL LOW (ref 101–111)
Creatinine, Ser: 0.77 mg/dL (ref 0.61–1.24)
GLUCOSE: 110 mg/dL — AB (ref 65–99)
POTASSIUM: 2.9 mmol/L — AB (ref 3.5–5.1)
Sodium: 137 mmol/L (ref 135–145)
TOTAL PROTEIN: 7.6 g/dL (ref 6.5–8.1)
Total Bilirubin: 1.4 mg/dL — ABNORMAL HIGH (ref 0.3–1.2)

## 2014-10-24 LAB — CBG MONITORING, ED: GLUCOSE-CAPILLARY: 73 mg/dL (ref 65–99)

## 2014-10-24 LAB — RAPID URINE DRUG SCREEN, HOSP PERFORMED
Amphetamines: NOT DETECTED
BARBITURATES: NOT DETECTED
Benzodiazepines: NOT DETECTED
Cocaine: POSITIVE — AB
Opiates: NOT DETECTED
TETRAHYDROCANNABINOL: NOT DETECTED

## 2014-10-24 LAB — TROPONIN I

## 2014-10-24 LAB — I-STAT TROPONIN, ED: TROPONIN I, POC: 0 ng/mL (ref 0.00–0.08)

## 2014-10-24 LAB — PROTIME-INR
INR: 1.11 (ref 0.00–1.49)
Prothrombin Time: 14.4 seconds (ref 11.6–15.2)

## 2014-10-24 LAB — CLOSTRIDIUM DIFFICILE BY PCR: Toxigenic C. Difficile by PCR: NEGATIVE

## 2014-10-24 LAB — URINE MICROSCOPIC-ADD ON

## 2014-10-24 LAB — MAGNESIUM: MAGNESIUM: 1.4 mg/dL — AB (ref 1.7–2.4)

## 2014-10-24 LAB — TSH: TSH: 0.773 u[IU]/mL (ref 0.350–4.500)

## 2014-10-24 LAB — POC OCCULT BLOOD, ED: FECAL OCCULT BLD: NEGATIVE

## 2014-10-24 LAB — GLUCOSE, CAPILLARY
GLUCOSE-CAPILLARY: 212 mg/dL — AB (ref 65–99)
Glucose-Capillary: 107 mg/dL — ABNORMAL HIGH (ref 65–99)
Glucose-Capillary: 114 mg/dL — ABNORMAL HIGH (ref 65–99)

## 2014-10-24 LAB — I-STAT CG4 LACTIC ACID, ED
LACTIC ACID, VENOUS: 1.01 mmol/L (ref 0.5–2.0)
Lactic Acid, Venous: 1.92 mmol/L (ref 0.5–2.0)

## 2014-10-24 LAB — LIPASE, BLOOD: Lipase: 14 U/L — ABNORMAL LOW (ref 22–51)

## 2014-10-24 LAB — OCCULT BLOOD X 1 CARD TO LAB, STOOL: Fecal Occult Bld: NEGATIVE

## 2014-10-24 LAB — MRSA PCR SCREENING: MRSA BY PCR: POSITIVE — AB

## 2014-10-24 LAB — ETHANOL

## 2014-10-24 MED ORDER — INSULIN ASPART 100 UNIT/ML ~~LOC~~ SOLN
0.0000 [IU] | Freq: Three times a day (TID) | SUBCUTANEOUS | Status: DC
  Administered 2014-10-24 – 2014-10-26 (×4): 3 [IU] via SUBCUTANEOUS
  Administered 2014-10-26: 5 [IU] via SUBCUTANEOUS

## 2014-10-24 MED ORDER — MAGNESIUM CHLORIDE 64 MG PO TBEC
2.0000 | DELAYED_RELEASE_TABLET | Freq: Every day | ORAL | Status: DC
  Administered 2014-10-24 – 2014-10-26 (×3): 128 mg via ORAL
  Filled 2014-10-24 (×3): qty 2

## 2014-10-24 MED ORDER — POTASSIUM CHLORIDE CRYS ER 20 MEQ PO TBCR
20.0000 meq | EXTENDED_RELEASE_TABLET | Freq: Once | ORAL | Status: AC
  Administered 2014-10-24: 20 meq via ORAL
  Filled 2014-10-24: qty 1

## 2014-10-24 MED ORDER — THIAMINE HCL 100 MG/ML IJ SOLN
Freq: Once | INTRAVENOUS | Status: AC
  Administered 2014-10-24: 17:00:00 via INTRAVENOUS
  Filled 2014-10-24: qty 1000

## 2014-10-24 MED ORDER — THIAMINE HCL 100 MG/ML IJ SOLN
100.0000 mg | Freq: Every day | INTRAMUSCULAR | Status: DC
  Filled 2014-10-24: qty 1

## 2014-10-24 MED ORDER — CHLORHEXIDINE GLUCONATE CLOTH 2 % EX PADS
6.0000 | MEDICATED_PAD | Freq: Every day | CUTANEOUS | Status: DC
  Administered 2014-10-25 – 2014-10-26 (×2): 6 via TOPICAL

## 2014-10-24 MED ORDER — FOLIC ACID 1 MG PO TABS
1.0000 mg | ORAL_TABLET | Freq: Every day | ORAL | Status: DC
  Administered 2014-10-25 – 2014-10-26 (×2): 1 mg via ORAL
  Filled 2014-10-24 (×2): qty 1

## 2014-10-24 MED ORDER — ADULT MULTIVITAMIN W/MINERALS CH
1.0000 | ORAL_TABLET | Freq: Every day | ORAL | Status: DC
  Administered 2014-10-25 – 2014-10-26 (×2): 1 via ORAL
  Filled 2014-10-24 (×2): qty 1

## 2014-10-24 MED ORDER — MUPIROCIN 2 % EX OINT
1.0000 "application " | TOPICAL_OINTMENT | Freq: Two times a day (BID) | CUTANEOUS | Status: DC
  Administered 2014-10-24 – 2014-10-26 (×4): 1 via NASAL
  Filled 2014-10-24 (×2): qty 22

## 2014-10-24 MED ORDER — SODIUM CHLORIDE 0.9 % IJ SOLN
3.0000 mL | Freq: Two times a day (BID) | INTRAMUSCULAR | Status: DC
  Administered 2014-10-24 – 2014-10-25 (×4): 3 mL via INTRAVENOUS

## 2014-10-24 MED ORDER — VITAMIN B-1 100 MG PO TABS
100.0000 mg | ORAL_TABLET | Freq: Every day | ORAL | Status: DC
  Administered 2014-10-25 – 2014-10-26 (×2): 100 mg via ORAL
  Filled 2014-10-24 (×2): qty 1

## 2014-10-24 MED ORDER — LORAZEPAM 0.5 MG PO TABS: 1.0000 mg | ORAL_TABLET | Freq: Four times a day (QID) | ORAL | Status: DC | PRN

## 2014-10-24 MED ORDER — ASPIRIN EC 81 MG PO TBEC
81.0000 mg | DELAYED_RELEASE_TABLET | Freq: Every day | ORAL | Status: DC
  Administered 2014-10-24 – 2014-10-26 (×3): 81 mg via ORAL
  Filled 2014-10-24 (×3): qty 1

## 2014-10-24 MED ORDER — HEPARIN SODIUM (PORCINE) 5000 UNIT/ML IJ SOLN
5000.0000 [IU] | Freq: Three times a day (TID) | INTRAMUSCULAR | Status: DC
  Administered 2014-10-24 – 2014-10-25 (×5): 5000 [IU] via SUBCUTANEOUS
  Filled 2014-10-24 (×8): qty 1

## 2014-10-24 MED ORDER — INSULIN GLARGINE 100 UNIT/ML ~~LOC~~ SOLN
8.0000 [IU] | Freq: Every day | SUBCUTANEOUS | Status: DC
  Administered 2014-10-25 – 2014-10-26 (×2): 8 [IU] via SUBCUTANEOUS
  Filled 2014-10-24 (×4): qty 0.08

## 2014-10-24 MED ORDER — LORAZEPAM 2 MG/ML IJ SOLN: 1.0000 mg | Freq: Four times a day (QID) | INTRAMUSCULAR | Status: DC | PRN

## 2014-10-24 MED ORDER — SODIUM CHLORIDE 0.9 % IV BOLUS (SEPSIS)
500.0000 mL | Freq: Once | INTRAVENOUS | Status: AC
  Administered 2014-10-24: 500 mL via INTRAVENOUS

## 2014-10-24 NOTE — Telephone Encounter (Signed)
error 

## 2014-10-24 NOTE — ED Provider Notes (Signed)
CSN: [id number]     Arrival date & time 10/24/14  1052 History   First MD Initiated Contact with Patient 10/24/14 1102     Chief Complaint  Patient presents with  . Weakness  . Near Syncope  Low glucose/ near syncope/weakness   (Consider location/radiation/quality/duration/timing/severity/associated sxs/prior Treatment) Patient is a 54 y.o. [name] presenting with hypoglycemia. The history is provided by the patient.  Hypoglycemia Associated symptoms: no shortness of breath    Melvin [name] is a 54 y.o [name] with a history of DM, GERD, pancreatitis, CHF, and HTN who presents for hypoglycemia and near syncope this morning. The near syncopal episode was unwitnessed. His daughter states that he had 6 episodes of diarrhea yesterday and this morning he was not speaking appropriately.  He states he checked his blood glucose this morning and it was 170 and then he took 8 units of insulin.  His CBG was 73 upon arrival in the ED.  He states he takes 8 units of insulin in the morning and 8 units in the afternoon.  He admits to skipping several doses in the last 2 weeks. He is also complaining that he has been feeling very hot but no chills or fever. His daughter states that he is speaking very slowly and is not his normal self. He denies any vomiting or rectal bleeding.  Past Medical History  Diagnosis Date  . Diabetes mellitus   . Meningitis   . Pneumonia   . GERD (gastroesophageal reflux disease)   . Pancreatitis   . Angina   . CHF (congestive heart failure)   . Hypertension    Past Surgical History  Procedure Laterality Date  . Tonsillectomy and adenoidectomy      "as a kid"  . Ercp  08/02/2011    Procedure: ENDOSCOPIC RETROGRADE CHOLANGIOPANCREATOGRAPHY (ERCP);  Surgeon: Beryle Beams, MD;  Location: [facility] ENDOSCOPY;  Service: Endoscopy;  Laterality: N/A;  . Ercp  08/22/2011    Procedure: ENDOSCOPIC RETROGRADE CHOLANGIOPANCREATOGRAPHY (ERCP);  Surgeon: Beryle Beams, MD;  Location: [facility] ENDOSCOPY;   Service: Endoscopy;  Laterality: N/A;  . Tympanostomy tube placement     Family History  Problem Relation Age of Onset  . Heart failure Mother    History  Substance Use Topics  . Smoking status: Never Smoker   . Smokeless tobacco: Never Used  . Alcohol Use: No     Comment: Past daily use    Review of Systems  Constitutional: Negative for fever and chills.  Respiratory: Negative for shortness of breath.   Cardiovascular: Negative for chest pain and leg swelling.  Gastrointestinal: Positive for diarrhea. Negative for abdominal pain and blood in stool.  All other systems reviewed and are negative.     Allergies  Review of patient's allergies indicates no known allergies.  Home Medications   Prior to Admission medications   Medication Sig Start Date End Date Taking? Authorizing Provider  insulin glargine (LANTUS) 100 UNIT/ML injection Inject 8 Units into the skin daily.    Yes Historical Provider, MD  insulin NPH-insulin regular (HUMULIN 70/30 PEN) (70-30) 100 UNIT/ML injection Take 26 units in the morning and 14 units in the evening before a meal. 02/25/12  Yes Harden Mo, MD  folic acid (FOLVITE) 1 MG tablet Take 1 tablet (1 mg total) by mouth daily. Patient not taking: Reported on 10/24/2014 02/25/12   Harden Mo, MD  furosemide (LASIX) 40 MG tablet Take 1 tablet (40 mg total) by mouth daily. 08/08/11 08/07/12  Frederich Chick  Toney Sang, MD  furosemide (LASIX) 40 MG tablet Take 1 tablet (40 mg total) by mouth daily. Patient not taking: Reported on 10/24/2014 02/25/12   Harden Mo, MD  hydrALAZINE (APRESOLINE) 10 MG tablet Take 2.5 tablets (25 mg total) by mouth 2 (two) times daily. 08/08/11 08/07/12  Oswald Hillock, MD  insulin glulisine (APIDRA OPTICLIK) 100 UNIT/ML injection Inject 2 Units into the skin 3 (three) times daily before meals. Check Blood glucose three times daily 0-150 - no units 151-200 2 units 201-250 4 units 251-300 6 units 301-350 8 units 351-400 10 units. > 400 give  12 units and call MD 08/08/11 08/07/12  Oswald Hillock, MD  octreotide (SANDOSTATIN) 100 MCG/ML SOLN Inject 2 mLs (200 mcg total) into the skin every 12 (twelve) hours. Patient not taking: Reported on 10/24/2014 08/08/11   Oswald Hillock, MD  pantoprazole (PROTONIX) 40 MG tablet Take 1 tablet (40 mg total) by mouth daily. 05/30/11 05/29/12  Monika Salk, MD  spironolactone (ALDACTONE) 100 MG tablet Take 1 tablet (100 mg total) by mouth daily. 08/08/11 08/07/12  Oswald Hillock, MD  spironolactone (ALDACTONE) 100 MG tablet Take 1 tablet (100 mg total) by mouth daily. Patient not taking: Reported on 10/24/2014 02/25/12   Harden Mo, MD  thiamine 100 MG tablet Take 1 tablet (100 mg total) by mouth daily. Patient not taking: Reported on 10/24/2014 02/25/12   Harden Mo, MD   BP 128/85 mmHg  Pulse 67  Temp(Src) 96 F (35.6 C) (Rectal)  Resp 15  SpO2 100% Physical Exam  Constitutional: He is oriented to person, place, and time. He appears well-developed and well-nourished.  HENT:  Head: Normocephalic and atraumatic.  Eyes: Conjunctivae are normal.  Neck: Normal range of motion. Neck supple.  Cardiovascular: Normal rate, regular rhythm and normal heart sounds.   Pulmonary/Chest: Effort normal and breath sounds normal. No respiratory distress. He has no wheezes. He has no rales.  Abdominal: Soft. There is no tenderness.  Musculoskeletal: Normal range of motion.  Neurological: He is alert and oriented to person, place, and time.  Skin: Skin is warm and dry.  Nursing note and vitals reviewed.   ED Course  Procedures (including critical care time) Labs Review Labs Reviewed  COMPREHENSIVE METABOLIC PANEL - Abnormal; Notable for the following:    Potassium 2.9 (*)    Chloride 100 (*)    Glucose, Bld 110 (*)    Total Bilirubin 1.4 (*)    All other components within normal limits  LIPASE, BLOOD - Abnormal; Notable for the following:    Lipase 14 (*)    All other components within normal limits   URINALYSIS, ROUTINE W REFLEX MICROSCOPIC - Abnormal; Notable for the following:    Color, Urine AMBER (*)    APPearance HAZY (*)    Glucose, UA 500 (*)    Hgb urine dipstick MODERATE (*)    Bilirubin Urine SMALL (*)    Ketones, ur 15 (*)    Protein, ur 30 (*)    All other components within normal limits  URINE MICROSCOPIC-ADD ON - Abnormal; Notable for the following:    Casts GRANULAR CAST (*)    Crystals CA OXALATE CRYSTALS (*)    All other components within normal limits  CULTURE, BLOOD (ROUTINE X 2)  CULTURE, BLOOD (ROUTINE X 2)  URINE CULTURE  CBC WITH DIFFERENTIAL/PLATELET  CBG MONITORING, ED  POC OCCULT BLOOD, ED  I-STAT CG4 LACTIC ACID, ED  Randolm Idol, ED  I-STAT CG4 LACTIC ACID, ED    Imaging Review Dg Chest Port 1 View  10/24/2014   CLINICAL DATA:  Hypoglycemia.  EXAM: PORTABLE CHEST - 1 VIEW  COMPARISON:  07/29/2008  FINDINGS: Improved aeration of the left lung since prior radiograph was some scarring present in the left lung. Mild atelectasis/scarring present at both lung bases. There is no evidence of pulmonary edema, consolidation, pneumothorax, nodule or pleural fluid. The heart size is normal.  IMPRESSION: No acute findings.  Bilateral pulmonary scarring.   Electronically Signed   By: Aletta Edouard M.D.   On: 10/24/2014 12:08     EKG Interpretation   Date/Time:  Tuesday Oct 24 2014 12:08:55 EDT Ventricular Rate:  59 PR Interval:  173 QRS Duration: 89 QT Interval:  490 QTC Calculation: 485 R Axis:   52 Text Interpretation:  Sinus rhythm Borderline prolonged QT interval No  significant change since last tracing Confirmed by Mingo Amber  MD, Britton  ([id number]) on 10/24/2014 2:00:12 PM      MDM   Final diagnoses:  Hypothermia, initial encounter  Near syncope  Weakness   Patient presents for hypoglycemia, diarrhea, and unwitnessed near syncopal episode this morning. Upon arrival his rectal temp was 93.0 and CBG was 73.  He was speaking appropriately but  slowly. We put him in the bear hugger and started fluids. He had a normal lactic acid and WBC. He is not tachycardic and he is 100% oxygen on room air. I could now find a source of infection so I did not start antibiotics.  He was hypokalemic and given potassium in the ED. Medications  sodium chloride 0.9 % bolus 500 mL (0 mLs Intravenous Stopped 10/24/14 1219)  potassium chloride SA (K-DUR,KLOR-CON) CR tablet 20 mEq (20 mEq Oral Given 10/24/14 1251)  I spoke to Dr. Mingo Amber, B.who has seen the patient and agrees with the plan.  13:20 Repeat temp is 96.0 13:28 I spoke to Dr. Jerline Pain, K.  who agreed with admission to step down.     Ottie Glazier, PA-C 10/24/14 1403  Evelina Bucy, MD 10/24/14 [phone]

## 2014-10-24 NOTE — ED Notes (Signed)
Pt came from home.  Pt states he felt sweaty this AM and began to "not be able to see" and thinks he had LOC unwitnessed.  Denies hitting head or falling.  Pt states he has felt Hot and sweaty for two weeks but denies fever.  States he has had diarrhea for 4 days.  Denies blood in vomit or stool.

## 2014-10-24 NOTE — ED Notes (Signed)
Lab at bedside.  Community liason at bedside.

## 2014-10-24 NOTE — H&P (Addendum)
FMTS ATTENDING ADMISSION NOTE Melvin Steil,MD I  have seen and examined this patient, reviewed their chart. I have discussed this patient with the resident. I agree with the resident's findings, assessment and care plan.   54 Y/O M with pmx of DM2, ETOH abuse, CHF, Angina, presented with hx of diarrhea for more than 1 wk, he moves his bowel about 3-4 times daily, he denies N/V, no blood in his stool, no belly pain, he denied sick contact.      This morning, he woke up with chills and excessive sweating, he stated he noticed his sugar was low then. He does take insulin at home for his DM2 and eats regularly. He denies headache, no LOC. He endorsed hx of intermittent eye pain and blurry vision on both eyes but now on his left eye. Pain is sharp in nuture about 4/10 in severity, worse with movement and light.         Exam:    Gen: Calm in bed, not in distress.    Neuro/HEENT: Awake and alert, oriented x 3, no facial asymmetry. Left conjunctiva hyperemia (mild). Pupillary reflex on left sluggish but reactive. DTR++ globally. Gait not assessed.    Heart: S1 S2 normal, no murmur.    Resp: Air entry equal and clear B/L    Abd: Soft, NT/ND, BS+ and normal.    Ext: No edema.            A/P: 54 Y/O M with    1. Mild- moderate hypothermia.     Etiology unclear.     Per patient home temp is normal.     R/O sepsis although unlikely.     F/U urine and blood culture.     Risk stratification lab. CBC, BMET, TSH.     Temp currently back to normal.        2. DM2 with hypoglycemia.     Serum glucose on admission looks good.     Hold Long acting insulin and place on SSI.     Monitor CBG. May restart Lantus once glucose stabilizes.        3. Enteritis: ?? Bacterial or viral.     Check C.Diff.     ?? Mild dehydration.     May improve hydration orally.        4. Vision problem.      Etiology  unclear.     ?? Glaucoma ( open vs closed angle).      Currently symptomatic.     Resident advised to consult ophthalmologist today.  5. Hypokalemia: Replace K+ Recheck Bmet.      6. HTN/CHF:    Review home regimen and restart as appropriate.    Monitor vital signs.    No recent ECHO, plan to recheck.

## 2014-10-24 NOTE — ED Notes (Signed)
Code sepsis cancelled per MD Evelina Bucy.

## 2014-10-24 NOTE — H&P (Signed)
[facility] Admission History and Physical Service Pager: [phone]  Patient name: Melvin [name] Medical record number: [id number] Date of birth: 1960-11-18 Age: 54 y.o. Gender: male  Primary Care Provider: Maggie Font, MD Consultants: none Code Status: full  Chief Complaint: presyncope  Assessment and Plan: Melvin [name] is a 55 y.o. male presenting with presyncope and hypothermia. PMH is significant for DM2; HF; HTN  # Presyncope & Hypothermia: Possibly due to cocaine (UDS + on admit) vs hypoglycemia (cbg 73 admit) vs cardiac etiology; low suspicion for sepsis (WBC wnl; CXR neg; Lactic acid wnl; UA clean).  Hypothermia resolved quickly in ED with bear hugger. EKG: NSR w/o acute changes.  - Admit to stepdown; telemetry monitoring - Check orthostatic: Given 3-4 loose BMs daily x 1 week - cycle troponins; repeat EKG in am, and ordered ECHO - FOBT: ordered - Check TSH  # Cardiac: Hx of HF & HTN. Recent history of "fluttering" and worsening dyspnea concerning for Afib +/- worsening HF; EKG = Sinus bradycardia. Intermittent mildly elevated BPs. No ECHO in EPIC - Likely would benefit from ACE-I given DM2 and HF however will await ECHO results and tele monitoring to assess for Afib - Repeat EKG  - ECHO: Ordered - TSH: Ordered  - ASA 81mg : Started - Cheching Lipids: Likely would benefit from Statin  # Diabetes: A1c = 8.4 2/13;  - A1c: ordered - Continue home lantus: 8 units - SSI thin AC w/o HS  # Blurry vision:  Several month history of blurry vision. Left eye conjunctivitis. FHx of glaucoma. Visual fields full to confrontation - Concern for glaucoma; consult ophthalmology  # Hx of alcoholism w/ multiple bouts of pancreatitis.  Lipase 14. No current abdominal pain.  Denies alcohol use and past month. Ethanol Negative.  - CIWA  # Loose stools: Partially due to viral gastritis versus pancreatic insufficiency - Cdiff: pending  # Hematuria:  Currently asymptomatic.  No history of kidney stones; However UA positive for CA oxalate crystals and annular casts - Recommend CT abdomeniIf develops back or abdominal pain - Recommend follow-up UA in 1-2 weeks  # Penile ulcers - HSV pcr: ordered - HSV antibodies: ordered - HIV: pending  FEN/GI: Carb Mod diet; SLIV Prophylaxis: Subq Heparin  Disposition: Stepdown due to hypothermia   History of Present Illness: Melvin [name] is a 54 y.o. male presenting with weakness and feeling like he was going to pass out.  He reports feeling "hot" for the past week, as well as having 3-4 BMs daily for the past week.  He did not check his temperature, and denies any chills, N/V, coughing, dysuria.  He denies any melena or hematochezia.  Reports BMs were loose and watery.  He has been eating and drinking well.  He took 8 units of Lantus this morning, then possibly one hour later, woke up with blurry vision, sweating and numbness and pain in bilateral hands and feet that he reports is similar to his "neuropathy".  He reports feeling weak, which prompted his ED visit.  He reports brief episode of sharp chest pain with "fluttering" that resolved after a few minutes, and similar episodes of fluttering over the past year.  He reports being diagnosed with heart failure several years ago, and endorses dyspnea with minimal exertion over the past month and lower extremity heaviness.  He also reports 2 episodes of "diabetic comas" lasting approximately week 4-5 years ago.  He has been out of medications except insulin for the  last 2-3 years due to disability, which he is currently applying for.  He also complains of several ulcers on his penis that have been intermittently present for the last several months; denies having sex in the last 3 years.   Review Of Systems: Per HPI with the following additions:  Otherwise 12 point review of systems was performed and was unremarkable.  Patient Active Problem List    Diagnosis Date Noted  . Hypothermia 10/24/2014  . ETOH abuse 07/28/2011  . Pulmonary nodule 05/30/2011  . Pancreatitis 05/28/2011  . Diabetes mellitus 05/28/2011   Past Medical History: Past Medical History  Diagnosis Date  . Diabetes mellitus   . Meningitis   . Pneumonia   . GERD (gastroesophageal reflux disease)   . Pancreatitis   . Angina   . CHF (congestive heart failure)   . Hypertension    Past Surgical History: Past Surgical History  Procedure Laterality Date  . Tonsillectomy and adenoidectomy      "as a kid"  . Ercp  08/02/2011    Procedure: ENDOSCOPIC RETROGRADE CHOLANGIOPANCREATOGRAPHY (ERCP);  Surgeon: Beryle Beams, MD;  Location: [facility] ENDOSCOPY;  Service: Endoscopy;  Laterality: N/A;  . Ercp  08/22/2011    Procedure: ENDOSCOPIC RETROGRADE CHOLANGIOPANCREATOGRAPHY (ERCP);  Surgeon: Beryle Beams, MD;  Location: [facility] ENDOSCOPY;  Service: Endoscopy;  Laterality: N/A;  . Tympanostomy tube placement     Social History: History  Substance Use Topics  . Smoking status: Never Smoker   . Smokeless tobacco: Never Used  . Alcohol Use: No     Comment: Past daily use   Additional social history: Previous alcoholism. Denies use in last month  Please also refer to relevant sections of EMR.  Family History: Family History  Problem Relation Age of Onset  . Heart failure Mother    Allergies and Medications: No Known Allergies No current facility-administered medications on file prior to encounter.   Current Outpatient Prescriptions on File Prior to Encounter  Medication Sig Dispense Refill  . insulin glargine (LANTUS) 100 UNIT/ML injection Inject 8 Units into the skin daily.     . insulin NPH-insulin regular (HUMULIN 70/30 PEN) (70-30) 100 UNIT/ML injection Take 26 units in the morning and 14 units in the evening before a meal. 15 mL 12  . folic acid (FOLVITE) 1 MG tablet Take 1 tablet (1 mg total) by mouth daily. (Patient not taking: Reported on 10/24/2014) 30 tablet 12   . furosemide (LASIX) 40 MG tablet Take 1 tablet (40 mg total) by mouth daily. 30 tablet 0  . furosemide (LASIX) 40 MG tablet Take 1 tablet (40 mg total) by mouth daily. (Patient not taking: Reported on 10/24/2014) 30 tablet 2  . hydrALAZINE (APRESOLINE) 10 MG tablet Take 2.5 tablets (25 mg total) by mouth 2 (two) times daily. 60 tablet 0  . insulin glulisine (APIDRA OPTICLIK) 100 UNIT/ML injection Inject 2 Units into the skin 3 (three) times daily before meals. Check Blood glucose three times daily 0-150 - no units 151-200 2 units 201-250 4 units 251-300 6 units 301-350 8 units 351-400 10 units. > 400 give 12 units and call MD 1 cartridge 12  . octreotide (SANDOSTATIN) 100 MCG/ML SOLN Inject 2 mLs (200 mcg total) into the skin every 12 (twelve) hours. (Patient not taking: Reported on 10/24/2014) 90 mL 1  . pantoprazole (PROTONIX) 40 MG tablet Take 1 tablet (40 mg total) by mouth daily. 30 tablet 0  . spironolactone (ALDACTONE) 100 MG tablet Take 1 tablet (100 mg  total) by mouth daily. 30 tablet 0  . spironolactone (ALDACTONE) 100 MG tablet Take 1 tablet (100 mg total) by mouth daily. (Patient not taking: Reported on 10/24/2014) 30 tablet 2  . thiamine 100 MG tablet Take 1 tablet (100 mg total) by mouth daily. (Patient not taking: Reported on 10/24/2014) 30 tablet 12    Objective: BP 128/85 mmHg  Pulse 67  Temp(Src) 96 F (35.6 C) (Rectal)  Resp 15  SpO2 100% Exam: General: Lying in bed; NAD with bear hugger on Eyes: Left eye erythema with mild tenderness; PERRLA; EOMI w/o pain ENTM: MMM Neck: Thyroid nonpalpable; no adenopathy Cardiovascular: RRR no m/r/g; radial and DP pulses 2+ bilaterally Respiratory: CTAB; normal effort Abdomen: SNTND; BS +; no hepatojugular reflex GU: Two ~ 3mm penile ulcers Skin: no rash Neuro: A&Ox3. Gross sensory and motor intact.  Bilateral ankle clonus 3-4 beats Psych: Normal affect and attention  Labs and Imaging: Results for orders placed or performed  during the hospital encounter of 10/24/14 (from the past 24 hour(s))  POC CBG, ED     Status: None   Collection Time: 10/24/14 11:02 AM  Result Value Ref Range   Glucose-Capillary 73 65 - 99 mg/dL  CBC with Differential     Status: None   Collection Time: 10/24/14 11:34 AM  Result Value Ref Range   WBC 4.7 4.0 - 10.5 K/uL   RBC 4.40 4.22 - 5.81 MIL/uL   Hemoglobin 14.2 13.0 - 17.0 g/dL   HCT 40.0 39.0 - 52.0 %   MCV 90.9 78.0 - 100.0 fL   MCH 32.3 26.0 - 34.0 pg   MCHC 35.5 30.0 - 36.0 g/dL   RDW 13.3 11.5 - 15.5 %   Platelets 197 150 - 400 K/uL   Neutrophils Relative % 65 43 - 77 %   Neutro Abs 3.0 1.7 - 7.7 K/uL   Lymphocytes Relative 24 12 - 46 %   Lymphs Abs 1.1 0.7 - 4.0 K/uL   Monocytes Relative 10 3 - 12 %   Monocytes Absolute 0.5 0.1 - 1.0 K/uL   Eosinophils Relative 1 0 - 5 %   Eosinophils Absolute 0.1 0.0 - 0.7 K/uL   Basophils Relative 0 0 - 1 %   Basophils Absolute 0.0 0.0 - 0.1 K/uL  Comprehensive metabolic panel     Status: Abnormal   Collection Time: 10/24/14 11:34 AM  Result Value Ref Range   Sodium 137 135 - 145 mmol/L   Potassium 2.9 (L) 3.5 - 5.1 mmol/L   Chloride 100 (L) 101 - 111 mmol/L   CO2 26 22 - 32 mmol/L   Glucose, Bld 110 (H) 65 - 99 mg/dL   BUN 8 6 - 20 mg/dL   Creatinine, Ser 0.77 0.61 - 1.24 mg/dL   Calcium 9.2 8.9 - 10.3 mg/dL   Total Protein 7.6 6.5 - 8.1 g/dL   Albumin 3.7 3.5 - 5.0 g/dL   AST 32 15 - 41 U/L   ALT 19 17 - 63 U/L   Alkaline Phosphatase 77 38 - 126 U/L   Total Bilirubin 1.4 (H) 0.3 - 1.2 mg/dL   GFR calc non Af Amer >60 >60 mL/min   GFR calc Af Amer >60 >60 mL/min   Anion gap 11 5 - 15  Lipase, blood     Status: Abnormal   Collection Time: 10/24/14 11:34 AM  Result Value Ref Range   Lipase 14 (L) 22 - 51 U/L  I-Stat CG4 Lactic Acid, ED  Status: None   Collection Time: 10/24/14 11:39 AM  Result Value Ref Range   Lactic Acid, Venous 1.92 0.5 - 2.0 mmol/L  POC occult blood, ED RN will collect     Status: None    Collection Time: 10/24/14 11:42 AM  Result Value Ref Range   Fecal Occult Bld NEGATIVE NEGATIVE  Urinalysis, Routine w reflex microscopic     Status: Abnormal   Collection Time: 10/24/14 12:15 PM  Result Value Ref Range   Color, Urine AMBER (A) YELLOW   APPearance HAZY (A) CLEAR   Specific Gravity, Urine 1.023 1.005 - 1.030   pH 5.0 5.0 - 8.0   Glucose, UA 500 (A) NEGATIVE mg/dL   Hgb urine dipstick MODERATE (A) NEGATIVE   Bilirubin Urine SMALL (A) NEGATIVE   Ketones, ur 15 (A) NEGATIVE mg/dL   Protein, ur 30 (A) NEGATIVE mg/dL   Urobilinogen, UA 1.0 0.0 - 1.0 mg/dL   Nitrite NEGATIVE NEGATIVE   Leukocytes, UA NEGATIVE NEGATIVE  Urine microscopic-add on     Status: Abnormal   Collection Time: 10/24/14 12:15 PM  Result Value Ref Range   Squamous Epithelial / LPF RARE RARE   WBC, UA 0-2 <3 WBC/hpf   RBC / HPF 7-10 <3 RBC/hpf   Bacteria, UA RARE RARE   Casts GRANULAR CAST (A) NEGATIVE   Crystals CA OXALATE CRYSTALS (A) NEGATIVE   Urine-Other MUCOUS PRESENT   I-Stat Troponin, ED (not at [facility])     Status: None   Collection Time: 10/24/14 12:35 PM  Result Value Ref Range   Troponin i, poc 0.00 0.00 - 0.08 ng/mL   Comment 3          Ethanol     Status: None   Collection Time: 10/24/14  2:30 PM  Result Value Ref Range   Alcohol, Ethyl (B) <5 <5 mg/dL  I-Stat CG4 Lactic Acid, ED     Status: None   Collection Time: 10/24/14  2:50 PM  Result Value Ref Range   Lactic Acid, Venous 1.01 0.5 - 2.0 mmol/L  Glucose, capillary     Status: Abnormal   Collection Time: 10/24/14  3:05 PM  Result Value Ref Range   Glucose-Capillary 107 (H) 65 - 99 mg/dL  Urine rapid drug screen (hosp performed)     Status: Abnormal   Collection Time: 10/24/14  4:15 PM  Result Value Ref Range   Opiates NONE DETECTED NONE DETECTED   Cocaine POSITIVE (A) NONE DETECTED   Benzodiazepines NONE DETECTED NONE DETECTED   Amphetamines NONE DETECTED NONE DETECTED   Tetrahydrocannabinol NONE DETECTED NONE DETECTED    Barbiturates NONE DETECTED NONE DETECTED  Clostridium Difficile by PCR     Status: None   Collection Time: 10/24/14  4:20 PM  Result Value Ref Range   C difficile by pcr NEGATIVE NEGATIVE  Occult blood card to lab, stool     Status: None   Collection Time: 10/24/14  4:44 PM  Result Value Ref Range   Fecal Occult Bld NEGATIVE NEGATIVE   Olam Idler, MD 10/24/2014, 2:07 PM PGY-2, [place] pager: [phone], text pages welcome

## 2014-10-25 ENCOUNTER — Inpatient Hospital Stay (HOSPITAL_COMMUNITY): Payer: Medicaid Other

## 2014-10-25 DIAGNOSIS — R55 Syncope and collapse: Secondary | ICD-10-CM

## 2014-10-25 DIAGNOSIS — E119 Type 2 diabetes mellitus without complications: Secondary | ICD-10-CM | POA: Insufficient documentation

## 2014-10-25 DIAGNOSIS — H538 Other visual disturbances: Secondary | ICD-10-CM | POA: Insufficient documentation

## 2014-10-25 DIAGNOSIS — F1410 Cocaine abuse, uncomplicated: Secondary | ICD-10-CM

## 2014-10-25 DIAGNOSIS — B009 Herpesviral infection, unspecified: Secondary | ICD-10-CM

## 2014-10-25 DIAGNOSIS — R531 Weakness: Secondary | ICD-10-CM

## 2014-10-25 DIAGNOSIS — R0600 Dyspnea, unspecified: Secondary | ICD-10-CM

## 2014-10-25 DIAGNOSIS — I509 Heart failure, unspecified: Secondary | ICD-10-CM | POA: Insufficient documentation

## 2014-10-25 DIAGNOSIS — E11649 Type 2 diabetes mellitus with hypoglycemia without coma: Secondary | ICD-10-CM | POA: Insufficient documentation

## 2014-10-25 DIAGNOSIS — T68XXXA Hypothermia, initial encounter: Secondary | ICD-10-CM

## 2014-10-25 DIAGNOSIS — F1020 Alcohol dependence, uncomplicated: Secondary | ICD-10-CM | POA: Insufficient documentation

## 2014-10-25 DIAGNOSIS — I10 Essential (primary) hypertension: Secondary | ICD-10-CM | POA: Insufficient documentation

## 2014-10-25 LAB — HEMOGLOBIN A1C
Hgb A1c MFr Bld: 11.1 % — ABNORMAL HIGH (ref 4.8–5.6)
Mean Plasma Glucose: 272 mg/dL

## 2014-10-25 LAB — URINE CULTURE
Colony Count: NO GROWTH
Culture: NO GROWTH

## 2014-10-25 LAB — CBC
HEMATOCRIT: 33.8 % — AB (ref 39.0–52.0)
Hemoglobin: 11.6 g/dL — ABNORMAL LOW (ref 13.0–17.0)
MCH: 31.2 pg (ref 26.0–34.0)
MCHC: 34.3 g/dL (ref 30.0–36.0)
MCV: 90.9 fL (ref 78.0–100.0)
PLATELETS: 189 10*3/uL (ref 150–400)
RBC: 3.72 MIL/uL — AB (ref 4.22–5.81)
RDW: 13.6 % (ref 11.5–15.5)
WBC: 4.7 10*3/uL (ref 4.0–10.5)

## 2014-10-25 LAB — BASIC METABOLIC PANEL
ANION GAP: 7 (ref 5–15)
BUN: 10 mg/dL (ref 6–20)
CALCIUM: 8.3 mg/dL — AB (ref 8.9–10.3)
CO2: 24 mmol/L (ref 22–32)
CREATININE: 0.71 mg/dL (ref 0.61–1.24)
Chloride: 103 mmol/L (ref 101–111)
GFR calc non Af Amer: >60 mL/min (ref >60)
Glucose, Bld: 106 mg/dL — ABNORMAL HIGH (ref 65–99)
Potassium: 3.4 mmol/L — ABNORMAL LOW (ref 3.5–5.1)
SODIUM: 134 mmol/L — AB (ref 135–145)

## 2014-10-25 LAB — LIPID PANEL
CHOL/HDL RATIO: 1.7 ratio
CHOLESTEROL: 163 mg/dL (ref 0–200)
HDL: 95 mg/dL (ref >40)
LDL Cholesterol: 55 mg/dL (ref 0–99)
Triglycerides: 64 mg/dL (ref <150)
VLDL: 13 mg/dL (ref 0–40)

## 2014-10-25 LAB — HSV(HERPES SIMPLEX VRS) I + II AB-IGG
HSV 1 GLYCOPROTEIN G AB, IGG: 14.5 {index} — AB (ref 0.00–0.90)
HSV 2 Glycoprotein G Ab, IgG: 5.59 index — ABNORMAL HIGH (ref 0.00–0.90)

## 2014-10-25 LAB — HIV ANTIBODY (ROUTINE TESTING W REFLEX): HIV SCREEN 4TH GENERATION: NONREACTIVE

## 2014-10-25 LAB — GLUCOSE, CAPILLARY
GLUCOSE-CAPILLARY: 223 mg/dL — AB (ref 65–99)
GLUCOSE-CAPILLARY: 215 mg/dL — AB (ref 65–99)
Glucose-Capillary: 108 mg/dL — ABNORMAL HIGH (ref 65–99)
Glucose-Capillary: 120 mg/dL — ABNORMAL HIGH (ref 65–99)
Glucose-Capillary: 299 mg/dL — ABNORMAL HIGH (ref 65–99)

## 2014-10-25 MED ORDER — ACYCLOVIR 400 MG PO TABS
400.0000 mg | ORAL_TABLET | Freq: Three times a day (TID) | ORAL | Status: DC
  Administered 2014-10-25 – 2014-10-26 (×3): 400 mg via ORAL
  Filled 2014-10-25 (×6): qty 1

## 2014-10-25 NOTE — Progress Notes (Signed)
Pt's PCP has been Dr Iona Beard and he directed pt to use the [facility].  Provided contact information - pt will call for an appointment and can fill scripts @ the clinic pharmacy after discharge.  ED CM provided pt with contact information to start application for orange card.

## 2014-10-25 NOTE — Discharge Summary (Signed)
[facility] Discharge Summary  Patient name: Melvin [name] Medical record number: [id number] Date of birth: [birth year] Age: 54 y.o. Gender: male Date of Admission: 10/24/2014  Date of Discharge: 10/25/2014 Admitting Physician: Kinnie Feil, MD  Primary Care Provider: Maggie Font, MD Consultants: None  Indication for Hospitalization: Hypoglycemia, Pre-Syncope  Discharge Diagnoses/Problem List:  DMII - poorly controlled CHF HTN Pancreatitis Angina HSV 1,2 Blurry Vision Alcoholism Cocaine Abuse  Chronic Diarrhea (loose stools) Hematuria  Disposition: Home  Discharge Condition: Stable.   Discharge Exam:  General: NAD, AAOx3 Cardiovascular: RRR, No MGR, Normal S1/S2, 2+ distal pulses Respiratory: CTA Bilaterally, Appropriate rate, unlabored.  Abdomen: S, NT, ND, +BS, No organomegaly.  Extremities: WWP, 2+ distal pulses, no edema.   Brief Hospital Course:  Melvin [name] is a 54 y.o. male presenting with presyncope and hypothermia. PMH is significant for DM2; CHF, HTN, Pancreatitis (multiple bouts 2/2 alcohol abuse), Cocaine Abuse, Reported Chronic Diarrhea.   Presyncope & Hypothermia / Cardiac : Patient was initially seen in the ED after an episode of nearly passing out at home, and having reported hypoglycemia that was unconfirmed. IN the Ed, his temp was noted to be 96 F. Additionally, Glucose was 77 upon arrival. He had a cardiac history though no symptoms typical for ACS. Etiology was thought to be due [possibly to cocaine (UDS + on admit) vs hypoglycemia (cbg 73 admit) vs cardiac etiology; his lactic acid was normal. Hypothermia resolved quickly in ED with bear hugger. EKG: NSR w/o acute changes. Troponins were normal overnight. An echocardiogram revealed mostly normal cardiac function with grade I diastolic dysfunction. He had no events on telemetry. He will need an outpatient cardiac referral for stress testing / risk  stratification. His ASCVD risk is well above 18% despite his excellent cholesterol panel. Additionally, he has a strong family history with his father passing away at 37 from an MI. Needs to be on ASA and Moderate intensity statin for primary prevention. Needs risk factor modification.   Diabetes II: Pt. Has uncontrolled DMII with A1C of 11.1 here. He has been on Lantus at home 8 units. He has not taken any other medications due to financial constraints. He will need to follow up with a primary care provider for ongoing DMII management. He was started on Metformin in addition to his Lantus here.   Blurry Vision: Pt. Complained of this on admission, though it quickly resolved, and was thought to be due to factors surrounding his initial presentation. However, he does have a family history of glaucoma and he reports never having a screening eye exam from an Ophthalmologist for Diabetic Retinopathy. Will need outpatient follow up with Ophthalmology.   Hematuria : noted on Urinalysis here. Additionally, Calcium oxalate crystals were noted in his urine. He has had no abdominal pain or symptoms here. We recommended po hydration to him. He will need outpatient follow up and repeat urinalysis in a few weeks for resolution, otherwise his hematuria may require further investigation given his age and risk factors.   Penile Ulcers :  Pt. Complaining of ulcerations on his penis. HIV and RPR both negative, but HSV 1, 2 antibodies both positive. We started him on Acyclovir here for reduced length of symptomatic phase. He will continue treatment for 10 days.   Hypertension : Pt. With a history of hypertension and has been on Hydralazine, Aldactone, and Lasix in the past. His pressures have been mostly normal during this hospitalization with little intervention. Given his DMII,  and concern for CAD, we placed him on a low dose ACE inhibitor prior to discharge and discontinued his home regimen.      Issues for Follow Up:   1. Needs PCP follow up for risk factor reduction.  2. Needs Cardiology referral for stress testing / risk stratification. Started on ASA, Low dose Lisinopril, Medium intensity Statin therapy given ASCVD risk 18% 3. Needs follow up for DMII glucose control. Continued on Lantus, Metformin 500 BID added at this hospitalization.  4. Needs an ophthalmology referral and evaluation / screening.  5. Needs a repeat Urinalysis due to gross hematuria in 2-3 weeks.  6. Follow up symptomatic improvement of penile ulcers (HSV) with Acyclovir course.  7. Adjust Lisinopril as needed for optimal blood pressure control.   Significant Procedures: None  Significant Labs and Imaging:   Recent Labs Lab 10/24/14 1134 10/25/14 0250  WBC 4.7 4.7  HGB 14.2 11.6*  HCT 40.0 33.8*  PLT 197 189    Recent Labs Lab 10/24/14 1134 10/24/14 1646 10/25/14 0250  NA 137  --  134*  K 2.9*  --  3.4*  CL 100*  --  103  CO2 26  --  24  GLUCOSE 110*  --  106*  BUN 8  --  10  CREATININE 0.77  --  0.71  CALCIUM 9.2  --  8.3*  MG  --  1.4*  --   ALKPHOS 77  --   --   AST 32  --   --   ALT 19  --   --   ALBUMIN 3.7  --   --    Lactic Acid - 1.9  HSV 1 ab - 14.5 HSV 2 ab - 5.59 HIV Negative RPR - Negative.   Hgb A1C - 11.1 TSH - 0.733 PT 14.4 INR - 1.11  FOBT - Negative C-Diff - Negative.   UDS - positive for Cocaine.   Lipids:  Cholesterol - 163 HDL - 95 LDL - 55 Triglycerides - 64 VLDL - 13   Echocardiogram:  Study Conclusions  - Left ventricle: The cavity size was normal. Wall thickness was increased in a pattern of mild LVH. Systolic function was vigorous. The estimated ejection fraction was in the range of 65% to 70%. Wall motion was normal; there were no regional wall motion abnormalities. Doppler parameters are consistent with abnormal left ventricular relaxation (grade 1 diastolic dysfunction).  Results/Tests Pending at Time of Discharge: None  Discharge  Medications:    Medication List    STOP taking these medications        furosemide 40 MG tablet  Commonly known as:  LASIX     hydrALAZINE 10 MG tablet  Commonly known as:  APRESOLINE     insulin glulisine 100 UNIT/ML injection  Commonly known as:  APIDRA OPTICLIK     pantoprazole 40 MG tablet  Commonly known as:  PROTONIX     spironolactone 100 MG tablet  Commonly known as:  ALDACTONE      TAKE these medications        acyclovir 400 MG tablet  Commonly known as:  ZOVIRAX  Take 1 tablet (400 mg total) by mouth 3 (three) times daily.     aspirin 81 MG EC tablet  Take 1 tablet (81 mg total) by mouth daily.     atorvastatin 10 MG tablet  Commonly known as:  LIPITOR  Take 1 tablet (10 mg total) by mouth daily at 6 PM.  insulin glargine 100 UNIT/ML injection  Commonly known as:  LANTUS  Inject 8 Units into the skin daily.     lisinopril 2.5 MG tablet  Commonly known as:  PRINIVIL,ZESTRIL  Take 1 tablet (2.5 mg total) by mouth daily.     metFORMIN 500 MG tablet  Commonly known as:  GLUCOPHAGE  Take 1 tablet (500 mg total) by mouth 2 (two) times daily with a meal.        Discharge Instructions: Please refer to Patient Instructions section of EMR for full details.  Patient was counseled important signs and symptoms that should prompt return to medical care, changes in medications, dietary instructions, activity restrictions, and follow up appointments.   Follow-Up Appointments: Follow-up Information    Follow up with [facility]    .   Why:  Appointment on Tuesday, 10/31/14 @ 11:00 a.m.   Contact information:   [place] [id number] [phone]      Aquilla Hacker, MD 10/26/2014, 11:42 AM PGY-1, [place]

## 2014-10-25 NOTE — Progress Notes (Signed)
  Echocardiogram 2D Echocardiogram has been performed.  Diamond Nickel 10/25/2014, 2:48 PM

## 2014-10-25 NOTE — Progress Notes (Signed)
Utilization Review Completed.  

## 2014-10-25 NOTE — Progress Notes (Signed)
Family Medicine Teaching Service Daily Progress Note Intern Pager: [phone]  Patient name: Melvin [name] Medical record number: [id number] Date of birth: 1961-05-13 Age: 54 y.o. Gender: male  Primary Care Provider: Maggie Font, MD Consultants: None Code Status: Full  Pt Overview and Major Events to Date:  5/17-5/18: Admitted for observation due to hypothermia in the setting of likely hypoglycemia.   Assessment and Plan: Melvin [name] is a 54 y.o. male presenting with presyncope and hypothermia. PMH is significant for DM2; HF; HTN  # Presyncope & Hypothermia: Possibly due to cocaine (UDS + on admit) vs hypoglycemia (cbg 73 admit) vs cardiac etiology; low suspicion for sepsis (WBC wnl; CXR neg; Lactic acid wnl; UA clean). Hypothermia resolved quickly in ED with bear hugger. EKG: NSR w/o acute changes. Overnight with stable vital signs and CBG in normal range.  - telemetry monitoring.  - Vitals stable. Episode resolved. Multiple potential etiologies.  - Needs outpatient cardiac referral and stress testing for potential cardiac etiology.  - Troponins negative. Echo pending - Repeat EKG with inverted T-wave in V2. New from yesterday in the setting of negative troponins.  - FOBT negative.  - TSH normal.   # Cardiac: Hx of HF & HTN. Recent history of "fluttering" and worsening dyspnea concerning for Afib +/- worsening HF; EKG = Sinus bradycardia. Intermittent mildly elevated BPs. No ECHO in EPIC. Additionally, pt. With history of angina with exertion relieved by rest over the past 6 months. Has not had any of that during this admission. Family history of father passing away at 44 from an MI. ASCVD Risk 18%.  - ECHO: pending - TSH: Normal.  - Lipid panel Chol - 163, HDL - 95.  - ASA 81mg : Started - ASCVD risk elevated. Would likely benefit from statin regardless of  lipid panel for primary prevention.  - Pending echo, needs an outpatient cardiology referral for stress testing /  risk stratification.  - No events on tele here though he is reporting palpitations.   # Diabetes: A1c = 8.4 2/13. CBG here 100's - 223 - A1c 11.1 - Only on Lantus at home mostly due to affordability of meds. 8 units at home.  - Will start Metformin here.  - Will go home on half of his home lantus dose to avoid hoypoglycemia.  - He needs PCP follow up for ongoing DMII management and medical care.  - SSI thin AC w/o HS here.   # Blurry vision: Several month history of blurry vision.. FHx of glaucoma. Visual fields full to confrontation. No deficits. He has no pain today, and his conjunctival injection is resolved. He has never seen an ophthalmologist for retinal screening with his history of DMII or for any other reason.  - Concern for glaucoma potentially.  - Needs an outpatient referral and evaluation by ophthalmology  - No visual deficits.   # Hx of alcoholism w/ multiple bouts of pancreatitis.Cocaine Abuse. Lipase 14. No current abdominal pain. Denies alcohol use and past month. Ethanol Negative. UDS positive for cocaine.  - CIWA - Pt. Also endorses loose stools and progressive weight loss over the past ten years.  - Needs to follow up with PCP with regard to nutritional status and potential pancreatic insufficiency / colon cancer screening.   # Loose stools: Partially due to viral enteritis versus pancreatic insufficiency - Cdiff: negative.  - FOBT negative.  - Needs further evaluation for colon cancer screening / pancreatic insufficiency as an outpatient.   # Hematuria: Currently asymptomatic. No  history of kidney stones; However UA positive for CA oxalate crystals and annular casts - Recommend CT abdomeniIf develops back or abdominal pain - Recommend follow-up UA in 1-2 weeks  # Penile ulcers - HSV pcr / Antibodies positive for HSV 1,2.  - HIV: Negative.  - RPR: pending.  - Will start Acyclovir for HSV symptoms.   FEN/GI: Carb Mod diet; SLIV Prophylaxis: Subq  Heparin  Disposition: Home today after Echocardiogram if he continues to do well.   Subjective:  No acute events overnight. Pt. Has not had any further episodes of hypoglycemia. He feels much better. He has no complaints this am. He says that he did have episodes of palpitations overnight. He has not had any more dizziness / lightheadedness. His eye pain / injection has resolved. He says that he has not seen his PCP in some time due to financial constraints. He also has not been able to get his medications due to financial issues and he has only been able to take his Lantus. He says that he would like to get established at [facility] if possible for ongoing primary medical  care.   Objective: Temp:  [96 F (35.6 C)-98.6 F (37 C)] 98.6 F (37 C) (05/18 0804) Pulse Rate:  [55-92] 83 (05/18 0804) Resp:  [13-25] 14 (05/18 0804) BP: (105-152)/(73-100) 117/75 mmHg (05/18 0804) SpO2:  [100 %] 100 % (05/18 0804) Weight:  [147 lb 3.2 oz (66.769 kg)-147 lb 14.9 oz (67.1 kg)] 147 lb 3.2 oz (66.769 kg) (05/18 0443) Physical Exam: General: NAD, AAOx3 Cardiovascular: RRR, No MGR, Normal S1/S2, 2+ distal pulses Respiratory: CTA Bilaterally, Appropriate rate, unlabored.  Abdomen: S, NT, ND, +BS, No organomegaly.  Extremities: WWP, 2+ distal pulses, no edema.   Laboratory:  Recent Labs Lab 10/24/14 1134 10/25/14 0250  WBC 4.7 4.7  HGB 14.2 11.6*  HCT 40.0 33.8*  PLT 197 189    Recent Labs Lab 10/24/14 1134 10/25/14 0250  NA 137 134*  K 2.9* 3.4*  CL 100* 103  CO2 26 24  BUN 8 10  CREATININE 0.77 0.71  CALCIUM 9.2 8.3*  PROT 7.6  --   BILITOT 1.4*  --   ALKPHOS 77  --   ALT 19  --   AST 32  --   GLUCOSE 110* 106*   Lactic Acid - 1.9  HSV 1 ab - 14.5 HSV 2 ab - 5.59 HIV Negative  Hgb A1C - 11.1 TSH - 0.733 PT 14.4 INR - 1.11  FOBT - Negative C-Diff - Negative.   Lipid Panel     Component Value Date/Time   CHOL 163 10/25/2014 0250   TRIG 64  10/25/2014 0250   HDL 95 10/25/2014 0250   CHOLHDL 1.7 10/25/2014 0250   VLDL 13 10/25/2014 0250   LDLCALC 55 10/25/2014 0250   Drugs of Abuse     Component Value Date/Time   LABOPIA NONE DETECTED 10/24/2014 1615   COCAINSCRNUR POSITIVE* 10/24/2014 1615   LABBENZ NONE DETECTED 10/24/2014 1615   AMPHETMU NONE DETECTED 10/24/2014 1615   THCU NONE DETECTED 10/24/2014 1615   LABBARB NONE DETECTED 10/24/2014 1615    Urinalysis    Component Value Date/Time   COLORURINE AMBER* 10/24/2014 1215   APPEARANCEUR HAZY* 10/24/2014 1215   LABSPEC 1.023 10/24/2014 1215   PHURINE 5.0 10/24/2014 1215   GLUCOSEU 500* 10/24/2014 1215   HGBUR MODERATE* 10/24/2014 1215   BILIRUBINUR SMALL* 10/24/2014 1215   KETONESUR 15* 10/24/2014 1215   PROTEINUR 30*  10/24/2014 1215   UROBILINOGEN 1.0 10/24/2014 1215   NITRITE NEGATIVE 10/24/2014 1215   LEUKOCYTESUR NEGATIVE 10/24/2014 1215   EKG - sinus bradycardia, repeat EKG with inverted T-wave in V2.   CXR 5/17:  EXAM: PORTABLE CHEST - 1 VIEW  COMPARISON: 07/29/2008  FINDINGS: Improved aeration of the left lung since prior radiograph was some scarring present in the left lung. Mild atelectasis/scarring present at both lung bases. There is no evidence of pulmonary edema, consolidation, pneumothorax, nodule or pleural fluid. The heart size is normal.  IMPRESSION: No acute findings. Bilateral pulmonary scarring.   Electronically Signed  By: Aletta Edouard M.D.  On: 10/24/2014 12:08   Aquilla Hacker, MD 10/25/2014, 9:24 AM PGY-1, [place] Intern pager: [phone], text pages welcome

## 2014-10-26 DIAGNOSIS — T68XXXD Hypothermia, subsequent encounter: Secondary | ICD-10-CM

## 2014-10-26 LAB — HERPES SIMPLEX VIRUS(HSV) DNA BY PCR
HSV 1 DNA: NEGATIVE
HSV 2 DNA: NEGATIVE

## 2014-10-26 LAB — GLUCOSE, CAPILLARY
GLUCOSE-CAPILLARY: 251 mg/dL — AB (ref 65–99)
GLUCOSE-CAPILLARY: 219 mg/dL — AB (ref 65–99)

## 2014-10-26 LAB — RPR: RPR: NONREACTIVE

## 2014-10-26 MED ORDER — ASPIRIN 81 MG PO TBEC: 81.0000 mg | DELAYED_RELEASE_TABLET | Freq: Every day | ORAL | Status: DC

## 2014-10-26 MED ORDER — ACYCLOVIR 400 MG PO TABS: 400.0000 mg | ORAL_TABLET | Freq: Three times a day (TID) | ORAL | Status: DC

## 2014-10-26 MED ORDER — ATORVASTATIN CALCIUM 10 MG PO TABS: 10.0000 mg | ORAL_TABLET | Freq: Every day | ORAL | Status: DC

## 2014-10-26 MED ORDER — LISINOPRIL 2.5 MG PO TABS: 2.5000 mg | ORAL_TABLET | Freq: Every day | ORAL | Status: DC

## 2014-10-26 MED ORDER — ATORVASTATIN CALCIUM 10 MG PO TABS
10.0000 mg | ORAL_TABLET | Freq: Every day | ORAL | Status: DC
  Filled 2014-10-26: qty 1

## 2014-10-26 MED ORDER — ATORVASTATIN CALCIUM 20 MG PO TABS: 20.0000 mg | ORAL_TABLET | Freq: Every day | ORAL | Status: DC

## 2014-10-26 MED ORDER — INSULIN GLARGINE 100 UNIT/ML ~~LOC~~ SOLN: 8.0000 [IU] | Freq: Every day | SUBCUTANEOUS | Status: DC

## 2014-10-26 MED ORDER — METFORMIN HCL 500 MG PO TABS: 500.0000 mg | ORAL_TABLET | Freq: Two times a day (BID) | ORAL | Status: DC

## 2014-10-26 MED ORDER — LISINOPRIL 2.5 MG PO TABS
2.5000 mg | ORAL_TABLET | Freq: Every day | ORAL | Status: DC
  Administered 2014-10-26: 2.5 mg via ORAL
  Filled 2014-10-26: qty 1

## 2014-10-26 MED ORDER — METFORMIN HCL 500 MG PO TABS
500.0000 mg | ORAL_TABLET | Freq: Two times a day (BID) | ORAL | Status: DC
  Administered 2014-10-26: 500 mg via ORAL
  Filled 2014-10-26 (×3): qty 1

## 2014-10-26 NOTE — Progress Notes (Signed)
Pt has an appointment @ [facility] [place] for Tuesday, May 24th @ 11:00 a.m.

## 2014-10-26 NOTE — Progress Notes (Signed)
Results for ERIKA, HUSSAR (MRN [id number]) as of 10/26/2014 14:02  Ref. Range 10/25/2014 12:48 10/25/2014 16:57 10/25/2014 21:54 10/26/2014 08:04 10/26/2014 11:54  Glucose-Capillary Latest Ref Range: 65-99 mg/dL 223 (H) 215 (H) 299 (H) 251 (H) 219 (H)  CBGs continue to be greater than 180 mg/dl. Recommend increasing Novolog correction scale to MODERATE or RESISTANT every 4 hours if NPO and TID & HS if eating. Continue Lantus 8 units daily. May need to increase Lantus if CBGs continue to be greater than 180 mg/dl. Will continue to follow while in hospital. Harvel Ricks RN BSN CDE

## 2014-10-26 NOTE — Discharge Instructions (Signed)
We are glad that you are feeling better.   You have been started on Aspirin, Lisinopril, Atorvastatin, and Metformin. It is important that you take each of these medications daily as prescribed.   It is VERY important that you follow up with your appointment to see a Primary Care Physician on 5/24. They will be the most important person to help you stay healthy as you get older.   If you have recurrence of your symptoms, chest pain, shortness of breath, or for any other concern, please return to the ED for evaluation.   Thanks for letting us take care of you.   Sincerely,  Paula Compton, MD Family Medicine - PGY 1

## 2014-10-30 ENCOUNTER — Encounter: Payer: Self-pay | Admitting: Family Medicine

## 2014-10-30 LAB — CULTURE, BLOOD (ROUTINE X 2)
Culture: NO GROWTH
Culture: NO GROWTH

## 2014-10-31 ENCOUNTER — Encounter: Payer: Self-pay | Admitting: Family Medicine

## 2014-10-31 ENCOUNTER — Ambulatory Visit: Payer: Medicaid Other | Attending: Family Medicine | Admitting: Family Medicine

## 2014-10-31 VITALS — BP 102/70 | HR 96 | Temp 98.1°F | Resp 18 | Ht 67.0 in | Wt 156.0 lb

## 2014-10-31 DIAGNOSIS — E1140 Type 2 diabetes mellitus with diabetic neuropathy, unspecified: Secondary | ICD-10-CM | POA: Insufficient documentation

## 2014-10-31 DIAGNOSIS — E11649 Type 2 diabetes mellitus with hypoglycemia without coma: Secondary | ICD-10-CM

## 2014-10-31 DIAGNOSIS — G629 Polyneuropathy, unspecified: Secondary | ICD-10-CM

## 2014-10-31 DIAGNOSIS — E1149 Type 2 diabetes mellitus with other diabetic neurological complication: Secondary | ICD-10-CM

## 2014-10-31 LAB — POCT URINALYSIS DIPSTICK
Bilirubin, UA: NEGATIVE
GLUCOSE UA: 500
KETONES UA: NEGATIVE
LEUKOCYTES UA: NEGATIVE
Nitrite, UA: NEGATIVE
PH UA: 5.5
Protein, UA: NEGATIVE
SPEC GRAV UA: 1.01
Urobilinogen, UA: 0.2

## 2014-10-31 LAB — GLUCOSE, POCT (MANUAL RESULT ENTRY)
POC Glucose: 500 mg/dl — AB (ref 70–99)
POC Glucose: 466 mg/dl — AB (ref 70–99)

## 2014-10-31 MED ORDER — TRUEPLUS LANCETS 28G MISC: 1.0000 | Freq: Three times a day (TID) | Status: DC

## 2014-10-31 MED ORDER — TRUE METRIX METER W/DEVICE KIT: 1.0000 | PACK | Freq: Three times a day (TID) | Status: DC

## 2014-10-31 MED ORDER — INSULIN ASPART 100 UNIT/ML ~~LOC~~ SOLN
10.0000 [IU] | Freq: Once | SUBCUTANEOUS | Status: AC
  Administered 2014-10-31: 10 [IU] via SUBCUTANEOUS

## 2014-10-31 MED ORDER — GLUCOSE BLOOD VI STRP: ORAL_STRIP | Status: DC

## 2014-10-31 MED ORDER — METFORMIN HCL 500 MG PO TABS: 1000.0000 mg | ORAL_TABLET | Freq: Two times a day (BID) | ORAL | Status: DC

## 2014-10-31 MED ORDER — GABAPENTIN 300 MG PO CAPS: 300.0000 mg | ORAL_CAPSULE | Freq: Two times a day (BID) | ORAL | Status: DC

## 2014-10-31 NOTE — Progress Notes (Signed)
Quick Note:  Labs addressed at office as well as medications and patient was made aware. ______ 

## 2014-10-31 NOTE — Progress Notes (Addendum)
Subjective:    Patient ID: Melvin [name], male    DOB: 1960/11/17, 54 y.o.   MRN: [id number]  HPI  Admit Date: 10/1714 Discharge Date: 10/25/14  Melvin [name] had presented to the ED with presyncope and hypothermia (with a temperature of 107F) and was found to be hypogycemic with a blood sugar of 77 and had endorsed taking 8 units of Lantus that morning.  He had woken up with chills and was sweating and denied any loss of consciousness; he had a urine drug screen which came back positive and his lactic acid was normal. His troponins were normal and an EKG revealed normal sinus rhythm with no acute changes. He did have a 2-D echo which revealed grade 1 diastolic dysfunction with an EF of 65-70% and he was placed on a low-dose ACE inhibitor.   His hypothermia also resolved and during his hospital hospitalization he was treated  for penile ulcers with acyclovir. He was found to have a hemoglobin A1c of 11.1 and was placed on metformin and discharged on Lantus 8 units once a day.    Interval history: He reports doing well but his blood sugar is 500 in the clinic today. He took his metformin 500 mg last night and Lantus 8 units this morning; he had an egg for breakfast and a chocolate chip cookie about an hour ago. He does admit to burning pains in shooting pains in his feet and his hands and intermittent numbness. His PCP is Iona Beard at the [place] [facility] whom he hasn't seen in 2 years due to financial constraints.  Past Medical History  Diagnosis Date  . Diabetes mellitus   . Meningitis   . Pneumonia   . GERD (gastroesophageal reflux disease)   . Pancreatitis   . Angina   . CHF (congestive heart failure)   . Hypertension    Past Surgical History  Procedure Laterality Date  . Tonsillectomy and adenoidectomy      "as a kid"  . Ercp  08/02/2011    Procedure: ENDOSCOPIC RETROGRADE CHOLANGIOPANCREATOGRAPHY (ERCP);  Surgeon: Beryle Beams, MD;  Location: [facility] ENDOSCOPY;  Service:  Endoscopy;  Laterality: N/A;  . Ercp  08/22/2011    Procedure: ENDOSCOPIC RETROGRADE CHOLANGIOPANCREATOGRAPHY (ERCP);  Surgeon: Beryle Beams, MD;  Location: [facility] ENDOSCOPY;  Service: Endoscopy;  Laterality: N/A;  . Tympanostomy tube placement      History   Social History  . Marital Status: Single    Spouse Name: N/A  . Number of Children: N/A  . Years of Education: N/A   Occupational History  . Not on file.   Social History Main Topics  . Smoking status: Never Smoker   . Smokeless tobacco: Never Used  . Alcohol Use: No     Comment: Past daily use; ocassional  . Drug Use: No     Comment: "age 61 I smoked crack"; last cocaine use last week.  Marland Kitchen Sexual Activity: Not on file   Other Topics Concern  . Not on file   Social History Narrative    Family History  Problem Relation Age of Onset  . Heart failure Mother   . Diabetes Mother   . Hypertension Mother   . Heart disease Father   . Heart failure Father     No Known Allergies  Current Outpatient Prescriptions on File Prior to Visit  Medication Sig Dispense Refill  . acyclovir (ZOVIRAX) 400 MG tablet Take 1 tablet (400 mg total) by mouth 3 (three) times daily.  27 tablet 0  . aspirin 81 MG EC tablet Take 1 tablet (81 mg total) by mouth daily. 90 tablet 0  . atorvastatin (LIPITOR) 10 MG tablet Take 1 tablet (10 mg total) by mouth daily at 6 PM. 30 tablet 0  . insulin glargine (LANTUS) 100 UNIT/ML injection Inject 0.08 mLs (8 Units total) into the skin daily. 10 mL 11  . lisinopril (PRINIVIL,ZESTRIL) 2.5 MG tablet Take 1 tablet (2.5 mg total) by mouth daily. 30 tablet 0   No current facility-administered medications on file prior to visit.     Review of Systems  Constitutional: Negative for activity change and appetite change.  HENT: Negative for sinus pressure and sore throat.   Eyes: Positive for visual disturbance.  Respiratory: Negative for chest tightness and shortness of breath.   Cardiovascular: Negative for  chest pain and palpitations.  Gastrointestinal: Negative for abdominal pain and abdominal distention.  Endocrine: Negative for cold intolerance, heat intolerance and polyphagia.  Genitourinary: Negative for dysuria, frequency and difficulty urinating.  Musculoskeletal: Negative for back pain, joint swelling and arthralgias.  Skin: Negative for color change.  Neurological: Negative for dizziness, tremors and weakness.  Psychiatric/Behavioral: Negative for suicidal ideas and behavioral problems.         Objective: Filed Vitals:   10/31/14 1051  BP: 102/70  Pulse: 96  Temp: 98.1 F (36.7 C)  TempSrc: Oral  Resp: 18  Height: 5\' 7"  (1.702 m)  Weight: 156 lb (70.761 kg)  SpO2: 99%      Physical Exam  Constitutional: normal appearing,  Eyes: PERRLA HEENT: Head is atraumatic, normal sinuses, normal oropharynx, normal appearing tonsils and palate, tympanic membrane is normal bilaterally. Neck: normal range of motion, no thyromegaly, no JVD Cardiovascular: normal rate and rhythm, normal heart sounds, 2/6 systolic murmur, rub or gallop, no pedal edema Respiratory: clear to auscultation bilaterally, no wheezes, no rales, no rhonchi Abdomen: soft, not tender to palpation, normal bowel sounds, no enlarged organs Extremities: Full ROM, no tenderness in joints Skin: warm and dry, no lesions. Neurological: alert, oriented x3, cranial nerves I-XII grossly intact , normal motor strength, normal sensation. Psychological: normal mood.   Lab Results  Component Value Date   HGBA1C 11.1* 10/24/2014    CMP Latest Ref Rng 10/25/2014 10/24/2014 02/25/2012  Glucose 65 - 99 mg/dL 106(H) 110(H) 333(H)  BUN 6 - 20 mg/dL 10 8 11   Creatinine 0.61 - 1.24 mg/dL 0.71 0.77 0.90  Sodium 135 - 145 mmol/L 134(L) 137 135  Potassium 3.5 - 5.1 mmol/L 3.4(L) 2.9(L) 4.5  Chloride 101 - 111 mmol/L 103 100(L) 98  CO2 22 - 32 mmol/L 24 26 -  Calcium 8.9 - 10.3 mg/dL 8.3(L) 9.2 -  Total Protein 6.5 - 8.1 g/dL -  7.6 -  Total Bilirubin 0.3 - 1.2 mg/dL - 1.4(H) -  Alkaline Phos 38 - 126 U/L - 77 -  AST 15 - 41 U/L - 32 -  ALT 17 - 63 U/L - 19 -          Assessment & Plan:  54 year old male patient with a history of hyperlipidemia, uncontrolled diabetes mellitus and diabetic neuropathy here for hospital follow-up after an episode of hypoglycemia and hypothermia now doing well on medications.  Uncontrolled Diabetes Mellitus A1c of 11.1, CABG of 500 in the clinic, urinalysis negative for ketones and so NovoLog 10 units given and CBG recheck in 45 minutes. Increase metformin from 500 mg to 1000 mg twice daily and continue Lantus 8 units at  nighttime; caution exercised due to his recent history of hypoglycemia. Advised to keep blood sugar log fasting, 2 hours post lunch and bedtime which will be reviewed at the next office visit. Advised to check with [facility], [facility], [facility], [facility] for eye examinations as his lack of health coverage will be severe limitation to ophthalmology referral especially with his financial constraints.  Diabetic neuropathy: Commenced on gabapentin; side effects discussed.  Hyperlipidemia: Continue statin.  Disclaimer: This note was dictated with voice recognition software. Similar sounding words can inadvertently be transcribed and this note may contain transcription errors which may not have been corrected upon publication of note.

## 2014-10-31 NOTE — Progress Notes (Signed)
Patients blood glucose currently 500. Patient had 8u Lantus at 7am, ate breakfast at 8:30am and had a cookie around 9:30am.   Patient hospitalized last week for diabetes and heart failure. Patient reports feeling better. Patient denies pain at this time. Patient needs new glucometer.

## 2014-10-31 NOTE — Patient Instructions (Signed)
Diabetes Mellitus and Food It is important for you to manage your blood sugar (glucose) level. Your blood glucose level can be greatly affected by what you eat. Eating healthier foods in the appropriate amounts throughout the day at about the same time each day will help you control your blood glucose level. It can also help slow or prevent worsening of your diabetes mellitus. Healthy eating may even help you improve the level of your blood pressure and reach or maintain a healthy weight.  HOW CAN FOOD AFFECT ME? Carbohydrates Carbohydrates affect your blood glucose level more than any other type of food. Your dietitian will help you determine how many carbohydrates to eat at each meal and teach you how to count carbohydrates. Counting carbohydrates is important to keep your blood glucose at a healthy level, especially if you are using insulin or taking certain medicines for diabetes mellitus. Alcohol Alcohol can cause sudden decreases in blood glucose (hypoglycemia), especially if you use insulin or take certain medicines for diabetes mellitus. Hypoglycemia can be a life-threatening condition. Symptoms of hypoglycemia (sleepiness, dizziness, and disorientation) are similar to symptoms of having too much alcohol.  If your health care provider has given you approval to drink alcohol, do so in moderation and use the following guidelines:  Women should not have more than one drink per day, and men should not have more than two drinks per day. One drink is equal to:  12 oz of beer.  5 oz of wine.  1 oz of hard liquor.  Do not drink on an empty stomach.  Keep yourself hydrated. Have water, diet soda, or unsweetened iced tea.  Regular soda, juice, and other mixers might contain a lot of carbohydrates and should be counted. WHAT FOODS ARE NOT RECOMMENDED? As you make food choices, it is important to remember that all foods are not the same. Some foods have fewer nutrients per serving than other  foods, even though they might have the same number of calories or carbohydrates. It is difficult to get your body what it needs when you eat foods with fewer nutrients. Examples of foods that you should avoid that are high in calories and carbohydrates but low in nutrients include:  Trans fats (most processed foods list trans fats on the Nutrition Facts label).  Regular soda.  Juice.  Candy.  Sweets, such as cake, pie, doughnuts, and cookies.  Fried foods. WHAT FOODS CAN I EAT? Have nutrient-rich foods, which will nourish your body and keep you healthy. The food you should eat also will depend on several factors, including:  The calories you need.  The medicines you take.  Your weight.  Your blood glucose level.  Your blood pressure level.  Your cholesterol level. You also should eat a variety of foods, including:  Protein, such as meat, poultry, fish, tofu, nuts, and seeds (lean animal proteins are best).  Fruits.  Vegetables.  Dairy products, such as milk, cheese, and yogurt (low fat is best).  Breads, grains, pasta, cereal, rice, and beans.  Fats such as olive oil, trans fat-free margarine, canola oil, avocado, and olives. DOES EVERYONE WITH DIABETES MELLITUS HAVE THE SAME MEAL PLAN? Because every person with diabetes mellitus is different, there is not one meal plan that works for everyone. It is very important that you meet with a dietitian who will help you create a meal plan that is just right for you. Document Released: 02/20/2005 Document Revised: 05/31/2013 Document Reviewed: 04/22/2013 [employer] Patient Information 2015 [employer]. This   information is not intended to replace advice given to you by your health care provider. Make sure you discuss any questions you have with your health care provider.  

## 2014-11-07 ENCOUNTER — Ambulatory Visit: Payer: Medicaid Other | Attending: Family Medicine | Admitting: Family Medicine

## 2014-11-07 ENCOUNTER — Encounter: Payer: Self-pay | Admitting: Family Medicine

## 2014-11-07 VITALS — BP 95/65 | HR 88 | Temp 98.2°F | Resp 18 | Ht 67.0 in | Wt 158.0 lb

## 2014-11-07 DIAGNOSIS — E119 Type 2 diabetes mellitus without complications: Secondary | ICD-10-CM | POA: Diagnosis not present

## 2014-11-07 DIAGNOSIS — E1140 Type 2 diabetes mellitus with diabetic neuropathy, unspecified: Secondary | ICD-10-CM | POA: Insufficient documentation

## 2014-11-07 DIAGNOSIS — E1165 Type 2 diabetes mellitus with hyperglycemia: Secondary | ICD-10-CM | POA: Insufficient documentation

## 2014-11-07 DIAGNOSIS — E785 Hyperlipidemia, unspecified: Secondary | ICD-10-CM | POA: Diagnosis not present

## 2014-11-07 DIAGNOSIS — E118 Type 2 diabetes mellitus with unspecified complications: Secondary | ICD-10-CM | POA: Diagnosis not present

## 2014-11-07 LAB — GLUCOSE, POCT (MANUAL RESULT ENTRY): POC GLUCOSE: 373 mg/dL — AB (ref 70–99)

## 2014-11-07 MED ORDER — INSULIN GLARGINE 100 UNIT/ML ~~LOC~~ SOLN: 13.0000 [IU] | Freq: Every day | SUBCUTANEOUS | Status: DC

## 2014-11-07 NOTE — Progress Notes (Signed)
Quick Note:  Labs addressed at office as well as medications and patient was made aware. ______ 

## 2014-11-07 NOTE — Patient Instructions (Signed)
Diabetes Mellitus and Food It is important for you to manage your blood sugar (glucose) level. Your blood glucose level can be greatly affected by what you eat. Eating healthier foods in the appropriate amounts throughout the day at about the same time each day will help you control your blood glucose level. It can also help slow or prevent worsening of your diabetes mellitus. Healthy eating may even help you improve the level of your blood pressure and reach or maintain a healthy weight.  HOW CAN FOOD AFFECT ME? Carbohydrates Carbohydrates affect your blood glucose level more than any other type of food. Your dietitian will help you determine how many carbohydrates to eat at each meal and teach you how to count carbohydrates. Counting carbohydrates is important to keep your blood glucose at a healthy level, especially if you are using insulin or taking certain medicines for diabetes mellitus. Alcohol Alcohol can cause sudden decreases in blood glucose (hypoglycemia), especially if you use insulin or take certain medicines for diabetes mellitus. Hypoglycemia can be a life-threatening condition. Symptoms of hypoglycemia (sleepiness, dizziness, and disorientation) are similar to symptoms of having too much alcohol.  If your health care provider has given you approval to drink alcohol, do so in moderation and use the following guidelines:  Women should not have more than one drink per day, and men should not have more than two drinks per day. One drink is equal to:  12 oz of beer.  5 oz of wine.  1 oz of hard liquor.  Do not drink on an empty stomach.  Keep yourself hydrated. Have water, diet soda, or unsweetened iced tea.  Regular soda, juice, and other mixers might contain a lot of carbohydrates and should be counted. WHAT FOODS ARE NOT RECOMMENDED? As you make food choices, it is important to remember that all foods are not the same. Some foods have fewer nutrients per serving than other  foods, even though they might have the same number of calories or carbohydrates. It is difficult to get your body what it needs when you eat foods with fewer nutrients. Examples of foods that you should avoid that are high in calories and carbohydrates but low in nutrients include:  Trans fats (most processed foods list trans fats on the Nutrition Facts label).  Regular soda.  Juice.  Candy.  Sweets, such as cake, pie, doughnuts, and cookies.  Fried foods. WHAT FOODS CAN I EAT? Have nutrient-rich foods, which will nourish your body and keep you healthy. The food you should eat also will depend on several factors, including:  The calories you need.  The medicines you take.  Your weight.  Your blood glucose level.  Your blood pressure level.  Your cholesterol level. You also should eat a variety of foods, including:  Protein, such as meat, poultry, fish, tofu, nuts, and seeds (lean animal proteins are best).  Fruits.  Vegetables.  Dairy products, such as milk, cheese, and yogurt (low fat is best).  Breads, grains, pasta, cereal, rice, and beans.  Fats such as olive oil, trans fat-free margarine, canola oil, avocado, and olives. DOES EVERYONE WITH DIABETES MELLITUS HAVE THE SAME MEAL PLAN? Because every person with diabetes mellitus is different, there is not one meal plan that works for everyone. It is very important that you meet with a dietitian who will help you create a meal plan that is just right for you. Document Released: 02/20/2005 Document Revised: 05/31/2013 Document Reviewed: 04/22/2013 [employer] Patient Information 2015 [employer]. This   information is not intended to replace advice given to you by your health care provider. Make sure you discuss any questions you have with your health care provider.  

## 2014-11-07 NOTE — Progress Notes (Signed)
Subjective:    Patient ID: Melvin [name], male    DOB: [birth year], 54 y.o.   MRN: [id number]  HPI Melvin [name] is a 54 year old male with a history of Type 2 DM , Hyperlipidemia, Diabetic Neuropathy who was seen at his last office visit for uncontrolled DM with Hba1c of 11.1  and his Metformin dose was increased. He is here for a review of his blood sugar log which reveals:  Fasting sugars : 131- 215 Before lunch 165-210 Denies hypoglycemic episodes.   He had a  Recent hospitalization 5/17-5/18 after he presented to the ED with presyncope and hypothermia (with a temperature of 65F) and was found to be hypogycemic with a blood sugar of 77 and had endorsed taking 8 units of Lantus that morning. He had woken up with chills and was sweating and denied any loss of consciousness; he had a urine drug screen which came back positive and his lactic acid was normal. His troponins were normal and an EKG revealed normal sinus rhythm with no acute changes. He did have a 2-D echo which revealed grade 1 diastolic dysfunction with an EF of 65-70% and he was placed on a low-dose ACE inhibitor.  He has no additional concerns today.  Past Medical History  Diagnosis Date  . Diabetes mellitus   . Meningitis   . Pneumonia   . GERD (gastroesophageal reflux disease)   . Pancreatitis   . Angina   . CHF (congestive heart failure)   . Hypertension     Past Surgical History  Procedure Laterality Date  . Tonsillectomy and adenoidectomy      "as a kid"  . Ercp  08/02/2011    Procedure: ENDOSCOPIC RETROGRADE CHOLANGIOPANCREATOGRAPHY (ERCP);  Surgeon: Beryle Beams, MD;  Location: [facility] ENDOSCOPY;  Service: Endoscopy;  Laterality: N/A;  . Ercp  08/22/2011    Procedure: ENDOSCOPIC RETROGRADE CHOLANGIOPANCREATOGRAPHY (ERCP);  Surgeon: Beryle Beams, MD;  Location: [facility] ENDOSCOPY;  Service: Endoscopy;  Laterality: N/A;  . Tympanostomy tube placement     History   Social History  . Marital Status: Single      Spouse Name: N/A  . Number of Children: N/A  . Years of Education: N/A   Occupational History  . Not on file.   Social History Main Topics  . Smoking status: Never Smoker   . Smokeless tobacco: Never Used  . Alcohol Use: No     Comment: Past daily use; ocassional  . Drug Use: No     Comment: "age 44 I smoked crack"; last cocaine use last week.  Marland Kitchen Sexual Activity: Not on file   Other Topics Concern  . Not on file   Social History Narrative     Family History  Problem Relation Age of Onset  . Heart failure Mother   . Diabetes Mother   . Hypertension Mother   . Heart disease Father   . Heart failure Father     History   Social History  . Marital Status: Single    Spouse Name: N/A  . Number of Children: N/A  . Years of Education: N/A   Occupational History  . Not on file.   Social History Main Topics  . Smoking status: Never Smoker   . Smokeless tobacco: Never Used  . Alcohol Use: No     Comment: Past daily use; ocassional  . Drug Use: No     Comment: "age 48 I smoked crack"; last cocaine use last week.  Marland Kitchen  Sexual Activity: Not on file   Other Topics Concern  . Not on file   Social History Narrative    Review of Systems  General: negative for fever, weight loss, appetite change ENT: no ear symptoms, no sinus tenderness, no nasal congestion or sore throat. Neck: no pain  Respiratory: no wheezing, shortness of breath, cough Cardiovascular: no chest pain, no dyspnea on exertion, no pedal edema, no orthopnea. Gastrointestinal: no abdominal pain, no diarrhea, no constipation Genito-Urinary: no urinary frequency, no dysuria, no polyuria. Hematologic: no bruising Endocrine: no cold or heat intolerance    Objective: Filed Vitals:   11/07/14 1057  BP: 95/65  Pulse: 88  Temp: 98.2 F (36.8 C)  TempSrc: Oral  Resp: 18  Height: 5\' 7"  (1.702 m)  Weight: 158 lb (71.668 kg)  SpO2: 98%      Physical Exam Constitutional: normal appearing,  Eyes:  PERRLA Neck: normal range of motion, no thyromegaly, no JVD Cardiovascular: normal rate and rhythm, normal heart sounds, no murmurs, rub or gallop, no pedal edema Respiratory: clear to auscultation bilaterally, no wheezes, no rales, no rhonchi Abdomen: soft, not tender to palpation, normal bowel sounds, no enlarged organs Extremities: Full ROM, no tenderness in joints Skin: warm and dry, no lesions. Neurological: alert, oriented x3       Assessment & Plan:  54 year old male patient with a history of hyperlipidemia, uncontrolled diabetes mellitus and diabetic neuropathy here for hospital follow-up of his Diabetes and he was recently hospitalized for hypoglycemia and hypothermia now doing well on medications.  Uncontrolled Diabetes Mellitus A1c of 11.1, CABG of 373 in the clinic. No insulin today as he is high risk for hypoglycemia. Continue metformin 1000 mg twice daily and increase Lantus from 8 to 13 units at nighttime; caution exercised due to his recent history of hypoglycemia. Advised to keep blood sugar log fasting, 2 hours post lunch and bedtime which will be reviewed at the next office visit. Advised to check with [facility], [facility], [facility] for eye examinations as his lack of health coverage will be severe limitation to ophthalmology referral especially with his financial constraints. CMET at next office visit  Diabetic neuropathy: Doing well on gabapentin; side effects discussed.  Hyperlipidemia: Continue statin.

## 2014-11-07 NOTE — Progress Notes (Signed)
Patient reports feeling good. Has log with blood sugar results. Current blood glucose is 373. Patient reports taking metformin this morning and Lantus 8 units last night. Last ate around 7:30am.

## 2014-11-14 ENCOUNTER — Ambulatory Visit: Payer: Medicaid Other | Attending: Family Medicine | Admitting: Family Medicine

## 2014-11-14 ENCOUNTER — Encounter: Payer: Self-pay | Admitting: Family Medicine

## 2014-11-14 VITALS — BP 98/67 | HR 102 | Temp 98.2°F | Resp 18 | Ht 67.0 in | Wt 157.6 lb

## 2014-11-14 DIAGNOSIS — Z794 Long term (current) use of insulin: Secondary | ICD-10-CM | POA: Diagnosis not present

## 2014-11-14 DIAGNOSIS — N529 Male erectile dysfunction, unspecified: Secondary | ICD-10-CM | POA: Diagnosis not present

## 2014-11-14 DIAGNOSIS — G629 Polyneuropathy, unspecified: Secondary | ICD-10-CM | POA: Diagnosis not present

## 2014-11-14 DIAGNOSIS — E118 Type 2 diabetes mellitus with unspecified complications: Secondary | ICD-10-CM

## 2014-11-14 DIAGNOSIS — E785 Hyperlipidemia, unspecified: Secondary | ICD-10-CM | POA: Diagnosis not present

## 2014-11-14 LAB — BASIC METABOLIC PANEL
BUN: 12 mg/dL (ref 6–23)
CHLORIDE: 95 meq/L — AB (ref 96–112)
CO2: 28 meq/L (ref 19–32)
CREATININE: 0.86 mg/dL (ref 0.50–1.35)
Calcium: 9.9 mg/dL (ref 8.4–10.5)
Glucose, Bld: 398 mg/dL — ABNORMAL HIGH (ref 70–99)
POTASSIUM: 5 meq/L (ref 3.5–5.3)
SODIUM: 131 meq/L — AB (ref 135–145)

## 2014-11-14 LAB — GLUCOSE, POCT (MANUAL RESULT ENTRY): POC Glucose: 373 mg/dl — AB (ref 70–99)

## 2014-11-14 MED ORDER — GABAPENTIN 300 MG PO CAPS: 600.0000 mg | ORAL_CAPSULE | Freq: Two times a day (BID) | ORAL | Status: DC

## 2014-11-14 MED ORDER — METFORMIN HCL 500 MG PO TABS: 1000.0000 mg | ORAL_TABLET | Freq: Two times a day (BID) | ORAL | Status: DC

## 2014-11-14 MED ORDER — SILDENAFIL CITRATE 50 MG PO TABS: 50.0000 mg | ORAL_TABLET | Freq: Every day | ORAL | Status: DC | PRN

## 2014-11-14 MED ORDER — ATORVASTATIN CALCIUM 10 MG PO TABS: 10.0000 mg | ORAL_TABLET | Freq: Every day | ORAL | Status: DC

## 2014-11-14 MED ORDER — ASPIRIN 81 MG PO TBEC: 81.0000 mg | DELAYED_RELEASE_TABLET | Freq: Every day | ORAL | Status: AC

## 2014-11-14 MED ORDER — INSULIN GLARGINE 100 UNIT/ML ~~LOC~~ SOLN: 18.0000 [IU] | Freq: Every day | SUBCUTANEOUS | Status: DC

## 2014-11-14 MED ORDER — LISINOPRIL 2.5 MG PO TABS: 2.5000 mg | ORAL_TABLET | Freq: Every day | ORAL | Status: DC

## 2014-11-14 NOTE — Patient Instructions (Signed)
Diabetes Mellitus and Food It is important for you to manage your blood sugar (glucose) level. Your blood glucose level can be greatly affected by what you eat. Eating healthier foods in the appropriate amounts throughout the day at about the same time each day will help you control your blood glucose level. It can also help slow or prevent worsening of your diabetes mellitus. Healthy eating may even help you improve the level of your blood pressure and reach or maintain a healthy weight.  HOW CAN FOOD AFFECT ME? Carbohydrates Carbohydrates affect your blood glucose level more than any other type of food. Your dietitian will help you determine how many carbohydrates to eat at each meal and teach you how to count carbohydrates. Counting carbohydrates is important to keep your blood glucose at a healthy level, especially if you are using insulin or taking certain medicines for diabetes mellitus. Alcohol Alcohol can cause sudden decreases in blood glucose (hypoglycemia), especially if you use insulin or take certain medicines for diabetes mellitus. Hypoglycemia can be a life-threatening condition. Symptoms of hypoglycemia (sleepiness, dizziness, and disorientation) are similar to symptoms of having too much alcohol.  If your health care provider has given you approval to drink alcohol, do so in moderation and use the following guidelines:  Women should not have more than one drink per day, and men should not have more than two drinks per day. One drink is equal to:  12 oz of beer.  5 oz of wine.  1 oz of hard liquor.  Do not drink on an empty stomach.  Keep yourself hydrated. Have water, diet soda, or unsweetened iced tea.  Regular soda, juice, and other mixers might contain a lot of carbohydrates and should be counted. WHAT FOODS ARE NOT RECOMMENDED? As you make food choices, it is important to remember that all foods are not the same. Some foods have fewer nutrients per serving than other  foods, even though they might have the same number of calories or carbohydrates. It is difficult to get your body what it needs when you eat foods with fewer nutrients. Examples of foods that you should avoid that are high in calories and carbohydrates but low in nutrients include:  Trans fats (most processed foods list trans fats on the Nutrition Facts label).  Regular soda.  Juice.  Candy.  Sweets, such as cake, pie, doughnuts, and cookies.  Fried foods. WHAT FOODS CAN I EAT? Have nutrient-rich foods, which will nourish your body and keep you healthy. The food you should eat also will depend on several factors, including:  The calories you need.  The medicines you take.  Your weight.  Your blood glucose level.  Your blood pressure level.  Your cholesterol level. You also should eat a variety of foods, including:  Protein, such as meat, poultry, fish, tofu, nuts, and seeds (lean animal proteins are best).  Fruits.  Vegetables.  Dairy products, such as milk, cheese, and yogurt (low fat is best).  Breads, grains, pasta, cereal, rice, and beans.  Fats such as olive oil, trans fat-free margarine, canola oil, avocado, and olives. DOES EVERYONE WITH DIABETES MELLITUS HAVE THE SAME MEAL PLAN? Because every person with diabetes mellitus is different, there is not one meal plan that works for everyone. It is very important that you meet with a dietitian who will help you create a meal plan that is just right for you. Document Released: 02/20/2005 Document Revised: 05/31/2013 Document Reviewed: 04/22/2013 [employer] Patient Information 2015 [employer]. This   information is not intended to replace advice given to you by your health care provider. Make sure you discuss any questions you have with your health care provider.  

## 2014-11-14 NOTE — Progress Notes (Signed)
Patient here for follow up for DM.  Patient states he feels the same as he did last time, with the exception of worsening diabetic neuropathy in all extremities; mostly right leg.  Pain in right leg is 6/10.  Pain comes and goes "like lightning, it runs down then stops and comes back to strike again."  Prescribed medication helps relieve pain intermittently.  Patient CBG 183 this morning, current CBG 373.  Patient reports eating liver-pudding, grits, eggs, and toast, Gatorade, and a piece of peppermint candy.

## 2014-11-14 NOTE — Progress Notes (Signed)
Subjective:    Patient ID: Melvin [name], male    DOB: 10-08-1960, 54 y.o.   MRN: [id number]  HPI  Chandra Feger 54 year old male patient with a history of hyperlipidemia, uncontrolled diabetes mellitus and diabetic neuropathy here for follow-up of his Diabetes Mellitus and review of his blood sugar log. States fasting blood sugar was 183 today at home; log reveals: Fasting sugars- 131- 240; random- 161-240 He complains on peripheral neuropathy and remains on Gabapentin which helps some but states he still has shooting pains which feel like "lightening" at rest in his legs, feet and hands. He complains of erectile dysfunction and would like something for it.  Past Medical History  Diagnosis Date  . Diabetes mellitus   . Meningitis   . Pneumonia   . GERD (gastroesophageal reflux disease)   . Pancreatitis   . Angina   . CHF (congestive heart failure)   . Hypertension     Past Surgical History  Procedure Laterality Date  . Tonsillectomy and adenoidectomy      "as a kid"  . Ercp  08/02/2011    Procedure: ENDOSCOPIC RETROGRADE CHOLANGIOPANCREATOGRAPHY (ERCP);  Surgeon: Beryle Beams, MD;  Location: [facility] ENDOSCOPY;  Service: Endoscopy;  Laterality: N/A;  . Ercp  08/22/2011    Procedure: ENDOSCOPIC RETROGRADE CHOLANGIOPANCREATOGRAPHY (ERCP);  Surgeon: Beryle Beams, MD;  Location: [facility] ENDOSCOPY;  Service: Endoscopy;  Laterality: N/A;  . Tympanostomy tube placement      History   Social History  . Marital Status: Single    Spouse Name: N/A  . Number of Children: N/A  . Years of Education: N/A   Occupational History  . Not on file.   Social History Main Topics  . Smoking status: Never Smoker   . Smokeless tobacco: Never Used  . Alcohol Use: 2.4 oz/week    4 Shots of liquor per week     Comment: Past daily use; ocassional beer  . Drug Use: No     Comment: "age 21 I smoked crack"; last cocaine use last month.  . Sexual Activity: Not on file   Other Topics Concern    . Not on file   Social History Narrative    No Known Allergies  Current Outpatient Prescriptions on File Prior to Visit  Medication Sig Dispense Refill  . Blood Glucose Monitoring Suppl (TRUE METRIX METER) W/DEVICE KIT 1 each by Does not apply route 4 (four) times daily -  before meals and at bedtime. 1 kit 0  . glucose blood (TRUE METRIX BLOOD GLUCOSE TEST) test strip Use as instructed 100 each 5  . TRUEPLUS LANCETS 28G MISC 1 each by Does not apply route 4 (four) times daily -  before meals and at bedtime. 100 each 5   Current Facility-Administered Medications on File Prior to Visit  Medication Dose Route Frequency Provider Last Rate Last Dose  . TRUE METRIX METER KIT 1 each  1 each Does not apply TID PC & HS Arnoldo Morale, MD           Review of Systems  General: negative for fever, weight loss, appetite change Eyes: no visual symptoms. ENT: no ear symptoms, no sinus tenderness, no nasal congestion or sore throat. Neck: no pain  Respiratory: no wheezing, shortness of breath, cough Cardiovascular: no chest pain, no dyspnea on exertion, no pedal edema, no orthopnea. Gastrointestinal: no abdominal pain, no diarrhea, no constipation Genito-Urinary: has erectile dysfunction, no urinary frequency, no dysuria, no polyuria. Hematologic: no bruising Endocrine: no  cold or heat intolerance Neurological: no headaches, no seizures, no tremors, has numbness in hands and feet Musculoskeletal: no joint pains, no joint swelling, has leg pains Skin: no pruritus, no rash. Psychological: no depression, no anxiety,      Objective: Filed Vitals:   11/14/14 0917 11/14/14 0925  BP:  98/67  Pulse:  102  Temp:  98.2 F (36.8 C)  TempSrc:  Oral  Resp:  18  Height: _0  (1.702 m)   Weight: 157 lb 9.6 oz (71.487 kg)   SpO2:  99%      Physical Exam  Constitutional: normal appearing,  Neck: normal range of motion, no thyromegaly, no JVD Cardiovascular: tachycardic rate and normal rhythm,  normal heart sounds, no murmurs, rub or gallop, no pedal edema Respiratory: clear to auscultation bilaterally, no wheezes, no rales, no rhonchi Abdomen: soft, not tender to palpation, normal bowel sounds, no enlarged organs Extremities: Full ROM, no tenderness in joints Skin: warm and dry, no lesions. Neurological: alert, oriented x3, cranial nerves I-XII grossly intact , normal motor strength, normal sensation. Psychological: normal mood.         Assessment & Plan:  54 year old male patient with a history of hyperlipidemia, uncontrolled diabetes mellitus and diabetic neuropathy here for follow-up of his Diabetes, medical history is notable for recent hospitalization for hypoglycemia and hypothermia now doing well on medications.  Uncontrolled Diabetes Mellitus A1c of 11.1, CBG of 373 in the clinic. No insulin today as he had breakfast an hour ago Continue metformin 1000 mg twice daily and increase Lantus from 13 to 18 units at nighttime; caution exercised due to his recent history of hypoglycemia. Advised to keep blood sugar log fasting, 2 hours post lunch and bedtime which will be reviewed at the next office visit. Up to date on Pneumovax Advised to check with [facility], [facility], [facility], [facility] for eye examinations as his lack of health coverage will be severe limitation to ophthalmology referral especially with his financial constraints.   Diabetic neuropathy: Uncontrolled on current dose of gabapentin; increased from 300 to 652m bid side effects discussed.  Hyperlipidemia: Continue statin  Erectile Dysfunction: Placed on Viagra  This note has been created with DSurveyor, quantity Any transcriptional errors are unintentional.

## 2014-11-15 ENCOUNTER — Telehealth: Payer: Self-pay | Admitting: Family Medicine

## 2014-11-15 ENCOUNTER — Ambulatory Visit: Payer: Self-pay | Admitting: Family Medicine

## 2014-11-15 ENCOUNTER — Telehealth: Payer: Self-pay

## 2014-11-15 NOTE — Telephone Encounter (Signed)
-----   Message from Arnoldo Morale, MD sent at 11/14/2014 10:47 PM EDT ----- Please inform him his low potassium has normalized, sodium is a bit on the low side and will need monitoring.

## 2014-11-15 NOTE — Telephone Encounter (Signed)
Patient called returning nurse's phone call to review results. °

## 2014-11-15 NOTE — Telephone Encounter (Signed)
Nurse called patient, reached voicemail. Left message for patient to call Weronika Birch at [phone].   

## 2014-11-15 NOTE — Telephone Encounter (Signed)
Patient returned call to [facility]. Patient verified date of birth. Patient aware of normal potassium and low sodium. Patient aware of need to monitor sodium. Patient voices understanding and has no questions at this time.

## 2014-11-27 ENCOUNTER — Ambulatory Visit: Payer: Medicaid Other | Attending: Family Medicine

## 2014-12-13 ENCOUNTER — Ambulatory Visit: Payer: Medicaid Other | Attending: Internal Medicine | Admitting: Internal Medicine

## 2014-12-13 VITALS — BP 99/67 | HR 89 | Temp 97.8°F | Ht 68.0 in | Wt 156.0 lb

## 2014-12-13 DIAGNOSIS — K219 Gastro-esophageal reflux disease without esophagitis: Secondary | ICD-10-CM | POA: Diagnosis not present

## 2014-12-13 DIAGNOSIS — I952 Hypotension due to drugs: Secondary | ICD-10-CM | POA: Diagnosis not present

## 2014-12-13 DIAGNOSIS — E1165 Type 2 diabetes mellitus with hyperglycemia: Secondary | ICD-10-CM | POA: Diagnosis not present

## 2014-12-13 DIAGNOSIS — Z79899 Other long term (current) drug therapy: Secondary | ICD-10-CM | POA: Diagnosis not present

## 2014-12-13 DIAGNOSIS — Z794 Long term (current) use of insulin: Secondary | ICD-10-CM | POA: Insufficient documentation

## 2014-12-13 DIAGNOSIS — Z7982 Long term (current) use of aspirin: Secondary | ICD-10-CM | POA: Diagnosis not present

## 2014-12-13 LAB — POCT GLYCOSYLATED HEMOGLOBIN (HGB A1C): Hemoglobin A1C: 11.4

## 2014-12-13 LAB — BASIC METABOLIC PANEL
BUN: 15 mg/dL (ref 6–23)
CHLORIDE: 103 meq/L (ref 96–112)
CO2: 23 meq/L (ref 19–32)
CREATININE: 1.14 mg/dL (ref 0.50–1.35)
Calcium: 9.4 mg/dL (ref 8.4–10.5)
GLUCOSE: 365 mg/dL — AB (ref 70–99)
POTASSIUM: 4.2 meq/L (ref 3.5–5.3)
Sodium: 134 mEq/L — ABNORMAL LOW (ref 135–145)

## 2014-12-13 LAB — POCT URINALYSIS DIPSTICK
Bilirubin, UA: NEGATIVE
Glucose, UA: 500
Ketones, UA: NEGATIVE
Leukocytes, UA: NEGATIVE
Nitrite, UA: NEGATIVE
Protein, UA: 30
Spec Grav, UA: 1.015
UROBILINOGEN UA: 0.2
pH, UA: 5.5

## 2014-12-13 LAB — GLUCOSE, POCT (MANUAL RESULT ENTRY)
POC Glucose: 360 mg/dl — AB (ref 70–99)
POC Glucose: 396 mg/dl — AB (ref 70–99)

## 2014-12-13 MED ORDER — SODIUM CHLORIDE 0.9 % IV BOLUS (SEPSIS): 500.0000 mL | Freq: Once | INTRAVENOUS | Status: DC

## 2014-12-13 MED ORDER — INSULIN ASPART 100 UNIT/ML ~~LOC~~ SOLN
10.0000 [IU] | Freq: Once | SUBCUTANEOUS | Status: AC
  Administered 2014-12-13: 10 [IU] via SUBCUTANEOUS

## 2014-12-13 MED ORDER — SODIUM CHLORIDE 0.9 % IV SOLN
Freq: Once | INTRAVENOUS | Status: AC
  Administered 2014-12-13: 500 mL via INTRAVENOUS

## 2014-12-13 NOTE — Patient Instructions (Signed)
I want you to begin on Lantus 18 units nightly. Make sure you record your sugar level 3 times per day and bring it to every visit. I will make changes once we review your sugar log.

## 2014-12-13 NOTE — Progress Notes (Signed)
Patient ID: Melvin [name], male   DOB: [birth year], 54 y.o.   MRN: [id number] CC:  HPI: Melvin [name] is a 54 y.o. male here today for a follow up visit.  Patient has past medical history of CHF, DM, GERD. He reports that he took 13 units of Lantus last night and took one Metformin this AM. Chart reveals that patient should be on 18 units of Lantus daily. Fasting blood sugars in AM are 180 to 310. Evening cbg readings are usually around 300. He did not bring a log book today. He c/o feeling weak and tired. He states that he took Lisinopril 2.5 mg this morning and he believes that is why he feels weak. He c/o of decreased sensation in his BLE and his hands.    Sharp chest pain last night that only lasted a short minute. He states that he did not get SOB or dizzy. No pains today.    Patient has No headache, No chest pain, No abdominal pain - No Nausea, No new weakness tingling or numbness, No Cough - SOB.  No Known Allergies Past Medical History  Diagnosis Date  . Diabetes mellitus   . Meningitis   . Pneumonia   . GERD (gastroesophageal reflux disease)   . Pancreatitis   . Angina   . CHF (congestive heart failure)   . Hypertension    Current Outpatient Prescriptions on File Prior to Visit  Medication Sig Dispense Refill  . aspirin 81 MG EC tablet Take 1 tablet (81 mg total) by mouth daily. 30 tablet 3  . atorvastatin (LIPITOR) 10 MG tablet Take 1 tablet (10 mg total) by mouth daily at 6 PM. 30 tablet 3  . Blood Glucose Monitoring Suppl (TRUE METRIX METER) W/DEVICE KIT 1 each by Does not apply route 4 (four) times daily -  before meals and at bedtime. 1 kit 0  . gabapentin (NEURONTIN) 300 MG capsule Take 2 capsules (600 mg total) by mouth 2 (two) times daily. 120 capsule 3  . glucose blood (TRUE METRIX BLOOD GLUCOSE TEST) test strip Use as instructed 100 each 5  . insulin glargine (LANTUS) 100 UNIT/ML injection Inject 0.18 mLs (18 Units total) into the skin daily. 10 mL 3  .  metFORMIN (GLUCOPHAGE) 500 MG tablet Take 2 tablets (1,000 mg total) by mouth 2 (two) times daily with a meal. 120 tablet 2  . TRUEPLUS LANCETS 28G MISC 1 each by Does not apply route 4 (four) times daily -  before meals and at bedtime. 100 each 5  . sildenafil (VIAGRA) 50 MG tablet Take 1 tablet (50 mg total) by mouth daily as needed for erectile dysfunction. (Patient not taking: Reported on 12/13/2014) 10 tablet 1   Current Facility-Administered Medications on File Prior to Visit  Medication Dose Route Frequency Provider Last Rate Last Dose  . TRUE METRIX METER KIT 1 each  1 each Does not apply TID PC & HS Arnoldo Morale, MD       Family History  Problem Relation Age of Onset  . Heart failure Mother   . Diabetes Mother   . Hypertension Mother   . Heart disease Father   . Heart failure Father    History   Social History  . Marital Status: Single    Spouse Name: N/A  . Number of Children: N/A  . Years of Education: N/A   Occupational History  . Not on file.   Social History Main Topics  . Smoking status: Never Smoker   .  Smokeless tobacco: Never Used  . Alcohol Use: 2.4 oz/week    4 Shots of liquor per week     Comment: Past daily use; ocassional beer  . Drug Use: No     Comment: "age 63 I smoked crack"; last cocaine use last month.  . Sexual Activity: Not on file   Other Topics Concern  . Not on file   Social History Narrative    Review of Systems: See HPI   Objective:   Filed Vitals:   12/13/14 1023  BP: 70/44  Pulse: 98  Temp: 97.8 F (36.6 C)    Physical Exam  Constitutional: He is oriented to person, place, and time.  Cardiovascular: Normal rate, regular rhythm and normal heart sounds.   Pulmonary/Chest: Effort normal and breath sounds normal.  Neurological: He is alert and oriented to person, place, and time. No cranial nerve deficit.     Lab Results  Component Value Date   WBC 4.7 10/25/2014   HGB 11.6* 10/25/2014   HCT 33.8* 10/25/2014   MCV  90.9 10/25/2014   PLT 189 10/25/2014   Lab Results  Component Value Date   CREATININE 0.86 11/14/2014   BUN 12 11/14/2014   NA 131* 11/14/2014   K 5.0 11/14/2014   CL 95* 11/14/2014   CO2 28 11/14/2014    Lab Results  Component Value Date   HGBA1C 11.4 12/13/2014   Lipid Panel     Component Value Date/Time   CHOL 163 10/25/2014 0250   TRIG 64 10/25/2014 0250   HDL 95 10/25/2014 0250   CHOLHDL 1.7 10/25/2014 0250   VLDL 13 10/25/2014 0250   LDLCALC 55 10/25/2014 0250       Assessment and plan:   Melvin [name] was seen today for hypertension and diabetes.  Diagnoses and all orders for this visit:  Type 2 diabetes mellitus with hyperglycemia Orders: -     Glucose (CBG) -     HgB A1c -     Microalbumin, urine -     Urinalysis Dipstick -     Ambulatory referral to Ophthalmology -     insulin aspart (novoLOG) injection 10 Units; Inject 0.1 mLs (10 Units total) into the skin once. -     Basic Metabolic Panel -     Glucose (CBG) He will increase Lantus to 18 units nightly and RTC in 2 weeks for a the nurse to review cbg log and further medication changes.   Hypotension due to drugs Orders: -     Insert peripheral IV; -     0.9 %  sodium chloride infusion; Inject into the vein once. He will discontinue Lisinopril for now due to hypotension.   Return for 2-3 weeks RN-log review and 3 mo PCP .       Chari Manning, [facility] [phone] 12/13/2014, 10:50 AM

## 2014-12-13 NOTE — Progress Notes (Signed)
Patient here for follow up on his HTN and DM2 He does not need refills Has not had a colonoscopy and is asking for eye doctor referral

## 2014-12-14 ENCOUNTER — Encounter: Payer: Self-pay | Admitting: Internal Medicine

## 2014-12-14 LAB — MICROALBUMIN, URINE: MICROALB UR: 3.9 mg/dL — AB (ref <2.0)

## 2015-01-12 ENCOUNTER — Ambulatory Visit: Payer: Medicaid Other | Attending: Internal Medicine | Admitting: Pharmacist

## 2015-01-12 VITALS — BP 112/77 | HR 109

## 2015-01-12 DIAGNOSIS — I952 Hypotension due to drugs: Secondary | ICD-10-CM | POA: Diagnosis not present

## 2015-01-12 DIAGNOSIS — E1165 Type 2 diabetes mellitus with hyperglycemia: Secondary | ICD-10-CM | POA: Insufficient documentation

## 2015-01-12 NOTE — Progress Notes (Signed)
S:    Patient arrives in good spirits.    He presents to the clinic for hypertension and diabetes evaluation.    Patient reports adherence with medications. Pt states he SMBG at home but did not bring his meter or log with him to clinic today.     States is checking SMBG TID.   SMBG per patient memory:  -Before breakfast ~90-120 -Before lunch ~200 (highest value 230-240) -Before Bed ~300  Pt Diet:  Largest Meal is Dinner -Normal Dinner consists of Meat, 2 vegetables, juice or water.  Lunch - salad or frozen pot pie Breakfast - eggs, bacon, grits, liver pudding.  Sometimes canned breakfast sausage.   Snacks - Peanut butter cookies or cakes.   Drinks - Typically Juice or Water.    Current BP Medications include:  None Current DM Medications include: Metforming 1000 mg BID, Lantus 18 units QHS  Antihypertensives tried in the past include: Lisinopril (d/c for dizziness/ hypotension)  Pt reports no visual changes Pt states has nocturia 2x/night  O:  Pt has wound on right foot that he cannot feel.   Last 3 Office BP readings: BP Readings from Last 3 Encounters:  01/12/15 112/77  12/13/14 99/67  11/14/14 98/67    BMET    Component Value Date/Time   NA 134* 12/13/2014 1121   K 4.2 12/13/2014 1121   CL 103 12/13/2014 1121   CO2 23 12/13/2014 1121   GLUCOSE 365* 12/13/2014 1121   BUN 15 12/13/2014 1121   CREATININE 1.14 12/13/2014 1121   CREATININE 0.71 10/25/2014 0250   CALCIUM 9.4 12/13/2014 1121   GFRNONAA >60 10/25/2014 0250   GFRAA >60 10/25/2014 0250    A/P:  History of hypertension which currently controlled on no medications with a BP of 112/77 today.  Pt has a hx of DM which is currently uncontrolled on current medications.  Pt was unable to provide SMBG log today in clinic but values provided from memory seem to become more uncontrolled throughout the day. Will continue current medications and have pt f/u in one week to determine medication changes.   Instructed pt to bring in TID SMBG readings to next visit. Will have nurse review wound on next visit.  Instructed pt to keep wound clean and to maintain proper foot/skin care.     Results reviewed and written information provided.   F/U Clinic Visit with Dr. Peterson Ao, PharmD on 8/16 .  Total time in face-to-face counseling 30 minutes.  Patient seen with Bennye Alm, PharmD, Pharmacy Resident

## 2015-01-12 NOTE — Patient Instructions (Signed)
Thank you for coming in today Please continue to remember to take your medications on time Please check your blood glucose three times a day and record the readings in the log book for next week

## 2015-01-12 NOTE — Progress Notes (Signed)
I was present for the entirety of the visit. I agree with the pharmacy resident's note as documented.  Nicoletta Ba, PharmD, BCPS Clinical Pharmacist

## 2015-01-12 NOTE — Progress Notes (Deleted)
   States is checking BG TID.   SMBG per patient:  Before breakfast - 90-120 Before lunch - 200 (highest value 230-240) Before Bed - 300 Largest Meal is Statistician, 2 vegetables, juice or water.  Lunch - salad or frozen pot pie Breakfast - eggs, bacon, grits, liver pudding.  Sometimes canned breakfast sausage.   Snacks - Peanut butter cookies or cakes.   Drinks - Typically Juice or Water.   Wake up twice to use the restroom during the night normally Reports no visual changes Pt has wound on right foot.      BP 112 / 77 HR 109  Will f/u next week

## 2015-01-15 ENCOUNTER — Telehealth: Payer: Self-pay | Admitting: Internal Medicine

## 2015-01-15 ENCOUNTER — Telehealth: Payer: Self-pay

## 2015-01-15 NOTE — Telephone Encounter (Signed)
Nurse Nira Conn advised Pt to come in today within the next hour to see Provider, per Provider request. Pt explained he would call around for transportation to [facility].

## 2015-01-15 NOTE — Telephone Encounter (Signed)
Patient called nurse to report right leg swelling and right foot wound with puss. Patient denies any fever. Nurse asked patient to hold to speak with provider about getting him into the clinic. Patient disconnected call while on hold. Nurse called patient, reached voicemail, left message for patient to return call to [facility] [place]. Provider agrees to see patient tomorrow at 2:30pm.

## 2015-01-15 NOTE — Telephone Encounter (Signed)
Patient can not find transportation to come to [facility] today but would like to come tomorrow before going to eye doctor appointment. Patient wanted to come in tomorrow at 1:30pm. Nurse explained to patient office is closed between 1pm-2pm. Patient agrees to come Thursday at 9:30am.

## 2015-01-15 NOTE — Telephone Encounter (Signed)
Pt is stating that his right leg is swollen and has an open wound with puss coming out on his right foot. Pt says he is a diabetic and is not sure if he would need to go to the ER. Please follow up with pt. Thank you.

## 2015-01-18 ENCOUNTER — Ambulatory Visit: Payer: Medicaid Other | Attending: Internal Medicine | Admitting: Internal Medicine

## 2015-01-18 ENCOUNTER — Encounter: Payer: Self-pay | Admitting: Internal Medicine

## 2015-01-18 ENCOUNTER — Ambulatory Visit (HOSPITAL_COMMUNITY)
Admission: RE | Admit: 2015-01-18 | Discharge: 2015-01-18 | Disposition: A | Discharge status: Home / Self Care | Payer: Medicaid Other | Source: Ambulatory Visit | Attending: Internal Medicine | Admitting: Internal Medicine

## 2015-01-18 VITALS — BP 126/85 | HR 86 | Temp 98.3°F | Resp 15 | Ht 68.0 in | Wt 159.2 lb

## 2015-01-18 DIAGNOSIS — I509 Heart failure, unspecified: Secondary | ICD-10-CM | POA: Insufficient documentation

## 2015-01-18 DIAGNOSIS — L97519 Non-pressure chronic ulcer of other part of right foot with unspecified severity: Secondary | ICD-10-CM

## 2015-01-18 DIAGNOSIS — I10 Essential (primary) hypertension: Secondary | ICD-10-CM | POA: Insufficient documentation

## 2015-01-18 DIAGNOSIS — E1140 Type 2 diabetes mellitus with diabetic neuropathy, unspecified: Secondary | ICD-10-CM | POA: Diagnosis not present

## 2015-01-18 DIAGNOSIS — E11621 Type 2 diabetes mellitus with foot ulcer: Secondary | ICD-10-CM | POA: Diagnosis not present

## 2015-01-18 DIAGNOSIS — Z794 Long term (current) use of insulin: Secondary | ICD-10-CM | POA: Insufficient documentation

## 2015-01-18 DIAGNOSIS — K219 Gastro-esophageal reflux disease without esophagitis: Secondary | ICD-10-CM | POA: Insufficient documentation

## 2015-01-18 DIAGNOSIS — E1165 Type 2 diabetes mellitus with hyperglycemia: Secondary | ICD-10-CM | POA: Diagnosis not present

## 2015-01-18 DIAGNOSIS — IMO0002 Reserved for concepts with insufficient information to code with codable children: Secondary | ICD-10-CM

## 2015-01-18 DIAGNOSIS — Z7982 Long term (current) use of aspirin: Secondary | ICD-10-CM | POA: Insufficient documentation

## 2015-01-18 DIAGNOSIS — Z79899 Other long term (current) drug therapy: Secondary | ICD-10-CM | POA: Diagnosis not present

## 2015-01-18 DIAGNOSIS — Z23 Encounter for immunization: Secondary | ICD-10-CM | POA: Insufficient documentation

## 2015-01-18 DIAGNOSIS — M7989 Other specified soft tissue disorders: Secondary | ICD-10-CM | POA: Diagnosis not present

## 2015-01-18 LAB — GLUCOSE, POCT (MANUAL RESULT ENTRY): POC Glucose: 302 mg/dl — AB (ref 70–99)

## 2015-01-18 MED ORDER — CLINDAMYCIN HCL 300 MG PO CAPS: 300.0000 mg | ORAL_CAPSULE | Freq: Two times a day (BID) | ORAL | Status: DC

## 2015-01-18 MED ORDER — INSULIN ASPART 100 UNIT/ML ~~LOC~~ SOLN
10.0000 [IU] | Freq: Once | SUBCUTANEOUS | Status: AC
  Administered 2015-01-18: 10 [IU] via SUBCUTANEOUS

## 2015-01-18 MED ORDER — CIPROFLOXACIN HCL 500 MG PO TABS: 500.0000 mg | ORAL_TABLET | Freq: Two times a day (BID) | ORAL | Status: DC

## 2015-01-18 NOTE — Progress Notes (Signed)
Patient here for follow up and to check a wound To the bottom of his right foot  Patient states he did not realize he had a tack inside his shoe and caused Him to have like a blister which is now an open wound

## 2015-01-18 NOTE — Patient Instructions (Signed)
They will call you for a referral to Orthopedics  Please go and get xray of foot today.   You will take 2 antibiotics for wound infection for 10 days.

## 2015-01-18 NOTE — Progress Notes (Signed)
Patient ID: Melvin [name], male   DOB: 1961/04/30, 54 y.o.   MRN: [id number]  CC: diabetic foot ulcer  HPI: Melvin [name] is a 54 y.o. male here today for a follow up visit.  Patient has past medical history of T2DM, CHF, HTN, Patient reports that over one week ago he noticed a sore on his right ball of foot. He reports that due to his diabetic neuropathy he did not notice a thumb tack in his shoe. He states that the area has been draining yellow discharge and he has been keeping the area clean with bacitracin ointment. He reports that it does not hurt but his foot has begun to swell. He denies fever, chills, nausea, vomiting.   Patient has No headache, No chest pain, No abdominal pain - No Nausea, No new weakness tingling or numbness, No Cough - SOB.  No Known Allergies Past Medical History  Diagnosis Date  . Diabetes mellitus   . Meningitis   . Pneumonia   . GERD (gastroesophageal reflux disease)   . Pancreatitis   . Angina   . CHF (congestive heart failure)   . Hypertension    Current Outpatient Prescriptions on File Prior to Visit  Medication Sig Dispense Refill  . aspirin 81 MG EC tablet Take 1 tablet (81 mg total) by mouth daily. 30 tablet 3  . atorvastatin (LIPITOR) 10 MG tablet Take 1 tablet (10 mg total) by mouth daily at 6 PM. 30 tablet 3  . gabapentin (NEURONTIN) 300 MG capsule Take 2 capsules (600 mg total) by mouth 2 (two) times daily. (Patient taking differently: Take 300 mg by mouth 2 (two) times daily. ) 120 capsule 3  . insulin glargine (LANTUS) 100 UNIT/ML injection Inject 0.18 mLs (18 Units total) into the skin daily. 10 mL 3  . metFORMIN (GLUCOPHAGE) 500 MG tablet Take 2 tablets (1,000 mg total) by mouth 2 (two) times daily with a meal. 120 tablet 2  . Blood Glucose Monitoring Suppl (TRUE METRIX METER) W/DEVICE KIT 1 each by Does not apply route 4 (four) times daily -  before meals and at bedtime. 1 kit 0  . glucose blood (TRUE METRIX BLOOD GLUCOSE TEST) test  strip Use as instructed 100 each 5  . sildenafil (VIAGRA) 50 MG tablet Take 1 tablet (50 mg total) by mouth daily as needed for erectile dysfunction. (Patient not taking: Reported on 12/13/2014) 10 tablet 1  . TRUEPLUS LANCETS 28G MISC 1 each by Does not apply route 4 (four) times daily -  before meals and at bedtime. 100 each 5   Current Facility-Administered Medications on File Prior to Visit  Medication Dose Route Frequency Provider Last Rate Last Dose  . TRUE METRIX METER KIT 1 each  1 each Does not apply TID PC & HS Arnoldo Morale, MD       Family History  Problem Relation Age of Onset  . Heart failure Mother   . Diabetes Mother   . Hypertension Mother   . Heart disease Father   . Heart failure Father    Social History   Social History  . Marital Status: Single    Spouse Name: N/A  . Number of Children: N/A  . Years of Education: N/A   Occupational History  . Not on file.   Social History Main Topics  . Smoking status: Never Smoker   . Smokeless tobacco: Never Used  . Alcohol Use: 2.4 oz/week    4 Shots of liquor per week  Comment: Past daily use; ocassional beer  . Drug Use: No     Comment: "age 51 I smoked crack"; last cocaine use last month.  . Sexual Activity: Not on file   Other Topics Concern  . Not on file   Social History Narrative    Review of Systems: Other than what is stated in HPI, all other systems are negative.   Objective:   Filed Vitals:   01/18/15 0932  BP: 126/85  Pulse: 86  Temp: 98.3 F (36.8 C)  Resp: 15    Physical Exam  Cardiovascular: Normal rate, regular rhythm and normal heart sounds.   Pulmonary/Chest: Effort normal and breath sounds normal.  Musculoskeletal: He exhibits edema (right foot).  Feet:  Right Foot:  Skin Integrity: Positive for ulcer and warmth. Negative for erythema.  Skin: Skin is warm and dry.  Boggy skin area surrounding diabetic ulcer on ball of foot.       Lab Results  Component Value Date   WBC  4.7 10/25/2014   HGB 11.6* 10/25/2014   HCT 33.8* 10/25/2014   MCV 90.9 10/25/2014   PLT 189 10/25/2014   Lab Results  Component Value Date   CREATININE 1.14 12/13/2014   BUN 15 12/13/2014   NA 134* 12/13/2014   K 4.2 12/13/2014   CL 103 12/13/2014   CO2 23 12/13/2014    Lab Results  Component Value Date   HGBA1C 11.4 12/13/2014   Lipid Panel     Component Value Date/Time   CHOL 163 10/25/2014 0250   TRIG 64 10/25/2014 0250   HDL 95 10/25/2014 0250   CHOLHDL 1.7 10/25/2014 0250   VLDL 13 10/25/2014 0250   LDLCALC 55 10/25/2014 0250       Assessment and plan:   Melvin [name] was seen today for wound check.  Diagnoses and all orders for this visit:  Diabetic ulcer of right foot associated with type 2 diabetes mellitus -     Ambulatory referral to Orthopedic Surgery -     clindamycin (CLEOCIN) 300 MG capsule; Take 1 capsule (300 mg total) by mouth 2 (two) times daily. -     ciprofloxacin (CIPRO) 500 MG tablet; Take 1 tablet (500 mg total) by mouth 2 (two) times daily. -     DG Foot Complete Right; Future I will begin patient on dual antibiotic therapy for 10 days and get xray to r/o osteomyelitis.   Diabetes mellitus type 2, uncontrolled -     Glucose (CBG) -     insulin aspart (novoLOG) injection 10 Units; Inject 0.1 mLs (10 Units total) into the skin once in office Patient ate 2 hours before appointment. He only has 4 days of sugars written down, so again I am unable to make changes to regimen. I will have him come back for wound check and log review at that time.   Need for Tdap vaccination -     Tdap vaccine greater than or equal to 7yo IM   Return for 7-10 days wound check with PCP and 2 mo DM f/u.       Lance Bosch, [facility] [phone] 01/18/2015, 10:00 AM

## 2015-01-19 ENCOUNTER — Telehealth: Payer: Self-pay

## 2015-01-19 NOTE — Telephone Encounter (Signed)
-----   Message from Lance Bosch, NP sent at 01/19/2015  9:05 AM EDT ----- No bone infection so hopefully infection will clear with the antibiotics. He should be hearing from Orthopedics soon

## 2015-01-19 NOTE — Telephone Encounter (Signed)
Spoke with patient and he is aware of his x ray results Patient stated ortho does not handle open wounds so they are  Supposed to call and let us know where patient should be referred  For the wound to his foot

## 2015-01-23 ENCOUNTER — Encounter: Payer: Self-pay | Admitting: Pharmacist

## 2015-01-23 ENCOUNTER — Encounter: Payer: Medicaid Other | Admitting: Pharmacist

## 2015-01-25 ENCOUNTER — Ambulatory Visit: Payer: Medicaid Other | Attending: Internal Medicine | Admitting: Pharmacist

## 2015-01-25 DIAGNOSIS — Z794 Long term (current) use of insulin: Secondary | ICD-10-CM | POA: Insufficient documentation

## 2015-01-25 DIAGNOSIS — IMO0002 Reserved for concepts with insufficient information to code with codable children: Secondary | ICD-10-CM

## 2015-01-25 DIAGNOSIS — E1165 Type 2 diabetes mellitus with hyperglycemia: Secondary | ICD-10-CM | POA: Diagnosis not present

## 2015-01-25 DIAGNOSIS — E11621 Type 2 diabetes mellitus with foot ulcer: Secondary | ICD-10-CM | POA: Diagnosis present

## 2015-01-25 DIAGNOSIS — L97519 Non-pressure chronic ulcer of other part of right foot with unspecified severity: Secondary | ICD-10-CM | POA: Insufficient documentation

## 2015-01-25 MED ORDER — INSULIN GLARGINE 100 UNIT/ML ~~LOC~~ SOLN: 21.0000 [IU] | Freq: Every day | SUBCUTANEOUS | Status: DC

## 2015-01-25 NOTE — Progress Notes (Signed)
Patient ID: Melvin [name], male   DOB: 04/26/1961, 54 y.o.   MRN: [id number]  S:    Patient arrives in good spirits with his BG log. Presents for diabetes evaluation and foot wound check.   Patient reports adherence with medications. Current diabetes medications include Metformin 1000 mg BID and lantus 18 units.  Patient denies hypoglycemic events.  Patient reports nocturia 1x/night but has decreased recently. Patient reports neuropathy. Patient reports visual changes but saw a optomotrist/ophthamologist on 8/16.   SMBG: ACB 130-254.  ACL: 197-300 ACS: 212-312  Foot wound has shown improvement per Chari Manning NP.  Pt states he is practicing proper wound care.   O:  . Lab Results  Component Value Date   HGBA1C 11.4 12/13/2014     A/P: Diabetes currently uncontrolled based on home readings.  Denies hypoglycemic events and is able to verbalize appropriate hypoglycemia management plan. Reports adherence with medication. Pt wound is showing improvement per Chari Manning, NP.   Increased dose of basal insulin Lantus (insulin glargine) to 21 units daily. Instructed pt to maintain proper wound and foot care and to call if worsening s/sx of infection. Counseled on s/sx of hypoglycemia and treatment.   Next A1C anticipated 03/2015.  Written patient instructions provided.  Follow up in clinic in 2 weeks for wound check and CBG log check.   Total time in face to face counseling 30 minutes.  Patient seen with Bennye Alm, PharmD, Pharmacy Resident.   Nicoletta Ba, PharmD, [facility]

## 2015-01-25 NOTE — Patient Instructions (Signed)
Increase lantus to 21 units at bedtime.  Please contact clinic if signs/symptoms of worsening infection or if you have frequent low blood sugars. Please continue to maintain proper wound care for your foot.   Thank you for coming in today.

## 2015-01-29 ENCOUNTER — Telehealth: Payer: Self-pay | Admitting: Internal Medicine

## 2015-01-29 NOTE — Telephone Encounter (Signed)
Patient called requesting medication refill for new glucometer and test strips, patient would like medication sent to [facility] on [address]. Please f/u

## 2015-01-31 ENCOUNTER — Telehealth: Payer: Self-pay

## 2015-01-31 MED ORDER — TRUE METRIX METER W/DEVICE KIT: 1.0000 | PACK | Freq: Three times a day (TID) | Status: DC

## 2015-01-31 MED ORDER — GLUCOSE BLOOD VI STRP: ORAL_STRIP | Status: DC

## 2015-01-31 MED ORDER — TRUEPLUS LANCETS 28G MISC: 1.0000 | Freq: Three times a day (TID) | Status: DC

## 2015-01-31 NOTE — Telephone Encounter (Signed)
Patient called requesting medication refill for new glucometer and test strips, patient would like medication sent to [facility] on [address]. Please f/u  Patient needs it changed due to his insurance.

## 2015-01-31 NOTE — Telephone Encounter (Signed)
Patient called requesting prescription for new glucometer and supplies Prescription sent to [facility] on [address] per patient request

## 2015-02-07 ENCOUNTER — Telehealth: Payer: Self-pay

## 2015-02-07 NOTE — Telephone Encounter (Signed)
Nurse called patient, patient verified date of birth. Patient had called and left message on nurses voicemail requesting glucometer and test strips. Patient explains he has received his glucometer and test strips and has no other needs at this time. Patient questioned if he has appointment tomorrow.  Nurse verified appointment tomorrow at 11am.  Patient voices understanding and has no further questions at this time.

## 2015-02-08 ENCOUNTER — Ambulatory Visit: Payer: Medicaid Other | Attending: Internal Medicine | Admitting: Pharmacist

## 2015-02-08 DIAGNOSIS — L97519 Non-pressure chronic ulcer of other part of right foot with unspecified severity: Secondary | ICD-10-CM

## 2015-02-08 DIAGNOSIS — E1165 Type 2 diabetes mellitus with hyperglycemia: Secondary | ICD-10-CM | POA: Insufficient documentation

## 2015-02-08 DIAGNOSIS — E11621 Type 2 diabetes mellitus with foot ulcer: Secondary | ICD-10-CM | POA: Insufficient documentation

## 2015-02-08 DIAGNOSIS — IMO0002 Reserved for concepts with insufficient information to code with codable children: Secondary | ICD-10-CM

## 2015-02-08 DIAGNOSIS — Z794 Long term (current) use of insulin: Secondary | ICD-10-CM | POA: Diagnosis not present

## 2015-02-08 NOTE — Patient Instructions (Signed)
It was great to see you today Melvin [name]!  Your goal blood sugars in the morning when you wake up are 90-120 and after you eat less than 180.  Increase your Lantus to 23 units every day.   Remember to eat so you don't get low - even if you aren't hungry, try to have three meals a day.  Remember to bring your log and meter to every visit   Keep your wound clean and let us know immediately if it gets worse

## 2015-02-08 NOTE — Progress Notes (Signed)
S:    Patient arrives in good spirits, with a boot on his right foot for his wound.    Presents for diabetes follow up and a wound check.   Wound is still open, but patient reports cleaning it and keeping it covered. He reports that he feels like it is getting better.   Patient reports adherence with medications. Current diabetes medications include Lantus 21 units daily and metformin 1000 mg BID.  Patient reports hypoglycemic events. Both readings were in the 70s and occurred in the morning (fasting). He reports that he treats hypoglycemic episodes with orange juice or hard candies.  Patient reported dietary habits: he reports that he is sometimes skipping meals.  Patient reported exercise habits: unable to exercise right now due to wound on foot.   Patient reports nocturia about twice a night. Patient reports neuropathy but reports that it has not recently changed. Patient denies visual changes and is supposed to go for eye surgery 02/19/15.    O:  . Lab Results  Component Value Date   HGBA1C 11.4 12/13/2014    Patient did not have a log or meter with him Home fasting CBG: two reported readings in the 70s, mostly 200s 2 hour post-prandial/random CBG: mostly 200s  A/P: Diabetes currently uncontrolled based on A1c of 11.4 and home CBGS in the 200s (per report).  Reports hypoglycemic events (which resulted when patient did not eat) and is able to verbalize appropriate hypoglycemia management plan. Patient to eat three meals a day to prevent future episodes of hypoglycemia. Reports adherence with medication. Control is suboptimal due to dietary indiscretion, lack of physical activity, and foot infection.  Increased dose of basal insulin Lantus (insulin glargine) to 23 units daily.  Will be more conservative in titration as I do not have log but per his report most readings >200 and I want to get his blood sugars more controlled prior to eye surgery. Continue metformin 1000 mg BID. Stressed  importance of bringing meter and log to each visit so we can adjust his insulin as needed.  Patient was unable to wait to have Chari Manning, NP, perform wound check as he had to leave. Wound looks about the same as it did at the last check. Still has some oozing as evidenced on bandage. Pain has improved per patient. Sharman Crate, RN, checked wound and I assisted patient in wrapping it. I instructed patient to return sooner if the wound got worse, or he had any chills, fever, etc.   Next A1C anticipated October 2016.  Written patient instructions provided.  Follow up in Pharmacist Clinic Visit in one week for blood glucose log check. Total time in face to face counseling 20 minutes.     Nicoletta Ba, PharmD, BCPS, [facility] [phone]

## 2015-02-08 NOTE — Progress Notes (Signed)
Call patient and find out if he ever went to Orthopedics and what was the result. Does the wound look better

## 2015-03-08 ENCOUNTER — Ambulatory Visit: Payer: Medicaid Other | Attending: Internal Medicine | Admitting: Internal Medicine

## 2015-03-08 ENCOUNTER — Encounter: Payer: Self-pay | Admitting: Internal Medicine

## 2015-03-08 VITALS — BP 130/78 | HR 72 | Temp 98.6°F | Resp 16 | Ht 68.0 in | Wt 160.0 lb

## 2015-03-08 DIAGNOSIS — L97509 Non-pressure chronic ulcer of other part of unspecified foot with unspecified severity: Secondary | ICD-10-CM

## 2015-03-08 DIAGNOSIS — R739 Hyperglycemia, unspecified: Secondary | ICD-10-CM | POA: Diagnosis not present

## 2015-03-08 DIAGNOSIS — E11621 Type 2 diabetes mellitus with foot ulcer: Secondary | ICD-10-CM | POA: Diagnosis not present

## 2015-03-08 DIAGNOSIS — L97519 Non-pressure chronic ulcer of other part of right foot with unspecified severity: Secondary | ICD-10-CM | POA: Insufficient documentation

## 2015-03-08 LAB — POCT URINALYSIS DIPSTICK
Bilirubin, UA: NEGATIVE
GLUCOSE UA: 500
LEUKOCYTES UA: NEGATIVE
Nitrite, UA: NEGATIVE
Protein, UA: NEGATIVE
Urobilinogen, UA: 0.2
pH, UA: 5

## 2015-03-08 LAB — GLUCOSE, POCT (MANUAL RESULT ENTRY)
POC GLUCOSE: 315 mg/dL — AB (ref 70–99)
POC Glucose: 467 mg/dl — AB (ref 70–99)

## 2015-03-08 LAB — POCT GLYCOSYLATED HEMOGLOBIN (HGB A1C): Hemoglobin A1C: 10.2

## 2015-03-08 MED ORDER — INSULIN ASPART 100 UNIT/ML ~~LOC~~ SOLN
20.0000 [IU] | Freq: Once | SUBCUTANEOUS | Status: AC
  Administered 2015-03-08: 20 [IU] via SUBCUTANEOUS

## 2015-03-08 MED ORDER — INSULIN ASPART 100 UNIT/ML ~~LOC~~ SOLN: SUBCUTANEOUS | Status: DC

## 2015-03-08 NOTE — Patient Instructions (Signed)
STOP METFORMIN!!! Drink plenty of water today

## 2015-03-08 NOTE — Progress Notes (Signed)
Patient ID: Melvin [name], male   DOB: Oct 15, 1960, 54 y.o.   MRN: [id number] SUBJECTIVE: 54 y.o. male for follow up of diabetes and right foot wound. Patient reports that he finish all of his antibiotics for his diabetic ulcer and has noticed great improvement. Patient Diabetic Review of Systems - medication compliance: compliant most of the time, diabetic diet compliance: noncompliant much of the time, home glucose monitoring: is performed regularly, fasting values range 120-427, further diabetic ROS: no polyuria or polydipsia, no chest pain, dyspnea or TIA's, has dysesthesias in the feet.  Other symptoms and concerns: Reports that he ate a cheeseburger and fries last night and woke up with a cbg of 427. He later ate sausage and eggs before coming in today.  Current Outpatient Prescriptions  Medication Sig Dispense Refill  . aspirin 81 MG EC tablet Take 1 tablet (81 mg total) by mouth daily. 30 tablet 3  . atorvastatin (LIPITOR) 10 MG tablet Take 1 tablet (10 mg total) by mouth daily at 6 PM. 30 tablet 3  . Blood Glucose Monitoring Suppl (TRUE METRIX METER) W/DEVICE KIT 1 each by Does not apply route 4 (four) times daily -  before meals and at bedtime. 1 kit 0  . gabapentin (NEURONTIN) 300 MG capsule Take 2 capsules (600 mg total) by mouth 2 (two) times daily. (Patient taking differently: Take 300 mg by mouth 2 (two) times daily. ) 120 capsule 3  . glucose blood (TRUE METRIX BLOOD GLUCOSE TEST) test strip Use as instructed 100 each 5  . insulin glargine (LANTUS) 100 UNIT/ML injection Inject 0.21 mLs (21 Units total) into the skin daily. 10 mL 3  . metFORMIN (GLUCOPHAGE) 500 MG tablet Take 2 tablets (1,000 mg total) by mouth 2 (two) times daily with a meal. 120 tablet 2  . sildenafil (VIAGRA) 50 MG tablet Take 1 tablet (50 mg total) by mouth daily as needed for erectile dysfunction. (Patient not taking: Reported on 12/13/2014) 10 tablet 1  . TRUEPLUS LANCETS 28G MISC 1 each by Does not apply route 4  (four) times daily -  before meals and at bedtime. 100 each 5   Current Facility-Administered Medications  Medication Dose Route Frequency Provider Last Rate Last Dose  . TRUE METRIX METER KIT 1 each  1 each Does not apply TID PC & HS Arnoldo Morale, MD        OBJECTIVE: Appearance: alert, well appearing, and in no distress, oriented to person, place, and time and normal appearing weight. BP 130/78 mmHg  Pulse 72  Temp(Src) 98.6 F (37 C)  Resp 16  Ht 5' 8"  (1.727 m)  Wt 160 lb (72.576 kg)  BMI 24.33 kg/m2  SpO2 100%  Exam: heart sounds normal rate, regular rhythm, normal S1, S2, no murmurs, rubs, clicks or gallops, no JVD, chest clear, no carotid bruits, feet: warm, good capillary refill, normal DP and PT pulses, reduced sensation throughout bilateral feet and ulceration at right ball of foot  Melvin [name] was seen today for follow-up.  Diagnoses and all orders for this visit:  Type 2 diabetes mellitus with foot ulcer -     Glucose (CBG) -     HgB A1c -     Urinalysis Dipstick -     insulin aspart (novoLOG) injection 20 Units; Inject 0.2 mLs (20 Units total) into the skin once. -     Flu Vaccine QUAD 36+ mos PF IM (Fluarix & Fluzone Quad PF) -     Glucose (CBG) -  insulin aspart (NOVOLOG) 100 UNIT/ML injection; 150-200, give 3 units 201-250 give 5 units 251-300 give 7 units 301-350 give 9 units 351-400 give 12 units His diabetes remains uncontrolled with elevated sugars in the 400's and aA1c of 10.2. I have stressed long term complications of DM including kidney damage. He will stop Metformin and continue on same Lantus dose due to have some fasting sugars of 67 upon awakening. I have added meal time insulin to his regimen today. Patient will return in 2 weeks with Pharmacist. Insulin regimen may be titrated at that time  Hyperglycemia IV insertion attempted in office 3 times but unsuccessful due to a cbg of 467. Patient was then given several cups of water to stay hydrated and  get sugar to drop. He was given 20 units of insulin in office and urine has trace ketones. I do not feel patient is in DKA. He eventually dropped to 315 before discharge. Instructed to pick up new insulin.  Diabetic ulcer of right foot associated with type 2 diabetes mellitus Healing area. Completed scabbed over.   Total time spent with patient was 30 minutes. > 50% spent counseling and stabilizing patients sugar before discharge.  Return in about 2 weeks (around 03/22/2015) for Nurse Visit-log review and 3 mo PCP .  Lance Bosch, NP  03/09/2015 3:54 PM

## 2015-03-08 NOTE — Progress Notes (Signed)
Patient here for follow up on his diabetes and for a wound  Check to the bottom of his right foot Also complains of neck pain and shoulder pain

## 2015-04-12 ENCOUNTER — Ambulatory Visit: Payer: Medicaid Other | Attending: Internal Medicine | Admitting: Pharmacist

## 2015-04-12 DIAGNOSIS — L97509 Non-pressure chronic ulcer of other part of unspecified foot with unspecified severity: Secondary | ICD-10-CM

## 2015-04-12 DIAGNOSIS — E11621 Type 2 diabetes mellitus with foot ulcer: Secondary | ICD-10-CM | POA: Diagnosis present

## 2015-04-12 DIAGNOSIS — Z794 Long term (current) use of insulin: Secondary | ICD-10-CM | POA: Diagnosis not present

## 2015-04-12 NOTE — Patient Instructions (Signed)
Thank you for coming to see me!  Keep taking Lantus 21 units every day  Take the Novolog sliding scale with EVERY meal but don't take it if you don't eat!  Come back and see me or Mateo Flow in two weeks

## 2015-04-12 NOTE — Progress Notes (Signed)
S:    Patient arrives in good spirits.  Presents for diabetes follow up and a wound check.   Wound is healing and patient reports cleaning it and keeping it covered.   Patient reports adherence with medications. Current diabetes medications include Lantus 21 units daily and metformin 1000 mg BID. He reports taking the Novolog only in the morning. He was afraid to take it more than that due to taking the Lantus in the evening.   Patient denies hypoglycemic reading. He reports that he treats hypoglycemic episodes with orange juice or hard candies.  Patient reported dietary habits: he reports that he is sometimes skipping meals.  Patient reported exercise habits: unable to exercise right now due to wound on foot.   Patient reports nocturia about once a night. Patient reports neuropathy but reports that it has not recently changed. Patient denies visual changes. Patient reports checking his feet daily for new wounds.   Patient reports cramping in his neck. He is worried that it may be related to the tube that was placed in his left ear when he was younger.    O:  . Lab Results  Component Value Date   HGBA1C 10.20 03/08/2015    Home fasting CBG: 90-190, one excursion to 250 2 hour post-prandial/random CBG: 120 - 310, mostly 200s  A/P: Diabetes currently uncontrolled based on A1c of 10.2 and home CBGS in the 200s.  Denies hypoglycemic events and is able to verbalize appropriate hypoglycemia management plan. Reports adherence with medication but he was not taking Novolog correctly. Control is suboptimal due to dietary indiscretion, lack of physical activity, and incorrect Novolog use.   Continued basal insulin Lantus (insulin glargine) 21 units daily and metformin 1000 mg BID. Educated patient on basal vs bolus insulin and how the Lantus and Novolog work. Continued Novolog but instructed patient to take it with each meal. He was instructed to skip it if he skips a meal. Patient was able to  repeat these instructions and verbalized understanding. Provided patient with more CBG log sheets.   Wound is much better and healing well. It is much smaller and closed. Patient to continue to monitor wound and keep the area clean and dry.   Neck cramping: patient to follow up with Mateo Flow if cramping continues.  Next A1C anticipated January 2017.  Written patient instructions provided.  Follow up in Pharmacist Clinic Visit in 2 weeks for blood glucose log check. Total time in face to face counseling 20 minutes.     Nicoletta Ba, PharmD, BCPS, [facility] [phone]

## 2015-05-01 ENCOUNTER — Encounter: Payer: Medicaid Other | Admitting: Pharmacist

## 2015-05-10 ENCOUNTER — Ambulatory Visit: Payer: Medicaid Other | Attending: Internal Medicine | Admitting: Pharmacist

## 2015-05-10 ENCOUNTER — Telehealth: Payer: Self-pay

## 2015-05-10 ENCOUNTER — Telehealth: Payer: Self-pay | Admitting: Internal Medicine

## 2015-05-10 DIAGNOSIS — L97509 Non-pressure chronic ulcer of other part of unspecified foot with unspecified severity: Secondary | ICD-10-CM | POA: Diagnosis not present

## 2015-05-10 DIAGNOSIS — E11621 Type 2 diabetes mellitus with foot ulcer: Secondary | ICD-10-CM | POA: Diagnosis not present

## 2015-05-10 DIAGNOSIS — E119 Type 2 diabetes mellitus without complications: Secondary | ICD-10-CM

## 2015-05-10 DIAGNOSIS — Z794 Long term (current) use of insulin: Secondary | ICD-10-CM | POA: Diagnosis not present

## 2015-05-10 MED ORDER — INSULIN GLARGINE 100 UNIT/ML ~~LOC~~ SOLN: 24.0000 [IU] | Freq: Every day | SUBCUTANEOUS | Status: DC

## 2015-05-10 NOTE — Addendum Note (Signed)
Addended by: Nicoletta Ba A on: 05/10/2015 04:33 PM   Modules accepted: Orders

## 2015-05-10 NOTE — Telephone Encounter (Signed)
Patient called requesting  An RX for a shower chair due to the fact he is having Some difficulty showering Patient will be in this afternoon and will pick it up

## 2015-05-10 NOTE — Telephone Encounter (Signed)
Patient stated that he is having trouble standing up in the shower and would like to have a bath seat ordered. This is due to his diabetic health condition. Please follow up with pt thank you.

## 2015-05-10 NOTE — Patient Instructions (Signed)
Thanks for coming to see me!  Start taking Lantus 24 units every day.  Take the Novolog when you eat. Remember to skip it if you don't eat!  It is time to make an appointment with Mateo Flow - next visit with her.

## 2015-05-10 NOTE — Progress Notes (Signed)
S:    Patient arrives in good spirits.  Presents for diabetes follow up.   Patient reports adherence with medications. Current diabetes medications include Lantus 21 units and Novolog sliding scale.   Patient denies hypoglycemic events.  Patient reported dietary habits: didn't eat a much around Thanksgiving because he was sick.  Patient reported exercise habits: doesn't exercise.   Patient denies nocturia.  Patient reports neuropathy. Patient reports chronic visual changes. Patient reports self foot exams. His last wound has healed and he continues to check his feet daily for any new wounds.    O:  Lab Results  Component Value Date   HGBA1C 10.20 03/08/2015    Home fasting CBG: 93 - 210, one excursion to 290 2 hour post-prandial/random CBG: 98 - 325  A/P: Diabetes currently UNcontrolled based on A1c of 10.2 and home CBG readings.   Patient denies hypoglycemic events and is able to verbalize appropriate hypoglycemia management plan.  Patient reports adherence with medication. Control is suboptimal due to inadequate insulin dosing and dietary indiscretion - his blood glucose was much better (90s-130s) when he was sick and not eating as much.  Increase Lantus to 24 units daily. Continue Novolog sliding scale when he eats. Reminded patient to not take Novolog if he doesn't eat but to always take the Lantus. Patient verbalized understanding. Provided patient with new blood glucose log sheets. Goal will be to get the fasting blood glucoses to goal (<120) and then focus on post-prandials.   Next A1C anticipated after June 07, 2015.    Written patient instructions provided.  Total time in face to face counseling 20 minutes.   Follow up in Pharmacist Clinic Visit as needed, next visit with Chari Manning, NP (due for 3 mo PCP visit).

## 2015-05-11 ENCOUNTER — Telehealth: Payer: Self-pay

## 2015-05-11 ENCOUNTER — Telehealth: Payer: Self-pay | Admitting: Internal Medicine

## 2015-05-11 NOTE — Telephone Encounter (Signed)
Returned patient phone call Patient was inquiring about the RX for shower chair and if it had been signed Message routed to provider

## 2015-05-11 NOTE — Telephone Encounter (Signed)
Pt. Called requesting to know if his Rx for a shower chair is ready. Please f/u with pt.

## 2015-05-11 NOTE — Telephone Encounter (Signed)
Patient called requesting a phone call from nurse. Please follow up.

## 2015-05-11 NOTE — Telephone Encounter (Signed)
Patient called questioning about an order for a Shower Chair

## 2015-05-11 NOTE — Telephone Encounter (Signed)
Why does he need a shower chair. I have nothing in my note from last visit to justify this. Does he have problems standing because the last foot ulcer was healed

## 2015-05-14 ENCOUNTER — Telehealth: Payer: Self-pay

## 2015-05-14 NOTE — Telephone Encounter (Signed)
Message has been routed to the provider

## 2015-05-14 NOTE — Telephone Encounter (Signed)
Patient states he has been having trouble with his balance while taking His daily showers This is why he is requesting the shower chair so he does not fall

## 2015-05-14 NOTE — Telephone Encounter (Signed)
Patient called to check on the status of his shower chair, please f/u

## 2015-05-16 ENCOUNTER — Telehealth: Payer: Self-pay

## 2015-05-16 NOTE — Telephone Encounter (Signed)
Spoke with patient this pm He is aware his RX for his shower chair is ready for pick up Patient will pick up on friday

## 2015-05-18 ENCOUNTER — Ambulatory Visit: Payer: Medicaid Other | Admitting: Internal Medicine

## 2015-05-28 ENCOUNTER — Ambulatory Visit: Payer: Medicaid Other | Attending: Internal Medicine | Admitting: Internal Medicine

## 2015-05-28 ENCOUNTER — Encounter: Payer: Self-pay | Admitting: Internal Medicine

## 2015-05-28 VITALS — BP 110/76 | HR 106 | Temp 98.5°F | Resp 17 | Ht 68.0 in | Wt 165.0 lb

## 2015-05-28 DIAGNOSIS — E119 Type 2 diabetes mellitus without complications: Secondary | ICD-10-CM | POA: Diagnosis not present

## 2015-05-28 DIAGNOSIS — Z9119 Patient's noncompliance with other medical treatment and regimen: Secondary | ICD-10-CM | POA: Diagnosis not present

## 2015-05-28 DIAGNOSIS — Z794 Long term (current) use of insulin: Secondary | ICD-10-CM | POA: Diagnosis not present

## 2015-05-28 DIAGNOSIS — Z79899 Other long term (current) drug therapy: Secondary | ICD-10-CM | POA: Diagnosis not present

## 2015-05-28 DIAGNOSIS — Z7982 Long term (current) use of aspirin: Secondary | ICD-10-CM | POA: Insufficient documentation

## 2015-05-28 LAB — POCT URINALYSIS DIPSTICK
Glucose, UA: 500
KETONES UA: 15
LEUKOCYTES UA: NEGATIVE
Nitrite, UA: NEGATIVE
PH UA: 5.5
PROTEIN UA: 30
SPEC GRAV UA: 1.015
Urobilinogen, UA: 1

## 2015-05-28 LAB — POCT GLYCOSYLATED HEMOGLOBIN (HGB A1C): HEMOGLOBIN A1C: 10.5

## 2015-05-28 LAB — GLUCOSE, POCT (MANUAL RESULT ENTRY): POC Glucose: 419 mg/dl — AB (ref 70–99)

## 2015-05-28 MED ORDER — INSULIN ASPART 100 UNIT/ML ~~LOC~~ SOLN
20.0000 [IU] | Freq: Once | SUBCUTANEOUS | Status: AC
  Administered 2015-05-28: 20 [IU] via SUBCUTANEOUS

## 2015-05-28 NOTE — Progress Notes (Signed)
Patient here for follow up on his diabetes Presents in office with elevated blood sugar 20units novolog given per office protocol Patient states he did not take his insulin this afternoon

## 2015-05-28 NOTE — Progress Notes (Signed)
Patient ID: WOJCIECH WILLETTS, male   DOB: 23-Jan-1961, 54 y.o.   MRN: [id number] SUBJECTIVE: 54 y.o. male for follow up of diabetes. Diabetic Review of Systems - medication compliance: noncompliant some of the time, diabetic diet compliance: noncompliant much of the time, home glucose monitoring: is performed regularly, further diabetic ROS: no polyuria or polydipsia, no chest pain, dyspnea or TIA's, no hypoglycemia, has dysesthesias in the feet, blurry vision.  Other symptoms and concerns: Reports that he never increased his dose of insulin last visit and has remained on 21 units. States that he has never been to a endocrinologist in the past and has always had elevated sugars because he never fixed his diet. He admits to eating snack cakes and drink coke several times per day.  Current Outpatient Prescriptions  Medication Sig Dispense Refill  . aspirin 81 MG EC tablet Take 1 tablet (81 mg total) by mouth daily. 30 tablet 3  . atorvastatin (LIPITOR) 10 MG tablet Take 1 tablet (10 mg total) by mouth daily at 6 PM. 30 tablet 3  . gabapentin (NEURONTIN) 300 MG capsule Take 2 capsules (600 mg total) by mouth 2 (two) times daily. (Patient taking differently: Take 300 mg by mouth 2 (two) times daily. ) 120 capsule 3  . insulin aspart (NOVOLOG) 100 UNIT/ML injection 150-200, give 3 units 201-250 give 5 units 251-300 give 7 units 301-350 give 9 units 351-400 give 12 units 3 vial 5  . insulin glargine (LANTUS) 100 UNIT/ML injection Inject 0.24 mLs (24 Units total) into the skin daily. 10 mL 3  . Blood Glucose Monitoring Suppl (TRUE METRIX METER) W/DEVICE KIT 1 each by Does not apply route 4 (four) times daily -  before meals and at bedtime. 1 kit 0  . glucose blood (TRUE METRIX BLOOD GLUCOSE TEST) test strip Use as instructed 100 each 5  . sildenafil (VIAGRA) 50 MG tablet Take 1 tablet (50 mg total) by mouth daily as needed for erectile dysfunction. (Patient not taking: Reported on 05/28/2015) 10 tablet 1   . TRUEPLUS LANCETS 28G MISC 1 each by Does not apply route 4 (four) times daily -  before meals and at bedtime. 100 each 5   No current facility-administered medications for this visit.    OBJECTIVE: Appearance: alert, well appearing, and in no distress, oriented to person, place, and time and normal appearing weight. BP 110/76 mmHg  Pulse 106  Temp(Src) 98.5 F (36.9 C)  Resp 17  Ht _0  (1.727 m)  Wt 165 lb (74.844 kg)  BMI 25.09 kg/m2  SpO2 100%  Exam: heart sounds normal rate, regular rhythm, normal S1, S2, no murmurs, rubs, clicks or gallops, no JVD, chest clear, no hepatosplenomegaly, no carotid bruits  ASSESSMENT: Diabetes Mellitus: poorly controlled, patient poorly compliant and needs to follow diet more regularly  PLAN: See orders for this visit as documented in the electronic medical record. Issues reviewed with him: diabetic diet discussed in detail, written exchange diet given, low cholesterol diet, weight control and daily exercise discussed, foot care discussed and Podiatry visits discussed, annual eye examinations at Ophthalmology discussed, long term diabetic complications discussed and I have referred patient to Endocrinology since he is non-compliant. At his age is is high risk for complications of diabetes and I have discussed this with patient who states that he still plans to enjoy his sugary snacks.   Return in about 2 weeks (around 06/11/2015) for Nurse Visit-log review and 3 mo PCP .  Lance Bosch, NP  05/30/2015 10:16 AM

## 2015-06-13 ENCOUNTER — Other Ambulatory Visit: Payer: Medicaid Other

## 2015-06-21 ENCOUNTER — Other Ambulatory Visit: Payer: Medicaid Other

## 2015-06-26 MED FILL — ATORVASTATIN 10 MG TABLET: 10 | 30 days supply | Qty: 30 | Fill #3

## 2015-06-26 MED FILL — GABAPENTIN 300 MG CAPSULE: 300 | 30 days supply | Qty: 120 | Fill #0 | Status: TO

## 2015-07-02 MED FILL — LANTUS 100 UNITS/ML VIAL: 100 | 28 days supply | Qty: 10 | Fill #1

## 2015-07-26 ENCOUNTER — Telehealth: Payer: Self-pay | Admitting: Internal Medicine

## 2015-07-26 ENCOUNTER — Telehealth: Payer: Self-pay

## 2015-07-26 MED ORDER — "INSULIN SYRINGE 31G X 5/16"" 0.5 ML MISC": Status: DC

## 2015-07-26 NOTE — Telephone Encounter (Signed)
Returned patient phone call Patient is in need of insulin syringes rx sent to pharmacy on file

## 2015-07-26 NOTE — Telephone Encounter (Signed)
Pt. Called requesting needles for his insulin. Pt. Stated he is completely out of needles. Please f/u

## 2015-09-03 ENCOUNTER — Encounter: Payer: Self-pay | Admitting: Internal Medicine

## 2015-09-03 ENCOUNTER — Ambulatory Visit: Payer: Medicaid Other | Attending: Internal Medicine | Admitting: Internal Medicine

## 2015-09-03 VITALS — BP 121/86 | HR 109 | Temp 98.2°F | Resp 16 | Ht 68.0 in | Wt 165.2 lb

## 2015-09-03 DIAGNOSIS — E11621 Type 2 diabetes mellitus with foot ulcer: Secondary | ICD-10-CM | POA: Diagnosis not present

## 2015-09-03 DIAGNOSIS — H538 Other visual disturbances: Secondary | ICD-10-CM | POA: Insufficient documentation

## 2015-09-03 DIAGNOSIS — R000 Tachycardia, unspecified: Secondary | ICD-10-CM | POA: Diagnosis not present

## 2015-09-03 DIAGNOSIS — Z7982 Long term (current) use of aspirin: Secondary | ICD-10-CM | POA: Insufficient documentation

## 2015-09-03 DIAGNOSIS — F1490 Cocaine use, unspecified, uncomplicated: Secondary | ICD-10-CM | POA: Diagnosis not present

## 2015-09-03 DIAGNOSIS — K859 Acute pancreatitis without necrosis or infection, unspecified: Secondary | ICD-10-CM | POA: Diagnosis not present

## 2015-09-03 DIAGNOSIS — K219 Gastro-esophageal reflux disease without esophagitis: Secondary | ICD-10-CM | POA: Insufficient documentation

## 2015-09-03 DIAGNOSIS — Z794 Long term (current) use of insulin: Secondary | ICD-10-CM | POA: Insufficient documentation

## 2015-09-03 DIAGNOSIS — I509 Heart failure, unspecified: Secondary | ICD-10-CM | POA: Insufficient documentation

## 2015-09-03 DIAGNOSIS — E785 Hyperlipidemia, unspecified: Secondary | ICD-10-CM | POA: Insufficient documentation

## 2015-09-03 DIAGNOSIS — I10 Essential (primary) hypertension: Secondary | ICD-10-CM | POA: Diagnosis present

## 2015-09-03 DIAGNOSIS — L97509 Non-pressure chronic ulcer of other part of unspecified foot with unspecified severity: Secondary | ICD-10-CM

## 2015-09-03 DIAGNOSIS — E119 Type 2 diabetes mellitus without complications: Secondary | ICD-10-CM | POA: Insufficient documentation

## 2015-09-03 DIAGNOSIS — Z79899 Other long term (current) drug therapy: Secondary | ICD-10-CM | POA: Insufficient documentation

## 2015-09-03 LAB — BASIC METABOLIC PANEL
BUN: 15 mg/dL (ref 7–25)
CO2: 23 mmol/L (ref 20–31)
Calcium: 9.3 mg/dL (ref 8.6–10.3)
Chloride: 101 mmol/L (ref 98–110)
Creat: 1.04 mg/dL (ref 0.70–1.33)
Glucose, Bld: 314 mg/dL — ABNORMAL HIGH (ref 65–99)
Potassium: 4.3 mmol/L (ref 3.5–5.3)
Sodium: 136 mmol/L (ref 135–146)

## 2015-09-03 LAB — GLUCOSE, POCT (MANUAL RESULT ENTRY): POC Glucose: 331 mg/dl — AB (ref 70–99)

## 2015-09-03 LAB — POCT GLYCOSYLATED HEMOGLOBIN (HGB A1C): Hemoglobin A1C: 10.9

## 2015-09-03 MED ORDER — METOPROLOL SUCCINATE ER 25 MG PO TB24: 12.5000 mg | ORAL_TABLET | Freq: Every day | ORAL | Status: DC

## 2015-09-03 MED ORDER — ATORVASTATIN CALCIUM 10 MG PO TABS: 10.0000 mg | ORAL_TABLET | Freq: Every day | ORAL | Status: DC

## 2015-09-03 MED ORDER — INSULIN GLARGINE 100 UNIT/ML ~~LOC~~ SOLN: 28.0000 [IU] | Freq: Every day | SUBCUTANEOUS | Status: DC

## 2015-09-03 NOTE — Progress Notes (Signed)
Patient ID: Melvin [name], male   DOB: [birth year], 55 y.o.   MRN: [id number]  CC: Follow up  HPI: Melvin [name] is a 55 y.o. male here today for a follow up visit.  Patient has past medical history of DM, blurry vision, congestive heart disease and HTN.  Patient's blood sugar is 331 in the office today but states that he just took his insulin before arriving to appointment. Patient denies any polyuria, polydipsia, numbness or tingling.  Patient states, "My blood sugars are always high."  Patient states he checks his blood sugars 3 times a day with most readings in the 300's. He reports that he was unable to see Endocrinology because of a balance he owed. He plans to pay and make a appointment when he has the chance. Patient takes all other medications without complications. Patient reports that when he was diagnosed with heart failure several years ago he was placed on a medication for his blood pressure but it caused him to drop too low and he has not required any medication since that time for his BP. Patient has No headache, No chest pain, No abdominal pain - No Nausea, No Cough - SOB.  No Known Allergies Past Medical History  Diagnosis Date  . Diabetes mellitus   . Meningitis   . Pneumonia   . GERD (gastroesophageal reflux disease)   . Pancreatitis   . Angina   . CHF (congestive heart failure) ([place])   . Hypertension    Current Outpatient Prescriptions on File Prior to Visit  Medication Sig Dispense Refill  . aspirin 81 MG EC tablet Take 1 tablet (81 mg total) by mouth daily. 30 tablet 3  . atorvastatin (LIPITOR) 10 MG tablet Take 1 tablet (10 mg total) by mouth daily at 6 PM. 30 tablet 3  . Blood Glucose Monitoring Suppl (TRUE METRIX METER) W/DEVICE KIT 1 each by Does not apply route 4 (four) times daily -  before meals and at bedtime. 1 kit 0  . gabapentin (NEURONTIN) 300 MG capsule Take 2 capsules (600 mg total) by mouth 2 (two) times daily. (Patient taking differently: Take 300  mg by mouth 2 (two) times daily. ) 120 capsule 3  . glucose blood (TRUE METRIX BLOOD GLUCOSE TEST) test strip Use as instructed 100 each 5  . insulin aspart (NOVOLOG) 100 UNIT/ML injection 150-200, give 3 units 201-250 give 5 units 251-300 give 7 units 301-350 give 9 units 351-400 give 12 units 3 vial 5  . insulin glargine (LANTUS) 100 UNIT/ML injection Inject 0.24 mLs (24 Units total) into the skin daily. 10 mL 3  . Insulin Syringe-Needle U-100 (INSULIN SYRINGE .5CC/31GX5/16") 31G X 5/16" 0.5 ML MISC Check blood sugar TID & QHS 100 each 2  . TRUEPLUS LANCETS 28G MISC 1 each by Does not apply route 4 (four) times daily -  before meals and at bedtime. 100 each 5  . sildenafil (VIAGRA) 50 MG tablet Take 1 tablet (50 mg total) by mouth daily as needed for erectile dysfunction. (Patient not taking: Reported on 05/28/2015) 10 tablet 1   No current facility-administered medications on file prior to visit.   Family History  Problem Relation Age of Onset  . Heart failure Mother   . Diabetes Mother   . Hypertension Mother   . Heart disease Father   . Heart failure Father    Social History   Social History  . Marital Status: Single    Spouse Name: N/A  . Number of Children:  N/A  . Years of Education: N/A   Occupational History  . Not on file.   Social History Main Topics  . Smoking status: Never Smoker   . Smokeless tobacco: Never Used  . Alcohol Use: 2.4 oz/week    4 Shots of liquor per week     Comment: Past daily use; ocassional beer  . Drug Use: No     Comment: "age 64 I smoked crack"; last cocaine use last month.  . Sexual Activity: Not on file   Other Topics Concern  . Not on file   Social History Narrative    Review of Systems: Constitutional: Negative for fever, chills, diaphoresis, activity change, appetite change and fatigue. HENT: Negative for ear pain, nosebleeds, congestion, facial swelling, rhinorrhea, neck pain, neck stiffness and ear discharge.  Eyes:  +blurred vision Negative for pain, discharge, redness, itching and . Respiratory: Negative for cough, choking, chest tightness, shortness of breath, wheezing and stridor.  Cardiovascular: Negative for chest pain, palpitations and leg swelling. Gastrointestinal: Negative for abdominal distention. Genitourinary: Negative for dysuria, urgency, frequency, hematuria, flank pain, decreased urine volume, difficulty urinating and dyspareunia.  Musculoskeletal: Negative for back pain, joint swelling, arthralgias and gait problem. Neurological: Negative for dizziness, tremors, seizures, syncope, facial asymmetry, speech difficulty, weakness, light-headedness, numbness and headaches.  Psychiatric/Behavioral: Negative for hallucinations, behavioral problems, confusion, dysphoric mood, decreased concentration and agitation.    Objective:   Filed Vitals:   09/03/15 1010  BP: 121/86  Pulse: 109  Temp: 98.2 F (36.8 C)  Resp: 16    Physical Exam: Constitutional: Patient appears well-developed and well-nourished. No distress. HENT: Normocephalic, atraumatic, External right and left ear normal. Oropharynx is clear and moist.  Eyes: Conjunctivae and EOM are normal.  Neck: Normal ROM. Neck supple. No JVD.  CVS: RRR, S1/S2 +, no murmurs, no gallops, no carotid bruit.  Pulmonary: Effort and breath sounds normal, no stridor, rhonchi, wheezes, rales.  Abdominal: Soft. BS +,  no distension. Musculoskeletal: Normal range of motion. No edema and no tenderness.  Lymphadenopathy: No lymphadenopathy noted, cervical, inguinal or axillary Neuro: Alert.  Skin: Skin is warm and dry. No rash noted. Not diaphoretic. No erythema. No pallor. Psychiatric: Normal mood and affect. Behavior, judgment, thought content normal.  Lab Results  Component Value Date   WBC 4.7 10/25/2014   HGB 11.6* 10/25/2014   HCT 33.8* 10/25/2014   MCV 90.9 10/25/2014   PLT 189 10/25/2014   Lab Results  Component Value Date    CREATININE 1.14 12/13/2014   BUN 15 12/13/2014   NA 134* 12/13/2014   K 4.2 12/13/2014   CL 103 12/13/2014   CO2 23 12/13/2014    Lab Results  Component Value Date   HGBA1C 10.9 09/03/2015   Lipid Panel     Component Value Date/Time   CHOL 163 10/25/2014 0250   TRIG 64 10/25/2014 0250   HDL 95 10/25/2014 0250   CHOLHDL 1.7 10/25/2014 0250   VLDL 13 10/25/2014 0250   LDLCALC 55 10/25/2014 0250       Assessment and plan:   Dareion was seen today for follow-up and hypertension.  Diagnoses and all orders for this visit:  Type 2 diabetes mellitus with foot ulcer, unspecified long term insulin use status (HCC) -     Glucose (CBG) -     POCT A1C -     insulin glargine (LANTUS) 100 UNIT/ML injection; Inject 0.28 mLs (28 Units total) into the skin daily. -     Basic Metabolic  Panel Patient's blood sugars are not well controlled.  Lantus increased by 4 units to 28 units nightly.  Patient to keep a blood sugar diary and return in 2 weeks with readings.  Lifestyle and diet modifications reviewed. We will continue to manage patient here in office until he is able to get in with Endocrinology.   Patient has eye appointment next month for blurred vision.   Tachycardia -     metoprolol succinate (TOPROL XL) 25 MG 24 hr tablet; Take 0.5 tablets (12.5 mg total) by mouth daily. Patient presents today with elevated heart rate, 109 and recheck of 111.  On previous visits heart rate was elevated, but BP's were normal.  Patient to start low dose metoprolol succinate and return in 2 weeks for follow up.   Hyperlipidemia -     atorvastatin (LIPITOR) 10 MG tablet; Take 1 tablet (10 mg total) by mouth daily at 6 PM. Continue medication as ordered.  Diet modifications addressed.  Return in about 2 weeks (around 09/17/2015) for [place]-log review/BP/HR check and 3 mo PCP.       Melissa Noon, NP-C [facility] [phone] 09/03/2015, 10:58 AM

## 2015-09-03 NOTE — Patient Instructions (Signed)
[facility] ph # [phone] address [address] [place], [place] [address] 

## 2015-09-03 NOTE — Progress Notes (Signed)
Patient's here for f/up HTN.  Patient reports feeling good.  Patient blood sugar was elevated. Patient states he just took his insulin at 9:50 am before OV.

## 2015-09-04 ENCOUNTER — Telehealth: Payer: Self-pay

## 2015-09-04 ENCOUNTER — Encounter: Payer: Self-pay | Admitting: Clinical

## 2015-09-04 NOTE — Progress Notes (Signed)
Depression screen [facility] 2/9 09/03/2015 12/13/2014 11/14/2014 11/07/2014 10/31/2014  Decreased Interest 2 0 0 0 0  Down, Depressed, Hopeless 0 0 0 0 0  PHQ - 2 Score 2 0 0 0 0  Altered sleeping 3 - - - -  Tired, decreased energy 2 - - - -  Change in appetite 3 - - - -  Feeling bad or failure about yourself  0 - - - -  Trouble concentrating 1 - - - -  Moving slowly or fidgety/restless 1 - - - -  Suicidal thoughts 0 - - - -  PHQ-9 Score 12 - - - -    GAD 7 : Generalized Anxiety Score 09/03/2015  Nervous, Anxious, on Edge 0  Control/stop worrying 0  Worry too much - different things 1  Trouble relaxing 0  Restless 0  Easily annoyed or irritable 2  Afraid - awful might happen 0  Total GAD 7 Score 3

## 2015-09-04 NOTE — Telephone Encounter (Signed)
-----   Message from Lance Bosch, NP sent at 09/04/2015 12:18 PM EDT ----- Labs normal except elevated sugar

## 2015-09-04 NOTE — Telephone Encounter (Signed)
Tried to contact patient Patient not available Message left on voice mail to return our call 

## 2015-09-05 ENCOUNTER — Telehealth: Payer: Self-pay

## 2015-09-05 ENCOUNTER — Telehealth: Payer: Self-pay | Admitting: Internal Medicine

## 2015-09-05 NOTE — Telephone Encounter (Signed)
Patient returned phone call, please f/u °

## 2015-09-05 NOTE — Telephone Encounter (Signed)
Returned patient phone call Patient verified date of birth and is aware of his normal  Lab results

## 2015-09-18 ENCOUNTER — Ambulatory Visit: Payer: Medicaid Other | Attending: Internal Medicine | Admitting: Pharmacist

## 2015-09-18 ENCOUNTER — Encounter: Payer: Self-pay | Admitting: Pharmacist

## 2015-09-18 VITALS — BP 95/67 | HR 88

## 2015-09-18 DIAGNOSIS — E11621 Type 2 diabetes mellitus with foot ulcer: Secondary | ICD-10-CM

## 2015-09-18 DIAGNOSIS — L97509 Non-pressure chronic ulcer of other part of unspecified foot with unspecified severity: Secondary | ICD-10-CM | POA: Diagnosis not present

## 2015-09-18 DIAGNOSIS — R000 Tachycardia, unspecified: Secondary | ICD-10-CM | POA: Diagnosis not present

## 2015-09-18 NOTE — Patient Instructions (Signed)
Thanks for coming in to see [place] today!  Continue all of your medications as prescribed. Come see [place] back here in 2 weeks WITH your log book of your blood sugars.

## 2015-09-18 NOTE — Progress Notes (Signed)
S:    Patient arrives in good spirits.  Presents for diabetes evaluation, education, and management at the request of Chari Manning, NP. Patient was referred on 09/03/15.  Patient was last seen by Primary Care Provider on 09/03/15.   Patient reports adherence with medications.  Current diabetes medications include: Novolog sliding scale; Lantus 28 units daily. Current hypertension medications include: Metoprolol succinate 12.5 mg daily  Patient denies hypoglycemic events.  Patient reported dietary habits: Eating regularly. Recently his mother has been in the hospital so he has been eating at the cafeteria a good amount and is also eating later than usual.  Patient reported exercise habits: Pt reports mild exercise but again with mother in the hospital it is hard for him to find time.   Patient reports nocturia.  Patient reports neuropathy. Patient denies visual changes. Patient reports self foot exams.    O:  Lab Results  Component Value Date   HGBA1C 10.9 09/03/2015   Filed Vitals:   09/18/15 1003  BP: 95/67  Pulse: 88    Home fasting CBG: 95-215  2 hour post-prandial/random CBG: 180-320.  A/P: Diabetes longstanding currently uncontrolled with A1c of 10.9. Patient denies hypoglycemic events and is able to verbalize appropriate hypoglycemia management plan. Patient reports adherence with medication. Control is suboptimal due to additional therapy needed and dietary indiscretion.  Patient was given the option of increasing Lantus to 30 units or maintaining the same medication regimen and following up in 1-2 weeks with his log book and hopefully life more stable once mother is discharged. Patient did not want to increase insulin at this time so therefore no changes were made today.  Next A1C anticipated June 2017.    Hypertension longstanding/newly diagnosed currently controlled.  Patient reports adherence with medication. Continue current medications and will continue to  monitor.  Written patient instructions provided.  Total time in face to face counseling 20 minutes.   Follow up in Pharmacist Clinic Visit with me in 2 weeks.   Patient seen with Jamie Brookes, PharmD Candidate

## 2015-10-03 ENCOUNTER — Ambulatory Visit: Payer: Medicaid Other | Admitting: Pharmacist

## 2015-10-30 ENCOUNTER — Ambulatory Visit: Payer: Medicaid Other | Attending: Internal Medicine | Admitting: Pharmacist

## 2015-10-30 ENCOUNTER — Encounter: Payer: Self-pay | Admitting: Pharmacist

## 2015-10-30 DIAGNOSIS — E11621 Type 2 diabetes mellitus with foot ulcer: Secondary | ICD-10-CM | POA: Diagnosis not present

## 2015-10-30 DIAGNOSIS — L97509 Non-pressure chronic ulcer of other part of unspecified foot with unspecified severity: Secondary | ICD-10-CM

## 2015-10-30 MED ORDER — INSULIN GLARGINE 100 UNIT/ML ~~LOC~~ SOLN: 30.0000 [IU] | Freq: Every day | SUBCUTANEOUS | Status: DC

## 2015-10-30 NOTE — Patient Instructions (Signed)
Thanks for coming to see me!  Increase your Lantus to 30 units daily.  Continue all of your other medications as prescribed.  Schedule an appointment with a new primary care provider.  Hypoglycemia Low blood sugar (hypoglycemia) means that the level of sugar in your blood is lower than it should be. Signs of low blood sugar include:  Getting sweaty.  Feeling hungry.  Feeling dizzy or weak.  Feeling sleepier than normal.  Feeling nervous.  Headaches.  Having a fast heartbeat. Low blood sugar can happen fast and can be an emergency. Your doctor can do tests to check your blood sugar level. You can have low blood sugar and not have diabetes. HOME CARE  Check your blood sugar as told by your doctor. If it is less than 70 mg/dl or as told by your doctor, take 1 of the following:  3 to 4 glucose tablets.   cup clear juice.   cup soda pop, not diet.  1 cup milk.  5 to 6 hard candies.  Recheck blood sugar after 15 minutes. Repeat until it is at the right level.  Eat a snack if it is more than 1 hour until the next meal.  Only take medicine as told by your doctor.  Do not skip meals. Eat on time.  Do not drink alcohol except with meals.  Check your blood glucose before driving.  Check your blood glucose before and after exercise.  Always carry treatment with you, such as glucose pills.  Always wear a medical alert bracelet if you have diabetes. GET HELP RIGHT AWAY IF:   Your blood glucose goes below 70 mg/dl or as told by your doctor, and you:  Are confused.  Are not able to swallow.  Pass out (faint).  You cannot treat yourself. You may need someone to help you.  You have low blood sugar problems often.  You have problems from your medicines.  You are not feeling better after 3 to 4 days.  You have vision changes. MAKE SURE YOU:   Understand these instructions.  Will watch this condition.  Will get help right away if you are not doing well or  get worse.   This information is not intended to replace advice given to you by your health care provider. Make sure you discuss any questions you have with your health care provider.   Document Released: 08/20/2009 Document Revised: 06/16/2014 Document Reviewed: 01/30/2015 Elsevier Interactive Patient Education [employer].

## 2015-10-30 NOTE — Progress Notes (Signed)
S:    Patient arrives in good spirits.  Presents for diabetes evaluation, education, and management at the request of Chari Manning, NP. Patient was referred on 09/03/15.  Patient was last seen by Primary Care Provider on 09/03/15.   Patient reports adherence with medications.  Current diabetes medications include: Novolog sliding scale; Lantus 28 units daily.  Patient denies hypoglycemic events. He had a fasting of 80 one day and that occurred after a night that he did not eat due to stomach upset.   Patient reported dietary habits: Eating regularly.  Patient reported exercise habits: Pt reports mild exercise but has issues with neuropathy and stiffness.   Patient reports nocturia.  Patient reports neuropathy. Patient denies visual changes. He is supposed to get new glasses next week.  Patient reports self foot exams. Denies any wounds.   O:  Lab Results  Component Value Date   HGBA1C 10.9 09/03/2015   There were no vitals filed for this visit.  Home fasting CBG: 80 - 220  2 hour post-prandial/random CBG: 180-339  A/P: Diabetes longstanding currently uncontrolled with A1c of 10.9. Patient denies hypoglycemic events and is able to verbalize appropriate hypoglycemia management plan. Patient reports adherence with medication. Control is suboptimal due to additional therapy needed and dietary indiscretion.  Increase Lantus to 30 units daily. Patient does not have his logbook or meter so do not want to increase the Lantus dose too much. The reading of 80 occurred after a night that he did not eat when he was sick. Most fastings have been 200 but would like to confirm based on logbook or meter. Continue all other medications as prescribed. New logbook provided.  Next A1C anticipated June 2017.    Written patient instructions provided.  Total time in face to face counseling 20 minutes.  Schedule an appointment with a new primary care provider as Chari Manning, NP, is no longer here.

## 2016-01-07 ENCOUNTER — Other Ambulatory Visit: Payer: Self-pay | Admitting: Family Medicine

## 2016-02-04 ENCOUNTER — Encounter: Payer: Self-pay | Admitting: Family Medicine

## 2016-02-04 ENCOUNTER — Ambulatory Visit: Payer: Medicaid Other | Attending: Family Medicine | Admitting: Family Medicine

## 2016-02-04 VITALS — BP 99/64 | HR 89 | Temp 97.9°F | Ht 68.0 in | Wt 155.0 lb

## 2016-02-04 DIAGNOSIS — R000 Tachycardia, unspecified: Secondary | ICD-10-CM

## 2016-02-04 DIAGNOSIS — E1149 Type 2 diabetes mellitus with other diabetic neurological complication: Secondary | ICD-10-CM | POA: Diagnosis not present

## 2016-02-04 DIAGNOSIS — Z79899 Other long term (current) drug therapy: Secondary | ICD-10-CM | POA: Insufficient documentation

## 2016-02-04 DIAGNOSIS — E1140 Type 2 diabetes mellitus with diabetic neuropathy, unspecified: Secondary | ICD-10-CM | POA: Insufficient documentation

## 2016-02-04 DIAGNOSIS — E785 Hyperlipidemia, unspecified: Secondary | ICD-10-CM | POA: Diagnosis not present

## 2016-02-04 DIAGNOSIS — I10 Essential (primary) hypertension: Secondary | ICD-10-CM | POA: Insufficient documentation

## 2016-02-04 DIAGNOSIS — Z794 Long term (current) use of insulin: Secondary | ICD-10-CM

## 2016-02-04 DIAGNOSIS — R197 Diarrhea, unspecified: Secondary | ICD-10-CM

## 2016-02-04 DIAGNOSIS — Z7982 Long term (current) use of aspirin: Secondary | ICD-10-CM | POA: Insufficient documentation

## 2016-02-04 DIAGNOSIS — I509 Heart failure, unspecified: Secondary | ICD-10-CM | POA: Insufficient documentation

## 2016-02-04 DIAGNOSIS — K219 Gastro-esophageal reflux disease without esophagitis: Secondary | ICD-10-CM | POA: Insufficient documentation

## 2016-02-04 DIAGNOSIS — Z9889 Other specified postprocedural states: Secondary | ICD-10-CM | POA: Insufficient documentation

## 2016-02-04 LAB — GLUCOSE, POCT (MANUAL RESULT ENTRY): POC GLUCOSE: 381 mg/dL — AB (ref 70–99)

## 2016-02-04 LAB — POCT GLYCOSYLATED HEMOGLOBIN (HGB A1C): HEMOGLOBIN A1C: 10.2

## 2016-02-04 MED ORDER — GABAPENTIN 300 MG PO CAPS: 600.0000 mg | ORAL_CAPSULE | Freq: Two times a day (BID) | ORAL | 3 refills | Status: DC

## 2016-02-04 MED ORDER — LISINOPRIL 2.5 MG PO TABS: 2.5000 mg | ORAL_TABLET | Freq: Every day | ORAL | 3 refills | Status: DC

## 2016-02-04 MED ORDER — ATORVASTATIN CALCIUM 10 MG PO TABS: 10.0000 mg | ORAL_TABLET | Freq: Every day | ORAL | 3 refills | Status: DC

## 2016-02-04 MED ORDER — INSULIN GLARGINE 100 UNIT/ML ~~LOC~~ SOLN: 30.0000 [IU] | Freq: Every day | SUBCUTANEOUS | 3 refills | Status: DC

## 2016-02-04 MED ORDER — INSULIN ASPART 100 UNIT/ML ~~LOC~~ SOLN: SUBCUTANEOUS | 5 refills | Status: DC

## 2016-02-04 NOTE — Progress Notes (Signed)
Parking form for signature Burned fingertips cooking Feel twice recently

## 2016-02-05 NOTE — Progress Notes (Signed)
Subjective:  Patient ID: Melvin [name], male    DOB: Dec 27, 1960  Age: 55 y.o. MRN: [id number]  CC: uncontrolled bowel movements (one month); Diabetes; and Peripheral Neuropathy (feet and hands- feel twice in the past 2 months)   HPI Melvin [name] . 55 year old male with a history of uncontrolled type 2 diabetes mellitus with A1c of 10.2, diabetic neuropathy who comes into the clinic to establish care with me.  He has been taking 24 units of Lantus rather than 30 units, has not been checking his blood sugars at home. He suffers from severe diabetic neuropathy and cannot feel his feet despite his taking gabapentin. States he had an episode where he burned the tips of his fingers. He does have an ophthalmologist-unable to recall his last visit; states he has a form of visual impairment.  Complains of diarrhea which he has had about 3 times a day with no abdominal pain for the last 2 weeks and this is associated with ingestion of sweets. Denies nausea or vomiting.  He is requesting completion of a DMV form in order to obtain a handicap placard.  Past Medical History:  Diagnosis Date  . Angina   . CHF (congestive heart failure) ([place])   . Diabetes mellitus   . GERD (gastroesophageal reflux disease)   . Hypertension   . Meningitis   . Pancreatitis   . Pneumonia     Past Surgical History:  Procedure Laterality Date  . ERCP  08/02/2011   Procedure: ENDOSCOPIC RETROGRADE CHOLANGIOPANCREATOGRAPHY (ERCP);  Surgeon: Beryle Beams, MD;  Location: [facility] ENDOSCOPY;  Service: Endoscopy;  Laterality: N/A;  . ERCP  08/22/2011   Procedure: ENDOSCOPIC RETROGRADE CHOLANGIOPANCREATOGRAPHY (ERCP);  Surgeon: Beryle Beams, MD;  Location: [facility] ENDOSCOPY;  Service: Endoscopy;  Laterality: N/A;  . TONSILLECTOMY AND ADENOIDECTOMY     "as a kid"  . TYMPANOSTOMY TUBE PLACEMENT      No Known Allergies   Outpatient Medications Prior to Visit  Medication Sig Dispense Refill  . aspirin 81 MG EC  tablet Take 1 tablet (81 mg total) by mouth daily. 30 tablet 3  . Blood Glucose Monitoring Suppl (TRUE METRIX METER) W/DEVICE KIT 1 each by Does not apply route 4 (four) times daily -  before meals and at bedtime. 1 kit 0  . glucose blood (TRUE METRIX BLOOD GLUCOSE TEST) test strip Use as instructed 100 each 5  . Insulin Syringe-Needle U-100 (INSULIN SYRINGE .5CC/31GX5/16") 31G X 5/16" 0.5 ML MISC Check blood sugar TID & QHS 100 each 2  . TRUEPLUS LANCETS 28G MISC 1 each by Does not apply route 4 (four) times daily -  before meals and at bedtime. 100 each 5  . atorvastatin (LIPITOR) 10 MG tablet Take 1 tablet (10 mg total) by mouth daily at 6 PM. 30 tablet 3  . gabapentin (NEURONTIN) 300 MG capsule TAKE 2 CAPSULES BY MOUTH TWICE A DAY 120 capsule 0  . insulin aspart (NOVOLOG) 100 UNIT/ML injection 150-200, give 3 units 201-250 give 5 units 251-300 give 7 units 301-350 give 9 units 351-400 give 12 units 3 vial 5  . insulin glargine (LANTUS) 100 UNIT/ML injection Inject 0.3 mLs (30 Units total) into the skin daily. 10 mL 3  . metoprolol succinate (TOPROL XL) 25 MG 24 hr tablet Take 0.5 tablets (12.5 mg total) by mouth daily. 30 tablet 2   No facility-administered medications prior to visit.     ROS Review of Systems  Constitutional: Negative for activity change and  appetite change.  HENT: Negative for sinus pressure and sore throat.   Eyes: Negative for visual disturbance.  Respiratory: Negative for cough, chest tightness and shortness of breath.   Cardiovascular: Negative for chest pain and leg swelling.  Gastrointestinal: Negative for abdominal distention, abdominal pain, constipation and diarrhea.  Endocrine: Negative.   Genitourinary: Negative for dysuria.  Musculoskeletal: Negative for joint swelling and myalgias.  Skin: Negative for rash.  Allergic/Immunologic: Negative.   Neurological: Positive for numbness. Negative for weakness and light-headedness.  Psychiatric/Behavioral:  Negative for dysphoric mood and suicidal ideas.    Objective:  BP 99/64 (BP Location: Right Arm, Patient Position: Sitting, Cuff Size: Large)   Pulse 89   Temp 97.9 F (36.6 C) (Oral)   Ht 5' 8"  (1.727 m)   Wt 155 lb (70.3 kg)   SpO2 98%   BMI 23.57 kg/m   BP/Weight 02/04/2016 09/18/2015 09/04/9240  Systolic BP 99 95 683  Diastolic BP 64 67 86  Wt. (Lbs) 155 - 165.2  BMI 23.57 - 25.12      Physical Exam  Constitutional: He is oriented to person, place, and time. He appears well-developed and well-nourished.  Cardiovascular: Normal rate, normal heart sounds and intact distal pulses.   No murmur heard. Pulmonary/Chest: Effort normal and breath sounds normal. He has no wheezes. He has no rales. He exhibits no tenderness.  Abdominal: Soft. Bowel sounds are normal. He exhibits no distension and no mass. There is no tenderness.  Musculoskeletal: Normal range of motion.  Neurological: He is alert and oriented to person, place, and time.     Lab Results  Component Value Date   HGBA1C 10.2 02/04/2016    Assessment & Plan:   1. Hyperlipidemia Stable - atorvastatin (LIPITOR) 10 MG tablet; Take 1 tablet (10 mg total) by mouth daily at 6 PM.  Dispense: 30 tablet; Refill: 3  2. Type 2 diabetes mellitus with other neurologic complication, with long-term current use of insulin (HCC) Uncontrolled with A1c of 10.2 due to noncompliance with medications Advised to take 30 units of Lantus rather than 24 units Diabetic diet Neuropathy is uncontrolled on Neurontin - Glucose (CBG) - HgB A1c - insulin aspart (NOVOLOG) 100 UNIT/ML injection; 150-200, give 3 units 201-250 give 5 units 251-300 give 7 units 301-350 give 9 units 351-400 give 12 units  Dispense: 3 vial; Refill: 5 - insulin glargine (LANTUS) 100 UNIT/ML injection; Inject 0.3 mLs (30 Units total) into the skin daily.  Dispense: 10 mL; Refill: 3  3. Tachycardia Unknown etiology We'll monitor  4. Diarrhea, unspecified  type He has identified that diarrhea is associated with eating sweets which I have advised him to cut back on. Encouraged to keep himself hydrated.  I have informed him that I am unable to complete his form for handicap placard today due to the fact that I am unable to justify his need for this. He informs me he does have some visual impairment and I have advised him to obtain letter from his ophthalmologist to this effect performed and be completed   Meds ordered this encounter  Medications  . atorvastatin (LIPITOR) 10 MG tablet    Sig: Take 1 tablet (10 mg total) by mouth daily at 6 PM.    Dispense:  30 tablet    Refill:  3  . gabapentin (NEURONTIN) 300 MG capsule    Sig: Take 2 capsules (600 mg total) by mouth 2 (two) times daily.    Dispense:  120 capsule    Refill:  3    Must have office visit for refills  . insulin aspart (NOVOLOG) 100 UNIT/ML injection    Sig: 150-200, give 3 units 201-250 give 5 units 251-300 give 7 units 301-350 give 9 units 351-400 give 12 units    Dispense:  3 vial    Refill:  5  . insulin glargine (LANTUS) 100 UNIT/ML injection    Sig: Inject 0.3 mLs (30 Units total) into the skin daily.    Dispense:  10 mL    Refill:  3    E11.9  . DISCONTD: lisinopril (PRINIVIL,ZESTRIL) 2.5 MG tablet    Sig: Take 1 tablet (2.5 mg total) by mouth daily.    Dispense:  30 tablet    Refill:  3  . lisinopril (PRINIVIL,ZESTRIL) 2.5 MG tablet    Sig: Take 1 tablet (2.5 mg total) by mouth daily.    Dispense:  30 tablet    Refill:  3    Discontinue metoprolol    Follow-up: Return in about 3 weeks (around 02/25/2016), or if symptoms worsen or fail to improve, for follow up on Diabetes mellitus.   Arnoldo Morale MD

## 2016-02-25 ENCOUNTER — Ambulatory Visit: Payer: Medicaid Other | Admitting: Family Medicine

## 2016-03-09 ENCOUNTER — Other Ambulatory Visit: Payer: Self-pay | Admitting: Internal Medicine

## 2016-03-12 ENCOUNTER — Ambulatory Visit: Payer: Medicaid Other | Attending: Family Medicine | Admitting: Family Medicine

## 2016-03-12 ENCOUNTER — Encounter: Payer: Self-pay | Admitting: Family Medicine

## 2016-03-12 VITALS — BP 95/61 | HR 90 | Temp 97.6°F | Ht 68.0 in | Wt 165.2 lb

## 2016-03-12 DIAGNOSIS — Z9119 Patient's noncompliance with other medical treatment and regimen: Secondary | ICD-10-CM | POA: Diagnosis not present

## 2016-03-12 DIAGNOSIS — T23001A Burn of unspecified degree of right hand, unspecified site, initial encounter: Secondary | ICD-10-CM | POA: Insufficient documentation

## 2016-03-12 DIAGNOSIS — E1140 Type 2 diabetes mellitus with diabetic neuropathy, unspecified: Secondary | ICD-10-CM | POA: Diagnosis present

## 2016-03-12 DIAGNOSIS — Z23 Encounter for immunization: Secondary | ICD-10-CM | POA: Diagnosis not present

## 2016-03-12 DIAGNOSIS — X58XXXA Exposure to other specified factors, initial encounter: Secondary | ICD-10-CM | POA: Insufficient documentation

## 2016-03-12 DIAGNOSIS — Z7982 Long term (current) use of aspirin: Secondary | ICD-10-CM | POA: Insufficient documentation

## 2016-03-12 DIAGNOSIS — E785 Hyperlipidemia, unspecified: Secondary | ICD-10-CM | POA: Diagnosis not present

## 2016-03-12 DIAGNOSIS — K219 Gastro-esophageal reflux disease without esophagitis: Secondary | ICD-10-CM | POA: Diagnosis not present

## 2016-03-12 DIAGNOSIS — Z794 Long term (current) use of insulin: Secondary | ICD-10-CM | POA: Insufficient documentation

## 2016-03-12 DIAGNOSIS — E1149 Type 2 diabetes mellitus with other diabetic neurological complication: Secondary | ICD-10-CM

## 2016-03-12 DIAGNOSIS — T23061A Burn of unspecified degree of back of right hand, initial encounter: Secondary | ICD-10-CM | POA: Diagnosis not present

## 2016-03-12 DIAGNOSIS — I509 Heart failure, unspecified: Secondary | ICD-10-CM | POA: Diagnosis not present

## 2016-03-12 DIAGNOSIS — E11319 Type 2 diabetes mellitus with unspecified diabetic retinopathy without macular edema: Secondary | ICD-10-CM | POA: Insufficient documentation

## 2016-03-12 DIAGNOSIS — I110 Hypertensive heart disease with heart failure: Secondary | ICD-10-CM | POA: Insufficient documentation

## 2016-03-12 DIAGNOSIS — E0800 Diabetes mellitus due to underlying condition with hyperosmolarity without nonketotic hyperglycemic-hyperosmolar coma (NKHHC): Secondary | ICD-10-CM

## 2016-03-12 DIAGNOSIS — E7800 Pure hypercholesterolemia, unspecified: Secondary | ICD-10-CM

## 2016-03-12 LAB — GLUCOSE, POCT (MANUAL RESULT ENTRY): POC GLUCOSE: 335 mg/dL — AB (ref 70–99)

## 2016-03-12 NOTE — Progress Notes (Signed)
Subjective:    Patient ID: Melvin [name], male    DOB: Jul 02, 1960, 55 y.o.   MRN: [id number]  HPI 55 year old male with a history of type 2 diabetes mellitus (A1c 10.2), diabetic neuropathy, hyperlipidemia who comes into the clinic for a follow-up visit. At his last office visit Lantus was increased to 30 units however he is still takes 24 units and has not been compliant with the NovoLog sliding scale because he lost the instruction sheet. Denies hypoglycemia.  Complains of severe diabetic neuropathy in his hands and feet and he suffered a burn of his right fifth finger 2 weeks ago which is healing now. He feels unstable on his feet due to not being able to "feel his feet". Sometimes ambulates with the aid of a cane and is requesting completion of a form for handicap placard. He also has diabetic retinopathy and is scheduled to have a right is surgery on 04/01/16.   No Known Allergies  Past Medical History:  Diagnosis Date  . Angina   . CHF (congestive heart failure) ([place])   . Diabetes mellitus   . GERD (gastroesophageal reflux disease)   . Hypertension   . Meningitis   . Pancreatitis   . Pneumonia     Past Surgical History:  Procedure Laterality Date  . ERCP  08/02/2011   Procedure: ENDOSCOPIC RETROGRADE CHOLANGIOPANCREATOGRAPHY (ERCP);  Surgeon: Beryle Beams, MD;  Location: [facility] ENDOSCOPY;  Service: Endoscopy;  Laterality: N/A;  . ERCP  08/22/2011   Procedure: ENDOSCOPIC RETROGRADE CHOLANGIOPANCREATOGRAPHY (ERCP);  Surgeon: Beryle Beams, MD;  Location: [facility] ENDOSCOPY;  Service: Endoscopy;  Laterality: N/A;  . TONSILLECTOMY AND ADENOIDECTOMY     "as a kid"  . TYMPANOSTOMY TUBE PLACEMENT      Current Outpatient Prescriptions on File Prior to Visit  Medication Sig Dispense Refill  . ACCU-CHEK SMARTVIEW test strip USE AS INSTRUCTED 100 each 5  . aspirin 81 MG EC tablet Take 1 tablet (81 mg total) by mouth daily. 30 tablet 3  . atorvastatin (LIPITOR) 10 MG tablet Take 1  tablet (10 mg total) by mouth daily at 6 PM. 30 tablet 3  . Blood Glucose Monitoring Suppl (TRUE METRIX METER) W/DEVICE KIT 1 each by Does not apply route 4 (four) times daily -  before meals and at bedtime. 1 kit 0  . gabapentin (NEURONTIN) 300 MG capsule Take 2 capsules (600 mg total) by mouth 2 (two) times daily. 120 capsule 3  . insulin aspart (NOVOLOG) 100 UNIT/ML injection 150-200, give 3 units 201-250 give 5 units 251-300 give 7 units 301-350 give 9 units 351-400 give 12 units 3 vial 5  . insulin glargine (LANTUS) 100 UNIT/ML injection Inject 0.3 mLs (30 Units total) into the skin daily. 10 mL 3  . Insulin Syringe-Needle U-100 (INSULIN SYRINGE .5CC/31GX5/16") 31G X 5/16" 0.5 ML MISC Check blood sugar TID & QHS 100 each 2  . lisinopril (PRINIVIL,ZESTRIL) 2.5 MG tablet Take 1 tablet (2.5 mg total) by mouth daily. 30 tablet 3  . TRUEPLUS LANCETS 28G MISC 1 each by Does not apply route 4 (four) times daily -  before meals and at bedtime. 100 each 5   No current facility-administered medications on file prior to visit.       Review of Systems Constitutional: Negative for activity change and appetite change.  HENT: Negative for sinus pressure and sore throat.   Eyes: Negative for visual disturbance.  Respiratory: Negative for cough, chest tightness and shortness of breath.  Cardiovascular: Negative for chest pain and leg swelling.  Gastrointestinal: Negative for abdominal distention, abdominal pain, constipation and diarrhea.  Endocrine: Negative.   Genitourinary: Negative for dysuria.  Musculoskeletal: Negative for joint swelling and myalgias.  Skin: See history of present illness. Allergic/Immunologic: Negative.   Neurological: Positive for numbness. Positive for unstable gait Negative for weakness and light-headedness.  Psychiatric/Behavioral: Negative for dysphoric mood and suicidal ideas.     Objective: Vitals:   03/12/16 0935  BP: 95/61  Pulse: 90  Temp: 97.6 F (36.4  C)  TempSrc: Oral  SpO2: 99%  Weight: 165 lb 3.2 oz (74.9 kg)  Height: 5' 8"  (1.727 m)      Physical Exam Constitutional: He is oriented to person, place, and time. He appears well-developed and well-nourished.  Cardiovascular: Normal rate, normal heart sounds and intact distal pulses.   No murmur heard. Pulmonary/Chest: Effort normal and breath sounds normal. He has no wheezes. He has no rales. He exhibits no tenderness.  Abdominal: Soft. Bowel sounds are normal. He exhibits no distension and no mass. There is no tenderness.  Musculoskeletal: Normal range of motion.  Neurological: He is alert and oriented to person, place, and time.  sensory loss in both feet. Skin: Scars from burn on right fifth finger.       Lab Results  Component Value Date   HGBA1C 10.2 02/04/2016    Assessment & Plan:  1. Hyperlipidemia Stable - atorvastatin (LIPITOR) 10 MG tablet; Take 1 tablet (10 mg total) by mouth daily at 6 PM.  Dispense: 30 tablet; Refill: 3  2. Type 2 diabetes mellitus with other neurologic complication, with long-term current use of insulin (HCC) Uncontrolled with A1c of 10.2 due to noncompliance with medications Advised to take 30 units of Lantus rather than 24 units Diabetic diet Neuropathy is uncontrolled on Neurontin Handicap form completed. Referred to podiatry for diabetic shoes as gait is unstable Pneumovax, flu shot administered today. - Glucose (CBG) - HgB A1c - insulin aspart (NOVOLOG) 100 UNIT/ML injection; 150-200, give 3 units 201-250 give 5 units 251-300 give 7 units 301-350 give 9 units 351-400 give 12 units  Dispense: 3 vial; Refill: 5 - insulin glargine (LANTUS) 100 UNIT/ML injection; Inject 0.3 mLs (30 Units total) into the skin daily.  Dispense: 10 mL; Refill: 3  Burn of right hand Secondary to uncontrolled diabetic neuropathy Referred for PCS services as he would need help with his ADLs.

## 2016-03-13 NOTE — Progress Notes (Signed)
Request for Independent Assessment for Personal Care Services form completed and signed by Dr. Jarold Song. Form faxed to [employer] [phone]).

## 2016-03-26 ENCOUNTER — Ambulatory Visit: Payer: Medicaid Other | Admitting: Podiatry

## 2016-04-02 ENCOUNTER — Ambulatory Visit (INDEPENDENT_AMBULATORY_CARE_PROVIDER_SITE_OTHER): Payer: Medicaid Other | Admitting: Podiatry

## 2016-04-02 ENCOUNTER — Encounter: Payer: Self-pay | Admitting: Podiatry

## 2016-04-02 DIAGNOSIS — M79609 Pain in unspecified limb: Principal | ICD-10-CM

## 2016-04-02 DIAGNOSIS — B351 Tinea unguium: Secondary | ICD-10-CM | POA: Diagnosis not present

## 2016-04-02 DIAGNOSIS — M79676 Pain in unspecified toe(s): Secondary | ICD-10-CM

## 2016-04-02 DIAGNOSIS — L608 Other nail disorders: Secondary | ICD-10-CM

## 2016-04-02 DIAGNOSIS — L603 Nail dystrophy: Secondary | ICD-10-CM

## 2016-04-02 DIAGNOSIS — E0843 Diabetes mellitus due to underlying condition with diabetic autonomic (poly)neuropathy: Secondary | ICD-10-CM

## 2016-04-02 NOTE — Progress Notes (Signed)
   Subjective:    Patient ID: [name], male    DOB: [birth year], 55 y.o.   MRN: [id number]  HPI    Review of Systems  All other systems reviewed and are negative.      Objective:   Physical Exam        Assessment & Plan:

## 2016-04-03 ENCOUNTER — Telehealth: Payer: Self-pay | Admitting: *Deleted

## 2016-04-03 DIAGNOSIS — M7052 Other bursitis of knee, left knee: Secondary | ICD-10-CM

## 2016-04-03 NOTE — Progress Notes (Signed)
Patient ID: Melvin [name], male   DOB: 03-Sep-1960, 55 y.o.   MRN: [id number] SUBJECTIVE Patient with a history of diabetes mellitus presents to office today complaining of elongated, thickened nails. Pain while ambulating in shoes. Patient is unable to trim their own nails.  Patient also complains of severe swelling and tenderness to the left knee.  No Known Allergies  OBJECTIVE General Patient is awake, alert, and oriented x 3 and in no acute distress. Derm Skin is dry and supple bilateral. Negative open lesions or macerations. Remaining integument unremarkable. Nails are tender, long, thickened and dystrophic with subungual debris, consistent with onychomycosis, 1-5 bilateral. No signs of infection noted. Vasc  DP and PT pedal pulses palpable bilaterally. Temperature gradient within normal limits.  Neuro Epicritic and protective threshold sensation diminished bilaterally.  Musculoskeletal Exam edema noted left knee with pain on palpation. No symptomatic pedal deformities noted bilateral. Muscular strength within normal limits.  ASSESSMENT 1. Diabetes Mellitus w/ peripheral neuropathy 2. Onychomycosis of nail due to dermatophyte bilateral 3. Pain in foot bilateral 4. Knee joint bursitis with edema  PLAN OF CARE 1. Patient evaluated today. 2. Instructed to maintain good pedal hygiene and foot care. Stressed importance of controlling blood sugar.  3. Mechanical debridement of nails 1-5 bilaterally performed using a nail nipper. Filed with dremel without incident.  4. Referral placed for orthopedic evaluation of left knee pain 5. Return to clinic in 3 mos.    Edrick Kins, DPM

## 2016-04-03 NOTE — Telephone Encounter (Addendum)
-----   Message from Edrick Kins, DPM sent at 04/03/2016 10:40 AM EDT ----- Regarding: Ortho referral Please arrange ortho consult  Dx: bursitis left knee. Pain in left knee. Referral, pt demographics and clinicals sent to [facility]. 04/04/2016-Marl[place] - Raliegh Ip states pt is established with Dr. Sharol Given. Faxed referral, pt demographics and clinicals to Dr. Sharol Given.

## 2016-04-17 ENCOUNTER — Ambulatory Visit (INDEPENDENT_AMBULATORY_CARE_PROVIDER_SITE_OTHER): Payer: Medicaid Other | Admitting: Orthopedic Surgery

## 2016-04-23 ENCOUNTER — Ambulatory Visit (INDEPENDENT_AMBULATORY_CARE_PROVIDER_SITE_OTHER): Payer: Medicaid Other | Admitting: Orthopedic Surgery

## 2016-04-23 ENCOUNTER — Ambulatory Visit (INDEPENDENT_AMBULATORY_CARE_PROVIDER_SITE_OTHER): Payer: Medicaid Other

## 2016-04-23 VITALS — Ht 68.0 in | Wt 163.0 lb

## 2016-04-23 DIAGNOSIS — M1712 Unilateral primary osteoarthritis, left knee: Secondary | ICD-10-CM | POA: Insufficient documentation

## 2016-04-23 DIAGNOSIS — Z794 Long term (current) use of insulin: Secondary | ICD-10-CM | POA: Diagnosis not present

## 2016-04-23 DIAGNOSIS — E119 Type 2 diabetes mellitus without complications: Secondary | ICD-10-CM

## 2016-04-23 DIAGNOSIS — M25562 Pain in left knee: Secondary | ICD-10-CM

## 2016-04-23 DIAGNOSIS — G8929 Other chronic pain: Secondary | ICD-10-CM

## 2016-04-23 MED ORDER — LIDOCAINE HCL 1 % IJ SOLN
5.0000 mL | INTRAMUSCULAR | Status: AC | PRN
  Administered 2016-04-23: 5 mL

## 2016-04-23 MED ORDER — METHYLPREDNISOLONE ACETATE 40 MG/ML IJ SUSP
40.0000 mg | INTRAMUSCULAR | Status: AC | PRN
  Administered 2016-04-23: 40 mg via INTRA_ARTICULAR

## 2016-04-23 NOTE — Progress Notes (Signed)
Office Visit Note   Patient: Melvin [name]           Date of Birth: 07/08/1960           MRN: [id number] Visit Date: 04/23/2016              Requested by: Edrick Kins, [address] [place], [place] [address] PCP: Arnoldo Morale, MD   Assessment & Plan: Visit Diagnoses:  1. Chronic pain of left knee   2. Type 2 diabetes mellitus without complication, with long-term current use of insulin ([place])   3. Unilateral primary osteoarthritis, left knee     Plan: Will follow up in the office as needed.   Follow-Up Instructions: No Follow-up on file.   Orders:  Orders Placed This Encounter  Procedures  . Large Joint Injection/Arthrocentesis  . XR Knee 1-2 Views Left   No orders of the defined types were placed in this encounter.     Procedures: Large Joint Inj Date/Time: 04/23/2016 3:19 PM Performed by: Dondra Prader RENEE Authorized by: Dondra Prader RENEE   Consent Given by:  Patient Site marked: the procedure site was marked   Timeout: prior to procedure the correct patient, procedure, and site was verified   Indications:  Pain and diagnostic evaluation Location:  Knee Site:  L knee Needle Size:  22 G Needle Length:  1.5 inches Ultrasound Guidance: No   Fluoroscopic Guidance: No   Arthrogram: No   Medications:  5 mL lidocaine 1 %; 40 mg methylPREDNISolone acetate 40 MG/ML Aspiration Attempted: No   Patient tolerance:  Patient tolerated the procedure well with no immediate complications     Clinical Data: No additional findings.   Subjective: Chief Complaint  Patient presents with  . Left Knee - Pain    NKI pain for 2 months    Patient complaints ok left knee instability and popping. He ambulates with a cane and states that he does not have any real pain or decreased ROM but weakness and constant popping se(ecially after sitting or prolonged periods of rest. He ambulates with a cane.  Mainly complains of mechanical symptoms, left knee. Mild pain,  and little swelling. States BSGs have been running over 200 despite 34 units of Lantus and shortacting insuling with meals. States has follow up in 1 more months with PCP.   Review of Systems  Constitutional: Negative for chills and fever.  Musculoskeletal: Positive for arthralgias and gait problem.     Objective: Vital Signs: Ht 5\' 8"  (1.727 m)   Wt 163 lb (73.9 kg)   BMI 24.78 kg/m   Physical Exam  Constitutional: He is oriented to person, place, and time. He appears well-developed and well-nourished.  Pulmonary/Chest: Effort normal.  Musculoskeletal:       Left knee: He exhibits no effusion.  Neurological: He is alert and oriented to person, place, and time.  Psychiatric: He has a normal mood and affect.  Nursing note reviewed.   Left Knee Exam   Tenderness  The patient is experiencing tenderness in the patellar tendon.  Range of Motion  The patient has normal left knee ROM.  Tests  Varus: negative Valgus: negative  Other  Swelling: mild Effusion: no effusion present  Comments:  Does have crepitation with ROM.      Specialty Comments:  No specialty comments available.  Imaging: Xr Knee 1-2 Views Left  Result Date: 04/23/2016 Two views of the left knee show bone on bone osteoarthritis with patellar spurring.  PMFS History: Patient Active Problem List   Diagnosis Date Noted  . Unilateral primary osteoarthritis, left knee 04/23/2016  . Hyperlipidemia 11/14/2014  . Erectile dysfunction 11/14/2014  . Diabetic neuropathy ([place]) 10/31/2014  . Hypoglycemia associated with diabetes ([place])   . Alcoholism ([place])   . Cocaine abuse   . Blurry vision, bilateral   . HSV-1 (herpes simplex virus 1) infection   . HSV-2 (herpes simplex virus 2) infection   . Congestive heart disease ([place])   . Essential hypertension   . DM w/o complication type II ([place])   . Hypothermia 10/24/2014  . Near syncope   . Weakness   . Dyspnea on exertion   . Palpitation   .  Alcohol abuse   . Genital HSV   . ETOH abuse 07/28/2011  . Pulmonary nodule 05/30/2011  . Pancreatitis 05/28/2011  . Diabetes ([place]) 05/28/2011   Past Medical History:  Diagnosis Date  . Angina   . CHF (congestive heart failure) ([place])   . Diabetes mellitus   . GERD (gastroesophageal reflux disease)   . Hypertension   . Meningitis   . Pancreatitis   . Pneumonia     Family History  Problem Relation Age of Onset  . Heart failure Mother   . Diabetes Mother   . Hypertension Mother   . Heart disease Father   . Heart failure Father     Past Surgical History:  Procedure Laterality Date  . ERCP  08/02/2011   Procedure: ENDOSCOPIC RETROGRADE CHOLANGIOPANCREATOGRAPHY (ERCP);  Surgeon: Beryle Beams, MD;  Location: [facility] ENDOSCOPY;  Service: Endoscopy;  Laterality: N/A;  . ERCP  08/22/2011   Procedure: ENDOSCOPIC RETROGRADE CHOLANGIOPANCREATOGRAPHY (ERCP);  Surgeon: Beryle Beams, MD;  Location: [facility] ENDOSCOPY;  Service: Endoscopy;  Laterality: N/A;  . TONSILLECTOMY AND ADENOIDECTOMY     "as a kid"  . TYMPANOSTOMY TUBE PLACEMENT     Social History   Occupational History  . Not on file.   Social History Main Topics  . Smoking status: Never Smoker  . Smokeless tobacco: Never Used  . Alcohol use 4.2 oz/week    3 Cans of beer, 4 Shots of liquor per week     Comment: Past daily use; ocassional beer  . Drug use:     Types: Cocaine     Comment: last time used cocaine3-4 months ago  . Sexual activity: Not on file

## 2016-05-11 ENCOUNTER — Other Ambulatory Visit: Payer: Self-pay | Admitting: Internal Medicine

## 2016-05-31 ENCOUNTER — Other Ambulatory Visit: Payer: Self-pay | Admitting: Family Medicine

## 2016-06-06 ENCOUNTER — Other Ambulatory Visit: Payer: Self-pay | Admitting: Family Medicine

## 2016-06-06 DIAGNOSIS — Z794 Long term (current) use of insulin: Principal | ICD-10-CM

## 2016-06-06 DIAGNOSIS — E1149 Type 2 diabetes mellitus with other diabetic neurological complication: Secondary | ICD-10-CM

## 2016-06-12 DIAGNOSIS — F1020 Alcohol dependence, uncomplicated: Secondary | ICD-10-CM | POA: Diagnosis not present

## 2016-06-12 DIAGNOSIS — F1410 Cocaine abuse, uncomplicated: Secondary | ICD-10-CM | POA: Diagnosis not present

## 2016-06-13 ENCOUNTER — Ambulatory Visit: Payer: Medicare HMO | Attending: Family Medicine | Admitting: Family Medicine

## 2016-06-13 ENCOUNTER — Encounter: Payer: Self-pay | Admitting: Family Medicine

## 2016-06-13 VITALS — BP 114/71 | HR 100 | Temp 97.3°F | Ht 68.0 in | Wt 167.8 lb

## 2016-06-13 DIAGNOSIS — K219 Gastro-esophageal reflux disease without esophagitis: Secondary | ICD-10-CM | POA: Diagnosis not present

## 2016-06-13 DIAGNOSIS — I509 Heart failure, unspecified: Secondary | ICD-10-CM | POA: Diagnosis not present

## 2016-06-13 DIAGNOSIS — I110 Hypertensive heart disease with heart failure: Secondary | ICD-10-CM | POA: Diagnosis not present

## 2016-06-13 DIAGNOSIS — E119 Type 2 diabetes mellitus without complications: Secondary | ICD-10-CM | POA: Diagnosis not present

## 2016-06-13 DIAGNOSIS — E1140 Type 2 diabetes mellitus with diabetic neuropathy, unspecified: Secondary | ICD-10-CM | POA: Diagnosis not present

## 2016-06-13 DIAGNOSIS — N481 Balanitis: Secondary | ICD-10-CM | POA: Diagnosis not present

## 2016-06-13 DIAGNOSIS — B009 Herpesviral infection, unspecified: Secondary | ICD-10-CM | POA: Diagnosis not present

## 2016-06-13 DIAGNOSIS — Z7982 Long term (current) use of aspirin: Secondary | ICD-10-CM | POA: Diagnosis not present

## 2016-06-13 DIAGNOSIS — E785 Hyperlipidemia, unspecified: Secondary | ICD-10-CM | POA: Insufficient documentation

## 2016-06-13 DIAGNOSIS — A6000 Herpesviral infection of urogenital system, unspecified: Secondary | ICD-10-CM | POA: Diagnosis not present

## 2016-06-13 DIAGNOSIS — Z8661 Personal history of infections of the central nervous system: Secondary | ICD-10-CM | POA: Diagnosis not present

## 2016-06-13 DIAGNOSIS — I10 Essential (primary) hypertension: Secondary | ICD-10-CM

## 2016-06-13 DIAGNOSIS — Z794 Long term (current) use of insulin: Secondary | ICD-10-CM | POA: Insufficient documentation

## 2016-06-13 LAB — GLUCOSE, POCT (MANUAL RESULT ENTRY): POC Glucose: 244 mg/dl — AB (ref 70–99)

## 2016-06-13 LAB — POCT GLYCOSYLATED HEMOGLOBIN (HGB A1C): HEMOGLOBIN A1C: 12.5

## 2016-06-13 MED ORDER — CLOTRIMAZOLE 1 % EX CREA: 1.0000 "application " | TOPICAL_CREAM | Freq: Two times a day (BID) | CUTANEOUS | 0 refills | Status: DC

## 2016-06-13 MED ORDER — VALACYCLOVIR HCL 500 MG PO TABS: 500.0000 mg | ORAL_TABLET | Freq: Two times a day (BID) | ORAL | 0 refills | Status: DC

## 2016-06-13 NOTE — Patient Instructions (Signed)
Balanitis Balanitis is inflammation of the head of the penis (glans).  CAUSES  Balanitis has multiple causes, both infectious and noninfectious. Often balanitis is the result of poor personal hygiene, especially in uncircumcised males. Without adequate washing, viruses, bacteria, and yeast collect between the foreskin and the glans. This can cause an infection. Lack of air and irritation from a normal secretion called smegma contribute to the cause in uncircumcised males. Other causes include:  Chemical irritation from the use of certain soaps and shower gels (especially soaps with perfumes), condoms, personal lubricants, petroleum jelly, spermicides, and fabric conditioners.  Skin conditions, such as eczema, dermatitis, and psoriasis.  Allergies to drugs, such as tetracycline and sulfa.  Certain medical conditions, including liver cirrhosis, congestive heart failure, and kidney disease.  Morbid obesity. RISK FACTORS  Diabetes mellitus.  A tight foreskin that is difficult to pull back past the glans (phimosis).  Sex without the use of a condom. SIGNS AND SYMPTOMS  Symptoms may include:  Discharge coming from under the foreskin.  Tenderness.  Itching and inability to get an erection (because of the pain).  Redness and a rash.  Sores on the glans and on the foreskin. DIAGNOSIS Diagnosis of balanitis is confirmed through a physical exam. TREATMENT The treatment is based on the cause of the balanitis. Treatment may include:  Frequent cleansing.  Keeping the glans and foreskin dry.  Use of medicines such as creams, pain medicines, antibiotics, or medicines to treat fungal infections.  Sitz baths. If the irritation has caused a scar on the foreskin that prevents easy retraction, a circumcision may be recommended.  HOME CARE INSTRUCTIONS  Sex should be avoided until the condition has cleared. MAKE SURE YOU:  Understand these instructions.  Will watch your  condition.  Will get help right away if you are not doing well or get worse. This information is not intended to replace advice given to you by your health care provider. Make sure you discuss any questions you have with your health care provider. Document Released: 10/12/2008 Document Revised: 05/31/2013 Document Reviewed: 11/15/2012 Elsevier Interactive Patient Education  2017 [employer]  

## 2016-06-13 NOTE — Progress Notes (Signed)
Subjective:  Patient ID: Melvin [name], male    DOB: 13-Mar-1961  Age: 56 y.o. MRN: [id number]  CC: Diabetes   HPI JOUD INGWERSEN with a history of type 2 diabetes mellitus (A1c 12 5), diabetic neuropathy, hyperlipidemia who comes into the clinic for a follow-up visit.  Denies hypoglycemia and has been taking 30 units of Lantus as well as his NovoLog sliding scale and informs me his fasting blood sugars have been in the 125 range. He had bilateral eye surgery 3 months ago  Tolerating his atorvastatin and denies myalgias.  Complains of penile sore; informs me he was diagnosed with HSV 2 previously. Denies dysuria or hematuria and has no itching, burning or pain in the penile region.  Past Medical History:  Diagnosis Date  . Angina   . CHF (congestive heart failure) ([place])   . Diabetes mellitus   . GERD (gastroesophageal reflux disease)   . Hypertension   . Meningitis   . Pancreatitis   . Pneumonia     Past Surgical History:  Procedure Laterality Date  . ERCP  08/02/2011   Procedure: ENDOSCOPIC RETROGRADE CHOLANGIOPANCREATOGRAPHY (ERCP);  Surgeon: Beryle Beams, MD;  Location: [facility] ENDOSCOPY;  Service: Endoscopy;  Laterality: N/A;  . ERCP  08/22/2011   Procedure: ENDOSCOPIC RETROGRADE CHOLANGIOPANCREATOGRAPHY (ERCP);  Surgeon: Beryle Beams, MD;  Location: [facility] ENDOSCOPY;  Service: Endoscopy;  Laterality: N/A;  . TONSILLECTOMY AND ADENOIDECTOMY     "as a kid"  . TYMPANOSTOMY TUBE PLACEMENT      No Known Allergies   Outpatient Medications Prior to Visit  Medication Sig Dispense Refill  . ACCU-CHEK SMARTVIEW test strip USE AS INSTRUCTED 100 each 5  . aspirin 81 MG EC tablet Take 1 tablet (81 mg total) by mouth daily. 30 tablet 3  . atorvastatin (LIPITOR) 10 MG tablet Take 1 tablet (10 mg total) by mouth daily at 6 PM. 30 tablet 3  . B-D INS SYRINGE 0.5CC/31GX5/16 31G X 5/16" 0.5 ML MISC USE TO INJECT INSULIN 3 TIMES A DAY AND AT BEDTIME 100 each 5  . Blood Glucose  Monitoring Suppl (TRUE METRIX METER) W/DEVICE KIT 1 each by Does not apply route 4 (four) times daily -  before meals and at bedtime. 1 kit 0  . gabapentin (NEURONTIN) 300 MG capsule TAKE 2 CAPSULES (600 MG TOTAL) BY MOUTH 2 (TWO) TIMES DAILY. 120 capsule 3  . insulin aspart (NOVOLOG) 100 UNIT/ML injection 150-200, give 3 units 201-250 give 5 units 251-300 give 7 units 301-350 give 9 units 351-400 give 12 units 3 vial 5  . LANTUS 100 UNIT/ML injection INJECT 0.3 MLS (30 UNITS TOTAL) INTO THE SKIN DAILY. 10 mL 3  . lisinopril (PRINIVIL,ZESTRIL) 2.5 MG tablet TAKE 1 TABLET (2.5 MG TOTAL) BY MOUTH DAILY. 30 tablet 3  . TRUEPLUS LANCETS 28G MISC 1 each by Does not apply route 4 (four) times daily -  before meals and at bedtime. 100 each 5  . ketorolac (ACULAR) 0.4 % SOLN 1 DROP IN SURGICAL EYE FOUR TIMES A DAY STARTING 1 DAY BEFORE SURGERY  1  . ofloxacin (OCUFLOX) 0.3 % ophthalmic solution 1 DROP IN SURGICAL EYE FOUR TIMES A DAY STARTING 1 DAY BEFORE SURGERY  1  . prednisoLONE acetate (PRED FORTE) 1 % ophthalmic suspension 1 DROP IN SURGICAL EYE FOUR TIMES A DAY AFTER SURGERY  1   No facility-administered medications prior to visit.     ROS Review of Systems  Constitutional: Negative for activity change and  appetite change.  HENT: Negative for sinus pressure and sore throat.   Eyes: Negative for visual disturbance.  Respiratory: Negative for cough, chest tightness and shortness of breath.   Cardiovascular: Negative for chest pain and leg swelling.  Gastrointestinal: Negative for abdominal distention, abdominal pain, constipation and diarrhea.  Endocrine: Negative.   Genitourinary: Positive for genital sores. Negative for dysuria.  Musculoskeletal: Negative for joint swelling and myalgias.  Skin: Negative for rash.  Allergic/Immunologic: Negative.   Neurological: Negative for weakness, light-headedness and numbness.  Psychiatric/Behavioral: Negative for dysphoric mood and suicidal ideas.      Objective:  BP 114/71 (BP Location: Right Arm, Patient Position: Sitting, Cuff Size: Small)   Pulse 100   Temp 97.3 F (36.3 C) (Oral)   Ht _0  (1.727 m)   Wt 167 lb 12.8 oz (76.1 kg)   SpO2 99%   BMI 25.51 kg/m   BP/Weight 06/13/2016 04/23/2016 12/14/6752  Systolic BP 492 - 95  Diastolic BP 71 - 61  Wt. (Lbs) 167.8 163 165.2  BMI 25.51 24.78 25.12      Physical Exam  Constitutional: He is oriented to person, place, and time. He appears well-developed and well-nourished.  Cardiovascular: Normal rate, normal heart sounds and intact distal pulses.   No murmur heard. Pulmonary/Chest: Effort normal and breath sounds normal. He has no wheezes. He has no rales. He exhibits no tenderness.  Abdominal: Soft. Bowel sounds are normal. He exhibits no distension and no mass. There is no tenderness.  Genitourinary:  Genitourinary Comments: Uncircumcised Whitish discoloration of previous of penis with penile sore at 9 o'clock  Musculoskeletal: Normal range of motion.  Neurological: He is alert and oriented to person, place, and time.   CMP Latest Ref Rng & Units 09/03/2015 12/13/2014 11/14/2014  Glucose 65 - 99 mg/dL 314(H) 365(H) 398(H)  BUN 7 - 25 mg/dL _1 Creatinine 0.70 - 1.33 mg/dL 1.04 1.14 0.86  Sodium 135 - 146 mmol/L 136 134(L) 131(L)  Potassium 3.5 - 5.3 mmol/L 4.3 4.2 5.0  Chloride 98 - 110 mmol/L 101 103 95(L)  CO2 20 - 31 mmol/L _2 Calcium 8.6 - 10.3 mg/dL 9.3 9.4 9.9  Total Protein 6.5 - 8.1 g/dL - - -  Total Bilirubin 0.3 - 1.2 mg/dL - - -  Alkaline Phos 38 - 126 U/L - - -  AST 15 - 41 U/L - - -  ALT 17 - 63 U/L - - -    Lipid Panel     Component Value Date/Time   CHOL 163 10/25/2014 0250   TRIG 64 10/25/2014 0250   HDL 95 10/25/2014 0250   CHOLHDL 1.7 10/25/2014 0250   VLDL 13 10/25/2014 0250   LDLCALC 55 10/25/2014 0250      Assessment & Plan:   1. Type 2 diabetes mellitus without complication, without long-term current use of insulin  (HCC) Uncontrolled with A1c of 12.5 The irony is that he states his fasting blood sugars have been in the 125 range Advised to bring in blood sugar log which will be reviewed at his next visit Stressed compliance with ADA diet and lifestyle modifications - Glucose (CBG) - HgB A1c - COMPLETE METABOLIC PANEL WITH GFR; Future - Lipid Panel w/reflex Direct LDL; Future - Microalbumin / creatinine urine ratio; Future  2. Balanitis Discussed hygiene - clotrimazole (LOTRIMIN) 1 % cream; Apply 1 application topically 2 (two) times daily.  Dispense: 30 g; Refill: 0  3. HSV-2 (herpes simplex virus 2)  infection History of HSV-2 Will treat presumptively for an acute flare. Sexual precautions - valACYclovir (VALTREX) 500 MG tablet; Take 1 tablet (500 mg total) by mouth 2 (two) times daily.  Dispense: 14 tablet; Refill: 0  4. Essential hypertension Controlled Low-sodium diet   Meds ordered this encounter  Medications  . valACYclovir (VALTREX) 500 MG tablet    Sig: Take 1 tablet (500 mg total) by mouth 2 (two) times daily.    Dispense:  14 tablet    Refill:  0  . clotrimazole (LOTRIMIN) 1 % cream    Sig: Apply 1 application topically 2 (two) times daily.    Dispense:  30 g    Refill:  0    Follow-up: Return in about 3 weeks (around 07/04/2016) for Follow-up of diabetes mellitus.   Arnoldo Morale MD

## 2016-06-18 ENCOUNTER — Observation Stay (HOSPITAL_COMMUNITY)
Admission: EM | Admit: 2016-06-18 | Discharge: 2016-06-20 | Disposition: A | Discharge status: Home / Self Care | Payer: Medicare HMO | Attending: Internal Medicine | Admitting: Internal Medicine

## 2016-06-18 ENCOUNTER — Encounter (HOSPITAL_COMMUNITY): Payer: Self-pay | Admitting: *Deleted

## 2016-06-18 DIAGNOSIS — K219 Gastro-esophageal reflux disease without esophagitis: Secondary | ICD-10-CM | POA: Diagnosis not present

## 2016-06-18 DIAGNOSIS — F1410 Cocaine abuse, uncomplicated: Secondary | ICD-10-CM | POA: Insufficient documentation

## 2016-06-18 DIAGNOSIS — N189 Chronic kidney disease, unspecified: Secondary | ICD-10-CM | POA: Diagnosis present

## 2016-06-18 DIAGNOSIS — E1140 Type 2 diabetes mellitus with diabetic neuropathy, unspecified: Secondary | ICD-10-CM | POA: Diagnosis not present

## 2016-06-18 DIAGNOSIS — N529 Male erectile dysfunction, unspecified: Secondary | ICD-10-CM | POA: Insufficient documentation

## 2016-06-18 DIAGNOSIS — Z8661 Personal history of infections of the central nervous system: Secondary | ICD-10-CM | POA: Insufficient documentation

## 2016-06-18 DIAGNOSIS — F1010 Alcohol abuse, uncomplicated: Secondary | ICD-10-CM | POA: Diagnosis present

## 2016-06-18 DIAGNOSIS — B009 Herpesviral infection, unspecified: Secondary | ICD-10-CM | POA: Diagnosis present

## 2016-06-18 DIAGNOSIS — E119 Type 2 diabetes mellitus without complications: Secondary | ICD-10-CM | POA: Diagnosis not present

## 2016-06-18 DIAGNOSIS — N179 Acute kidney failure, unspecified: Secondary | ICD-10-CM | POA: Insufficient documentation

## 2016-06-18 DIAGNOSIS — I5032 Chronic diastolic (congestive) heart failure: Secondary | ICD-10-CM | POA: Diagnosis present

## 2016-06-18 DIAGNOSIS — Z794 Long term (current) use of insulin: Secondary | ICD-10-CM | POA: Insufficient documentation

## 2016-06-18 DIAGNOSIS — F1020 Alcohol dependence, uncomplicated: Secondary | ICD-10-CM | POA: Diagnosis not present

## 2016-06-18 DIAGNOSIS — E1165 Type 2 diabetes mellitus with hyperglycemia: Secondary | ICD-10-CM | POA: Insufficient documentation

## 2016-06-18 DIAGNOSIS — A6000 Herpesviral infection of urogenital system, unspecified: Secondary | ICD-10-CM | POA: Insufficient documentation

## 2016-06-18 DIAGNOSIS — E785 Hyperlipidemia, unspecified: Secondary | ICD-10-CM | POA: Diagnosis present

## 2016-06-18 DIAGNOSIS — R197 Diarrhea, unspecified: Secondary | ICD-10-CM | POA: Diagnosis not present

## 2016-06-18 DIAGNOSIS — M1712 Unilateral primary osteoarthritis, left knee: Secondary | ICD-10-CM | POA: Insufficient documentation

## 2016-06-18 DIAGNOSIS — Z7982 Long term (current) use of aspirin: Secondary | ICD-10-CM | POA: Insufficient documentation

## 2016-06-18 DIAGNOSIS — R739 Hyperglycemia, unspecified: Secondary | ICD-10-CM

## 2016-06-18 DIAGNOSIS — E860 Dehydration: Secondary | ICD-10-CM

## 2016-06-18 DIAGNOSIS — I110 Hypertensive heart disease with heart failure: Secondary | ICD-10-CM | POA: Insufficient documentation

## 2016-06-18 DIAGNOSIS — I10 Essential (primary) hypertension: Secondary | ICD-10-CM | POA: Diagnosis present

## 2016-06-18 LAB — BASIC METABOLIC PANEL
Anion gap: 13 (ref 5–15)
BUN: 31 mg/dL — AB (ref 6–20)
CHLORIDE: 99 mmol/L — AB (ref 101–111)
CO2: 18 mmol/L — ABNORMAL LOW (ref 22–32)
Calcium: 9.2 mg/dL (ref 8.9–10.3)
Creatinine, Ser: 3.43 mg/dL — ABNORMAL HIGH (ref 0.61–1.24)
GFR calc Af Amer: 21 mL/min — ABNORMAL LOW (ref >60)
GFR calc non Af Amer: 18 mL/min — ABNORMAL LOW (ref >60)
Glucose, Bld: 402 mg/dL — ABNORMAL HIGH (ref 65–99)
POTASSIUM: 4.3 mmol/L (ref 3.5–5.1)
Sodium: 130 mmol/L — ABNORMAL LOW (ref 135–145)

## 2016-06-18 LAB — CBG MONITORING, ED
Glucose-Capillary: 493 mg/dL — ABNORMAL HIGH (ref 65–99)
Glucose-Capillary: 345 mg/dL — ABNORMAL HIGH (ref 65–99)

## 2016-06-18 LAB — CBC
HEMATOCRIT: 33.6 % — AB (ref 39.0–52.0)
Hemoglobin: 11.6 g/dL — ABNORMAL LOW (ref 13.0–17.0)
MCH: 31.4 pg (ref 26.0–34.0)
MCHC: 34.5 g/dL (ref 30.0–36.0)
MCV: 90.8 fL (ref 78.0–100.0)
Platelets: 156 10*3/uL (ref 150–400)
RBC: 3.7 MIL/uL — ABNORMAL LOW (ref 4.22–5.81)
RDW: 13.4 % (ref 11.5–15.5)
WBC: 7.2 10*3/uL (ref 4.0–10.5)

## 2016-06-18 LAB — BRAIN NATRIURETIC PEPTIDE: B Natriuretic Peptide: 9.6 pg/mL (ref 0.0–100.0)

## 2016-06-18 MED ORDER — LORAZEPAM 2 MG/ML IJ SOLN
0.0000 mg | Freq: Two times a day (BID) | INTRAMUSCULAR | Status: DC
Start: 2016-06-20 — End: 2016-06-19

## 2016-06-18 MED ORDER — FOLIC ACID 1 MG PO TABS
1.0000 mg | ORAL_TABLET | Freq: Every day | ORAL | Status: DC
  Administered 2016-06-19 – 2016-06-20 (×2): 1 mg via ORAL
  Filled 2016-06-18 (×2): qty 1

## 2016-06-18 MED ORDER — ADULT MULTIVITAMIN W/MINERALS CH
1.0000 | ORAL_TABLET | Freq: Every day | ORAL | Status: DC
  Administered 2016-06-19 – 2016-06-20 (×2): 1 via ORAL
  Filled 2016-06-18 (×2): qty 1

## 2016-06-18 MED ORDER — ONDANSETRON HCL 4 MG/2ML IJ SOLN: 4.0000 mg | Freq: Three times a day (TID) | INTRAMUSCULAR | Status: DC | PRN

## 2016-06-18 MED ORDER — ATORVASTATIN CALCIUM 10 MG PO TABS
10.0000 mg | ORAL_TABLET | Freq: Every day | ORAL | Status: DC
  Administered 2016-06-19: 10 mg via ORAL
  Filled 2016-06-18: qty 1

## 2016-06-18 MED ORDER — VALACYCLOVIR HCL 500 MG PO TABS
500.0000 mg | ORAL_TABLET | Freq: Two times a day (BID) | ORAL | Status: DC
  Administered 2016-06-19 – 2016-06-20 (×4): 500 mg via ORAL
  Filled 2016-06-18 (×4): qty 1

## 2016-06-18 MED ORDER — ACETAMINOPHEN 650 MG RE SUPP: 650.0000 mg | Freq: Four times a day (QID) | RECTAL | Status: DC | PRN

## 2016-06-18 MED ORDER — ACETAMINOPHEN 325 MG PO TABS: 650.0000 mg | ORAL_TABLET | Freq: Four times a day (QID) | ORAL | Status: DC | PRN

## 2016-06-18 MED ORDER — CLOTRIMAZOLE 1 % EX CREA
1.0000 "application " | TOPICAL_CREAM | Freq: Two times a day (BID) | CUTANEOUS | Status: DC
  Administered 2016-06-19 – 2016-06-20 (×4): 1 via TOPICAL
  Filled 2016-06-18: qty 15

## 2016-06-18 MED ORDER — PANCRELIPASE (LIP-PROT-AMYL) 12000-38000 UNITS PO CPEP
12000.0000 [IU] | ORAL_CAPSULE | Freq: Three times a day (TID) | ORAL | Status: DC
  Administered 2016-06-19 – 2016-06-20 (×5): 12000 [IU] via ORAL
  Filled 2016-06-18 (×5): qty 1

## 2016-06-18 MED ORDER — LORAZEPAM 1 MG PO TABS: 1.0000 mg | ORAL_TABLET | Freq: Four times a day (QID) | ORAL | Status: DC | PRN

## 2016-06-18 MED ORDER — ZOLPIDEM TARTRATE 5 MG PO TABS
5.0000 mg | ORAL_TABLET | Freq: Every evening | ORAL | Status: DC | PRN
  Filled 2016-06-18: qty 1

## 2016-06-18 MED ORDER — SODIUM CHLORIDE 0.9 % IV SOLN
INTRAVENOUS | Status: DC
  Administered 2016-06-19 (×2): via INTRAVENOUS

## 2016-06-18 MED ORDER — ASPIRIN EC 81 MG PO TBEC
81.0000 mg | DELAYED_RELEASE_TABLET | Freq: Every day | ORAL | Status: DC
  Administered 2016-06-19 – 2016-06-20 (×2): 81 mg via ORAL
  Filled 2016-06-18 (×2): qty 1

## 2016-06-18 MED ORDER — INSULIN ASPART 100 UNIT/ML ~~LOC~~ SOLN
5.0000 [IU] | Freq: Once | SUBCUTANEOUS | Status: AC
  Administered 2016-06-18: 5 [IU] via INTRAVENOUS
  Filled 2016-06-18: qty 1

## 2016-06-18 MED ORDER — VITAMIN B-1 100 MG PO TABS
100.0000 mg | ORAL_TABLET | Freq: Every day | ORAL | Status: DC
  Administered 2016-06-19 – 2016-06-20 (×2): 100 mg via ORAL
  Filled 2016-06-18 (×2): qty 1

## 2016-06-18 MED ORDER — INSULIN GLARGINE 100 UNIT/ML ~~LOC~~ SOLN
30.0000 [IU] | Freq: Every day | SUBCUTANEOUS | Status: DC
  Administered 2016-06-19 – 2016-06-20 (×2): 30 [IU] via SUBCUTANEOUS
  Filled 2016-06-18 (×3): qty 0.3

## 2016-06-18 MED ORDER — INSULIN ASPART 100 UNIT/ML ~~LOC~~ SOLN: 0.0000 [IU] | Freq: Three times a day (TID) | SUBCUTANEOUS | Status: DC

## 2016-06-18 MED ORDER — SODIUM CHLORIDE 0.9% FLUSH
3.0000 mL | Freq: Two times a day (BID) | INTRAVENOUS | Status: DC
  Administered 2016-06-19 – 2016-06-20 (×2): 3 mL via INTRAVENOUS

## 2016-06-18 MED ORDER — HYDRALAZINE HCL 20 MG/ML IJ SOLN: 5.0000 mg | INTRAMUSCULAR | Status: DC | PRN

## 2016-06-18 MED ORDER — THIAMINE HCL 100 MG/ML IJ SOLN
100.0000 mg | Freq: Every day | INTRAMUSCULAR | Status: DC
Start: 2016-06-19 — End: 2016-06-19

## 2016-06-18 MED ORDER — LORAZEPAM 2 MG/ML IJ SOLN
0.0000 mg | Freq: Four times a day (QID) | INTRAMUSCULAR | Status: DC
Start: 2016-06-18 — End: 2016-06-19

## 2016-06-18 MED ORDER — SODIUM CHLORIDE 0.9 % IV BOLUS (SEPSIS)
1000.0000 mL | Freq: Once | INTRAVENOUS | Status: AC
  Administered 2016-06-18: 1000 mL via INTRAVENOUS

## 2016-06-18 MED ORDER — LORAZEPAM 2 MG/ML IJ SOLN
1.0000 mg | Freq: Four times a day (QID) | INTRAMUSCULAR | Status: DC | PRN
Start: 2016-06-18 — End: 2016-06-19

## 2016-06-18 MED ORDER — GABAPENTIN 300 MG PO CAPS
600.0000 mg | ORAL_CAPSULE | Freq: Two times a day (BID) | ORAL | Status: DC
  Administered 2016-06-19 – 2016-06-20 (×4): 600 mg via ORAL
  Filled 2016-06-18 (×4): qty 2

## 2016-06-18 MED ORDER — ENOXAPARIN SODIUM 40 MG/0.4ML ~~LOC~~ SOLN
40.0000 mg | SUBCUTANEOUS | Status: DC
  Administered 2016-06-19 (×2): 40 mg via SUBCUTANEOUS
  Filled 2016-06-18 (×2): qty 0.4

## 2016-06-18 NOTE — ED Provider Notes (Signed)
[name] DEPT Provider Note   CSN: [id number] Arrival date & time: 06/18/16  1459     History   Chief Complaint Chief Complaint  Patient presents with  . Hyperglycemia    HPI Melvin [name] is a 56 y.o. male.  The history is provided by the patient.  Weakness  Primary symptoms comment: generalized weakness. This is a new problem. The current episode started 12 to 24 hours ago. The problem has not changed since onset.There was no focality noted. There has been no fever. Pertinent negatives include no shortness of breath, no chest pain and no vomiting. Associated symptoms comments: Intermittent diarrhea. There were no medications administered prior to arrival. Associated medical issues comments: DM.    Past Medical History:  Diagnosis Date  . Angina   . CHF (congestive heart failure) ([place])   . Diabetes mellitus   . GERD (gastroesophageal reflux disease)   . Hypertension   . Meningitis   . Pancreatitis   . Pneumonia     Patient Active Problem List   Diagnosis Date Noted  . Unilateral primary osteoarthritis, left knee 04/23/2016  . Hyperlipidemia 11/14/2014  . Erectile dysfunction 11/14/2014  . Diabetic neuropathy ([place]) 10/31/2014  . Hypoglycemia associated with diabetes ([place])   . Alcoholism ([place])   . Cocaine abuse   . Blurry vision, bilateral   . HSV-1 (herpes simplex virus 1) infection   . HSV-2 (herpes simplex virus 2) infection   . Congestive heart disease ([place])   . Essential hypertension   . DM w/o complication type II ([place])   . Hypothermia 10/24/2014  . Near syncope   . Weakness   . Dyspnea on exertion   . Palpitation   . Alcohol abuse   . Genital HSV   . ETOH abuse 07/28/2011  . Pulmonary nodule 05/30/2011  . Pancreatitis 05/28/2011  . Diabetes ([place]) 05/28/2011    Past Surgical History:  Procedure Laterality Date  . ERCP  08/02/2011   Procedure: ENDOSCOPIC RETROGRADE CHOLANGIOPANCREATOGRAPHY (ERCP);  Surgeon: Beryle Beams, MD;  Location:  [facility] ENDOSCOPY;  Service: Endoscopy;  Laterality: N/A;  . ERCP  08/22/2011   Procedure: ENDOSCOPIC RETROGRADE CHOLANGIOPANCREATOGRAPHY (ERCP);  Surgeon: Beryle Beams, MD;  Location: [facility] ENDOSCOPY;  Service: Endoscopy;  Laterality: N/A;  . TONSILLECTOMY AND ADENOIDECTOMY     "as a kid"  . TYMPANOSTOMY TUBE PLACEMENT         Home Medications    Prior to Admission medications   Medication Sig Start Date End Date Taking? Authorizing Provider  ACCU-CHEK SMARTVIEW test strip USE AS INSTRUCTED 03/10/16   Arnoldo Morale, MD  aspirin 81 MG EC tablet Take 1 tablet (81 mg total) by mouth daily. 11/14/14   Arnoldo Morale, MD  atorvastatin (LIPITOR) 10 MG tablet Take 1 tablet (10 mg total) by mouth daily at 6 PM. 02/04/16   Arnoldo Morale, MD  B-D INS SYRINGE 0.5CC/31GX5/16 31G X 5/16" 0.5 ML MISC USE TO INJECT INSULIN 3 TIMES A DAY AND AT BEDTIME 05/12/16   Josalyn Funches, MD  Blood Glucose Monitoring Suppl (TRUE METRIX METER) W/DEVICE KIT 1 each by Does not apply route 4 (four) times daily -  before meals and at bedtime. 01/31/15   Lance Bosch, NP  clotrimazole (LOTRIMIN) 1 % cream Apply 1 application topically 2 (two) times daily. 06/13/16   Arnoldo Morale, MD  gabapentin (NEURONTIN) 300 MG capsule TAKE 2 CAPSULES (600 MG TOTAL) BY MOUTH 2 (TWO) TIMES DAILY. 06/03/16   Arnoldo Morale, MD  insulin aspart (NOVOLOG) 100 UNIT/ML injection 150-200, give 3 units 201-250 give 5 units 251-300 give 7 units 301-350 give 9 units 351-400 give 12 units 02/04/16   Arnoldo Morale, MD  LANTUS 100 UNIT/ML injection INJECT 0.3 MLS (30 UNITS TOTAL) INTO THE SKIN DAILY. 06/06/16   Arnoldo Morale, MD  lisinopril (PRINIVIL,ZESTRIL) 2.5 MG tablet TAKE 1 TABLET (2.5 MG TOTAL) BY MOUTH DAILY. 06/06/16   Arnoldo Morale, MD  TRUEPLUS LANCETS 28G MISC 1 each by Does not apply route 4 (four) times daily -  before meals and at bedtime. 01/31/15   Lance Bosch, NP  valACYclovir (VALTREX) 500 MG tablet Take 1 tablet (500 mg total) by mouth 2 (two)  times daily. 06/13/16   Arnoldo Morale, MD    Family History Family History  Problem Relation Age of Onset  . Heart failure Mother   . Diabetes Mother   . Hypertension Mother   . Heart disease Father   . Heart failure Father     Social History Social History  Substance Use Topics  . Smoking status: Never Smoker  . Smokeless tobacco: Never Used  . Alcohol use 4.2 oz/week    3 Cans of beer, 4 Shots of liquor per week     Comment: Past daily use; ocassional beer     Allergies   Patient has no known allergies.   Review of Systems Review of Systems  Respiratory: Negative for shortness of breath.   Cardiovascular: Negative for chest pain.  Gastrointestinal: Negative for vomiting.  Neurological: Positive for weakness.  All other systems reviewed and are negative.    Physical Exam Updated Vital Signs BP 98/60 (BP Location: Right Arm)   Pulse 96   Temp 98.2 F (36.8 C) (Oral)   Resp 16   SpO2 99%   Physical Exam  Constitutional: He is oriented to person, place, and time. He appears well-developed and well-nourished. No distress.  HENT:  Head: Normocephalic and atraumatic.  Nose: Nose normal.  Eyes: Conjunctivae are normal.  Neck: Neck supple. No tracheal deviation present.  Cardiovascular: Normal rate, regular rhythm and normal heart sounds.   Pulmonary/Chest: Effort normal and breath sounds normal. No respiratory distress.  Abdominal: Soft. He exhibits no distension. There is no tenderness.  Neurological: He is alert and oriented to person, place, and time.  Skin: Skin is warm and dry. Capillary refill takes less than 2 seconds.  Psychiatric: He has a normal mood and affect.     ED Treatments / Results  Labs (all labs ordered are listed, but only abnormal results are displayed) Labs Reviewed  BASIC METABOLIC PANEL - Abnormal; Notable for the following:       Result Value   Sodium 130 (*)    Chloride 99 (*)    CO2 18 (*)    Glucose, Bld 402 (*)    BUN 31  (*)    Creatinine, Ser 3.43 (*)    GFR calc non Af Amer 18 (*)    GFR calc Af Amer 21 (*)    All other components within normal limits  CBC - Abnormal; Notable for the following:    RBC 3.70 (*)    Hemoglobin 11.6 (*)    HCT 33.6 (*)    All other components within normal limits  GLUCOSE, CAPILLARY - Abnormal; Notable for the following:    Glucose-Capillary 334 (*)    All other components within normal limits  CBG MONITORING, ED - Abnormal; Notable for the following:  Glucose-Capillary 493 (*)    All other components within normal limits  CBG MONITORING, ED - Abnormal; Notable for the following:    Glucose-Capillary 345 (*)    All other components within normal limits  C DIFFICILE QUICK SCREEN W PCR REFLEX  MRSA PCR SCREENING  BRAIN NATRIURETIC PEPTIDE  URINALYSIS, ROUTINE W REFLEX MICROSCOPIC  CREATININE, URINE, RANDOM  SODIUM, URINE, RANDOM  BASIC METABOLIC PANEL  CBC    EKG  EKG Interpretation None       Radiology No results found.  Procedures Procedures (including critical care time)  Medications Ordered in ED Medications - No data to display   Initial Impression / Assessment and Plan / ED Course  I have reviewed the triage vital signs and the nursing notes.  Pertinent labs & imaging results that were available during my care of the patient were reviewed by me and considered in my medical decision making (see chart for details).  Clinical Course     56 y.o. male presents with malaise over the last day with intermittent diarrhea preceding. Noted to be hyperglycemic, no signs of developing DKA. Labs concerning for  AKI likely 2/2 volume depletion presumably driven by diarrhea and poorly controlled DM. IVF resuscitation and insulin initiated. Hospitalist was consulted for admission and will see the patient in the emergency department.   Final Clinical Impressions(s) / ED Diagnoses   Final diagnoses:  Hyperglycemia without ketosis  AKI (acute kidney  injury) ([place])  Severe dehydration    New Prescriptions New Prescriptions   No medications on file     Leo Grosser, MD 06/19/16 [phone]

## 2016-06-18 NOTE — H&P (Signed)
History and Physical    Melvin [name] ZMO:[id number] DOB: Mar 11, 1961 DOA: 06/18/2016  Referring MD/NP/PA:   PCP: Arnoldo Morale, MD   Patient coming from:  The patient is coming from home.  At baseline, pt is independent for most of ADL.  Chief Complaint: Generalized weakness, diarrhea  HPI: Melvin [name] is a 56 y.o. male with medical history significant of hypertension, diabetes mellitus, hyperlipidemia, pancreatitis, meningitis, alcohol abuse, cocaine abuse, dCHF, who presents with generalized weakness and diarrhea.  Patient states that he has been having intermittent diarrhea for about 4 weeks. He has nausea, occasionally vomiting. No abdominal pain. He has 2-3 bowel movement with loose stool each day. He has generalized weakness recently, and feels thirsty all the time. Denies dizziness or lightheadedness. Patient does not have chest pain, shortness breath, cough, symptoms of UTI or unilateral weakness. He has chills, but no fever. Patient states that his blood sugar has been elevated. He states that he has been compliant to insulin injection.  ED Course: pt was found to have WBC 7.2, blood sugar 402 with AG of 13, AKI with Cre 3,43, temperature normal, slight tachycardia, oxygen saturation 100% on room air. Patient is placed on telemetry bed for observation.  Review of Systems:   General: no fevers, has chills, no changes in body weight, has poor appetite, has fatigue HEENT: no blurry vision, hearing changes or sore throat Respiratory: no dyspnea, coughing, wheezing CV: no chest pain, no palpitations GI: no nausea, vomiting, abdominal pain, diarrhea, constipation GU: no dysuria, burning on urination, increased urinary frequency, hematuria  Ext: no leg edema Neuro: no unilateral weakness, numbness, or tingling, no vision change or hearing loss Skin: no rash, no skin tear. MSK: No muscle spasm, no deformity, no limitation of range of movement in spin Heme: No easy bruising.   Travel history: No recent long distant travel.  Allergy: No Known Allergies  Past Medical History:  Diagnosis Date  . Angina   . CHF (congestive heart failure) ([place])   . Diabetes mellitus   . GERD (gastroesophageal reflux disease)   . Hypertension   . Meningitis   . Pancreatitis   . Pneumonia     Past Surgical History:  Procedure Laterality Date  . ERCP  08/02/2011   Procedure: ENDOSCOPIC RETROGRADE CHOLANGIOPANCREATOGRAPHY (ERCP);  Surgeon: Beryle Beams, MD;  Location: [facility] ENDOSCOPY;  Service: Endoscopy;  Laterality: N/A;  . ERCP  08/22/2011   Procedure: ENDOSCOPIC RETROGRADE CHOLANGIOPANCREATOGRAPHY (ERCP);  Surgeon: Beryle Beams, MD;  Location: [facility] ENDOSCOPY;  Service: Endoscopy;  Laterality: N/A;  . TONSILLECTOMY AND ADENOIDECTOMY     "as a kid"  . TYMPANOSTOMY TUBE PLACEMENT      Social History:  reports that he has never smoked. He has never used smokeless tobacco. He reports that he drinks about 4.2 oz of alcohol per week . He reports that he uses drugs, including Cocaine.  Family History:  Family History  Problem Relation Age of Onset  . Heart failure Mother   . Diabetes Mother   . Hypertension Mother   . Heart disease Father   . Heart failure Father      Prior to Admission medications   Medication Sig Start Date End Date Taking? Authorizing Provider  ACCU-CHEK SMARTVIEW test strip USE AS INSTRUCTED 03/10/16   Arnoldo Morale, MD  aspirin 81 MG EC tablet Take 1 tablet (81 mg total) by mouth daily. 11/14/14   Arnoldo Morale, MD  atorvastatin (LIPITOR) 10 MG tablet Take 1 tablet (10  mg total) by mouth daily at 6 PM. 02/04/16   Arnoldo Morale, MD  B-D INS SYRINGE 0.5CC/31GX5/16 31G X 5/16" 0.5 ML MISC USE TO INJECT INSULIN 3 TIMES A DAY AND AT BEDTIME 05/12/16   Josalyn Funches, MD  Blood Glucose Monitoring Suppl (TRUE METRIX METER) W/DEVICE KIT 1 each by Does not apply route 4 (four) times daily -  before meals and at bedtime. 01/31/15   Lance Bosch, NP  clotrimazole  (LOTRIMIN) 1 % cream Apply 1 application topically 2 (two) times daily. 06/13/16   Arnoldo Morale, MD  gabapentin (NEURONTIN) 300 MG capsule TAKE 2 CAPSULES (600 MG TOTAL) BY MOUTH 2 (TWO) TIMES DAILY. 06/03/16   Arnoldo Morale, MD  insulin aspart (NOVOLOG) 100 UNIT/ML injection 150-200, give 3 units 201-250 give 5 units 251-300 give 7 units 301-350 give 9 units 351-400 give 12 units 02/04/16   Arnoldo Morale, MD  LANTUS 100 UNIT/ML injection INJECT 0.3 MLS (30 UNITS TOTAL) INTO THE SKIN DAILY. 06/06/16   Arnoldo Morale, MD  lisinopril (PRINIVIL,ZESTRIL) 2.5 MG tablet TAKE 1 TABLET (2.5 MG TOTAL) BY MOUTH DAILY. 06/06/16   Arnoldo Morale, MD  TRUEPLUS LANCETS 28G MISC 1 each by Does not apply route 4 (four) times daily -  before meals and at bedtime. 01/31/15   Lance Bosch, NP  valACYclovir (VALTREX) 500 MG tablet Take 1 tablet (500 mg total) by mouth 2 (two) times daily. 06/13/16   Arnoldo Morale, MD    Physical Exam: Vitals:   06/18/16 1528 06/18/16 1845 06/18/16 2130 06/18/16 2345  BP: 147/100 98/60 140/78 103/65  Pulse: 99 96 105 (!) 106  Resp: _0 Temp: 97.8 F (36.6 C) 98.2 F (36.8 C)  98.6 F (37 C)  TempSrc: Oral Oral  Oral  SpO2: 96% 99% 100% 99%  Weight:    73.7 kg (162 lb 6.4 oz)  Height:    _1  (1.727 m)   General: Not in acute distress. Dry mucus and membrane HEENT:       Eyes: PERRL, EOMI, no scleral icterus.       ENT: No discharge from the ears and nose, no pharynx injection, no tonsillar enlargement.        Neck: No JVD, no bruit, no mass felt. Heme: No neck lymph node enlargement. Cardiac: S1/S2, RRR, No murmurs, No gallops or rubs. Respiratory: No rales, wheezing, rhonchi or rubs. GI: Soft, nondistended, nontender, no rebound pain, no organomegaly, BS present. GU: No hematuria Ext: No pitting leg edema bilaterally. 2+DP/PT pulse bilaterally. Musculoskeletal: No joint deformities, No joint redness or warmth, no limitation of ROM in spin. Skin: No rashes.    Neuro: Alert, oriented X3, cranial nerves II-XII grossly intact, moves all extremities normally. Psych: Patient is not psychotic, no suicidal or hemocidal ideation.  Labs on Admission: I have personally reviewed following labs and imaging studies  CBC:  Recent Labs Lab 06/18/16 1538  WBC 7.2  HGB 11.6*  HCT 33.6*  MCV 90.8  PLT 706   Basic Metabolic Panel:  Recent Labs Lab 06/18/16 1538  NA 130*  K 4.3  CL 99*  CO2 18*  GLUCOSE 402*  BUN 31*  CREATININE 3.43*  CALCIUM 9.2   GFR: Estimated Creatinine Clearance: 23 mL/min (by C-G formula based on SCr of 3.43 mg/dL (H)). Liver Function Tests: No results for input(s): AST, ALT, ALKPHOS, BILITOT, PROT, ALBUMIN in the last 168 hours. No results for input(s): LIPASE, AMYLASE in the last 168 hours. No  results for input(s): AMMONIA in the last 168 hours. Coagulation Profile: No results for input(s): INR, PROTIME in the last 168 hours. Cardiac Enzymes: No results for input(s): CKTOTAL, CKMB, CKMBINDEX, TROPONINI in the last 168 hours. BNP (last 3 results) No results for input(s): PROBNP in the last 8760 hours. HbA1C: No results for input(s): HGBA1C in the last 72 hours. CBG:  Recent Labs Lab 06/18/16 2132 06/18/16 2304 06/19/16 0007  GLUCAP 493* 345* 334*   Lipid Profile: No results for input(s): CHOL, HDL, LDLCALC, TRIG, CHOLHDL, LDLDIRECT in the last 72 hours. Thyroid Function Tests: No results for input(s): TSH, T4TOTAL, FREET4, T3FREE, THYROIDAB in the last 72 hours. Anemia Panel: No results for input(s): VITAMINB12, FOLATE, FERRITIN, TIBC, IRON, RETICCTPCT in the last 72 hours. Urine analysis:    Component Value Date/Time   COLORURINE AMBER (A) 10/24/2014 1215   APPEARANCEUR HAZY (A) 10/24/2014 1215   LABSPEC 1.023 10/24/2014 1215   PHURINE 5.0 10/24/2014 1215   GLUCOSEU 500 (A) 10/24/2014 1215   HGBUR MODERATE (A) 10/24/2014 1215   BILIRUBINUR small 05/28/2015 1612   KETONESUR 15 (A) 10/24/2014 1215    PROTEINUR 30 05/28/2015 1612   PROTEINUR 30 (A) 10/24/2014 1215   UROBILINOGEN 1.0 05/28/2015 1612   UROBILINOGEN 1.0 10/24/2014 1215   NITRITE neg 05/28/2015 1612   NITRITE NEGATIVE 10/24/2014 1215   LEUKOCYTESUR Negative 05/28/2015 1612   Sepsis Labs: _0 (procalcitonin:4,lacticidven:4) )No results found for this or any previous visit (from the past 240 hour(s)).   Radiological Exams on Admission: No results found.   EKG: Not done in ED, will get one.   Assessment/Plan Principal Problem:   Diarrhea Active Problems:   Alcohol abuse   HSV-1 (herpes simplex virus 1) infection   Essential hypertension   Hyperlipidemia   AKI (acute kidney injury) ([place])   Diabetes mellitus without complication ([facility])   Chronic diastolic CHF (congestive heart failure) ([facility])  Diarrhea: Etiology is not clear. Likely due to viral enteritis. Other differential diagnoses include pancreatic insufficiency given history of pancreatitis and alcohol abuse. Will also need to r/o c diff.  -will place on tele bed for obs -check c diff pcr -start Creon -IVF: 2 L normal saline, then 100 mL per hour  AKI: Likely due to prerenal secondary to dehydration and continuation of ACEI - IVF as above - Check FeNa  - Follow up renal function by BMP - Avoid ACEI and NSAIDs - Hold lisinopril  Chronic diastolic CHF: 2-D echo on 10/25/14 showed EF of 65-70 percent with grade 1 diastolic dysfunction. Patient is clinically dry. CHF is compensated. Patient not taking diuretics at home. -Continue aspirin -Check BNP  DM-II: Last A1c 12.5 on 06/18/16, poorly controled. Patient is taking Lantus and NovoLog at home. Blood sugar is elevated at 402, with normal anion gap. -will continue home dose Lantus, 30 units daily -SSI  HTN: -Hold lisinopril due to acute renal injury -IV hydralazine when necessary  Alcohol abuse: -Did counseling about the importance of quitting drinking -CIWA protocol  HLD: Last LDL was 55  on 10/25/14 -Continue home medications: Lipitor  HSV-1 (herpes simplex virus 1) infection -continue home Valtrex  DVT ppx:  SQ Lovenox Code Status: Full code Family Communication:  Yes, patient's dughter at bed side Disposition Plan:  Anticipate discharge back to previous home environment Consults called:  none Admission status: Obs / tele    Date of Service 06/19/2016    Taylor Spilde, [facility] Pager [phone]  If 7PM-7AM, please contact night-coverage [email or web address] Password [email or web address]  06/19/2016, 1:03 AM

## 2016-06-18 NOTE — ED Triage Notes (Signed)
Pt reports cbg>500 x 2 days. Reports fatigue, generalized weakness, thirst, difficulty ambulating. Denies n/v

## 2016-06-19 DIAGNOSIS — N179 Acute kidney failure, unspecified: Secondary | ICD-10-CM | POA: Diagnosis not present

## 2016-06-19 DIAGNOSIS — E1165 Type 2 diabetes mellitus with hyperglycemia: Secondary | ICD-10-CM | POA: Diagnosis not present

## 2016-06-19 DIAGNOSIS — A6000 Herpesviral infection of urogenital system, unspecified: Secondary | ICD-10-CM | POA: Diagnosis not present

## 2016-06-19 DIAGNOSIS — R197 Diarrhea, unspecified: Secondary | ICD-10-CM | POA: Diagnosis not present

## 2016-06-19 DIAGNOSIS — E860 Dehydration: Secondary | ICD-10-CM | POA: Diagnosis not present

## 2016-06-19 DIAGNOSIS — F1020 Alcohol dependence, uncomplicated: Secondary | ICD-10-CM | POA: Diagnosis not present

## 2016-06-19 DIAGNOSIS — I110 Hypertensive heart disease with heart failure: Secondary | ICD-10-CM | POA: Diagnosis not present

## 2016-06-19 DIAGNOSIS — E1140 Type 2 diabetes mellitus with diabetic neuropathy, unspecified: Secondary | ICD-10-CM | POA: Diagnosis not present

## 2016-06-19 DIAGNOSIS — I5032 Chronic diastolic (congestive) heart failure: Secondary | ICD-10-CM | POA: Diagnosis not present

## 2016-06-19 LAB — CBC
HCT: 29.2 % — ABNORMAL LOW (ref 39.0–52.0)
Hemoglobin: 9.9 g/dL — ABNORMAL LOW (ref 13.0–17.0)
MCH: 30.8 pg (ref 26.0–34.0)
MCHC: 33.9 g/dL (ref 30.0–36.0)
MCV: 91 fL (ref 78.0–100.0)
PLATELETS: 144 10*3/uL — AB (ref 150–400)
RBC: 3.21 MIL/uL — ABNORMAL LOW (ref 4.22–5.81)
RDW: 13.8 % (ref 11.5–15.5)
WBC: 6.3 10*3/uL (ref 4.0–10.5)

## 2016-06-19 LAB — BASIC METABOLIC PANEL
Anion gap: 8 (ref 5–15)
BUN: 30 mg/dL — AB (ref 6–20)
CHLORIDE: 104 mmol/L (ref 101–111)
CO2: 19 mmol/L — AB (ref 22–32)
CREATININE: 2.58 mg/dL — AB (ref 0.61–1.24)
Calcium: 8.3 mg/dL — ABNORMAL LOW (ref 8.9–10.3)
GFR calc Af Amer: 30 mL/min — ABNORMAL LOW (ref >60)
GFR calc non Af Amer: 26 mL/min — ABNORMAL LOW (ref >60)
Glucose, Bld: 274 mg/dL — ABNORMAL HIGH (ref 65–99)
Potassium: 3.9 mmol/L (ref 3.5–5.1)
SODIUM: 131 mmol/L — AB (ref 135–145)

## 2016-06-19 LAB — URINALYSIS, ROUTINE W REFLEX MICROSCOPIC
Bilirubin Urine: NEGATIVE
Glucose, UA: >=500 mg/dL — AB
KETONES UR: 5 mg/dL — AB
Leukocytes, UA: NEGATIVE
Nitrite: NEGATIVE
PH: 5 (ref 5.0–8.0)
Protein, ur: NEGATIVE mg/dL
SPECIFIC GRAVITY, URINE: 1.014 (ref 1.005–1.030)
Squamous Epithelial / HPF: NONE SEEN

## 2016-06-19 LAB — GLUCOSE, CAPILLARY
GLUCOSE-CAPILLARY: 313 mg/dL — AB (ref 65–99)
Glucose-Capillary: 250 mg/dL — ABNORMAL HIGH (ref 65–99)
Glucose-Capillary: 290 mg/dL — ABNORMAL HIGH (ref 65–99)
Glucose-Capillary: 334 mg/dL — ABNORMAL HIGH (ref 65–99)
Glucose-Capillary: 380 mg/dL — ABNORMAL HIGH (ref 65–99)

## 2016-06-19 LAB — HEPATIC FUNCTION PANEL
ALBUMIN: 2.9 g/dL — AB (ref 3.5–5.0)
ALT: 12 U/L — ABNORMAL LOW (ref 17–63)
AST: 16 U/L (ref 15–41)
Alkaline Phosphatase: 53 U/L (ref 38–126)
Bilirubin, Direct: 0.2 mg/dL (ref 0.1–0.5)
Indirect Bilirubin: 0.7 mg/dL (ref 0.3–0.9)
Total Bilirubin: 0.9 mg/dL (ref 0.3–1.2)
Total Protein: 6.1 g/dL — ABNORMAL LOW (ref 6.5–8.1)

## 2016-06-19 LAB — C DIFFICILE QUICK SCREEN W PCR REFLEX
C DIFFICILE (CDIFF) INTERP: NOT DETECTED
C DIFFICILE (CDIFF) TOXIN: NEGATIVE
C Diff antigen: NEGATIVE

## 2016-06-19 LAB — CREATININE, URINE, RANDOM: Creatinine, Urine: 101.79 mg/dL

## 2016-06-19 LAB — SODIUM, URINE, RANDOM: Sodium, Ur: 38 mmol/L

## 2016-06-19 LAB — MRSA PCR SCREENING: MRSA by PCR: POSITIVE — AB

## 2016-06-19 LAB — LIPASE, BLOOD: LIPASE: 15 U/L (ref 11–51)

## 2016-06-19 MED ORDER — INSULIN ASPART 100 UNIT/ML ~~LOC~~ SOLN
0.0000 [IU] | Freq: Every day | SUBCUTANEOUS | Status: DC
  Administered 2016-06-19: 9 [IU] via SUBCUTANEOUS
  Administered 2016-06-19: 10 [IU] via SUBCUTANEOUS

## 2016-06-19 MED ORDER — MUPIROCIN 2 % EX OINT
1.0000 "application " | TOPICAL_OINTMENT | Freq: Two times a day (BID) | CUTANEOUS | Status: DC
  Administered 2016-06-19 – 2016-06-20 (×3): 1 via NASAL
  Filled 2016-06-19: qty 22

## 2016-06-19 MED ORDER — LORAZEPAM 2 MG/ML IJ SOLN
0.0000 mg | Freq: Two times a day (BID) | INTRAMUSCULAR | Status: DC
Start: 2016-06-20 — End: 2016-06-20

## 2016-06-19 MED ORDER — LORAZEPAM 1 MG PO TABS: 1.0000 mg | ORAL_TABLET | Freq: Four times a day (QID) | ORAL | Status: DC | PRN

## 2016-06-19 MED ORDER — INSULIN ASPART 100 UNIT/ML ~~LOC~~ SOLN: 10.0000 [IU] | Freq: Once | SUBCUTANEOUS | Status: DC

## 2016-06-19 MED ORDER — INSULIN ASPART 100 UNIT/ML ~~LOC~~ SOLN: 0.0000 [IU] | Freq: Every day | SUBCUTANEOUS | Status: DC

## 2016-06-19 MED ORDER — LORAZEPAM 2 MG/ML IJ SOLN: 0.0000 mg | Freq: Four times a day (QID) | INTRAMUSCULAR | Status: DC

## 2016-06-19 MED ORDER — LORAZEPAM 2 MG/ML IJ SOLN: 1.0000 mg | Freq: Four times a day (QID) | INTRAMUSCULAR | Status: DC | PRN

## 2016-06-19 MED ORDER — CHLORHEXIDINE GLUCONATE CLOTH 2 % EX PADS
6.0000 | MEDICATED_PAD | Freq: Every day | CUTANEOUS | Status: DC
  Administered 2016-06-19 – 2016-06-20 (×2): 6 via TOPICAL

## 2016-06-19 MED ORDER — INSULIN ASPART 100 UNIT/ML ~~LOC~~ SOLN
0.0000 [IU] | Freq: Three times a day (TID) | SUBCUTANEOUS | Status: DC
  Administered 2016-06-19: 7 [IU] via SUBCUTANEOUS
  Administered 2016-06-19: 5 [IU] via SUBCUTANEOUS
  Administered 2016-06-19: 3 [IU] via SUBCUTANEOUS
  Administered 2016-06-20: 5 [IU] via SUBCUTANEOUS
  Administered 2016-06-20: 3 [IU] via SUBCUTANEOUS

## 2016-06-19 NOTE — Progress Notes (Signed)
Note: This RN tried to speak with patient at bedside, however patient had head covered and would not wake up.  This RN, instead, spoke with bedside RN.  Bedside RN states patient says he takes his CBG's TID with meals.  However, blood glucose at admission was 493 mg/dL.  Will try to speak to patient tomorrow.   Thank you,  Windy Carina, RN, MSN Diabetes Coordinator Inpatient Diabetes Program [phone] (Team Pager)

## 2016-06-19 NOTE — Progress Notes (Signed)
Inpatient Diabetes Program Recommendations  AACE/ADA: New Consensus Statement on Inpatient Glycemic Control (2015)  Target Ranges:  Prepandial:   less than 140 mg/dL      Peak postprandial:   less than 180 mg/dL (1-2 hours)      Critically ill patients:  140 - 180 mg/dL   Lab Results  Component Value Date   GLUCAP 313 (H) 06/19/2016   HGBA1C 12.5 06/13/2016    Review of Glycemic Control:  Results for EMJAY, BARIS (MRN [id number]) as of 06/19/2016 12:41  Ref. Range 06/18/2016 21:32 06/18/2016 23:04 06/19/2016 00:07 06/19/2016 07:04 06/19/2016 11:47  Glucose-Capillary Latest Ref Range: 65 - 99 mg/dL 493 (H) 345 (H) 334 (H) 290 (H) 313 (H)   Diabetes history: Type 2 diabetes Outpatient Diabetes medications: Lantus 30 units daily, Novolog correction Current orders for Inpatient glycemic control:  Lantus 30 units daily, Novolog sensitive tid with meals and HS  Inpatient Diabetes Program Recommendations:   Note home Lantus dose restarted this AM.  Will talk with patient regarding elevated A1C.  Please consider adding Novolog meal coverage 5 units tid with meals.   Thanks, Adah Perl, RN, BC-ADM Inpatient Diabetes Coordinator Pager [phone] (8a-5p)

## 2016-06-19 NOTE — Progress Notes (Signed)
PROGRESS NOTE  Melvin [name] [id number] DOB: Dec 02, 1960 DOA: 06/18/2016 PCP: Arnoldo Morale, MD   LOS: 0 days   Brief Narrative: 56 y.o. male with medical history significant of hypertension, diabetes mellitus, hyperlipidemia, prior pancreatitis, alcohol abuse, cocaine abuse, dCHF, who presents with generalized weakness and diarrhea.  Assessment & Plan: Principal Problem:   Diarrhea Active Problems:   Alcohol abuse   HSV-1 (herpes simplex virus 1) infection   Essential hypertension   Hyperlipidemia   AKI (acute kidney injury) ([place])   Diabetes mellitus without complication (H[facility])   Chronic diastolic CHF (congestive heart failure) ([facility])   Diarrhea  - Etiology is not clear. Likely due to viral enteritis versus a component of chronic pancreatitis. - C. difficile is pending - Continue Creon - Continue IV fluids   AKI - Prior normal renal function, this is likely in the setting of volume depletion due to ongoing diarrhea. Continue IV fluids, creatinine is improving today, 3.4 on admission, is 2.5 this morning. - Avoid ACEI and NSAIDs - Hold lisinopril  Chronic diastolic CHF - 2-D echo on 10/25/14 showed EF of 65-70 percent with grade 1 diastolic dysfunction. Patient is clinically dry. CHF is compensated. Patient not taking diuretics at home. - Monitor fluid status, he still appears volume down this morning  DM-II - Last A1c 12.5 on 06/18/16, poorly controled - Continue Lantus and sliding scale  HTN - Hold lisinopril due to acute renal injury - IV hydralazine when necessary  Alcohol abuse - Did counseling about the importance of quitting drinking - CIWA protocol - We'll check lipase levels and hepatic function panel  HLD  - Last LDL was 55 on 10/25/14 - Continue home medications: Lipitor  HSV-1 (herpes simplex virus 1) infection - continue home Valtrex   DVT prophylaxis: Lovenox Code Status: Full  Family Communication: no family at bedside Disposition  Plan: TBD  Consultants:   None   Procedures:   None   Antimicrobials:  None    Subjective: - no chest pain, shortness of breath, no abdominal pain, nausea or vomiting. Feeling better, has not had any bowel movements overnight  Objective: Vitals:   06/18/16 2130 06/18/16 2345 06/19/16 0518 06/19/16 0800  BP: 140/78 103/65 108/60 98/62  Pulse: 105 (!) 106 97 92  Resp: 11 16 18 18   Temp:  98.6 F (37 C) 100.1 F (37.8 C) 98.6 F (37 C)  TempSrc:  Oral Oral Oral  SpO2: 100% 99% 98% 96%  Weight:  73.7 kg (162 lb 6.4 oz) 75.2 kg (165 lb 11.2 oz)   Height:  5\' 8"  (1.727 m)      Intake/Output Summary (Last 24 hours) at 06/19/16 1053 Last data filed at 06/19/16 0927  Gross per 24 hour  Intake              560 ml  Output              475 ml  Net               85 ml   Filed Weights   06/18/16 2345 06/19/16 0518  Weight: 73.7 kg (162 lb 6.4 oz) 75.2 kg (165 lb 11.2 oz)    Examination: Constitutional: NAD Vitals:   06/18/16 2130 06/18/16 2345 06/19/16 0518 06/19/16 0800  BP: 140/78 103/65 108/60 98/62  Pulse: 105 (!) 106 97 92  Resp: 11 16 18 18   Temp:  98.6 F (37 C) 100.1 F (37.8 C) 98.6 F (37 C)  TempSrc:  Oral  Oral Oral  SpO2: 100% 99% 98% 96%  Weight:  73.7 kg (162 lb 6.4 oz) 75.2 kg (165 lb 11.2 oz)   Height:  5\' 8"  (1.727 m)     Eyes: PERRL, lids and conjunctivae normal ENMT: Mucous membranes are moist. No oropharyngeal exudates Respiratory: clear to auscultation bilaterally, no wheezing, no crackles.  Cardiovascular: Regular rate and rhythm, no murmurs / rubs / gallops. No LE edema.  Abdomen: no tenderness. Bowel sounds positive.  Musculoskeletal: no clubbing / cyanosis.  Skin: no rashes, lesions, ulcers. No induration Neurologic: non focal    Data Reviewed: I have personally reviewed following labs and imaging studies  CBC:  Recent Labs Lab 06/18/16 1538 06/19/16 0305  WBC 7.2 6.3  HGB 11.6* 9.9*  HCT 33.6* 29.2*  MCV 90.8 91.0  PLT 156  123456*   Basic Metabolic Panel:  Recent Labs Lab 06/18/16 1538 06/19/16 0305  NA 130* 131*  K 4.3 3.9  CL 99* 104  CO2 18* 19*  GLUCOSE 402* 274*  BUN 31* 30*  CREATININE 3.43* 2.58*  CALCIUM 9.2 8.3*   GFR: Estimated Creatinine Clearance: 30.6 mL/min (by C-G formula based on SCr of 2.58 mg/dL (H)). Liver Function Tests: No results for input(s): AST, ALT, ALKPHOS, BILITOT, PROT, ALBUMIN in the last 168 hours. No results for input(s): LIPASE, AMYLASE in the last 168 hours. No results for input(s): AMMONIA in the last 168 hours. Coagulation Profile: No results for input(s): INR, PROTIME in the last 168 hours. Cardiac Enzymes: No results for input(s): CKTOTAL, CKMB, CKMBINDEX, TROPONINI in the last 168 hours. BNP (last 3 results) No results for input(s): PROBNP in the last 8760 hours. HbA1C: No results for input(s): HGBA1C in the last 72 hours. CBG:  Recent Labs Lab 06/18/16 2132 06/18/16 2304 06/19/16 0007 06/19/16 0704  GLUCAP 493* 345* 334* 290*   Lipid Profile: No results for input(s): CHOL, HDL, LDLCALC, TRIG, CHOLHDL, LDLDIRECT in the last 72 hours. Thyroid Function Tests: No results for input(s): TSH, T4TOTAL, FREET4, T3FREE, THYROIDAB in the last 72 hours. Anemia Panel: No results for input(s): VITAMINB12, FOLATE, FERRITIN, TIBC, IRON, RETICCTPCT in the last 72 hours. Urine analysis:    Component Value Date/Time   COLORURINE YELLOW 06/19/2016 0158   APPEARANCEUR CLEAR 06/19/2016 0158   LABSPEC 1.014 06/19/2016 0158   PHURINE 5.0 06/19/2016 0158   GLUCOSEU >=500 (A) 06/19/2016 0158   HGBUR LARGE (A) 06/19/2016 0158   BILIRUBINUR NEGATIVE 06/19/2016 0158   BILIRUBINUR small 05/28/2015 1612   KETONESUR 5 (A) 06/19/2016 0158   PROTEINUR NEGATIVE 06/19/2016 0158   UROBILINOGEN 1.0 05/28/2015 1612   UROBILINOGEN 1.0 10/24/2014 1215   NITRITE NEGATIVE 06/19/2016 0158   LEUKOCYTESUR NEGATIVE 06/19/2016 0158   Sepsis Labs: Invalid input(s): PROCALCITONIN,  LACTICIDVEN  Recent Results (from the past 240 hour(s))  MRSA PCR Screening     Status: Abnormal   Collection Time: 06/18/16 11:55 PM  Result Value Ref Range Status   MRSA by PCR POSITIVE (A) NEGATIVE Final    Comment: RESULT CALLED TO, READ BACK BY AND VERIFIED WITH: DALLAS HART,RN @0415  06/19/16 MKELLY,MLT        The GeneXpert MRSA Assay (FDA approved for NASAL specimens only), is one component of a comprehensive MRSA colonization surveillance program. It is not intended to diagnose MRSA infection nor to guide or monitor treatment for MRSA infections.       Radiology Studies: No results found.   Scheduled Meds: . aspirin EC  81 mg Oral Daily  .  atorvastatin  10 mg Oral q1800  . Chlorhexidine Gluconate Cloth  6 each Topical Q0600  . clotrimazole  1 application Topical BID  . enoxaparin (LOVENOX) injection  40 mg Subcutaneous Q24H  . folic acid  1 mg Oral Daily  . gabapentin  600 mg Oral BID  . insulin aspart  0-10 Units Subcutaneous QHS  . insulin aspart  0-9 Units Subcutaneous TID WC  . insulin glargine  30 Units Subcutaneous Daily  . lipase/protease/amylase  12,000 Units Oral TID AC  . LORazepam  0-4 mg Intravenous Q6H   Followed by  . [START ON 06/20/2016] LORazepam  0-4 mg Intravenous Q12H  . multivitamin with minerals  1 tablet Oral Daily  . mupirocin ointment  1 application Nasal BID  . sodium chloride flush  3 mL Intravenous Q12H  . thiamine  100 mg Oral Daily   Or  . thiamine  100 mg Intravenous Daily  . valACYclovir  500 mg Oral BID   Continuous Infusions: . sodium chloride 100 mL/hr at 06/19/16 0146    Marzetta Board, MD, PhD [facility] Pager [phone]  If 7PM-7AM, please contact night-coverage [email or web address] Password [email or web address] 06/19/2016, 10:53 AM

## 2016-06-19 NOTE — Progress Notes (Addendum)
MRSA PCR positive. Order set applied. Enteric precautions still to be ruled out via C.diff specimen, which is yet to be obtained as pt has not had any BM since admission. WBC unremarkable, however temp = 100.1 this AM. Patient without abdominal pain. Will continue to monitor.

## 2016-06-19 NOTE — Progress Notes (Signed)
C.diff PCR negative. Enteric precautions discontinued, however pt remains on Contact precautions for positive MRSA PCR. Will continue to monitor.

## 2016-06-20 ENCOUNTER — Other Ambulatory Visit: Payer: Medicare HMO

## 2016-06-20 DIAGNOSIS — R197 Diarrhea, unspecified: Secondary | ICD-10-CM | POA: Diagnosis not present

## 2016-06-20 LAB — COMPREHENSIVE METABOLIC PANEL
ALK PHOS: 55 U/L (ref 38–126)
ALT: 13 U/L — AB (ref 17–63)
AST: 15 U/L (ref 15–41)
Albumin: 2.7 g/dL — ABNORMAL LOW (ref 3.5–5.0)
Anion gap: 6 (ref 5–15)
BUN: 13 mg/dL (ref 6–20)
CALCIUM: 8.8 mg/dL — AB (ref 8.9–10.3)
CO2: 21 mmol/L — ABNORMAL LOW (ref 22–32)
CREATININE: 1.24 mg/dL (ref 0.61–1.24)
Chloride: 108 mmol/L (ref 101–111)
Glucose, Bld: 213 mg/dL — ABNORMAL HIGH (ref 65–99)
Potassium: 3.8 mmol/L (ref 3.5–5.1)
Sodium: 135 mmol/L (ref 135–145)
Total Bilirubin: 0.8 mg/dL (ref 0.3–1.2)
Total Protein: 6 g/dL — ABNORMAL LOW (ref 6.5–8.1)

## 2016-06-20 LAB — CBC
HEMATOCRIT: 29.9 % — AB (ref 39.0–52.0)
HEMOGLOBIN: 10 g/dL — AB (ref 13.0–17.0)
MCH: 30.4 pg (ref 26.0–34.0)
MCHC: 33.4 g/dL (ref 30.0–36.0)
MCV: 90.9 fL (ref 78.0–100.0)
Platelets: 137 10*3/uL — ABNORMAL LOW (ref 150–400)
RBC: 3.29 MIL/uL — AB (ref 4.22–5.81)
RDW: 13.5 % (ref 11.5–15.5)
WBC: 4.2 10*3/uL (ref 4.0–10.5)

## 2016-06-20 LAB — GLUCOSE, CAPILLARY
GLUCOSE-CAPILLARY: 209 mg/dL — AB (ref 65–99)
Glucose-Capillary: 269 mg/dL — ABNORMAL HIGH (ref 65–99)

## 2016-06-20 LAB — URINE CULTURE
CULTURE: NO GROWTH
SPECIAL REQUESTS: NORMAL

## 2016-06-20 MED ORDER — PANCRELIPASE (LIP-PROT-AMYL) 12000-38000 UNITS PO CPEP: 12000.0000 [IU] | ORAL_CAPSULE | Freq: Three times a day (TID) | ORAL | 1 refills | Status: DC

## 2016-06-20 NOTE — Progress Notes (Signed)
Pt has orders to be discharged. Discharge instructions given and pt has no additional questions at this time. Medication regimen reviewed and pt educated. Pt verbalized understanding and has no additional questions. Telemetry box removed. IV removed and site in good condition. Pt stable and waiting for transportation.  Zerah Hilyer RN 

## 2016-06-20 NOTE — Discharge Instructions (Signed)
Follow with Arnoldo Morale, MD in 5-7 days  Please get a complete blood count and chemistry panel checked by your Primary MD at your next visit, and again as instructed by your Primary MD. Please get your medications reviewed and adjusted by your Primary MD.  Please request your Primary MD to go over all Hospital Tests and Procedure/Radiological results at the follow up, please get all Hospital records sent to your Prim MD by signing hospital release before you go home.  If you had Pneumonia of Lung problems at the Hospital: Please get a 2 view Chest X ray done in 6-8 weeks after hospital discharge or sooner if instructed by your Primary MD.  If you have Congestive Heart Failure: Please call your Cardiologist or Primary MD anytime you have any of the following symptoms:  1) 3 pound weight gain in 24 hours or 5 pounds in 1 week  2) shortness of breath, with or without a dry hacking cough  3) swelling in the hands, feet or stomach  4) if you have to sleep on extra pillows at night in order to breathe  Follow cardiac low salt diet and 1.5 lit/day fluid restriction.  If you have diabetes Accuchecks 4 times/day, Once in AM empty stomach and then before each meal. Log in all results and show them to your primary doctor at your next visit. If any glucose reading is under 80 or above 300 call your primary MD immediately.  If you have Seizure/Convulsions/Epilepsy: Please do not drive, operate heavy machinery, participate in activities at heights or participate in high speed sports until you have seen by Primary MD or a Neurologist and advised to do so again.  If you had Gastrointestinal Bleeding: Please ask your Primary MD to check a complete blood count within one week of discharge or at your next visit. Your endoscopic/colonoscopic biopsies that are pending at the time of discharge, will also need to followed by your Primary MD.  Get Medicines reviewed and adjusted. Please take all your  medications with you for your next visit with your Primary MD  Please request your Primary MD to go over all hospital tests and procedure/radiological results at the follow up, please ask your Primary MD to get all Hospital records sent to his/her office.  If you experience worsening of your admission symptoms, develop shortness of breath, life threatening emergency, suicidal or homicidal thoughts you must seek medical attention immediately by calling 911 or calling your MD immediately  if symptoms less severe.  You must read complete instructions/literature along with all the possible adverse reactions/side effects for all the Medicines you take and that have been prescribed to you. Take any new Medicines after you have completely understood and accpet all the possible adverse reactions/side effects.   Do not drive or operate heavy machinery when taking Pain medications.   Do not take more than prescribed Pain, Sleep and Anxiety Medications  Special Instructions: If you have smoked or chewed Tobacco  in the last 2 yrs please stop smoking, stop any regular Alcohol  and or any Recreational drug use.  Wear Seat belts while driving.  Please note You were cared for by a hospitalist during your hospital stay. If you have any questions about your discharge medications or the care you received while you were in the hospital after you are discharged, you can call the unit and asked to speak with the hospitalist on call if the hospitalist that took care of you is not available. Once  you are discharged, your primary care physician will handle any further medical issues. Please note that NO REFILLS for any discharge medications will be authorized once you are discharged, as it is imperative that you return to your primary care physician (or establish a relationship with a primary care physician if you do not have one) for your aftercare needs so that they can reassess your need for medications and monitor your  lab values.  You can reach the hospitalist office at phone [phone] or fax [phone]   If you do not have a primary care physician, you can call [phone] for a physician referral.  Activity: As tolerated with Full fall precautions use walker/cane & assistance as needed  Diet: diabetic  Disposition Home

## 2016-06-20 NOTE — Progress Notes (Signed)
Inpatient Diabetes Program Recommendations  AACE/ADA: New Consensus Statement on Inpatient Glycemic Control (2015)  Target Ranges:  Prepandial:   less than 140 mg/dL      Peak postprandial:   less than 180 mg/dL (1-2 hours)      Critically ill patients:  140 - 180 mg/dL   Results for Melvin [name], Melvin [name] (MRN [id number]) as of 06/20/2016 13:34  Ref. Range 06/13/2016 09:54  Hemoglobin A1C Unknown 12.5    Spoke with patient about his current A1c of 12.5%.  Explained what an A1c is and what it measures.  Reminded patient that his goal A1c is 7% or less per ADA standards to prevent both acute and long-term complications.  Explained to patient the extreme importance of good glucose control at home.  Encouraged patient to check his CBGs at least TID AC at home and to record all CBGs in a logbook for his PCP or to review.  Patient told me he is usually very good about taking his insulin at home.  Will occasionally forget 12pm dose of Novolog.  Has CBG meter and supplies at home.  Stated he knows he needs to keep a CBG record for the MD.  Encouraged pt to always take CBg log book to all MD appts so MD can review CBGs and make adjustments.  Patient stated he drinks mostly water and tries to eat a healthy diet.  Did not have any specific questions about his nutrition plan at home.  Pt to d/c home today.   --Will follow patient during hospitalization--  Wyn Quaker RN, MSN, CDE Diabetes Coordinator Inpatient Glycemic Control Team Team Pager: [phone] (8a-5p)

## 2016-06-20 NOTE — Discharge Summary (Signed)
Physician Discharge Summary  Melvin [name] [id number] DOB: [birth year] DOA: 06/18/2016  PCP: Arnoldo Morale, MD  Admit date: 06/18/2016 Discharge date: 06/20/2016  Admitted From: home Disposition:  home  Recommendations for Outpatient Follow-up:  1. Follow up with PCP in 1-2 weeks 2. Please obtain BMP/CBC in one week  Home Health: none Equipment/Devices: none  Discharge Condition: stable CODE STATUS: Full Diet recommendation: diabetic  HPI: Per Dr. Blaine Hamper, Melvin [name] is a 56 y.o. male with medical history significant of hypertension, diabetes mellitus, hyperlipidemia, pancreatitis, meningitis, alcohol abuse, cocaine abuse, dCHF, who presents with generalized weakness and diarrhea. Patient states that he has been having intermittent diarrhea for about 4 weeks. He has nausea, occasionally vomiting. No abdominal pain. He has 2-3 bowel movement with loose stool each day. He has generalized weakness recently, and feels thirsty all the time. Denies dizziness or lightheadedness. Patient does not have chest pain, shortness breath, cough, symptoms of UTI or unilateral weakness. He has chills, but no fever. Patient states that his blood sugar has been elevated. He states that he has been compliant to insulin injection. ED Course: pt was found to have WBC 7.2, blood sugar 402 with AG of 13, AKI with Cre 3,43, temperature normal, slight tachycardia, oxygen saturation 100% on room air. Patient is placed on telemetry bed for observation.  Hospital Course: Discharge Diagnoses:  Principal Problem:   Diarrhea Active Problems:   Alcohol abuse   HSV-1 (herpes simplex virus 1) infection   Essential hypertension   Hyperlipidemia   AKI (acute kidney injury) ([place])   Diabetes mellitus without complication (HCC)   Chronic diastolic CHF (congestive heart failure) (HCC)  Diarrhea- Etiology is not clear. Likely due to viral enteritis versus a component of chronic pancreatitis. C. difficile was  checked and returned negative. He was started on creon, his diarrhea improved, able to eat without abdominal pain, no nausea/vomiting.   AKI - Prior normal renal function, this is likely in the setting of volume depletion due to ongoing diarrhea. Received IVF and his Cr returned to normal. Encouraged ongoing hydration at home. Hold lisinopril on d/c. Chronic diastolic CHF - 2-D echo on 10/25/14 showed EF of 65-70 percent with grade 1 diastolic dysfunction. Patient is clinically dry. CHF is compensated. Patient not taking diuretics at home. Euvolemic on d/c DM-II - Last A1c 12.5 on 06/18/16, poorly controled, DM educator consulted. Patient with poor insight based on my conversation with him.  HTN - Hold lisinopril due to acute renal injury Alcohol abuse - Did counseling about the importance of quitting drinking HLD- on Lipitor HSV-1 (herpes simplex virus 1) infection - continue home Valtrex    Discharge Instructions   Allergies as of 06/20/2016   No Known Allergies     Medication List    STOP taking these medications   lisinopril 2.5 MG tablet Commonly known as:  PRINIVIL,ZESTRIL     TAKE these medications   ACCU-CHEK SMARTVIEW test strip Generic drug:  glucose blood USE AS INSTRUCTED   aspirin 81 MG EC tablet Take 1 tablet (81 mg total) by mouth daily.   atorvastatin 10 MG tablet Commonly known as:  LIPITOR Take 1 tablet (10 mg total) by mouth daily at 6 PM.   B-D INS SYRINGE 0.5CC/31GX5/16 31G X 5/16" 0.5 ML Misc Generic drug:  Insulin Syringe-Needle U-100 USE TO INJECT INSULIN 3 TIMES A DAY AND AT BEDTIME   clotrimazole 1 % cream Commonly known as:  LOTRIMIN Apply 1 application topically 2 (two) times  daily.   gabapentin 300 MG capsule Commonly known as:  NEURONTIN TAKE 2 CAPSULES (600 MG TOTAL) BY MOUTH 2 (TWO) TIMES DAILY.   insulin aspart 100 UNIT/ML injection Commonly known as:  novoLOG 150-200, give 3 units 201-250 give 5 units 251-300 give 7 units 301-350  give 9 units 351-400 give 12 units   LANTUS 100 UNIT/ML injection Generic drug:  insulin glargine INJECT 0.3 MLS (30 UNITS TOTAL) INTO THE SKIN DAILY.   lipase/protease/amylase 12000 units Cpep capsule Commonly known as:  CREON Take 1 capsule (12,000 Units total) by mouth 3 (three) times daily before meals.   TRUE METRIX METER w/Device Kit 1 each by Does not apply route 4 (four) times daily -  before meals and at bedtime.   TRUEPLUS LANCETS 28G Misc 1 each by Does not apply route 4 (four) times daily -  before meals and at bedtime.   valACYclovir 500 MG tablet Commonly known as:  VALTREX Take 1 tablet (500 mg total) by mouth 2 (two) times daily.      Follow-up Information    Arnoldo Morale, MD. Schedule an appointment as soon as possible for a visit in 1 week(s).   Specialty:  Family Medicine Contact information: [place] [address] [phone]          No Known Allergies  Consultations:  None   Procedures/Studies:   No results found.   Subjective: - no chest pain, shortness of breath, no abdominal pain, nausea or vomiting.   Discharge Exam: Vitals:   06/19/16 2348 06/20/16 0411  BP: 93/62 117/72  Pulse: 85 86  Resp: 18 18  Temp: 98.3 F (36.8 C) 98.7 F (37.1 C)   Vitals:   06/19/16 1643 06/19/16 2018 06/19/16 2348 06/20/16 0411  BP: 101/64 125/79 93/62 117/72  Pulse: 88 93 85 86  Resp: _0 Temp: 98.6 F (37 C) 98.2 F (36.8 C) 98.3 F (36.8 C) 98.7 F (37.1 C)  TempSrc: Oral Oral Oral Oral  SpO2: 98% 99% 99% 99%  Weight:    77 kg (169 lb 11.2 oz)  Height:        General: Pt is alert, awake, not in acute distress Cardiovascular: RRR, S1/S2 +, no rubs, no gallops Respiratory: CTA bilaterally, no wheezing, no rhonchi Abdominal: Soft, NT, ND, bowel sounds + Extremities: no edema, no cyanosis    The results of significant diagnostics from this hospitalization (including imaging, microbiology, ancillary and  laboratory) are listed below for reference.     Microbiology: Recent Results (from the past 240 hour(s))  MRSA PCR Screening     Status: Abnormal   Collection Time: 06/18/16 11:55 PM  Result Value Ref Range Status   MRSA by PCR POSITIVE (A) NEGATIVE Final    Comment: RESULT CALLED TO, READ BACK BY AND VERIFIED WITH: DALLAS HART,RN _1  06/19/16 MKELLY,MLT        The GeneXpert MRSA Assay (FDA approved for NASAL specimens only), is one component of a comprehensive MRSA colonization surveillance program. It is not intended to diagnose MRSA infection nor to guide or monitor treatment for MRSA infections.   Culture, Urine     Status: None   Collection Time: 06/19/16  1:58 AM  Result Value Ref Range Status   Specimen Description URINE, CLEAN CATCH  Final   Special Requests n/a Normal  Final   Culture NO GROWTH  Final   Report Status 06/20/2016 FINAL  Final  C difficile quick scan w PCR  reflex     Status: None   Collection Time: 06/19/16  6:24 PM  Result Value Ref Range Status   C Diff antigen NEGATIVE NEGATIVE Final   C Diff toxin NEGATIVE NEGATIVE Final   C Diff interpretation No C. difficile detected.  Final     Labs: BNP (last 3 results)  Recent Labs  06/18/16 1538  BNP 9.6   Basic Metabolic Panel:  Recent Labs Lab 06/18/16 1538 06/19/16 0305 06/20/16 0508  NA 130* 131* 135  K 4.3 3.9 3.8  CL 99* 104 108  CO2 18* 19* 21*  GLUCOSE 402* 274* 213*  BUN 31* 30* 13  CREATININE 3.43* 2.58* 1.24  CALCIUM 9.2 8.3* 8.8*   Liver Function Tests:  Recent Labs Lab 06/19/16 1057 06/20/16 0508  AST 16 15  ALT 12* 13*  ALKPHOS 53 55  BILITOT 0.9 0.8  PROT 6.1* 6.0*  ALBUMIN 2.9* 2.7*    Recent Labs Lab 06/19/16 1057  LIPASE 15   No results for input(s): AMMONIA in the last 168 hours. CBC:  Recent Labs Lab 06/18/16 1538 06/19/16 0305 06/20/16 0508  WBC 7.2 6.3 4.2  HGB 11.6* 9.9* 10.0*  HCT 33.6* 29.2* 29.9*  MCV 90.8 91.0 90.9  PLT 156 144*  137*   Cardiac Enzymes: No results for input(s): CKTOTAL, CKMB, CKMBINDEX, TROPONINI in the last 168 hours. BNP: Invalid input(s): POCBNP CBG:  Recent Labs Lab 06/19/16 1147 06/19/16 1641 06/19/16 2109 06/20/16 0615 06/20/16 1132  GLUCAP 313* 250* 380* 209* 269*   D-Dimer No results for input(s): DDIMER in the last 72 hours. Hgb A1c No results for input(s): HGBA1C in the last 72 hours. Lipid Profile No results for input(s): CHOL, HDL, LDLCALC, TRIG, CHOLHDL, LDLDIRECT in the last 72 hours. Thyroid function studies No results for input(s): TSH, T4TOTAL, T3FREE, THYROIDAB in the last 72 hours.  Invalid input(s): FREET3 Anemia work up No results for input(s): VITAMINB12, FOLATE, FERRITIN, TIBC, IRON, RETICCTPCT in the last 72 hours. Urinalysis    Component Value Date/Time   COLORURINE YELLOW 06/19/2016 0158   APPEARANCEUR CLEAR 06/19/2016 0158   LABSPEC 1.014 06/19/2016 0158   PHURINE 5.0 06/19/2016 0158   GLUCOSEU >=500 (A) 06/19/2016 0158   HGBUR LARGE (A) 06/19/2016 0158   BILIRUBINUR NEGATIVE 06/19/2016 0158   BILIRUBINUR small 05/28/2015 1612   KETONESUR 5 (A) 06/19/2016 0158   PROTEINUR NEGATIVE 06/19/2016 0158   UROBILINOGEN 1.0 05/28/2015 1612   UROBILINOGEN 1.0 10/24/2014 1215   NITRITE NEGATIVE 06/19/2016 0158   LEUKOCYTESUR NEGATIVE 06/19/2016 0158   Sepsis Labs Invalid input(s): PROCALCITONIN,  WBC,  LACTICIDVEN Microbiology Recent Results (from the past 240 hour(s))  MRSA PCR Screening     Status: Abnormal   Collection Time: 06/18/16 11:55 PM  Result Value Ref Range Status   MRSA by PCR POSITIVE (A) NEGATIVE Final    Comment: RESULT CALLED TO, READ BACK BY AND VERIFIED WITH: DALLAS HART,RN _0  06/19/16 MKELLY,MLT        The GeneXpert MRSA Assay (FDA approved for NASAL specimens only), is one component of a comprehensive MRSA colonization surveillance program. It is not intended to diagnose MRSA infection nor to guide or monitor treatment  for MRSA infections.   Culture, Urine     Status: None   Collection Time: 06/19/16  1:58 AM  Result Value Ref Range Status   Specimen Description URINE, CLEAN CATCH  Final   Special Requests n/a Normal  Final   Culture NO GROWTH  Final   Report  Status 06/20/2016 FINAL  Final  C difficile quick scan w PCR reflex     Status: None   Collection Time: 06/19/16  6:24 PM  Result Value Ref Range Status   C Diff antigen NEGATIVE NEGATIVE Final   C Diff toxin NEGATIVE NEGATIVE Final   C Diff interpretation No C. difficile detected.  Final     Time coordinating discharge: Over 30 minutes  SIGNED:  Marzetta Board, MD  [facility] 06/20/2016, 2:03 PM Pager [phone]  If 7PM-7AM, please contact night-coverage [email or web address] Password [email or web address]

## 2016-06-24 ENCOUNTER — Encounter: Payer: Self-pay | Admitting: Family Medicine

## 2016-06-24 ENCOUNTER — Ambulatory Visit: Payer: Medicare HMO | Attending: Family Medicine | Admitting: Family Medicine

## 2016-06-24 VITALS — BP 122/79 | HR 86 | Temp 98.0°F | Ht 68.0 in | Wt 175.8 lb

## 2016-06-24 DIAGNOSIS — K219 Gastro-esophageal reflux disease without esophagitis: Secondary | ICD-10-CM | POA: Diagnosis not present

## 2016-06-24 DIAGNOSIS — E7800 Pure hypercholesterolemia, unspecified: Secondary | ICD-10-CM | POA: Insufficient documentation

## 2016-06-24 DIAGNOSIS — I110 Hypertensive heart disease with heart failure: Secondary | ICD-10-CM | POA: Diagnosis not present

## 2016-06-24 DIAGNOSIS — I10 Essential (primary) hypertension: Secondary | ICD-10-CM | POA: Diagnosis not present

## 2016-06-24 DIAGNOSIS — E1165 Type 2 diabetes mellitus with hyperglycemia: Secondary | ICD-10-CM | POA: Insufficient documentation

## 2016-06-24 DIAGNOSIS — Z7982 Long term (current) use of aspirin: Secondary | ICD-10-CM | POA: Diagnosis not present

## 2016-06-24 DIAGNOSIS — I509 Heart failure, unspecified: Secondary | ICD-10-CM | POA: Diagnosis not present

## 2016-06-24 DIAGNOSIS — E1149 Type 2 diabetes mellitus with other diabetic neurological complication: Secondary | ICD-10-CM | POA: Diagnosis not present

## 2016-06-24 DIAGNOSIS — Z794 Long term (current) use of insulin: Secondary | ICD-10-CM | POA: Diagnosis not present

## 2016-06-24 DIAGNOSIS — Z79899 Other long term (current) drug therapy: Secondary | ICD-10-CM | POA: Insufficient documentation

## 2016-06-24 LAB — GLUCOSE, POCT (MANUAL RESULT ENTRY)
POC Glucose: 408 mg/dl — AB (ref 70–99)
POC Glucose: 341 mg/dl — AB (ref 70–99)

## 2016-06-24 MED ORDER — INSULIN GLARGINE 100 UNIT/ML ~~LOC~~ SOLN: 40.0000 [IU] | Freq: Every day | SUBCUTANEOUS | 3 refills | Status: DC

## 2016-06-24 NOTE — Progress Notes (Signed)
Subjective:  Patient ID: Melvin [name], male    DOB: 02/28/1961  Age: 56 y.o. MRN: [id number]  CC: Hospitalization Follow-up and Diabetes   HPI Melvin [name] is a 56 year old male with history of type 2 diabetes mellitus (A1c 12.5), hyperlipidemia, hypertension with recent hospitalization for viral enteritis and hyperglycemia.  C. difficile was negative and he was commenced on Creon with improvement in his diarrhea. Lisinopril discontinued due to poor renal function. He was placed on IV hydration and subsequently discharged.  His blood sugar is elevated at 408 today and he reports that fasting sugars have been in the 300s. He endorses compliance with his Lantus and his NovoLog but not with a diabetic diet. He denies diarrhea.  He has been compliant with his statin and denies any myalgias. Denies chest pains or shortness of breath He has no other acute concerns today.  Past Medical History:  Diagnosis Date  . Angina   . CHF (congestive heart failure) ([place])   . Diabetes mellitus   . GERD (gastroesophageal reflux disease)   . Hypertension   . Meningitis   . Pancreatitis   . Pneumonia     Past Surgical History:  Procedure Laterality Date  . ERCP  08/02/2011   Procedure: ENDOSCOPIC RETROGRADE CHOLANGIOPANCREATOGRAPHY (ERCP);  Surgeon: Beryle Beams, MD;  Location: [facility] ENDOSCOPY;  Service: Endoscopy;  Laterality: N/A;  . ERCP  08/22/2011   Procedure: ENDOSCOPIC RETROGRADE CHOLANGIOPANCREATOGRAPHY (ERCP);  Surgeon: Beryle Beams, MD;  Location: [facility] ENDOSCOPY;  Service: Endoscopy;  Laterality: N/A;  . TONSILLECTOMY AND ADENOIDECTOMY     "as a kid"  . TYMPANOSTOMY TUBE PLACEMENT      No Known Allergies   Outpatient Medications Prior to Visit  Medication Sig Dispense Refill  . ACCU-CHEK SMARTVIEW test strip USE AS INSTRUCTED 100 each 5  . aspirin 81 MG EC tablet Take 1 tablet (81 mg total) by mouth daily. 30 tablet 3  . B-D INS SYRINGE 0.5CC/31GX5/16 31G X 5/16" 0.5  ML MISC USE TO INJECT INSULIN 3 TIMES A DAY AND AT BEDTIME 100 each 5  . Blood Glucose Monitoring Suppl (TRUE METRIX METER) W/DEVICE KIT 1 each by Does not apply route 4 (four) times daily -  before meals and at bedtime. 1 kit 0  . clotrimazole (LOTRIMIN) 1 % cream Apply 1 application topically 2 (two) times daily. 30 g 0  . gabapentin (NEURONTIN) 300 MG capsule TAKE 2 CAPSULES (600 MG TOTAL) BY MOUTH 2 (TWO) TIMES DAILY. 120 capsule 3  . insulin aspart (NOVOLOG) 100 UNIT/ML injection 150-200, give 3 units 201-250 give 5 units 251-300 give 7 units 301-350 give 9 units 351-400 give 12 units 3 vial 5  . lipase/protease/amylase (CREON) 12000 units CPEP capsule Take 1 capsule (12,000 Units total) by mouth 3 (three) times daily before meals. 270 capsule 1  . TRUEPLUS LANCETS 28G MISC 1 each by Does not apply route 4 (four) times daily -  before meals and at bedtime. 100 each 5  . valACYclovir (VALTREX) 500 MG tablet Take 1 tablet (500 mg total) by mouth 2 (two) times daily. 14 tablet 0  . LANTUS 100 UNIT/ML injection INJECT 0.3 MLS (30 UNITS TOTAL) INTO THE SKIN DAILY. 10 mL 3  . atorvastatin (LIPITOR) 10 MG tablet Take 1 tablet (10 mg total) by mouth daily at 6 PM. (Patient not taking: Reported on 06/24/2016) 30 tablet 3   No facility-administered medications prior to visit.     ROS Review of Systems  Constitutional: Negative for activity change and appetite change.  HENT: Negative for sinus pressure and sore throat.   Eyes: Negative for visual disturbance.  Respiratory: Negative for cough, chest tightness and shortness of breath.   Cardiovascular: Negative for chest pain and leg swelling.  Gastrointestinal: Negative for abdominal distention, abdominal pain, constipation and diarrhea.  Endocrine: Negative.   Genitourinary: Negative for dysuria.  Musculoskeletal: Negative for joint swelling and myalgias.  Skin: Negative for rash.  Allergic/Immunologic: Negative.   Neurological: Negative for  weakness, light-headedness and numbness.  Psychiatric/Behavioral: Negative for dysphoric mood and suicidal ideas.    Objective:  BP 122/79 (BP Location: Right Arm, Patient Position: Sitting, Cuff Size: Small)   Pulse 86   Temp 98 F (36.7 C) (Oral)   Ht 5' 8" (1.727 m)   Wt 175 lb 12.8 oz (79.7 kg)   SpO2 100%   BMI 26.73 kg/m   BP/Weight 06/24/2016 5/46/5681 07/16/5168  Systolic BP 017 494 496  Diastolic BP 79 72 71  Wt. (Lbs) 175.8 169.7 167.8  BMI 26.73 25.8 25.51      Physical Exam  Constitutional: He is oriented to person, place, and time. He appears well-developed and well-nourished.  Cardiovascular: Normal rate, normal heart sounds and intact distal pulses.   No murmur heard. Pulmonary/Chest: Effort normal and breath sounds normal. He has no wheezes. He has no rales. He exhibits no tenderness.  Abdominal: Soft. Bowel sounds are normal. He exhibits no distension and no mass. There is no tenderness.  Musculoskeletal: Normal range of motion.  Neurological: He is alert and oriented to person, place, and time.     Lab Results  Component Value Date   HGBA1C 12.5 06/13/2016   CMP Latest Ref Rng & Units 06/20/2016 06/19/2016 06/18/2016  Glucose 65 - 99 mg/dL 213(H) 274(H) 402(H)  BUN 6 - 20 mg/dL 13 30(H) 31(H)  Creatinine 0.61 - 1.24 mg/dL 1.24 2.58(H) 3.43(H)  Sodium 135 - 145 mmol/L 135 131(L) 130(L)  Potassium 3.5 - 5.1 mmol/L 3.8 3.9 4.3  Chloride 101 - 111 mmol/L 108 104 99(L)  CO2 22 - 32 mmol/L 21(L) 19(L) 18(L)  Calcium 8.9 - 10.3 mg/dL 8.8(L) 8.3(L) 9.2  Total Protein 6.5 - 8.1 g/dL 6.0(L) 6.1(L) -  Total Bilirubin 0.3 - 1.2 mg/dL 0.8 0.9 -  Alkaline Phos 38 - 126 U/L 55 53 -  AST 15 - 41 U/L 15 16 -  ALT 17 - 63 U/L 13(L) 12(L) -    Lipid Panel     Component Value Date/Time   CHOL 163 10/25/2014 0250   TRIG 64 10/25/2014 0250   HDL 95 10/25/2014 0250   CHOLHDL 1.7 10/25/2014 0250   VLDL 13 10/25/2014 0250   LDLCALC 55 10/25/2014 0250    Assessment  & Plan:   1. Type 2 diabetes mellitus with other neurologic complication, with long-term current use of insulin (HCC) Uncontrolled with A1c of 12.5 NovoLog 20 units administered due to elevated blood sugar 408 and patient observed for 45 minutes prior to repeating blood sugar. Increased dose of Lantus from 30 units to 40 units at bedtime Repeat blood sugar log at next visit - Glucose (CBG) - insulin glargine (LANTUS) 100 UNIT/ML injection; Inject 0.4 mLs (40 Units total) into the skin daily.  Dispense: 30 mL; Refill: 3  2. Essential hypertension Controlled Blood pressure is doing well off lisinopril-we'll continue to hold off  3. Pure hypercholesterolemia Controlled Continue statin   Meds ordered this encounter  Medications  . insulin  glargine (LANTUS) 100 UNIT/ML injection    Sig: Inject 0.4 mLs (40 Units total) into the skin daily.    Dispense:  30 mL    Refill:  3    Discontinue previous dose    Follow-up: Return in about 3 weeks (around 07/15/2016) for follow up on Diabetes Mellitus.   Arnoldo Morale MD

## 2016-06-30 DIAGNOSIS — F1020 Alcohol dependence, uncomplicated: Secondary | ICD-10-CM | POA: Diagnosis not present

## 2016-07-02 ENCOUNTER — Ambulatory Visit: Payer: Medicaid Other | Admitting: Podiatry

## 2016-07-02 DIAGNOSIS — F1410 Cocaine abuse, uncomplicated: Secondary | ICD-10-CM | POA: Diagnosis not present

## 2016-07-02 DIAGNOSIS — F1020 Alcohol dependence, uncomplicated: Secondary | ICD-10-CM | POA: Diagnosis not present

## 2016-07-03 DIAGNOSIS — F1020 Alcohol dependence, uncomplicated: Secondary | ICD-10-CM | POA: Diagnosis not present

## 2016-07-03 DIAGNOSIS — F1410 Cocaine abuse, uncomplicated: Secondary | ICD-10-CM | POA: Diagnosis not present

## 2016-07-07 DIAGNOSIS — F1020 Alcohol dependence, uncomplicated: Secondary | ICD-10-CM | POA: Diagnosis not present

## 2016-07-10 DIAGNOSIS — F1410 Cocaine abuse, uncomplicated: Secondary | ICD-10-CM | POA: Diagnosis not present

## 2016-07-10 DIAGNOSIS — F1020 Alcohol dependence, uncomplicated: Secondary | ICD-10-CM | POA: Diagnosis not present

## 2016-07-16 DIAGNOSIS — F1410 Cocaine abuse, uncomplicated: Secondary | ICD-10-CM | POA: Diagnosis not present

## 2016-07-16 DIAGNOSIS — F1020 Alcohol dependence, uncomplicated: Secondary | ICD-10-CM | POA: Diagnosis not present

## 2016-07-17 DIAGNOSIS — F1020 Alcohol dependence, uncomplicated: Secondary | ICD-10-CM | POA: Diagnosis not present

## 2016-07-18 ENCOUNTER — Ambulatory Visit: Payer: Medicare HMO | Attending: Family Medicine | Admitting: Family Medicine

## 2016-07-18 ENCOUNTER — Encounter: Payer: Self-pay | Admitting: Family Medicine

## 2016-07-18 VITALS — BP 90/68 | HR 99 | Temp 98.0°F | Ht 68.0 in | Wt 169.2 lb

## 2016-07-18 DIAGNOSIS — I110 Hypertensive heart disease with heart failure: Secondary | ICD-10-CM | POA: Diagnosis not present

## 2016-07-18 DIAGNOSIS — R21 Rash and other nonspecific skin eruption: Secondary | ICD-10-CM

## 2016-07-18 DIAGNOSIS — E785 Hyperlipidemia, unspecified: Secondary | ICD-10-CM | POA: Diagnosis not present

## 2016-07-18 DIAGNOSIS — E119 Type 2 diabetes mellitus without complications: Secondary | ICD-10-CM | POA: Diagnosis not present

## 2016-07-18 DIAGNOSIS — Z7982 Long term (current) use of aspirin: Secondary | ICD-10-CM | POA: Diagnosis not present

## 2016-07-18 DIAGNOSIS — E1165 Type 2 diabetes mellitus with hyperglycemia: Secondary | ICD-10-CM | POA: Diagnosis not present

## 2016-07-18 DIAGNOSIS — I509 Heart failure, unspecified: Secondary | ICD-10-CM | POA: Diagnosis not present

## 2016-07-18 DIAGNOSIS — K219 Gastro-esophageal reflux disease without esophagitis: Secondary | ICD-10-CM | POA: Insufficient documentation

## 2016-07-18 DIAGNOSIS — Z794 Long term (current) use of insulin: Secondary | ICD-10-CM | POA: Insufficient documentation

## 2016-07-18 LAB — GLUCOSE, POCT (MANUAL RESULT ENTRY): POC GLUCOSE: 130 mg/dL — AB (ref 70–99)

## 2016-07-18 NOTE — Progress Notes (Signed)
Subjective:  Patient ID: Melvin [name], male    DOB: [birth year]  Age: 56 y.o. MRN: [id number]  CC: Diabetes   HPI Melvin [name] is a 56 year old male with history of type 2 diabetes mellitus (A1c 12.5), hyperlipidemia, hypertensionWho presents today for follow-up on his diabetes.  His blood sugar log reveals fasting sugars in the 197 - 219 range and he has been taking 30 units of Lantus rather than 40 as prescribed but has been consistent with a NovoLog sliding scale. He denies hypoglycemia.  He noticed a dark rash and of his right thumb which is nonpainful and nonpruritic with no discharge and no fever.  Past Medical History:  Diagnosis Date  . Angina   . CHF (congestive heart failure) ([place])   . Diabetes mellitus   . GERD (gastroesophageal reflux disease)   . Hypertension   . Meningitis   . Pancreatitis   . Pneumonia     Past Surgical History:  Procedure Laterality Date  . ERCP  08/02/2011   Procedure: ENDOSCOPIC RETROGRADE CHOLANGIOPANCREATOGRAPHY (ERCP);  Surgeon: Beryle Beams, MD;  Location[facility] ENDOSCOPY;  Service: Endoscopy;  Laterality: N/A;  . ERCP  08/22/2011   Procedure: ENDOSCOPIC RETROGRADE CHOLANGIOPANCREATOGRAPHY (ERCP);  Surgeon: Beryle Beams, MD;  Location: [facility] ENDOSCOPY;  Service: Endoscopy;  Laterality: N/A;  . TONSILLECTOMY AND ADENOIDECTOMY     "as a kid"  . TYMPANOSTOMY TUBE PLACEMENT      No Known Allergies    Outpatient Medications Prior to Visit  Medication Sig Dispense Refill  . ACCU-CHEK SMARTVIEW test strip USE AS INSTRUCTED 100 each 5  . aspirin 81 MG EC tablet Take 1 tablet (81 mg total) by mouth daily. 30 tablet 3  . atorvastatin (LIPITOR) 10 MG tablet Take 1 tablet (10 mg total) by mouth daily at 6 PM. 30 tablet 3  . B-D INS SYRINGE 0.5CC/31GX5/16 31G X 5/16" 0.5 ML MISC USE TO INJECT INSULIN 3 TIMES A DAY AND AT BEDTIME 100 each 5  . Blood Glucose Monitoring Suppl (TRUE METRIX METER) W/DEVICE KIT 1 each by Does not apply route  4 (four) times daily -  before meals and at bedtime. 1 kit 0  . gabapentin (NEURONTIN) 300 MG capsule TAKE 2 CAPSULES (600 MG TOTAL) BY MOUTH 2 (TWO) TIMES DAILY. 120 capsule 3  . insulin aspart (NOVOLOG) 100 UNIT/ML injection 150-200, give 3 units 201-250 give 5 units 251-300 give 7 units 301-350 give 9 units 351-400 give 12 units 3 vial 5  . insulin glargine (LANTUS) 100 UNIT/ML injection Inject 0.4 mLs (40 Units total) into the skin daily. 30 mL 3  . lipase/protease/amylase (CREON) 12000 units CPEP capsule Take 1 capsule (12,000 Units total) by mouth 3 (three) times daily before meals. 270 capsule 1  . TRUEPLUS LANCETS 28G MISC 1 each by Does not apply route 4 (four) times daily -  before meals and at bedtime. 100 each 5  . clotrimazole (LOTRIMIN) 1 % cream Apply 1 application topically 2 (two) times daily. (Patient not taking: Reported on 07/18/2016) 30 g 0  . valACYclovir (VALTREX) 500 MG tablet Take 1 tablet (500 mg total) by mouth 2 (two) times daily. (Patient not taking: Reported on 07/18/2016) 14 tablet 0   No facility-administered medications prior to visit.     ROS Review of Systems  Constitutional: Negative for activity change and appetite change.  HENT: Negative for sinus pressure and sore throat.   Respiratory: Negative for chest tightness, shortness of breath and wheezing.  Cardiovascular: Negative for chest pain and palpitations.  Gastrointestinal: Negative for abdominal distention, abdominal pain and constipation.  Genitourinary: Negative.   Musculoskeletal: Negative.   Skin: Positive for rash.  Psychiatric/Behavioral: Negative for behavioral problems and dysphoric mood.    Objective:  BP 90/68 (BP Location: Right Arm, Patient Position: Sitting, Cuff Size: Large)   Pulse 99   Temp 98 F (36.7 C) (Oral)   Ht 5' 8"  (1.727 m)   Wt 169 lb 3.2 oz (76.7 kg)   SpO2 100%   BMI 25.73 kg/m   BP/Weight 07/18/2016 06/24/2016 0/26/3785  Systolic BP 90 885 027  Diastolic BP 68  79 72  Wt. (Lbs) 169.2 175.8 169.7  BMI 25.73 26.73 25.8      Physical Exam  Constitutional: He is oriented to person, place, and time. He appears well-developed and well-nourished.  Cardiovascular: Normal rate, normal heart sounds and intact distal pulses.   No murmur heard. Pulmonary/Chest: Effort normal and breath sounds normal. He has no wheezes. He has no rales. He exhibits no tenderness.  Abdominal: Soft. Bowel sounds are normal. He exhibits no distension and no mass. There is no tenderness.  Musculoskeletal: Normal range of motion.  Neurological: He is alert and oriented to person, place, and time.  Skin:  Blackish hardened papule on the tip of right thumb which is not tender    Lab Results  Component Value Date   HGBA1C 12.5 06/13/2016    Assessment & Plan:   1. Diabetes mellitus without complication ([place]) Uncontrolled with A1c of 12.5 Blood sugar log reveals improvement Advised to continue 40 units of Lantus Continue NovoLog sliding scale - Glucose (CBG)  2. Rash Asymptomatic Will monitor and if worsening refer to Dermatology   No orders of the defined types were placed in this encounter.   Follow-up: Return in about 3 weeks (around 08/08/2016) for follow up on Diabetes mellitus.   Arnoldo Morale MD

## 2016-07-21 DIAGNOSIS — F1020 Alcohol dependence, uncomplicated: Secondary | ICD-10-CM | POA: Diagnosis not present

## 2016-07-23 DIAGNOSIS — F1020 Alcohol dependence, uncomplicated: Secondary | ICD-10-CM | POA: Diagnosis not present

## 2016-07-23 DIAGNOSIS — F1410 Cocaine abuse, uncomplicated: Secondary | ICD-10-CM | POA: Diagnosis not present

## 2016-07-28 DIAGNOSIS — F1020 Alcohol dependence, uncomplicated: Secondary | ICD-10-CM | POA: Diagnosis not present

## 2016-07-30 DIAGNOSIS — F1410 Cocaine abuse, uncomplicated: Secondary | ICD-10-CM | POA: Diagnosis not present

## 2016-07-30 DIAGNOSIS — F1020 Alcohol dependence, uncomplicated: Secondary | ICD-10-CM | POA: Diagnosis not present

## 2016-07-31 DIAGNOSIS — F1410 Cocaine abuse, uncomplicated: Secondary | ICD-10-CM | POA: Diagnosis not present

## 2016-07-31 DIAGNOSIS — F1020 Alcohol dependence, uncomplicated: Secondary | ICD-10-CM | POA: Diagnosis not present

## 2016-08-04 DIAGNOSIS — F1020 Alcohol dependence, uncomplicated: Secondary | ICD-10-CM | POA: Diagnosis not present

## 2016-08-04 DIAGNOSIS — F1410 Cocaine abuse, uncomplicated: Secondary | ICD-10-CM | POA: Diagnosis not present

## 2016-08-06 DIAGNOSIS — F1020 Alcohol dependence, uncomplicated: Secondary | ICD-10-CM | POA: Diagnosis not present

## 2016-08-06 DIAGNOSIS — F1410 Cocaine abuse, uncomplicated: Secondary | ICD-10-CM | POA: Diagnosis not present

## 2016-08-07 DIAGNOSIS — F1020 Alcohol dependence, uncomplicated: Secondary | ICD-10-CM | POA: Diagnosis not present

## 2016-08-07 DIAGNOSIS — F1410 Cocaine abuse, uncomplicated: Secondary | ICD-10-CM | POA: Diagnosis not present

## 2016-08-08 ENCOUNTER — Encounter: Payer: Self-pay | Admitting: Family Medicine

## 2016-08-08 ENCOUNTER — Other Ambulatory Visit: Payer: Self-pay | Admitting: Internal Medicine

## 2016-08-08 ENCOUNTER — Ambulatory Visit: Payer: Medicare HMO | Attending: Family Medicine | Admitting: Family Medicine

## 2016-08-08 VITALS — BP 108/71 | HR 89 | Temp 97.7°F | Resp 18 | Ht 68.0 in | Wt 173.0 lb

## 2016-08-08 DIAGNOSIS — K219 Gastro-esophageal reflux disease without esophagitis: Secondary | ICD-10-CM | POA: Insufficient documentation

## 2016-08-08 DIAGNOSIS — Z794 Long term (current) use of insulin: Secondary | ICD-10-CM | POA: Diagnosis not present

## 2016-08-08 DIAGNOSIS — E1149 Type 2 diabetes mellitus with other diabetic neurological complication: Secondary | ICD-10-CM

## 2016-08-08 DIAGNOSIS — E1165 Type 2 diabetes mellitus with hyperglycemia: Secondary | ICD-10-CM | POA: Insufficient documentation

## 2016-08-08 DIAGNOSIS — E0800 Diabetes mellitus due to underlying condition with hyperosmolarity without nonketotic hyperglycemic-hyperosmolar coma (NKHHC): Secondary | ICD-10-CM

## 2016-08-08 DIAGNOSIS — E785 Hyperlipidemia, unspecified: Secondary | ICD-10-CM

## 2016-08-08 DIAGNOSIS — Z7982 Long term (current) use of aspirin: Secondary | ICD-10-CM | POA: Diagnosis not present

## 2016-08-08 DIAGNOSIS — I509 Heart failure, unspecified: Secondary | ICD-10-CM | POA: Insufficient documentation

## 2016-08-08 DIAGNOSIS — I110 Hypertensive heart disease with heart failure: Secondary | ICD-10-CM | POA: Insufficient documentation

## 2016-08-08 DIAGNOSIS — M79672 Pain in left foot: Secondary | ICD-10-CM | POA: Insufficient documentation

## 2016-08-08 DIAGNOSIS — M79605 Pain in left leg: Secondary | ICD-10-CM

## 2016-08-08 LAB — GLUCOSE, POCT (MANUAL RESULT ENTRY): POC GLUCOSE: 81 mg/dL (ref 70–99)

## 2016-08-08 MED ORDER — NAPROXEN 500 MG PO TABS: 500.0000 mg | ORAL_TABLET | Freq: Two times a day (BID) | ORAL | 1 refills | Status: DC

## 2016-08-08 MED ORDER — INSULIN GLARGINE 100 UNIT/ML ~~LOC~~ SOLN: 38.0000 [IU] | Freq: Every day | SUBCUTANEOUS | 3 refills | Status: DC

## 2016-08-08 NOTE — Progress Notes (Signed)
Patient is here for FU DM  Patient complains of intermittent swelling in the left leg. Patient denies pain at this time.  Patient has taken medication today Patient has eaten today

## 2016-08-08 NOTE — Progress Notes (Signed)
Subjective:  Patient ID: Melvin [name], male    DOB: 09/02/1960  Age: 56 y.o. MRN: [id number]  CC: Diabetes   HPI Melvin [name] is a 56 year old male with history of type 2 diabetes mellitus (A1c 12.5), hyperlipidemia, hypertension who presents today for follow-up on his diabetes.  He reports his fasting blood sugars have been in the 120-140 range and he has had some occasions with sugars of 60 and 65 but denies tremors, sweating or hypoglycemic symptoms.  He is concerned about a one-week history of pain and some swelling of his left foot; pain occurs in the posterior aspect of his ankle and lower leg and denies history of trauma. He relates this as moderate pain and is able to ambulate without any difficulties.  Past Medical History:  Diagnosis Date  . Angina   . CHF (congestive heart failure) ([place])   . Diabetes mellitus   . GERD (gastroesophageal reflux disease)   . Hypertension   . Meningitis   . Pancreatitis   . Pneumonia     Past Surgical History:  Procedure Laterality Date  . ERCP  08/02/2011   Procedure: ENDOSCOPIC RETROGRADE CHOLANGIOPANCREATOGRAPHY (ERCP);  Surgeon: Beryle Beams, MD;  Location: [facility] ENDOSCOPY;  Service: Endoscopy;  Laterality: N/A;  . ERCP  08/22/2011   Procedure: ENDOSCOPIC RETROGRADE CHOLANGIOPANCREATOGRAPHY (ERCP);  Surgeon: Beryle Beams, MD;  Location: [facility] ENDOSCOPY;  Service: Endoscopy;  Laterality: N/A;  . TONSILLECTOMY AND ADENOIDECTOMY     "as a kid"  . TYMPANOSTOMY TUBE PLACEMENT      No Known Allergies     Outpatient Medications Prior to Visit  Medication Sig Dispense Refill  . ACCU-CHEK SMARTVIEW test strip USE AS INSTRUCTED 100 each 5  . aspirin 81 MG EC tablet Take 1 tablet (81 mg total) by mouth daily. 30 tablet 3  . B-D INS SYRINGE 0.5CC/31GX5/16 31G X 5/16" 0.5 ML MISC USE TO INJECT INSULIN 3 TIMES A DAY AND AT BEDTIME 100 each 5  . Blood Glucose Monitoring Suppl (TRUE METRIX METER) W/DEVICE KIT 1 each by Does not  apply route 4 (four) times daily -  before meals and at bedtime. 1 kit 0  . clotrimazole (LOTRIMIN) 1 % cream Apply 1 application topically 2 (two) times daily. (Patient not taking: Reported on 07/18/2016) 30 g 0  . gabapentin (NEURONTIN) 300 MG capsule TAKE 2 CAPSULES (600 MG TOTAL) BY MOUTH 2 (TWO) TIMES DAILY. 120 capsule 3  . insulin aspart (NOVOLOG) 100 UNIT/ML injection 150-200, give 3 units 201-250 give 5 units 251-300 give 7 units 301-350 give 9 units 351-400 give 12 units 3 vial 5  . lipase/protease/amylase (CREON) 12000 units CPEP capsule Take 1 capsule (12,000 Units total) by mouth 3 (three) times daily before meals. 270 capsule 1  . TRUEPLUS LANCETS 28G MISC 1 each by Does not apply route 4 (four) times daily -  before meals and at bedtime. 100 each 5  . atorvastatin (LIPITOR) 10 MG tablet Take 1 tablet (10 mg total) by mouth daily at 6 PM. 30 tablet 3  . insulin glargine (LANTUS) 100 UNIT/ML injection Inject 0.4 mLs (40 Units total) into the skin daily. 30 mL 3  . valACYclovir (VALTREX) 500 MG tablet Take 1 tablet (500 mg total) by mouth 2 (two) times daily. (Patient not taking: Reported on 07/18/2016) 14 tablet 0   No facility-administered medications prior to visit.     ROS Review of Systems  Constitutional: Negative for activity change and appetite change.  HENT: Negative for sinus pressure and sore throat.   Respiratory: Negative for chest tightness, shortness of breath and wheezing.   Cardiovascular: Negative for chest pain and palpitations.  Gastrointestinal: Negative for abdominal distention, abdominal pain and constipation.  Genitourinary: Negative.   Musculoskeletal:       See hpi  Psychiatric/Behavioral: Negative for behavioral problems and dysphoric mood.    Objective:  BP 108/71 (BP Location: Right Arm, Patient Position: Sitting, Cuff Size: Normal)   Pulse 89   Temp 97.7 F (36.5 C) (Oral)   Resp 18   Ht 5' 8"  (1.727 m)   Wt 173 lb (78.5 kg)   SpO2 99%    BMI 26.30 kg/m   BP/Weight 08/08/2016 07/18/2016 8/45/3646  Systolic BP 803 90 212  Diastolic BP 71 68 79  Wt. (Lbs) 173 169.2 175.8  BMI 26.3 25.73 26.73      Physical Exam  Constitutional: He is oriented to person, place, and time. He appears well-developed and well-nourished.  Cardiovascular: Normal rate, normal heart sounds and intact distal pulses.   No murmur heard. Pulmonary/Chest: Effort normal and breath sounds normal. He has no wheezes. He has no rales. He exhibits no tenderness.  Abdominal: Soft. Bowel sounds are normal. He exhibits no distension and no mass. There is no tenderness.  Musculoskeletal: Normal range of motion.  Left foot is slightly larger than right but no obvious edema on palpation and no tenderness.  Neurological: He is alert and oriented to person, place, and time.     Lab Results  Component Value Date   HGBA1C 12.5 06/13/2016    Assessment & Plan:   1. Diabetes mellitus due to underlying condition with hyperosmolarity without coma, with long-term current use of insulin (HCC) A1c of 12.5 Blood sugars revealed improvement We'll cut back Lantus by 2 units to 38 units due to occasional hypoglycemia. Educated on warning signs of hypoglycemia and he knows to reduce Lantus by 2 units - Glucose (CBG) - Microalbumin/Creatinine Ratio, Urine  2. Pain of left lower extremity We'll treat for possible osteoarthritis Cannot exclude underlying diabetic neuropathy - naproxen (NAPROSYN) 500 MG tablet; Take 1 tablet (500 mg total) by mouth 2 (two) times daily with a meal.  Dispense: 60 tablet; Refill: 1  3. Type 2 diabetes mellitus with other neurologic complication, with long-term current use of insulin (HCC) Current on gabapentin. - insulin glargine (LANTUS) 100 UNIT/ML injection; Inject 0.38 mLs (38 Units total) into the skin daily.  Dispense: 30 mL; Refill: 3   Meds ordered this encounter  Medications  . insulin glargine (LANTUS) 100 UNIT/ML injection     Sig: Inject 0.38 mLs (38 Units total) into the skin daily.    Dispense:  30 mL    Refill:  3    Discontinue previous dose  . naproxen (NAPROSYN) 500 MG tablet    Sig: Take 1 tablet (500 mg total) by mouth 2 (two) times daily with a meal.    Dispense:  60 tablet    Refill:  1    Follow-up: Return in about 3 months (around 11/08/2016) for follow up on Diabetes mellitus.   Arnoldo Morale MD

## 2016-08-09 LAB — MICROALBUMIN / CREATININE URINE RATIO
CREATININE, URINE: 120 mg/dL (ref 20–370)
MICROALB UR: 10.7 mg/dL
Microalb Creat Ratio: 89 mcg/mg creat — ABNORMAL HIGH (ref <30)

## 2016-08-13 DIAGNOSIS — F1410 Cocaine abuse, uncomplicated: Secondary | ICD-10-CM | POA: Diagnosis not present

## 2016-08-13 DIAGNOSIS — F1020 Alcohol dependence, uncomplicated: Secondary | ICD-10-CM | POA: Diagnosis not present

## 2016-08-14 DIAGNOSIS — F1410 Cocaine abuse, uncomplicated: Secondary | ICD-10-CM | POA: Diagnosis not present

## 2016-08-14 DIAGNOSIS — F1020 Alcohol dependence, uncomplicated: Secondary | ICD-10-CM | POA: Diagnosis not present

## 2016-08-20 ENCOUNTER — Ambulatory Visit (INDEPENDENT_AMBULATORY_CARE_PROVIDER_SITE_OTHER): Payer: Medicare HMO | Admitting: Podiatry

## 2016-08-20 ENCOUNTER — Ambulatory Visit (INDEPENDENT_AMBULATORY_CARE_PROVIDER_SITE_OTHER): Payer: Medicare HMO

## 2016-08-20 DIAGNOSIS — L608 Other nail disorders: Secondary | ICD-10-CM | POA: Diagnosis not present

## 2016-08-20 DIAGNOSIS — B351 Tinea unguium: Secondary | ICD-10-CM | POA: Diagnosis not present

## 2016-08-20 DIAGNOSIS — R600 Localized edema: Secondary | ICD-10-CM

## 2016-08-20 DIAGNOSIS — M7742 Metatarsalgia, left foot: Secondary | ICD-10-CM | POA: Diagnosis not present

## 2016-08-20 DIAGNOSIS — M79609 Pain in unspecified limb: Secondary | ICD-10-CM | POA: Diagnosis not present

## 2016-08-20 DIAGNOSIS — L603 Nail dystrophy: Secondary | ICD-10-CM | POA: Diagnosis not present

## 2016-08-21 DIAGNOSIS — F1020 Alcohol dependence, uncomplicated: Secondary | ICD-10-CM | POA: Diagnosis not present

## 2016-08-21 DIAGNOSIS — F1410 Cocaine abuse, uncomplicated: Secondary | ICD-10-CM | POA: Diagnosis not present

## 2016-08-28 DIAGNOSIS — F1020 Alcohol dependence, uncomplicated: Secondary | ICD-10-CM | POA: Diagnosis not present

## 2016-08-28 DIAGNOSIS — F1410 Cocaine abuse, uncomplicated: Secondary | ICD-10-CM | POA: Diagnosis not present

## 2016-08-29 MED ORDER — BETAMETHASONE SOD PHOS & ACET 6 (3-3) MG/ML IJ SUSP: 3.0000 mg | Freq: Once | INTRAMUSCULAR | Status: DC

## 2016-08-29 NOTE — Progress Notes (Signed)
   SUBJECTIVE Patient with a history of diabetes mellitus presents to office today complaining of elongated, thickened nails. Pain while ambulating in shoes. Patient is unable to trim their own nails.  Patient also presents today with a new complaint of swelling to the left foot times 2-3 weeks. Patient states that his left foot has become very painful. Patient denies trauma. No alleviating factors.  OBJECTIVE General Patient is awake, alert, and oriented x 3 and in no acute distress. Derm Skin is dry and supple bilateral. Negative open lesions or macerations. Remaining integument unremarkable. Nails are tender, long, thickened and dystrophic with subungual debris, consistent with onychomycosis, 1-5 bilateral. No signs of infection noted. Vasc  DP and PT pedal pulses palpable bilaterally. Temperature gradient within normal limits.  Neuro Epicritic and protective threshold sensation diminished bilaterally.  Musculoskeletal Exam No symptomatic pedal deformities noted bilateral. Muscular strength within normal limits. Radiological Exam negative for fracture. Normal joint spaces throughout the foot.  ASSESSMENT 1. Diabetes Mellitus w/ peripheral neuropathy 2. Onychomycosis of nail due to dermatophyte bilateral 3. Pain in foot bilateral 4. Left foot pain with edema 5. Soft tissue injury left foot  PLAN OF CARE 1. Patient evaluated today. 2. Instructed to maintain good pedal hygiene and foot care. Stressed importance of controlling blood sugar.  3. Mechanical debridement of nails 1-5 bilaterally performed using a nail nipper. Filed with dremel without incident.  4. Injection of 0.5 mL Celestone Soluspan injected into the left dorsum foot overlying metatarsals 3-5  5. Return to clinic in 3 mos.     Edrick Kins, DPM [facility]  Dr. Edrick Kins, [address]                                        [place], [place] [address]                Office [phone]    Fax [phone]

## 2016-09-02 DIAGNOSIS — F1020 Alcohol dependence, uncomplicated: Secondary | ICD-10-CM | POA: Diagnosis not present

## 2016-09-11 DIAGNOSIS — F1020 Alcohol dependence, uncomplicated: Secondary | ICD-10-CM | POA: Diagnosis not present

## 2016-09-22 ENCOUNTER — Telehealth: Payer: Self-pay | Admitting: Family Medicine

## 2016-09-22 NOTE — Telephone Encounter (Signed)
Patient called the office asking to speak with PCP to order a new meter patient expressed that the one he currently has doesn't work anymore. Please follow up.  Thank you.

## 2016-09-23 MED ORDER — ACCU-CHEK SOFTCLIX LANCETS MISC: 12 refills | Status: AC

## 2016-09-23 MED ORDER — ACCU-CHEK SOFTCLIX LANCET DEV MISC: 0 refills | Status: AC

## 2016-09-23 MED ORDER — GLUCOSE BLOOD VI STRP: ORAL_STRIP | 12 refills | Status: DC

## 2016-09-23 MED ORDER — ACCU-CHEK AVIVA PLUS W/DEVICE KIT: PACK | 0 refills | Status: DC

## 2016-09-23 NOTE — Telephone Encounter (Signed)
Patient with [facility] insurance- sent in [place] supplies to preferred pharmacy.

## 2016-09-30 ENCOUNTER — Telehealth: Payer: Self-pay | Admitting: Family Medicine

## 2016-09-30 NOTE — Telephone Encounter (Signed)
Pt. Called stating that [facility] had faxed a form to the office to be filled out by PCP so that he can get meals delivered to his home. Please f/u with pt.

## 2016-10-01 NOTE — Telephone Encounter (Signed)
Noted  

## 2016-10-01 NOTE — Telephone Encounter (Signed)
[employer] care [employer] nurse called about the fax sent to the PCP still waiting for the fax to be return and signed by the pcp, she will refax it today, please follow up

## 2016-10-06 ENCOUNTER — Encounter: Payer: Self-pay | Admitting: Podiatry

## 2016-10-06 ENCOUNTER — Other Ambulatory Visit: Payer: Self-pay | Admitting: Family Medicine

## 2016-10-06 ENCOUNTER — Ambulatory Visit (INDEPENDENT_AMBULATORY_CARE_PROVIDER_SITE_OTHER): Payer: Medicare HMO | Admitting: Podiatry

## 2016-10-06 DIAGNOSIS — M25572 Pain in left ankle and joints of left foot: Secondary | ICD-10-CM

## 2016-10-06 DIAGNOSIS — M79605 Pain in left leg: Secondary | ICD-10-CM

## 2016-10-06 DIAGNOSIS — M659 Synovitis and tenosynovitis, unspecified: Secondary | ICD-10-CM | POA: Diagnosis not present

## 2016-10-06 DIAGNOSIS — M7752 Other enthesopathy of left foot: Secondary | ICD-10-CM | POA: Diagnosis not present

## 2016-10-06 MED ORDER — BETAMETHASONE SOD PHOS & ACET 6 (3-3) MG/ML IJ SUSP: 3.0000 mg | Freq: Once | INTRAMUSCULAR | Status: DC

## 2016-10-06 NOTE — Progress Notes (Signed)
   Subjective:  Patient presents today with a new complaint of left ankle pain. Patient states that he's been doing an excessive amount of walking and has been experiencing significant pain to his left ankle. Patient denies trauma. Patient presents today for follow-up treatment and evaluation  Objective / Physical Exam:  General:  The patient is alert and oriented x3 in no acute distress. Dermatology:  Skin is warm, dry and supple bilateral lower extremities. Negative for open lesions or macerations. Vascular:  Palpable pedal pulses bilaterally. No edema or erythema noted. Capillary refill within normal limits. Neurological:  Epicritic and protective threshold grossly intact bilaterally.  Musculoskeletal Exam:  Significant pain on palpation noted to the anterior medial and lateral aspects of the patient's left ankle joint. Mild edema noted.  Range of motion within normal limits to all pedal and ankle joints bilateral. Muscle strength 5/5 in all groups bilateral.   Assessment: #1 pain in left ankle with capsulitis and synovitis  Plan of Care:  #1 Patient was evaluated. #2 injection of 0.5 mL Celestone Soluspan injected in the patient's left ankle. #3 ankle brace dispensed #4 patient is to return to clinic in 4 weeks   Edrick Kins, DPM [facility]  Dr. Edrick Kins, [address]                                        [place], [place] [address]                Office [phone]  Fax [phone]

## 2016-10-09 ENCOUNTER — Other Ambulatory Visit: Payer: Self-pay | Admitting: Pharmacist

## 2016-10-09 MED ORDER — ACCU-CHEK NANO SMARTVIEW W/DEVICE KIT: PACK | 0 refills | Status: AC

## 2016-10-09 MED ORDER — ACCU-CHEK FASTCLIX LANCETS MISC: 12 refills | Status: DC

## 2016-10-09 MED ORDER — GLUCOSE BLOOD VI STRP: ORAL_STRIP | 12 refills | Status: DC

## 2016-10-16 ENCOUNTER — Telehealth: Payer: Self-pay

## 2016-10-16 NOTE — Telephone Encounter (Signed)
Also faxed completed [employer] form to [employer] [facility].

## 2016-10-16 NOTE — Telephone Encounter (Signed)
Faxed New RX Fax Form to [facility]/[phone] for the following:  Bd Single use swab Accu-chek smartview control sol Bd insulin syringe U-500 1/2 ml 31 gauge x 15/64"

## 2016-10-27 ENCOUNTER — Other Ambulatory Visit: Payer: Self-pay | Admitting: Pharmacist

## 2016-10-27 MED ORDER — "INSULIN SYRINGE-NEEDLE U-100 31G X 5/16"" 0.5 ML MISC": 3 refills | Status: AC

## 2016-11-11 ENCOUNTER — Ambulatory Visit: Payer: Medicare HMO | Attending: Family Medicine | Admitting: Family Medicine

## 2016-11-11 ENCOUNTER — Encounter: Payer: Self-pay | Admitting: Family Medicine

## 2016-11-11 VITALS — BP 121/80 | HR 96 | Temp 97.5°F | Resp 18 | Ht 68.0 in | Wt 168.0 lb

## 2016-11-11 DIAGNOSIS — E1140 Type 2 diabetes mellitus with diabetic neuropathy, unspecified: Secondary | ICD-10-CM | POA: Diagnosis not present

## 2016-11-11 DIAGNOSIS — I509 Heart failure, unspecified: Secondary | ICD-10-CM | POA: Diagnosis not present

## 2016-11-11 DIAGNOSIS — T300 Burn of unspecified body region, unspecified degree: Secondary | ICD-10-CM | POA: Diagnosis not present

## 2016-11-11 DIAGNOSIS — Z1211 Encounter for screening for malignant neoplasm of colon: Secondary | ICD-10-CM | POA: Diagnosis not present

## 2016-11-11 DIAGNOSIS — K861 Other chronic pancreatitis: Secondary | ICD-10-CM | POA: Diagnosis not present

## 2016-11-11 DIAGNOSIS — E1149 Type 2 diabetes mellitus with other diabetic neurological complication: Secondary | ICD-10-CM

## 2016-11-11 DIAGNOSIS — Z794 Long term (current) use of insulin: Secondary | ICD-10-CM | POA: Insufficient documentation

## 2016-11-11 DIAGNOSIS — E7800 Pure hypercholesterolemia, unspecified: Secondary | ICD-10-CM | POA: Insufficient documentation

## 2016-11-11 DIAGNOSIS — K860 Alcohol-induced chronic pancreatitis: Secondary | ICD-10-CM | POA: Diagnosis not present

## 2016-11-11 DIAGNOSIS — I110 Hypertensive heart disease with heart failure: Secondary | ICD-10-CM | POA: Insufficient documentation

## 2016-11-11 DIAGNOSIS — E119 Type 2 diabetes mellitus without complications: Secondary | ICD-10-CM | POA: Diagnosis present

## 2016-11-11 DIAGNOSIS — Z7982 Long term (current) use of aspirin: Secondary | ICD-10-CM | POA: Diagnosis not present

## 2016-11-11 DIAGNOSIS — K219 Gastro-esophageal reflux disease without esophagitis: Secondary | ICD-10-CM | POA: Insufficient documentation

## 2016-11-11 DIAGNOSIS — T23022A Burn of unspecified degree of single left finger (nail) except thumb, initial encounter: Secondary | ICD-10-CM | POA: Insufficient documentation

## 2016-11-11 DIAGNOSIS — X088XXA Exposure to other specified smoke, fire and flames, initial encounter: Secondary | ICD-10-CM | POA: Insufficient documentation

## 2016-11-11 DIAGNOSIS — Z79899 Other long term (current) drug therapy: Secondary | ICD-10-CM | POA: Insufficient documentation

## 2016-11-11 DIAGNOSIS — Z1159 Encounter for screening for other viral diseases: Secondary | ICD-10-CM

## 2016-11-11 LAB — POCT GLYCOSYLATED HEMOGLOBIN (HGB A1C): HEMOGLOBIN A1C: 8.4

## 2016-11-11 LAB — GLUCOSE, POCT (MANUAL RESULT ENTRY): POC Glucose: 193 mg/dl — AB (ref 70–99)

## 2016-11-11 MED ORDER — INSULIN GLARGINE 100 UNIT/ML ~~LOC~~ SOLN: 38.0000 [IU] | Freq: Every day | SUBCUTANEOUS | 3 refills | Status: DC

## 2016-11-11 MED ORDER — SILVER SULFADIAZINE 1 % EX CREA: 1.0000 "application " | TOPICAL_CREAM | Freq: Every day | CUTANEOUS | 1 refills | Status: DC

## 2016-11-11 MED ORDER — ATORVASTATIN CALCIUM 10 MG PO TABS: 10.0000 mg | ORAL_TABLET | Freq: Every day | ORAL | 3 refills | Status: DC

## 2016-11-11 MED ORDER — GABAPENTIN 300 MG PO CAPS: 600.0000 mg | ORAL_CAPSULE | Freq: Two times a day (BID) | ORAL | 3 refills | Status: DC
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
## 2016-11-14 ENCOUNTER — Ambulatory Visit: Payer: Medicare HMO | Attending: Family Medicine

## 2016-11-14 DIAGNOSIS — Z794 Long term (current) use of insulin: Secondary | ICD-10-CM | POA: Diagnosis not present

## 2016-11-14 DIAGNOSIS — Z1159 Encounter for screening for other viral diseases: Secondary | ICD-10-CM | POA: Diagnosis not present

## 2016-11-14 DIAGNOSIS — E7800 Pure hypercholesterolemia, unspecified: Secondary | ICD-10-CM | POA: Diagnosis not present

## 2016-11-14 DIAGNOSIS — E1149 Type 2 diabetes mellitus with other diabetic neurological complication: Secondary | ICD-10-CM | POA: Diagnosis not present

## 2016-11-14 NOTE — Progress Notes (Signed)
Patient here for lab visit only 

## 2016-11-15 LAB — CMP14+EGFR
A/G RATIO: 0.9 — AB (ref 1.2–2.2)
ALBUMIN: 3.3 g/dL — AB (ref 3.5–5.5)
ALT: 89 IU/L — ABNORMAL HIGH (ref 0–44)
AST: 153 IU/L — ABNORMAL HIGH (ref 0–40)
Alkaline Phosphatase: 144 IU/L — ABNORMAL HIGH (ref 39–117)
BILIRUBIN TOTAL: 1.3 mg/dL — AB (ref 0.0–1.2)
BUN / CREAT RATIO: 8 — AB (ref 9–20)
BUN: 9 mg/dL (ref 6–24)
CALCIUM: 8.6 mg/dL — AB (ref 8.7–10.2)
CHLORIDE: 104 mmol/L (ref 96–106)
CO2: 20 mmol/L (ref 18–29)
Creatinine, Ser: 1.19 mg/dL (ref 0.76–1.27)
GFR calc Af Amer: 78 mL/min/{1.73_m2} (ref >59)
GFR, EST NON AFRICAN AMERICAN: 67 mL/min/{1.73_m2} (ref >59)
GLUCOSE: 215 mg/dL — AB (ref 65–99)
Globulin, Total: 3.6 g/dL (ref 1.5–4.5)
Potassium: 3.9 mmol/L (ref 3.5–5.2)
Sodium: 137 mmol/L (ref 134–144)
TOTAL PROTEIN: 6.9 g/dL (ref 6.0–8.5)

## 2016-11-15 LAB — HCV COMMENT:

## 2016-11-15 LAB — LIPID PANEL
CHOL/HDL RATIO: 1.3 ratio (ref 0.0–5.0)
Cholesterol, Total: 138 mg/dL (ref 100–199)
HDL: 104 mg/dL (ref >39)
LDL Calculated: 22 mg/dL (ref 0–99)
Triglycerides: 58 mg/dL (ref 0–149)
VLDL CHOLESTEROL CAL: 12 mg/dL (ref 5–40)

## 2016-11-15 LAB — HEPATITIS C ANTIBODY (REFLEX): HCV Ab: <0.1 s/co ratio (ref 0.0–0.9)

## 2016-11-17 ENCOUNTER — Other Ambulatory Visit: Payer: Self-pay | Admitting: Family Medicine

## 2016-11-17 DIAGNOSIS — R748 Abnormal levels of other serum enzymes: Secondary | ICD-10-CM

## 2016-11-17 NOTE — Progress Notes (Signed)
Could you please schedule this right upper quadrant ultrasound for the patient? Thank you

## 2016-11-18 NOTE — Progress Notes (Addendum)
Appointment scheduled: Thursday, June 21 at [place]. Pt aware.

## 2016-11-21 ENCOUNTER — Ambulatory Visit (HOSPITAL_COMMUNITY): Payer: Medicare HMO

## 2016-11-24 ENCOUNTER — Other Ambulatory Visit: Payer: Self-pay | Admitting: Pharmacist

## 2016-11-24 ENCOUNTER — Ambulatory Visit: Payer: Medicare HMO | Admitting: Podiatry

## 2016-11-24 DIAGNOSIS — E1149 Type 2 diabetes mellitus with other diabetic neurological complication: Secondary | ICD-10-CM

## 2016-11-24 DIAGNOSIS — Z794 Long term (current) use of insulin: Principal | ICD-10-CM

## 2016-11-24 MED ORDER — INSULIN ASPART 100 UNIT/ML ~~LOC~~ SOLN: SUBCUTANEOUS | 0 refills | Status: DC

## 2016-11-26 ENCOUNTER — Ambulatory Visit (INDEPENDENT_AMBULATORY_CARE_PROVIDER_SITE_OTHER): Payer: Medicare HMO | Admitting: Podiatry

## 2016-11-26 ENCOUNTER — Encounter: Payer: Self-pay | Admitting: Podiatry

## 2016-11-26 DIAGNOSIS — B351 Tinea unguium: Secondary | ICD-10-CM | POA: Diagnosis not present

## 2016-11-26 DIAGNOSIS — L97322 Non-pressure chronic ulcer of left ankle with fat layer exposed: Secondary | ICD-10-CM | POA: Diagnosis not present

## 2016-11-26 DIAGNOSIS — M79676 Pain in unspecified toe(s): Secondary | ICD-10-CM | POA: Diagnosis not present

## 2016-11-26 DIAGNOSIS — I70245 Atherosclerosis of native arteries of left leg with ulceration of other part of foot: Secondary | ICD-10-CM

## 2016-11-26 DIAGNOSIS — E0842 Diabetes mellitus due to underlying condition with diabetic polyneuropathy: Secondary | ICD-10-CM

## 2016-11-27 ENCOUNTER — Telehealth: Payer: Self-pay | Admitting: Family Medicine

## 2016-11-27 ENCOUNTER — Ambulatory Visit (HOSPITAL_COMMUNITY)
Admission: RE | Admit: 2016-11-27 | Discharge: 2016-11-27 | Disposition: A | Discharge status: Home / Self Care | Payer: Medicare HMO | Source: Ambulatory Visit | Attending: Family Medicine | Admitting: Family Medicine

## 2016-11-27 DIAGNOSIS — R748 Abnormal levels of other serum enzymes: Secondary | ICD-10-CM | POA: Diagnosis not present

## 2016-11-27 DIAGNOSIS — R7989 Other specified abnormal findings of blood chemistry: Secondary | ICD-10-CM | POA: Diagnosis not present

## 2016-11-27 NOTE — Telephone Encounter (Signed)
What would the indication for referral to dermatology be? Thanks.

## 2016-11-27 NOTE — Telephone Encounter (Signed)
Pt calling to request referral to dermatology. States that podiatrist advised pt to contact PCP to request referral.

## 2016-11-30 NOTE — Progress Notes (Signed)
   Subjective:  Patient is a 56 year old male with past medical history of type 2 diabetes presenting today for follow-up evaluation of left ankle pain. He states the pain has improved. Wearing the ankle brace has helped alleviate the pain. He also has a new complaint of an ulcer to the left lateral ankle that has been present for the past week. He denies any trauma or pain.  Objective / Physical Exam:  General:  The patient is alert and oriented x3 in no acute distress. Dermatology:  Skin is warm, dry and supple bilateral lower extremities. Nails are tender, long, thickened and dystrophic with subungual debris, consistent with onychomycosis, 1-5 bilateral. No signs of infection noted.  Wound #1 noted to the left lateral ankle measuring 1.0 x 0.5 x 0.2 cm (LxWxD).   To the noted ulceration(s), there is no eschar. There is a moderate amount of slough, fibrin, and necrotic tissue noted. Granulation tissue and wound base is red. There is a minimal amount of serosanguineous drainage noted. There is no exposed bone muscle-tendon ligament or joint. There is no malodor. Periwound integrity is intact.  Vascular:  Palpable pedal pulses bilaterally. No edema or erythema noted. Capillary refill within normal limits. Neurological:  Epicritic and protective threshold grossly intact bilaterally.  Musculoskeletal Exam:  No significant pain on palpation noted to the anterior medial and lateral aspects of the patient's left ankle joint. Mild edema noted.  Range of motion within normal limits to all pedal and ankle joints bilateral. Muscle strength 5/5 in all groups bilateral.   Assessment: #1 Onychomycosis of nail due to dermatophyte bilateral #2 left lateral ankle ulcer secondary to diabetes mellitus   Plan of Care:  #1 Patient was evaluated. #2 medically necessary excisional debridement including subcutaneous tissue was performed using a tissue nipper and a chisel blade. Excisional debridement of all  the necrotic nonviable tissue down to healthy bleeding viable tissue was performed with post-debridement measurements same as pre-. #3 the wound was cleansed and dry sterile dressing applied. #4 Mechanical debridement of nails 1-5 bilaterally performed using a nail nipper. Filed with dremel without incident.  #5 recommend follow-up with wound care for ulcers. #6 return to clinic in 3 months.    Edrick Kins, DPM [facility]  Dr. Edrick Kins, [address]                                        [place], [place] [address]                Office [phone]  Fax [phone]

## 2016-12-04 NOTE — Telephone Encounter (Signed)
PT called back requesting a referral, since  was advice by the Podiatry that he need a dermatology, can you please review the specialist note and let the PT know if you auth. The referral or not, please follow up

## 2016-12-04 NOTE — Telephone Encounter (Signed)
Patient is requesting a dermatology referral based on podiatry's recommendation. Please advise.

## 2016-12-04 NOTE — Telephone Encounter (Signed)
There is no mention of dermatology referral from podiatry note. He might want to check back with his podiatrist.

## 2016-12-04 NOTE — Telephone Encounter (Signed)
Patient needs to contact the podiatrist, per Dr. Jarold Song there is no note in the system from the podiatrist regarding dermatology.

## 2016-12-05 ENCOUNTER — Other Ambulatory Visit: Payer: Self-pay | Admitting: Family Medicine

## 2016-12-05 DIAGNOSIS — E785 Hyperlipidemia, unspecified: Secondary | ICD-10-CM

## 2016-12-06 ENCOUNTER — Other Ambulatory Visit: Payer: Self-pay | Admitting: Family Medicine

## 2016-12-06 DIAGNOSIS — E785 Hyperlipidemia, unspecified: Secondary | ICD-10-CM

## 2016-12-08 MED ORDER — ATORVASTATIN CALCIUM 10 MG PO TABS: 10.0000 mg | ORAL_TABLET | Freq: Every day | ORAL | 2 refills | Status: DC

## 2016-12-08 NOTE — Addendum Note (Signed)
Addended by: Rica Mast on: 12/08/2016 08:54 AM   Modules accepted: Orders

## 2016-12-15 ENCOUNTER — Telehealth: Payer: Self-pay | Admitting: Podiatry

## 2016-12-15 ENCOUNTER — Encounter: Payer: Self-pay | Admitting: Gastroenterology

## 2016-12-15 DIAGNOSIS — R238 Other skin changes: Secondary | ICD-10-CM

## 2016-12-15 NOTE — Telephone Encounter (Signed)
Pt stated Dr. Amalia Hailey was to send him to a dermatologist but to contact his PCP about it. His PCP is Dr. Jarold Song and requested an internal referral.

## 2016-12-15 NOTE — Telephone Encounter (Signed)
Melvin [name],  I remember recommending that the patient be referred to dermatology through his PCP, but I can't remember what for. I did not dictate into his previous note. I'm okay to referred to dermatology, however I cannot remember his referral diagnosis. We may have to contact the patient and ask. Sorry! Dr. Amalia Hailey

## 2016-12-16 NOTE — Addendum Note (Signed)
Addended by: Harriett Sine D on: 12/16/2016 11:54 AM   Modules accepted: Orders

## 2016-12-16 NOTE — Telephone Encounter (Addendum)
Left message requesting pt call with purpose of dermatology referral. Pt called states Dr. Amalia Hailey had noticed blisters and cracked skin to his feet and hands and recommended referral to dermatology. I informed pt of [facility] [phone] and I could fax the referral, to call their office if he had not received contact from them by Thursday afternoon. Pt states understanding. Faxed referral, clinicals and demographics to [facility].

## 2016-12-23 NOTE — Telephone Encounter (Signed)
Pt was inform.

## 2016-12-30 ENCOUNTER — Other Ambulatory Visit: Payer: Self-pay | Admitting: Family Medicine

## 2017-01-20 ENCOUNTER — Other Ambulatory Visit: Payer: Self-pay | Admitting: Dermatology

## 2017-01-20 DIAGNOSIS — L720 Epidermal cyst: Secondary | ICD-10-CM | POA: Diagnosis not present

## 2017-01-20 DIAGNOSIS — D492 Neoplasm of unspecified behavior of bone, soft tissue, and skin: Secondary | ICD-10-CM | POA: Diagnosis not present

## 2017-01-20 DIAGNOSIS — L905 Scar conditions and fibrosis of skin: Secondary | ICD-10-CM | POA: Diagnosis not present

## 2017-01-20 DIAGNOSIS — L309 Dermatitis, unspecified: Secondary | ICD-10-CM | POA: Diagnosis not present

## 2017-02-03 ENCOUNTER — Ambulatory Visit (AMBULATORY_SURGERY_CENTER): Payer: Self-pay | Admitting: *Deleted

## 2017-02-03 VITALS — Ht 68.0 in | Wt 169.0 lb

## 2017-02-03 DIAGNOSIS — Z1211 Encounter for screening for malignant neoplasm of colon: Secondary | ICD-10-CM

## 2017-02-03 MED ORDER — NA SULFATE-K SULFATE-MG SULF 17.5-3.13-1.6 GM/177ML PO SOLN: 1.0000 | Freq: Once | ORAL | 0 refills | Status: AC

## 2017-02-03 NOTE — Progress Notes (Signed)
No egg or soy allergy known to patient  No issues with past sedation with any surgeries  or procedures, no intubation problems  No diet pills per patient No home 02 use per patient  No blood thinners per patient  Pt denies issues with constipation chronically- he states an occ episode of constipation  No A fib or A flutter  EMMI video sent to pt's e mail - pt has no email for emmi

## 2017-02-05 ENCOUNTER — Encounter: Payer: Self-pay | Admitting: Gastroenterology

## 2017-02-13 ENCOUNTER — Encounter: Payer: Self-pay | Admitting: Family Medicine

## 2017-02-13 ENCOUNTER — Ambulatory Visit: Payer: Medicare HMO | Attending: Family Medicine | Admitting: Family Medicine

## 2017-02-13 VITALS — BP 110/72 | HR 94 | Temp 97.5°F | Ht 68.0 in | Wt 168.0 lb

## 2017-02-13 DIAGNOSIS — E1149 Type 2 diabetes mellitus with other diabetic neurological complication: Secondary | ICD-10-CM

## 2017-02-13 DIAGNOSIS — K591 Functional diarrhea: Secondary | ICD-10-CM

## 2017-02-13 DIAGNOSIS — I110 Hypertensive heart disease with heart failure: Secondary | ICD-10-CM | POA: Diagnosis not present

## 2017-02-13 DIAGNOSIS — Z79899 Other long term (current) drug therapy: Secondary | ICD-10-CM | POA: Diagnosis not present

## 2017-02-13 DIAGNOSIS — Z9889 Other specified postprocedural states: Secondary | ICD-10-CM | POA: Diagnosis not present

## 2017-02-13 DIAGNOSIS — R7989 Other specified abnormal findings of blood chemistry: Secondary | ICD-10-CM | POA: Diagnosis not present

## 2017-02-13 DIAGNOSIS — Z794 Long term (current) use of insulin: Secondary | ICD-10-CM | POA: Diagnosis not present

## 2017-02-13 DIAGNOSIS — K8689 Other specified diseases of pancreas: Secondary | ICD-10-CM | POA: Insufficient documentation

## 2017-02-13 DIAGNOSIS — Z23 Encounter for immunization: Secondary | ICD-10-CM

## 2017-02-13 DIAGNOSIS — Z7982 Long term (current) use of aspirin: Secondary | ICD-10-CM | POA: Diagnosis not present

## 2017-02-13 DIAGNOSIS — R748 Abnormal levels of other serum enzymes: Secondary | ICD-10-CM | POA: Diagnosis not present

## 2017-02-13 DIAGNOSIS — M79605 Pain in left leg: Secondary | ICD-10-CM

## 2017-02-13 DIAGNOSIS — R945 Abnormal results of liver function studies: Secondary | ICD-10-CM

## 2017-02-13 DIAGNOSIS — I509 Heart failure, unspecified: Secondary | ICD-10-CM | POA: Insufficient documentation

## 2017-02-13 DIAGNOSIS — E785 Hyperlipidemia, unspecified: Secondary | ICD-10-CM | POA: Insufficient documentation

## 2017-02-13 DIAGNOSIS — E870 Hyperosmolality and hypernatremia: Secondary | ICD-10-CM | POA: Insufficient documentation

## 2017-02-13 DIAGNOSIS — R197 Diarrhea, unspecified: Secondary | ICD-10-CM | POA: Insufficient documentation

## 2017-02-13 DIAGNOSIS — E1140 Type 2 diabetes mellitus with diabetic neuropathy, unspecified: Secondary | ICD-10-CM | POA: Diagnosis not present

## 2017-02-13 DIAGNOSIS — E0800 Diabetes mellitus due to underlying condition with hyperosmolarity without nonketotic hyperglycemic-hyperosmolar coma (NKHHC): Secondary | ICD-10-CM | POA: Diagnosis not present

## 2017-02-13 LAB — POCT GLYCOSYLATED HEMOGLOBIN (HGB A1C): HEMOGLOBIN A1C: 8.7

## 2017-02-13 LAB — GLUCOSE, POCT (MANUAL RESULT ENTRY): POC Glucose: 169 mg/dl — AB (ref 70–99)

## 2017-02-13 MED ORDER — NAPROXEN 500 MG PO TABS: 500.0000 mg | ORAL_TABLET | Freq: Two times a day (BID) | ORAL | 1 refills | Status: DC

## 2017-02-13 MED ORDER — GABAPENTIN 300 MG PO CAPS: 600.0000 mg | ORAL_CAPSULE | Freq: Two times a day (BID) | ORAL | 3 refills | Status: DC

## 2017-02-13 MED ORDER — INSULIN GLARGINE 100 UNIT/ML ~~LOC~~ SOLN: 30.0000 [IU] | Freq: Every day | SUBCUTANEOUS | 3 refills | Status: DC

## 2017-02-13 MED ORDER — PANCRELIPASE (LIP-PROT-AMYL) 20000-63000 UNITS PO CPEP: 20000.0000 [IU] | ORAL_CAPSULE | Freq: Three times a day (TID) | ORAL | 3 refills | Status: DC

## 2017-02-13 NOTE — Patient Instructions (Signed)
Diabetes Mellitus and Food It is important for you to manage your blood sugar (glucose) level. Your blood glucose level can be greatly affected by what you eat. Eating healthier foods in the appropriate amounts throughout the day at about the same time each day will help you control your blood glucose level. It can also help slow or prevent worsening of your diabetes mellitus. Healthy eating may even help you improve the level of your blood pressure and reach or maintain a healthy weight. General recommendations for healthful eating and cooking habits include:  Eating meals and snacks regularly. Avoid going long periods of time without eating to lose weight.  Eating a diet that consists mainly of plant-based foods, such as fruits, vegetables, nuts, legumes, and whole grains.  Using low-heat cooking methods, such as baking, instead of high-heat cooking methods, such as deep frying.  Work with your dietitian to make sure you understand how to use the Nutrition Facts information on food labels. How can food affect me? Carbohydrates Carbohydrates affect your blood glucose level more than any other type of food. Your dietitian will help you determine how many carbohydrates to eat at each meal and teach you how to count carbohydrates. Counting carbohydrates is important to keep your blood glucose at a healthy level, especially if you are using insulin or taking certain medicines for diabetes mellitus. Alcohol Alcohol can cause sudden decreases in blood glucose (hypoglycemia), especially if you use insulin or take certain medicines for diabetes mellitus. Hypoglycemia can be a life-threatening condition. Symptoms of hypoglycemia (sleepiness, dizziness, and disorientation) are similar to symptoms of having too much alcohol. If your health care provider has given you approval to drink alcohol, do so in moderation and use the following guidelines:  Women should not have more than one drink per day, and men  should not have more than two drinks per day. One drink is equal to: ? 12 oz of beer. ? 5 oz of wine. ? 1 oz of hard liquor.  Do not drink on an empty stomach.  Keep yourself hydrated. Have water, diet soda, or unsweetened iced tea.  Regular soda, juice, and other mixers might contain a lot of carbohydrates and should be counted.  What foods are not recommended? As you make food choices, it is important to remember that all foods are not the same. Some foods have fewer nutrients per serving than other foods, even though they might have the same number of calories or carbohydrates. It is difficult to get your body what it needs when you eat foods with fewer nutrients. Examples of foods that you should avoid that are high in calories and carbohydrates but low in nutrients include:  Trans fats (most processed foods list trans fats on the Nutrition Facts label).  Regular soda.  Juice.  Candy.  Sweets, such as cake, pie, doughnuts, and cookies.  Fried foods.  What foods can I eat? Eat nutrient-rich foods, which will nourish your body and keep you healthy. The food you should eat also will depend on several factors, including:  The calories you need.  The medicines you take.  Your weight.  Your blood glucose level.  Your blood pressure level.  Your cholesterol level.  You should eat a variety of foods, including:  Protein. ? Lean cuts of meat. ? Proteins low in saturated fats, such as fish, egg whites, and beans. Avoid processed meats.  Fruits and vegetables. ? Fruits and vegetables that may help control blood glucose levels, such as apples,   mangoes, and yams.  Dairy products. ? Choose fat-free or low-fat dairy products, such as milk, yogurt, and cheese.  Grains, bread, pasta, and rice. ? Choose whole grain products, such as multigrain bread, whole oats, and brown rice. These foods may help control blood pressure.  Fats. ? Foods containing healthful fats, such as  nuts, avocado, olive oil, canola oil, and fish.  Does everyone with diabetes mellitus have the same meal plan? Because every person with diabetes mellitus is different, there is not one meal plan that works for everyone. It is very important that you meet with a dietitian who will help you create a meal plan that is just right for you. This information is not intended to replace advice given to you by your health care provider. Make sure you discuss any questions you have with your health care provider. Document Released: 02/20/2005 Document Revised: 11/01/2015 Document Reviewed: 04/22/2013 Else[employer] Interactive Patient Education  2017 [employer]  
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
## 2017-02-14 LAB — CMP14+EGFR
ALBUMIN: 4.1 g/dL (ref 3.5–5.5)
ALK PHOS: 98 IU/L (ref 39–117)
ALT: 10 IU/L (ref 0–44)
AST: 18 IU/L (ref 0–40)
Albumin/Globulin Ratio: 1.2 (ref 1.2–2.2)
BUN / CREAT RATIO: 12 (ref 9–20)
BUN: 11 mg/dL (ref 6–24)
Bilirubin Total: 0.4 mg/dL (ref 0.0–1.2)
CO2: 21 mmol/L (ref 20–29)
CREATININE: 0.91 mg/dL (ref 0.76–1.27)
Calcium: 9.6 mg/dL (ref 8.7–10.2)
Chloride: 101 mmol/L (ref 96–106)
GFR calc non Af Amer: 93 mL/min/{1.73_m2} (ref >59)
GFR, EST AFRICAN AMERICAN: 107 mL/min/{1.73_m2} (ref >59)
Globulin, Total: 3.4 g/dL (ref 1.5–4.5)
Glucose: 170 mg/dL — ABNORMAL HIGH (ref 65–99)
Potassium: 3.9 mmol/L (ref 3.5–5.2)
SODIUM: 137 mmol/L (ref 134–144)
TOTAL PROTEIN: 7.5 g/dL (ref 6.0–8.5)

## 2017-02-14 LAB — GAMMA GT: GGT: 375 IU/L — AB (ref 0–65)

## 2017-02-16 ENCOUNTER — Telehealth: Payer: Self-pay

## 2017-02-16 NOTE — Telephone Encounter (Signed)
Pt was called and informed to contact office for lab results.

## 2017-02-17 ENCOUNTER — Telehealth: Payer: Self-pay | Admitting: Family Medicine

## 2017-02-17 ENCOUNTER — Ambulatory Visit (AMBULATORY_SURGERY_CENTER): Payer: Medicare HMO | Admitting: Gastroenterology

## 2017-02-17 ENCOUNTER — Encounter: Payer: Self-pay | Admitting: Gastroenterology

## 2017-02-17 VITALS — BP 121/77 | HR 79 | Temp 96.4°F | Resp 12 | Ht 68.0 in | Wt 168.0 lb

## 2017-02-17 DIAGNOSIS — Z1212 Encounter for screening for malignant neoplasm of rectum: Secondary | ICD-10-CM | POA: Diagnosis not present

## 2017-02-17 DIAGNOSIS — Z1211 Encounter for screening for malignant neoplasm of colon: Secondary | ICD-10-CM | POA: Diagnosis present

## 2017-02-17 DIAGNOSIS — D120 Benign neoplasm of cecum: Secondary | ICD-10-CM

## 2017-02-17 DIAGNOSIS — D122 Benign neoplasm of ascending colon: Secondary | ICD-10-CM | POA: Diagnosis not present

## 2017-02-17 DIAGNOSIS — E119 Type 2 diabetes mellitus without complications: Secondary | ICD-10-CM | POA: Diagnosis not present

## 2017-02-17 MED ORDER — SODIUM CHLORIDE 0.9 % IV SOLN: 500.0000 mL | INTRAVENOUS | Status: DC

## 2017-02-17 NOTE — Progress Notes (Signed)
Pt's states no medical or surgical changes since previsit or office visit. 

## 2017-02-17 NOTE — Progress Notes (Signed)
Called to room to assist during endoscopic procedure.  Patient ID and intended procedure confirmed with present staff. Received instructions for my participation in the procedure from the performing physician.  

## 2017-02-17 NOTE — Progress Notes (Signed)
Report to PACU, RN, vss, BBS= Clear.  

## 2017-02-17 NOTE — Op Note (Signed)
[place] Patient Name: Melvin [name] Procedure Date: 02/17/2017 8:10 AM MRN: [id number] Endoscopist: Remo Lipps P. Sajad Glander MD, MD Age: 56 Referring MD:  Date of Birth: 1960/08/07 Gender: Male Account #: [id number] Procedure:                Colonoscopy Indications:              Screening for colorectal malignant neoplasm, This                            is the patient's first colonoscopy Medicines:                Monitored Anesthesia Care Procedure:                Pre-Anesthesia Assessment:                           - Prior to the procedure, a History and Physical                            was performed, and patient medications and                            allergies were reviewed. The patient's tolerance of                            previous anesthesia was also reviewed. The risks                            and benefits of the procedure and the sedation                            options and risks were discussed with the patient.                            All questions were answered, and informed consent                            was obtained. Prior Anticoagulants: The patient has                            taken no previous anticoagulant or antiplatelet                            agents. ASA Grade Assessment: III - A patient with                            severe systemic disease. After reviewing the risks                            and benefits, the patient was deemed in                            satisfactory condition to undergo the procedure.  After obtaining informed consent, the colonoscope                            was passed under direct vision. Throughout the                            procedure, the patient's blood pressure, pulse, and                            oxygen saturations were monitored continuously. The                            Colonoscope was introduced through the anus and                            advanced to the  the cecum, identified by                            appendiceal orifice and ileocecal valve. The                            colonoscopy was performed without difficulty. The                            patient tolerated the procedure well. The quality                            of the bowel preparation was adequate. The                            ileocecal valve, appendiceal orifice, and rectum                            were photographed. Scope In: 8:25:23 AM Scope Out: 8:47:41 AM Scope Withdrawal Time: 0 hours 18 minutes 52 seconds  Total Procedure Duration: 0 hours 22 minutes 18 seconds  Findings:                 The perianal and digital rectal examinations were                            normal.                           A diminutive polyp was found in the cecum. The                            polyp was sessile. The polyp was removed with a                            cold snare. Resection was complete, but the polyp                            tissue was not retrieved.  Two sessile polyps were found in the ascending                            colon. The polyps were 3 to 4 mm in size. These                            polyps were removed with a cold snare. Resection                            and retrieval were complete.                           A few medium-mouthed diverticula were found in the                            ascending colon.                           The exam was otherwise without abnormality. The                            patient did not retain air well in the left colon                            which prolonged the procedure. Complications:            No immediate complications. Estimated blood loss:                            Minimal. Estimated Blood Loss:     Estimated blood loss was minimal. Impression:               - One diminutive polyp in the cecum, removed with a                            cold snare. Complete resection. Polyp tissue  not                            retrieved.                           - Two 3 to 4 mm polyps in the ascending colon,                            removed with a cold snare. Resected and retrieved.                           - Diverticulosis in the ascending colon.                           - The examination was otherwise normal. Recommendation:           - Patient has a contact number available for  emergencies. The signs and symptoms of potential                            delayed complications were discussed with the                            patient. Return to normal activities tomorrow.                            Written discharge instructions were provided to the                            patient.                           - Resume previous diet.                           - Continue present medications.                           - Await pathology results.                           - Repeat colonoscopy is recommended for                            surveillance. The colonoscopy date will be                            determined after pathology results from today's                            exam become available for review.                           - No ibuprofen, naproxen, or other non-steroidal                            anti-inflammatory drugs for 2 weeks after polyp                            removal. Remo Lipps P. Rozlyn Yerby MD, MD 02/17/2017 8:51:31 AM This report has been signed electronically.

## 2017-02-17 NOTE — Telephone Encounter (Signed)
Pt called to request the lab result , please follow up

## 2017-02-17 NOTE — Patient Instructions (Signed)
Handouts given on polyps and diverticulosis  YOU HAD AN ENDOSCOPIC PROCEDURE TODAY: Refer to the procedure report and other information in the discharge instructions given to you for any specific questions about what was found during the examination. If this information does not answer your questions, please call [place] office at [phone] to clarify.   YOU SHOULD EXPECT: Some feelings of bloating in the abdomen. Passage of more gas than usual. Walking can help get rid of the air that was put into your GI tract during the procedure and reduce the bloating. If you had a lower endoscopy (such as a colonoscopy or flexible sigmoidoscopy) you may notice spotting of blood in your stool or on the toilet paper. Some abdominal soreness may be present for a day or two, also.  DIET: Your first meal following the procedure should be a light meal and then it is ok to progress to your normal diet. A half-sandwich or bowl of soup is an example of a good first meal. Heavy or fried foods are harder to digest and may make you feel nauseous or bloated. Drink plenty of fluids but you should avoid alcoholic beverages for 24 hours. If you had a esophageal dilation, please see attached instructions for diet.    ACTIVITY: Your care partner should take you home directly after the procedure. You should plan to take it easy, moving slowly for the rest of the day. You can resume normal activity the day after the procedure however YOU SHOULD NOT DRIVE, use power tools, machinery or perform tasks that involve climbing or major physical exertion for 24 hours (because of the sedation medicines used during the test).   SYMPTOMS TO REPORT IMMEDIATELY: A gastroenterologist can be reached at any hour. Please call [phone]  for any of the following symptoms:  Following lower endoscopy (colonoscopy, flexible sigmoidoscopy) Excessive amounts of blood in the stool  Significant tenderness, worsening of abdominal pains  Swelling of  the abdomen that is new, acute  Fever of 100 or higher    FOLLOW UP:  If any biopsies were taken you will be contacted by phone or by letter within the next 1-3 weeks. Call [phone]  if you have not heard about the biopsies in 3 weeks.  Please also call with any specific questions about appointments or follow up tests.  

## 2017-02-18 ENCOUNTER — Other Ambulatory Visit: Payer: Self-pay | Admitting: Family Medicine

## 2017-02-18 ENCOUNTER — Other Ambulatory Visit: Payer: Self-pay

## 2017-02-18 ENCOUNTER — Telehealth: Payer: Self-pay | Admitting: *Deleted

## 2017-02-18 ENCOUNTER — Telehealth: Payer: Self-pay

## 2017-02-18 DIAGNOSIS — K591 Functional diarrhea: Secondary | ICD-10-CM | POA: Diagnosis not present

## 2017-02-18 NOTE — Telephone Encounter (Signed)
Name identifier, left a voicemail. 

## 2017-02-18 NOTE — Telephone Encounter (Signed)
  Follow up Call-  Call back number 02/17/2017  Post procedure Call Back phone  # [phone]  Permission to leave phone message Yes  Some recent data might be hidden     Patient questions:  Do you have a fever, pain , or abdominal swelling? No. Pain Score  0 *  Have you tolerated food without any problems? Yes.    Have you been able to return to your normal activities? Yes.    Do you have any questions about your discharge instructions: Diet   No. Medications  No. Follow up visit  No.  Do you have questions or concerns about your Care? No.  Actions: * If pain score is 4 or above: No action needed, pain <4.

## 2017-02-24 ENCOUNTER — Encounter: Payer: Self-pay | Admitting: Gastroenterology

## 2017-02-25 LAB — FECAL FAT, QUALITATIVE
FAT QUAL TOTAL STL: INCREASED
Fat Qual Neutral, Stl: INCREASED

## 2017-02-25 LAB — STOOL CULTURE: E COLI SHIGA TOXIN ASSAY: NEGATIVE

## 2017-02-25 LAB — CLOSTRIDIUM DIFFICILE BY PCR: Toxigenic C. Difficile by PCR: NEGATIVE

## 2017-03-02 ENCOUNTER — Telehealth: Payer: Self-pay | Admitting: Family Medicine

## 2017-03-02 NOTE — Telephone Encounter (Signed)
Pt was called and informed of lab results. 

## 2017-03-02 NOTE — Telephone Encounter (Signed)
Pt called requesting results, pt states he brought sample into the lab . Please f/up

## 2017-03-19 ENCOUNTER — Telehealth: Payer: Self-pay | Admitting: Family Medicine

## 2017-03-19 NOTE — Telephone Encounter (Signed)
Called pt. And LVM to come to the office and fill out a medical release form to fax over to [facility] and get his records so that his PCP can have them.

## 2017-04-07 ENCOUNTER — Other Ambulatory Visit: Payer: Self-pay | Admitting: Family Medicine

## 2017-04-27 ENCOUNTER — Other Ambulatory Visit: Payer: Self-pay | Admitting: Family Medicine

## 2017-04-27 DIAGNOSIS — M79605 Pain in left leg: Secondary | ICD-10-CM

## 2017-05-20 ENCOUNTER — Ambulatory Visit: Payer: Medicare HMO | Admitting: Family Medicine

## 2017-05-25 ENCOUNTER — Other Ambulatory Visit: Payer: Self-pay | Admitting: Internal Medicine

## 2017-05-25 ENCOUNTER — Other Ambulatory Visit: Payer: Self-pay | Admitting: Family Medicine

## 2017-05-25 DIAGNOSIS — E785 Hyperlipidemia, unspecified: Secondary | ICD-10-CM

## 2017-06-10 ENCOUNTER — Emergency Department (HOSPITAL_COMMUNITY): Payer: Medicare HMO

## 2017-06-10 ENCOUNTER — Other Ambulatory Visit: Payer: Self-pay

## 2017-06-10 ENCOUNTER — Inpatient Hospital Stay (HOSPITAL_COMMUNITY): Payer: Medicare HMO | Admitting: Anesthesiology

## 2017-06-10 ENCOUNTER — Encounter: Payer: Self-pay | Admitting: Family Medicine

## 2017-06-10 ENCOUNTER — Encounter (HOSPITAL_COMMUNITY)
Admission: EM | Disposition: A | Discharge status: Home Health | Payer: Self-pay | Source: Home / Self Care | Attending: Oncology

## 2017-06-10 ENCOUNTER — Inpatient Hospital Stay (HOSPITAL_COMMUNITY)
Admission: EM | Admit: 2017-06-10 | Discharge: 2017-06-15 | DRG: 982 | Disposition: A | Discharge status: Home Health | Payer: Medicare HMO | Attending: Oncology | Admitting: Oncology

## 2017-06-10 ENCOUNTER — Encounter (HOSPITAL_COMMUNITY): Payer: Self-pay

## 2017-06-10 ENCOUNTER — Ambulatory Visit: Payer: Medicare HMO | Attending: Family Medicine | Admitting: Family Medicine

## 2017-06-10 VITALS — BP 99/60 | HR 108 | Temp 98.6°F | Ht 68.0 in | Wt 163.0 lb

## 2017-06-10 DIAGNOSIS — E119 Type 2 diabetes mellitus without complications: Secondary | ICD-10-CM

## 2017-06-10 DIAGNOSIS — K8681 Exocrine pancreatic insufficiency: Secondary | ICD-10-CM | POA: Diagnosis not present

## 2017-06-10 DIAGNOSIS — E785 Hyperlipidemia, unspecified: Secondary | ICD-10-CM | POA: Diagnosis present

## 2017-06-10 DIAGNOSIS — K8689 Other specified diseases of pancreas: Secondary | ICD-10-CM

## 2017-06-10 DIAGNOSIS — L98498 Non-pressure chronic ulcer of skin of other sites with other specified severity: Secondary | ICD-10-CM | POA: Diagnosis not present

## 2017-06-10 DIAGNOSIS — E1140 Type 2 diabetes mellitus with diabetic neuropathy, unspecified: Secondary | ICD-10-CM | POA: Insufficient documentation

## 2017-06-10 DIAGNOSIS — L98499 Non-pressure chronic ulcer of skin of other sites with unspecified severity: Secondary | ICD-10-CM | POA: Diagnosis not present

## 2017-06-10 DIAGNOSIS — E0800 Diabetes mellitus due to underlying condition with hyperosmolarity without nonketotic hyperglycemic-hyperosmolar coma (NKHHC): Secondary | ICD-10-CM

## 2017-06-10 DIAGNOSIS — I959 Hypotension, unspecified: Secondary | ICD-10-CM | POA: Diagnosis not present

## 2017-06-10 DIAGNOSIS — Z8249 Family history of ischemic heart disease and other diseases of the circulatory system: Secondary | ICD-10-CM

## 2017-06-10 DIAGNOSIS — M869 Osteomyelitis, unspecified: Secondary | ICD-10-CM | POA: Diagnosis present

## 2017-06-10 DIAGNOSIS — S81802A Unspecified open wound, left lower leg, initial encounter: Secondary | ICD-10-CM | POA: Diagnosis not present

## 2017-06-10 DIAGNOSIS — R000 Tachycardia, unspecified: Secondary | ICD-10-CM | POA: Diagnosis not present

## 2017-06-10 DIAGNOSIS — L97321 Non-pressure chronic ulcer of left ankle limited to breakdown of skin: Secondary | ICD-10-CM | POA: Diagnosis present

## 2017-06-10 DIAGNOSIS — L02414 Cutaneous abscess of left upper limb: Secondary | ICD-10-CM | POA: Diagnosis not present

## 2017-06-10 DIAGNOSIS — I10 Essential (primary) hypertension: Secondary | ICD-10-CM | POA: Diagnosis not present

## 2017-06-10 DIAGNOSIS — Z23 Encounter for immunization: Secondary | ICD-10-CM | POA: Diagnosis not present

## 2017-06-10 DIAGNOSIS — E1149 Type 2 diabetes mellitus with other diabetic neurological complication: Secondary | ICD-10-CM

## 2017-06-10 DIAGNOSIS — B954 Other streptococcus as the cause of diseases classified elsewhere: Secondary | ICD-10-CM | POA: Diagnosis not present

## 2017-06-10 DIAGNOSIS — E11622 Type 2 diabetes mellitus with other skin ulcer: Secondary | ICD-10-CM

## 2017-06-10 DIAGNOSIS — E1142 Type 2 diabetes mellitus with diabetic polyneuropathy: Secondary | ICD-10-CM | POA: Diagnosis present

## 2017-06-10 DIAGNOSIS — B9689 Other specified bacterial agents as the cause of diseases classified elsewhere: Secondary | ICD-10-CM | POA: Diagnosis not present

## 2017-06-10 DIAGNOSIS — Z791 Long term (current) use of non-steroidal anti-inflammatories (NSAID): Secondary | ICD-10-CM | POA: Insufficient documentation

## 2017-06-10 DIAGNOSIS — I509 Heart failure, unspecified: Secondary | ICD-10-CM | POA: Diagnosis not present

## 2017-06-10 DIAGNOSIS — L97921 Non-pressure chronic ulcer of unspecified part of left lower leg limited to breakdown of skin: Secondary | ICD-10-CM

## 2017-06-10 DIAGNOSIS — E1169 Type 2 diabetes mellitus with other specified complication: Principal | ICD-10-CM | POA: Diagnosis present

## 2017-06-10 DIAGNOSIS — B009 Herpesviral infection, unspecified: Secondary | ICD-10-CM | POA: Diagnosis present

## 2017-06-10 DIAGNOSIS — M7989 Other specified soft tissue disorders: Secondary | ICD-10-CM | POA: Insufficient documentation

## 2017-06-10 DIAGNOSIS — F1099 Alcohol use, unspecified with unspecified alcohol-induced disorder: Secondary | ICD-10-CM | POA: Diagnosis not present

## 2017-06-10 DIAGNOSIS — I110 Hypertensive heart disease with heart failure: Secondary | ICD-10-CM | POA: Diagnosis not present

## 2017-06-10 DIAGNOSIS — R5082 Postprocedural fever: Secondary | ICD-10-CM | POA: Diagnosis not present

## 2017-06-10 DIAGNOSIS — K860 Alcohol-induced chronic pancreatitis: Secondary | ICD-10-CM | POA: Insufficient documentation

## 2017-06-10 DIAGNOSIS — I5032 Chronic diastolic (congestive) heart failure: Secondary | ICD-10-CM | POA: Diagnosis not present

## 2017-06-10 DIAGNOSIS — Z79899 Other long term (current) drug therapy: Secondary | ICD-10-CM

## 2017-06-10 DIAGNOSIS — L97329 Non-pressure chronic ulcer of left ankle with unspecified severity: Secondary | ICD-10-CM | POA: Diagnosis not present

## 2017-06-10 DIAGNOSIS — L03114 Cellulitis of left upper limb: Secondary | ICD-10-CM | POA: Diagnosis not present

## 2017-06-10 DIAGNOSIS — F1020 Alcohol dependence, uncomplicated: Secondary | ICD-10-CM | POA: Diagnosis present

## 2017-06-10 DIAGNOSIS — N182 Chronic kidney disease, stage 2 (mild): Secondary | ICD-10-CM | POA: Diagnosis present

## 2017-06-10 DIAGNOSIS — L02416 Cutaneous abscess of left lower limb: Secondary | ICD-10-CM | POA: Insufficient documentation

## 2017-06-10 DIAGNOSIS — M86132 Other acute osteomyelitis, left radius and ulna: Secondary | ICD-10-CM | POA: Diagnosis present

## 2017-06-10 DIAGNOSIS — E870 Hyperosmolality and hypernatremia: Secondary | ICD-10-CM | POA: Diagnosis not present

## 2017-06-10 DIAGNOSIS — I130 Hypertensive heart and chronic kidney disease with heart failure and stage 1 through stage 4 chronic kidney disease, or unspecified chronic kidney disease: Secondary | ICD-10-CM | POA: Diagnosis not present

## 2017-06-10 DIAGNOSIS — L0291 Cutaneous abscess, unspecified: Secondary | ICD-10-CM

## 2017-06-10 DIAGNOSIS — Z8661 Personal history of infections of the central nervous system: Secondary | ICD-10-CM | POA: Insufficient documentation

## 2017-06-10 DIAGNOSIS — Z794 Long term (current) use of insulin: Secondary | ICD-10-CM | POA: Diagnosis not present

## 2017-06-10 DIAGNOSIS — Z22322 Carrier or suspected carrier of Methicillin resistant Staphylococcus aureus: Secondary | ICD-10-CM

## 2017-06-10 DIAGNOSIS — N179 Acute kidney failure, unspecified: Secondary | ICD-10-CM | POA: Diagnosis not present

## 2017-06-10 DIAGNOSIS — E1165 Type 2 diabetes mellitus with hyperglycemia: Secondary | ICD-10-CM | POA: Diagnosis not present

## 2017-06-10 DIAGNOSIS — Z7982 Long term (current) use of aspirin: Secondary | ICD-10-CM

## 2017-06-10 DIAGNOSIS — Z9889 Other specified postprocedural states: Secondary | ICD-10-CM | POA: Insufficient documentation

## 2017-06-10 DIAGNOSIS — M868X4 Other osteomyelitis, hand: Secondary | ICD-10-CM | POA: Diagnosis not present

## 2017-06-10 DIAGNOSIS — K219 Gastro-esophageal reflux disease without esophagitis: Secondary | ICD-10-CM | POA: Diagnosis present

## 2017-06-10 DIAGNOSIS — I129 Hypertensive chronic kidney disease with stage 1 through stage 4 chronic kidney disease, or unspecified chronic kidney disease: Secondary | ICD-10-CM | POA: Diagnosis not present

## 2017-06-10 DIAGNOSIS — A491 Streptococcal infection, unspecified site: Secondary | ICD-10-CM | POA: Diagnosis present

## 2017-06-10 DIAGNOSIS — E1122 Type 2 diabetes mellitus with diabetic chronic kidney disease: Secondary | ICD-10-CM | POA: Diagnosis present

## 2017-06-10 DIAGNOSIS — F1410 Cocaine abuse, uncomplicated: Secondary | ICD-10-CM | POA: Diagnosis present

## 2017-06-10 DIAGNOSIS — L97929 Non-pressure chronic ulcer of unspecified part of left lower leg with unspecified severity: Secondary | ICD-10-CM

## 2017-06-10 DIAGNOSIS — Z833 Family history of diabetes mellitus: Secondary | ICD-10-CM

## 2017-06-10 DIAGNOSIS — R509 Fever, unspecified: Secondary | ICD-10-CM | POA: Diagnosis not present

## 2017-06-10 HISTORY — PX: I & D EXTREMITY: SHX5045

## 2017-06-10 LAB — CBC WITH DIFFERENTIAL/PLATELET
Basophils Absolute: 0 10*3/uL (ref 0.0–0.1)
Basophils Relative: 0 %
EOS ABS: 0 10*3/uL (ref 0.0–0.7)
Eosinophils Relative: 1 %
HEMATOCRIT: 34.5 % — AB (ref 39.0–52.0)
HEMOGLOBIN: 11.9 g/dL — AB (ref 13.0–17.0)
Lymphocytes Relative: 11 %
Lymphs Abs: 0.8 10*3/uL (ref 0.7–4.0)
MCH: 33.1 pg (ref 26.0–34.0)
MCHC: 34.5 g/dL (ref 30.0–36.0)
MCV: 95.8 fL (ref 78.0–100.0)
Monocytes Absolute: 1.3 10*3/uL — ABNORMAL HIGH (ref 0.1–1.0)
Monocytes Relative: 18 %
NEUTROS ABS: 4.9 10*3/uL (ref 1.7–7.7)
NEUTROS PCT: 70 %
Platelets: 217 10*3/uL (ref 150–400)
RBC: 3.6 MIL/uL — AB (ref 4.22–5.81)
RDW: 14.2 % (ref 11.5–15.5)
WBC: 7 10*3/uL (ref 4.0–10.5)

## 2017-06-10 LAB — URINALYSIS, ROUTINE W REFLEX MICROSCOPIC
Bilirubin Urine: NEGATIVE
GLUCOSE, UA: 50 mg/dL — AB
KETONES UR: 5 mg/dL — AB
LEUKOCYTES UA: NEGATIVE
Nitrite: NEGATIVE
PH: 5 (ref 5.0–8.0)
Protein, ur: 100 mg/dL — AB
Specific Gravity, Urine: 1.02 (ref 1.005–1.030)

## 2017-06-10 LAB — COMPREHENSIVE METABOLIC PANEL
ALK PHOS: 82 U/L (ref 38–126)
ALT: 15 U/L — AB (ref 17–63)
ANION GAP: 7 (ref 5–15)
AST: 21 U/L (ref 15–41)
Albumin: 2.6 g/dL — ABNORMAL LOW (ref 3.5–5.0)
BUN: 12 mg/dL (ref 6–20)
CALCIUM: 8.6 mg/dL — AB (ref 8.9–10.3)
CHLORIDE: 100 mmol/L — AB (ref 101–111)
CO2: 23 mmol/L (ref 22–32)
CREATININE: 1.44 mg/dL — AB (ref 0.61–1.24)
GFR calc non Af Amer: 52 mL/min — ABNORMAL LOW (ref >60)
Glucose, Bld: 190 mg/dL — ABNORMAL HIGH (ref 65–99)
Potassium: 3.9 mmol/L (ref 3.5–5.1)
SODIUM: 130 mmol/L — AB (ref 135–145)
Total Bilirubin: 2.1 mg/dL — ABNORMAL HIGH (ref 0.3–1.2)
Total Protein: 7.6 g/dL (ref 6.5–8.1)

## 2017-06-10 LAB — GLUCOSE, CAPILLARY
GLUCOSE-CAPILLARY: 132 mg/dL — AB (ref 65–99)
Glucose-Capillary: 130 mg/dL — ABNORMAL HIGH (ref 65–99)

## 2017-06-10 LAB — MRSA PCR SCREENING: MRSA BY PCR: POSITIVE — AB

## 2017-06-10 LAB — I-STAT CG4 LACTIC ACID, ED
LACTIC ACID, VENOUS: 1.47 mmol/L (ref 0.5–1.9)
Lactic Acid, Venous: 0.77 mmol/L (ref 0.5–1.9)

## 2017-06-10 LAB — GLUCOSE, POCT (MANUAL RESULT ENTRY): POC Glucose: 179 mg/dl — AB (ref 70–99)

## 2017-06-10 LAB — ETHANOL: Alcohol, Ethyl (B): <10 mg/dL (ref <10)

## 2017-06-10 LAB — HEMOGLOBIN A1C
Hgb A1c MFr Bld: 9 % — ABNORMAL HIGH (ref 4.8–5.6)
Mean Plasma Glucose: 211.6 mg/dL

## 2017-06-10 LAB — POCT GLYCOSYLATED HEMOGLOBIN (HGB A1C): HEMOGLOBIN A1C: 10

## 2017-06-10 SURGERY — IRRIGATION AND DEBRIDEMENT EXTREMITY
Anesthesia: General | Site: Wrist | Laterality: Left

## 2017-06-10 MED ORDER — GABAPENTIN 300 MG PO CAPS: 600.0000 mg | ORAL_CAPSULE | Freq: Two times a day (BID) | ORAL | 3 refills | Status: DC

## 2017-06-10 MED ORDER — CHLORHEXIDINE GLUCONATE CLOTH 2 % EX PADS
6.0000 | MEDICATED_PAD | Freq: Every day | CUTANEOUS | Status: DC
  Administered 2017-06-12 – 2017-06-15 (×3): 6 via TOPICAL

## 2017-06-10 MED ORDER — FENTANYL CITRATE (PF) 250 MCG/5ML IJ SOLN
INTRAMUSCULAR | Status: AC
  Filled 2017-06-10: qty 5

## 2017-06-10 MED ORDER — ACETAMINOPHEN 325 MG PO TABS
650.0000 mg | ORAL_TABLET | Freq: Once | ORAL | Status: AC
  Administered 2017-06-10: 650 mg via ORAL
  Filled 2017-06-10: qty 2

## 2017-06-10 MED ORDER — INSULIN GLARGINE 100 UNIT/ML ~~LOC~~ SOLN
16.0000 [IU] | Freq: Every day | SUBCUTANEOUS | Status: DC
  Administered 2017-06-11: 16 [IU] via SUBCUTANEOUS
  Filled 2017-06-10 (×2): qty 0.16

## 2017-06-10 MED ORDER — ONDANSETRON HCL 4 MG/2ML IJ SOLN
INTRAMUSCULAR | Status: AC
  Filled 2017-06-10: qty 2

## 2017-06-10 MED ORDER — GABAPENTIN 300 MG PO CAPS
600.0000 mg | ORAL_CAPSULE | Freq: Two times a day (BID) | ORAL | Status: DC
  Administered 2017-06-11 – 2017-06-15 (×9): 600 mg via ORAL
  Filled 2017-06-10 (×9): qty 2

## 2017-06-10 MED ORDER — INSULIN ASPART 100 UNIT/ML ~~LOC~~ SOLN: SUBCUTANEOUS | 3 refills | Status: DC

## 2017-06-10 MED ORDER — LACTATED RINGERS IV SOLN
INTRAVENOUS | Status: DC
  Administered 2017-06-10: 18:00:00 via INTRAVENOUS

## 2017-06-10 MED ORDER — INSULIN ASPART 100 UNIT/ML ~~LOC~~ SOLN
0.0000 [IU] | Freq: Three times a day (TID) | SUBCUTANEOUS | Status: DC
  Administered 2017-06-11: 8 [IU] via SUBCUTANEOUS
  Administered 2017-06-11: 11 [IU] via SUBCUTANEOUS
  Administered 2017-06-11: 3 [IU] via SUBCUTANEOUS
  Administered 2017-06-12: 2 [IU] via SUBCUTANEOUS
  Administered 2017-06-12: 3 [IU] via SUBCUTANEOUS
  Administered 2017-06-13: 5 [IU] via SUBCUTANEOUS
  Administered 2017-06-13: 8 [IU] via SUBCUTANEOUS
  Administered 2017-06-14 (×3): 3 [IU] via SUBCUTANEOUS
  Administered 2017-06-15: 2 [IU] via SUBCUTANEOUS
  Administered 2017-06-15: 3 [IU] via SUBCUTANEOUS

## 2017-06-10 MED ORDER — SUCCINYLCHOLINE CHLORIDE 20 MG/ML IJ SOLN
INTRAMUSCULAR | Status: DC | PRN
  Administered 2017-06-10: 100 mg via INTRAVENOUS

## 2017-06-10 MED ORDER — PANCRELIPASE (LIP-PROT-AMYL) 12000-38000 UNITS PO CPEP
24000.0000 [IU] | ORAL_CAPSULE | Freq: Three times a day (TID) | ORAL | Status: DC
  Administered 2017-06-11 – 2017-06-15 (×15): 24000 [IU] via ORAL
  Filled 2017-06-10 (×17): qty 2

## 2017-06-10 MED ORDER — VANCOMYCIN HCL 10 G IV SOLR
1500.0000 mg | Freq: Once | INTRAVENOUS | Status: AC
  Administered 2017-06-10: 1500 mg via INTRAVENOUS
  Filled 2017-06-10: qty 1500

## 2017-06-10 MED ORDER — PROMETHAZINE HCL 25 MG/ML IJ SOLN: 6.2500 mg | INTRAMUSCULAR | Status: DC | PRN

## 2017-06-10 MED ORDER — PROPOFOL 10 MG/ML IV BOLUS
INTRAVENOUS | Status: AC
  Filled 2017-06-10: qty 20

## 2017-06-10 MED ORDER — LIDOCAINE HCL (CARDIAC) 20 MG/ML IV SOLN
INTRAVENOUS | Status: DC | PRN
  Administered 2017-06-10: 30 mg via INTRAVENOUS

## 2017-06-10 MED ORDER — PIPERACILLIN-TAZOBACTAM 3.375 G IVPB 30 MIN
3.3750 g | Freq: Once | INTRAVENOUS | Status: AC
  Administered 2017-06-10: 3.375 g via INTRAVENOUS
  Filled 2017-06-10: qty 50

## 2017-06-10 MED ORDER — PHENYLEPHRINE 40 MCG/ML (10ML) SYRINGE FOR IV PUSH (FOR BLOOD PRESSURE SUPPORT)
PREFILLED_SYRINGE | INTRAVENOUS | Status: AC
  Filled 2017-06-10: qty 20

## 2017-06-10 MED ORDER — LIDOCAINE 2% (20 MG/ML) 5 ML SYRINGE
INTRAMUSCULAR | Status: AC
  Filled 2017-06-10: qty 5

## 2017-06-10 MED ORDER — MUPIROCIN 2 % EX OINT
1.0000 "application " | TOPICAL_OINTMENT | Freq: Two times a day (BID) | CUTANEOUS | Status: AC
  Administered 2017-06-10 – 2017-06-15 (×10): 1 via NASAL
  Filled 2017-06-10: qty 22

## 2017-06-10 MED ORDER — FENTANYL CITRATE (PF) 100 MCG/2ML IJ SOLN
25.0000 ug | INTRAMUSCULAR | Status: DC | PRN
  Administered 2017-06-10: 25 ug via INTRAVENOUS

## 2017-06-10 MED ORDER — ATORVASTATIN CALCIUM 10 MG PO TABS
10.0000 mg | ORAL_TABLET | Freq: Every day | ORAL | Status: DC
  Administered 2017-06-11 – 2017-06-15 (×5): 10 mg via ORAL
  Filled 2017-06-10 (×5): qty 1

## 2017-06-10 MED ORDER — FENTANYL CITRATE (PF) 100 MCG/2ML IJ SOLN
INTRAMUSCULAR | Status: AC
  Filled 2017-06-10: qty 2

## 2017-06-10 MED ORDER — ONDANSETRON HCL 4 MG/2ML IJ SOLN
4.0000 mg | Freq: Once | INTRAMUSCULAR | Status: AC
  Administered 2017-06-10: 4 mg via INTRAVENOUS
  Filled 2017-06-10: qty 2

## 2017-06-10 MED ORDER — CHLORHEXIDINE GLUCONATE 4 % EX LIQD
60.0000 mL | Freq: Once | CUTANEOUS | Status: DC
  Filled 2017-06-10: qty 60

## 2017-06-10 MED ORDER — DEXTROSE 5 % IV SOLN
1.0000 g | Freq: Two times a day (BID) | INTRAVENOUS | Status: DC
  Administered 2017-06-11 – 2017-06-13 (×5): 1 g via INTRAVENOUS
  Filled 2017-06-10 (×6): qty 1

## 2017-06-10 MED ORDER — PANCRELIPASE (LIP-PROT-AMYL) 20000-63000 UNITS PO CPEP: 20000.0000 [IU] | ORAL_CAPSULE | Freq: Three times a day (TID) | ORAL | 3 refills | Status: DC

## 2017-06-10 MED ORDER — VANCOMYCIN HCL IN DEXTROSE 750-5 MG/150ML-% IV SOLN
750.0000 mg | Freq: Two times a day (BID) | INTRAVENOUS | Status: DC
  Administered 2017-06-11 – 2017-06-14 (×6): 750 mg via INTRAVENOUS
  Filled 2017-06-10 (×8): qty 150

## 2017-06-10 MED ORDER — SUCCINYLCHOLINE CHLORIDE 200 MG/10ML IV SOSY
PREFILLED_SYRINGE | INTRAVENOUS | Status: AC
  Filled 2017-06-10: qty 10

## 2017-06-10 MED ORDER — INSULIN GLARGINE 100 UNIT/ML ~~LOC~~ SOLN: 32.0000 [IU] | Freq: Every day | SUBCUTANEOUS | 3 refills | Status: DC

## 2017-06-10 MED ORDER — SODIUM CHLORIDE 0.9 % IV BOLUS (SEPSIS)
1000.0000 mL | Freq: Once | INTRAVENOUS | Status: AC
  Administered 2017-06-10: 1000 mL via INTRAVENOUS

## 2017-06-10 MED ORDER — 0.9 % SODIUM CHLORIDE (POUR BTL) OPTIME
TOPICAL | Status: DC | PRN
  Administered 2017-06-10: 1000 mL

## 2017-06-10 MED ORDER — LACTATED RINGERS IV SOLN
INTRAVENOUS | Status: DC | PRN
  Administered 2017-06-10 (×2): via INTRAVENOUS

## 2017-06-10 MED ORDER — POVIDONE-IODINE 10 % EX SWAB: 2.0000 "application " | Freq: Once | CUTANEOUS | Status: DC

## 2017-06-10 MED ORDER — FENTANYL CITRATE (PF) 100 MCG/2ML IJ SOLN
INTRAMUSCULAR | Status: DC | PRN
  Administered 2017-06-10 (×2): 50 ug via INTRAVENOUS

## 2017-06-10 MED ORDER — PHENYLEPHRINE HCL 10 MG/ML IJ SOLN
INTRAMUSCULAR | Status: DC | PRN
  Administered 2017-06-10: 100 ug via INTRAVENOUS

## 2017-06-10 MED ORDER — SODIUM CHLORIDE 0.9 % IR SOLN
Status: DC | PRN
  Administered 2017-06-10: 3000 mL

## 2017-06-10 MED ORDER — PROPOFOL 10 MG/ML IV BOLUS
INTRAVENOUS | Status: DC | PRN
  Administered 2017-06-10: 140 mg via INTRAVENOUS

## 2017-06-10 MED ORDER — ONDANSETRON HCL 4 MG/2ML IJ SOLN
INTRAMUSCULAR | Status: DC | PRN
  Administered 2017-06-10: 4 mg via INTRAVENOUS

## 2017-06-10 MED ORDER — PHENYLEPHRINE HCL 10 MG/ML IJ SOLN: INTRAMUSCULAR | Status: DC | PRN

## 2017-06-10 SURGICAL SUPPLY — 57 items
BANDAGE COBAN STERILE 2 (GAUZE/BANDAGES/DRESSINGS) IMPLANT
BLADE MINI RND TIP GREEN BEAV (BLADE) IMPLANT
BLADE SURG 15 STRL LF DISP TIS (BLADE) ×1 IMPLANT
BLADE SURG 15 STRL SS (BLADE)
BNDG COHESIVE 4X5 TAN STRL (GAUZE/BANDAGES/DRESSINGS) ×3 IMPLANT
BNDG GAUZE ELAST 4 BULKY (GAUZE/BANDAGES/DRESSINGS) ×4 IMPLANT
BRUSH SCRUB EZ PLAIN DRY (MISCELLANEOUS) ×4 IMPLANT
CHLORAPREP W/TINT 26ML (MISCELLANEOUS) ×1 IMPLANT
CORDS BIPOLAR (ELECTRODE) ×2 IMPLANT
COVER BACK TABLE 60X90IN (DRAPES) ×1 IMPLANT
COVER MAYO STAND STRL (DRAPES) ×1 IMPLANT
COVER SURGICAL LIGHT HANDLE (MISCELLANEOUS) ×2 IMPLANT
CUFF TOURNIQUET SINGLE 18IN (TOURNIQUET CUFF) IMPLANT
DRAIN PENROSE 1/2X12 LTX STRL (WOUND CARE) ×2 IMPLANT
DRAPE HALF SHEET 40X57 (DRAPES) ×1 IMPLANT
DRAPE SURG 17X23 STRL (DRAPES) ×1 IMPLANT
DRSG EMULSION OIL 3X3 NADH (GAUZE/BANDAGES/DRESSINGS) ×1 IMPLANT
GAUZE SPONGE 4X4 12PLY STRL (GAUZE/BANDAGES/DRESSINGS) ×3 IMPLANT
GAUZE XEROFORM 5X9 LF (GAUZE/BANDAGES/DRESSINGS) ×2 IMPLANT
GLOVE BIO SURGEON STRL SZ 6 (GLOVE) ×2 IMPLANT
GLOVE BIO SURGEON STRL SZ7.5 (GLOVE) ×5 IMPLANT
GLOVE BIOGEL PI IND STRL 7.0 (GLOVE) ×1 IMPLANT
GLOVE BIOGEL PI IND STRL 8 (GLOVE) ×1 IMPLANT
GLOVE BIOGEL PI INDICATOR 7.0 (GLOVE) ×2
GLOVE BIOGEL PI INDICATOR 8 (GLOVE) ×2
GLOVE ECLIPSE 6.5 STRL STRAW (GLOVE) ×1 IMPLANT
GLOVE SS N UNI LF 7.0 STRL (GLOVE) ×2 IMPLANT
GOWN STRL REUS W/ TWL LRG LVL3 (GOWN DISPOSABLE) ×2 IMPLANT
GOWN STRL REUS W/TWL LRG LVL3 (GOWN DISPOSABLE) ×6
GOWN STRL REUS W/TWL XL LVL3 (GOWN DISPOSABLE) ×3 IMPLANT
NDL HYPO 25X1 1.5 SAFETY (NEEDLE) IMPLANT
NEEDLE HYPO 25X1 1.5 SAFETY (NEEDLE) IMPLANT
NS IRRIG 1000ML POUR BTL (IV SOLUTION) ×3 IMPLANT
PACK BASIN DAY SURGERY FS (CUSTOM PROCEDURE TRAY) ×3 IMPLANT
PACK ORTHO EXTREMITY (CUSTOM PROCEDURE TRAY) ×2 IMPLANT
PAD CAST 4YDX4 CTTN HI CHSV (CAST SUPPLIES) IMPLANT
PADDING CAST ABS 4INX4YD NS (CAST SUPPLIES)
PADDING CAST ABS COTTON 4X4 ST (CAST SUPPLIES) IMPLANT
PADDING CAST COTTON 4X4 STRL (CAST SUPPLIES) ×3
RUBBERBAND STERILE (MISCELLANEOUS) IMPLANT
SCRUB POVIDONE IODINE 4 OZ (MISCELLANEOUS) ×2 IMPLANT
SET CYSTO W/LG BORE CLAMP LF (SET/KITS/TRAYS/PACK) ×2 IMPLANT
SET IRRIG Y TYPE TUR BLADDER L (SET/KITS/TRAYS/PACK) IMPLANT
SOL PREP POV-IOD 4OZ 10% (MISCELLANEOUS) ×2 IMPLANT
SPLINT PLASTER CAST XFAST 4X15 (CAST SUPPLIES) IMPLANT
SPLINT PLASTER XTRA FAST SET 4 (CAST SUPPLIES) ×2
STOCKINETTE 6  STRL (DRAPES)
STOCKINETTE 6 STRL (DRAPES) ×1 IMPLANT
SUT MNCRL AB 4-0 PS2 18 (SUTURE) ×2 IMPLANT
SUT PROLENE 0 SH 30 (SUTURE) ×4 IMPLANT
SUT VICRYL RAPIDE 4-0 (SUTURE) IMPLANT
SUT VICRYL RAPIDE 4/0 PS 2 (SUTURE) IMPLANT
SYR 10ML LL (SYRINGE) IMPLANT
SYR BULB 3OZ (MISCELLANEOUS) ×1 IMPLANT
TOWEL OR 17X24 6PK STRL BLUE (TOWEL DISPOSABLE) ×1 IMPLANT
TOWEL OR NON WOVEN STRL DISP B (DISPOSABLE) ×1 IMPLANT
UNDERPAD 30X30 (UNDERPADS AND DIAPERS) ×3 IMPLANT

## 2017-06-10 NOTE — ED Triage Notes (Signed)
[facility] to left arm and left ankle. Pt reports he goes every 3 months to have wounds checked and today they sent him to hospital for IV antibiotics. Pt has new swelling in left hand that started last week.

## 2017-06-10 NOTE — ED Notes (Signed)
Contacted admitting doctor regarding patient's ciwa score and possibly needing ativan.  Patient will be going to floor soon and doctor will come see him then to talk more about his alcohol use and if he needs any ativan at that time.

## 2017-06-10 NOTE — ED Notes (Signed)
Unsuccessful attempt at giving report to [facility].  Charge nurse on 5N stated that she  could not take report since she was giving pain meds to a patient, ending a blood transfusion, and busy being charge of floor.

## 2017-06-10 NOTE — Transfer of Care (Signed)
Immediate Anesthesia Transfer of Care Note  Patient: Melvin [name]  Procedure(s) Performed: IRRIGATION AND DEBRIDEMENT EXTREMITY, left wrist (Left Wrist)  Patient Location: PACU  Anesthesia Type:General  Level of Consciousness: awake and alert   Airway & Oxygen Therapy: Patient Spontanous Breathing  Post-op Assessment: Report given to RN and Post -op Vital signs reviewed and stable  Post vital signs: Reviewed  Last Vitals:  Vitals:   06/10/17 1600 06/10/17 1630  BP: 137/84 (!) 144/90  Pulse: (!) 115 (!) 111  Resp:    Temp:    SpO2: 96% 100%    Last Pain:  Vitals:   06/10/17 1309  TempSrc: Rectal  PainSc:          Complications: No apparent anesthesia complications

## 2017-06-10 NOTE — H&P (Signed)
Date: 06/10/2017               Patient Name:  Melvin [name] MRN: [id number]  DOB: [birth year] Age / Sex: 57 y.o., male   PCP: Arnoldo Morale, MD         Medical Service: Internal Medicine Teaching Service         Attending Physician: Dr. Annia Belt, MD    First Contact: Dr. Frederico Hamman  Pager: [phone]  Second Contact: Dr. Hetty Ely  Pager: [phone]       After Hours (After 5p/  First Contact Pager: [phone]  weekends / holidays): Second Contact Pager: [phone]   Chief Complaint: Drainage of L wrist ulcer   History of Present Illness:  Melvin [name] is a 57 yo male with history of uncontrolled insulin-dependent H0WC complicated by diabetic neuropathy, HTN, HLD, alcohol and cocaine use disorder, and pancreatic insufficiency secondary to chronic alcoholic pancreatitis who presents to the ED with drainage from L wrist ulcer. This ulcer has been present for 1 month. He has noticed purulent drainage for about 2-3 weeks and made an appointment with his PCP today for evaluation. Per PCP note, L wrist ulcer started as a blister that eventually popped and has been having purulent discharge since then. He was sent to the ED from PCP's office. He also has an ulcer in the L ankle ulcer that previously healed but has been worsening for the past 1.5-2 months. Per daughter, he has very poor sensation over extremities and he is unable to tell when he develops wounds. He endorses feeling cold and chills, but denies fever and night sweats. Reports compliance with insulin and oral DM medications but A1c 10 from 8.7.   ED course: Patient was afebrile and tachycardic with HR 108 on arrival to the ED. Labwork remarkable for Na 130, Cr 1.44, T. Bili 2.1, Hgb 11.9 with MCV 95.8. No leukocytosis and LA 0.77. L wrist XR showed erosion of distal ulna consistent with osteomyelitis. He was started on Vancomycin and Zosyn and received 1L NS bolus.   Review of Systems: A complete ROS was negative except as per  HPI.   Meds:  Current Meds  Medication Sig  . aspirin 81 MG EC tablet Take 1 tablet (81 mg total) by mouth daily.  Marland Kitchen atorvastatin (LIPITOR) 10 MG tablet TAKE 1 TABLET EVERY DAY AT 6  P.M. (Patient taking differently: Take 10 mg once a day at 6 PM)  . gabapentin (NEURONTIN) 300 MG capsule Take 2 capsules (600 mg total) by mouth 2 (two) times daily.  . insulin aspart (NOVOLOG) 100 UNIT/ML injection 150-200, give 3 units 201-250 give 5 units 251-300 give 7 units 301-350 give 9 units 351-400 give 12 units (Patient taking differently: Inject 3-12 Units into the skin See admin instructions. 3-12 units three times a day before meals PER SLIDING SCALE: BGL 150-200 = 3 units; 201-250 = 5 units; 251-300 = 7 units; 301-350 = 9 units; 351-400 = 12 units)  . insulin glargine (LANTUS) 100 UNIT/ML injection Inject 0.32 mLs (32 Units total) into the skin at bedtime.  . naproxen (NAPROSYN) 500 MG tablet TAKE 1 TABLET (500 MG TOTAL) BY MOUTH 2 (TWO) TIMES DAILY WITH A MEAL. (Patient taking differently: Take 500 mg by mouth 2 (two) times daily as needed (for pain). )  . Pancrelipase, Lip-Prot-Amyl, (ZENPEP) 20000-63000 units CPEP Take 20,000 Units by mouth 3 (three) times daily. (Patient taking differently: Take 20,000 Units by mouth 3 (three) times  daily before meals. )  . silver sulfADIAZINE (SILVADENE) 1 % cream Apply 1 application topically daily. (Patient taking differently: Apply 1 application topically See admin instructions. 1 application to the left hand two times a day)     Allergies: Allergies as of 06/10/2017  . (No Known Allergies)   Past Medical History:  Diagnosis Date  . Anemia   . Angina   . CHF (congestive heart failure) ([place])   . Diabetes mellitus   . GERD (gastroesophageal reflux disease)   . Hyperlipidemia   . Meningitis   . Neuromuscular disorder ([place])    diabetic neuropathy  . Pancreatitis   . Pneumonia     Family History:  Family History  Problem Relation Age of Onset  .  Heart failure Mother   . Diabetes Mother   . Hypertension Mother   . Heart disease Father   . Heart failure Father   . Colon cancer Neg Hx   . Colon polyps Neg Hx   . Esophageal cancer Neg Hx   . Rectal cancer Neg Hx   . Stomach cancer Neg Hx      Social History:  Drinks two 12 oz. beers every day. Denied tobacco and drug use.   Physical Exam: Blood pressure (!) 122/109, pulse (!) 123, temperature 99.6 F (37.6 C), temperature source Rectal, resp. rate 20, height 5\' 8"  (1.727 m), weight 163 lb (73.9 kg), SpO2 100 %.  General: male who appears chronically ill,. Lying in bed in no acute distress  Cardiac: regular rate and rhythm, nl S1/S2, no murmurs, rubs or gallops  Pulm: CTAB, no wheezes or crackles, no increased work of breathing  Abd: soft, NTND, bowel sounds present Neuro: A&Ox3, able to move all 4 extremities, decreased sensation over all 4 extremities from diabetic neuropathy  Ext: warm and well perfused, no peripheral edema, 2+ DP pulses bilaterally. L 4th and 5th digits with decreased ROM  Derm: there is a large ulcer on ulnar aspect of L wrist with active purulent drainage (see picture). There is also an ulcer superior to L ankle with no drainage and not associated with erythema or swelling.    EKG: none obtained   L wrist XR: IMPRESSION: Soft tissue wound with underlying erosion of the distal ulna consistent with osteomyelitis.  LLE XR: IMPRESSION: Lower leg soft tissue wound without evidence of active osteomyelitis or other acute osseous abnormality.  Assessment & Plan by Problem:  Juanda Bond. [name] is a 57 yo male with history of uncontrolled insulin-dependent R1VQ complicated by diabetic neuropathy, HTN, HLD, alcohol and cocaine use disorder, and pancreatic insufficiency secondary to chronic alcoholic pancreatitis who presents to the ED with drainage from L wrist ulcer and found to have osteomyelitis of L ulna on imaging.   # Osteomyelitis of L distal ulna: L  wrist ulcer present for 1 month with purulent discharge x 2 weeks (pictures on media tab). Fourth and fifth digit with decreased range of motion on exam. Ulnar osteo seen on imaging. Ortho consulted. Afebrile and hemodynamically stable. No leukocytosis.  - Continue vancomycin.  - Switch zosyn to cefepime in the setting of AKI  - Ortho following, appreciate recommendation  - OR today for debridement and further wound evaluation  - Consider ID consult  - Wound care center follow up after d/c    # LLE ulcer: Present for ~ 2 months. No signs of infection on exam. Pictures available in chart.  - Continue to monitor - Consider WOC consult   #  AKI: Cr 1.44 on admission. Bun normal. Cr normal at baseline. Could be secondary to dehydration in the setting of hyperglycemia and poor PO intake as albumin 2.6. Will hydrate and recheck.  - s/p 1L NS bolus  -  BMP in AM   # Alcohol use disorder: Drinks 2 12 oz beers every day. No history of withdrawal. Patient tremulous when seen in the ED, but reported this was due to feeling cold. Tachycardic. BP normal at the time. This could also be secondary to active infection. Will place on CIWA and continue to monitor. He also a history of cocaine use. BAL and UDS ordered.  - CIWA protocol, no Ativan ordered  - F/u BAL and UDS    # T4HD: Complicated by severe peripheral neuropathy.  A1c 10 on 06/10/2017 from 8.7 02/13/2017. Supposed to be on Lantus 40U but only using 34U QHS. Also on NovoLog sliding scale.  Daughter states BG's 300-400 at home for the past week. - Lantus 16U QHS  - M-SSI + CBG monitoring - Continue home gabapentin 600 mg QD   # HTN: Per chart, not on any antihypertensives at home.  No documentation of HTN on PCP's notes.  - We will continue to monitor  # HLD:  - Continue home Lipitor 10 mg QD   # Pancreatic insufficiency: Per PCP's note, this is an early alcoholic pancreatitis and he has intermittent diarrhea from this.  On Creon at home, dose  increased recently by PCP. - Continue home Creon   F: none  E: replete prn  N: CM diet   VTE ppx: going to OR today --> SQ Lovenox   Code status: Full code, confirmed on admission   Dispo: Admit patient to Inpatient with expected length of stay greater than 2 midnights.  SignedWelford Roche, MD 06/10/2017, 4:11 PM  Pager: [phone]

## 2017-06-10 NOTE — Op Note (Addendum)
06/10/2017  7:00 PM  PATIENT:  Melvin [name]  57 y.o. male  PRE-OPERATIVE DIAGNOSIS:  Deep infection left upper extremity  POST-OPERATIVE DIAGNOSIS:  Same  PROCEDURE:   1.  Left wrist arthrotomy with irrigation for infection    2.  Excisional debridement of left dorsal sixth compartment, including ECU tendon/muscle    3.  Partial excision of left distal ulna  SURGEON: Rayvon Char. Grandville Silos, MD  PHYSICIAN ASSISTANT: none  ANESTHESIA:  general  SPECIMENS:  To microbiology  DRAINS:  Penrose 1  EBL:  less than 50 mL  PREOPERATIVE INDICATIONS:  UTHMAN [name] is a  57 y.o. male with history of left ulnar-sided wrist abscess that has ulcerated.  The risks benefits and alternatives were discussed with the patient preoperatively including but not limited to the risks of infection, bleeding, nerve injury, cardiopulmonary complications, the need for revision surgery, among others, and the patient verbalized understanding and consented to proceed.  OPERATIVE IMPLANTS: none  OPERATIVE PROCEDURE:  The patient was escorted to the operative theatre and placed in a supine position.  A surgical "time-out" was performed during which the planned procedure, proposed operative site, and the correct patient identity were compared to the operative consent and agreement confirmed by the circulating nurse according to current facility policy.  Following application of a tourniquet to the operative extremity, the exposed skin was pre-scrubbed with a Hibiclens scrub brush before being prepped with Betadine.  The limb was then draped in the usual sterile fashion.  The limb was exsanguinated with gravity and the tourniquet inflated to approximately 170mmHg higher than systolic BP.  First, cultures were obtained from deep purulent material that was expressed by direct pressure proximal to the ulcer.  It was sent for aerobic, anaerobic, and fungal cultures.  The ulcer margins were excised sharply with a  scalpel and scissors, through all layers of the skin and subcutaneous tissues.  The chronically infected soft tissues were also excised with a combination of scissors, knife, and Roger excisional debridement.  The ulcer was extended proximally and distally along the ulnar subcutaneous border, and the ECU was found to be necrotic.  The contents of the 6th compartment were excised.  The volar compartment was opened as there appeared to be process extending into it.  A portion of the FCU muscle and the adjacent digital flexors were pale, and some of it was excised back to more reddened muscle that responded to the Bovie.  The ulnar neurovascular bundle was protected.  Attention was shifted back to the level of the wrist, where crumbled pieces of the ulnar styloid were excised.  Adjacent and proximal to this, the ulna was exposed and portions of it excised with a ronjeur.  After the partial ulna resection had been performed, there was enough soft tissues counting the remnants of the 6 compartment and the volar tissues of the ulna to cover the exposed bone if pulled together with sutures.  There was already a natural arthrotomy at the dorsal aspect of the DRUJ, and I created an ulnar-sided arthrotomy into the radiocarpal joint.  Through this, irrigant was withdrawn until clear, entering it at this site and coming out to the DRUJ arthrotomy.  The chondral surfaces it could be seen were actually in pretty good condition.  The remainder of the wound was then copiously irrigated with 3 L of irrigant after the soft tissue debridement of been performed.  The tourniquet was released, some additional hemostasis obtained, and then the incisional portions of  the wound were closed with 0 Prolene vertical mattress sutures, placing a Penrose deep to the proximal closure and extending it out to the open wound.  The area where the initial ulcer had been could not be closed primarily, but 4-0 monocryl was placed to provide ST coverage  over the exposed bone a couple spanning sutures were placed into it to reduce the size the defect.  A dressing was applied with a volar plaster component and he was awakened and taken to the recovery room in stable condition, breathing spontaneously.  DISPOSITION: He will be returned to the floor for continued diabetic an infection care.  He would benefit from infectious disease consultation and management of his deep infection with obvious extension into the distal ulna, as it would likely require multiple weeks of antibiotic therapy.  He would also benefit from ongoing continued wound care at the wound care center for his upper and lower extremity wounds.  I will plan to have the drain pulled on Friday morning and begin upper extremity wound care at that time.  I will write for these orders.

## 2017-06-10 NOTE — ED Notes (Signed)
Unsuccessful attempt at giving report to 5N. 

## 2017-06-10 NOTE — Progress Notes (Signed)
Subjective:  Patient ID: Melvin [name], male    DOB: [birth year]  Age: 57 y.o. MRN: [id number]  CC: Diabetes   HPI Melvin [name] is a 57 year old male with history of type 2 diabetes mellitus (A1c 10.0), diabetic neuropathy, chronic alcoholic pancreatitis, hyperlipidemia, hypertension who presents today for follow-up of his diabetes.  His A1c is 10.1 and has trended up from 8.7 previously; he endorses taking 24 units of Lantus rather than 30 units which he was discharged on at his last office visit. Continues to suffer from diabetic neuropathy and is on gabapentin.  He complains of a one-month history of left forearm ulcer which started out as a blister which popped and progressed to discharging pus with a foul odor and now his left forearm is also swollen.  He denies pain as he suffers from severe diabetic neuropathy which causes loss of sensation in his extremities. Also complains of a left ankle ulcer with purulent drainage for the last 2 weeks. Denies history of trauma. He never sought medical attention and denies fevers.  He does have pancreatic insufficiency from chronic alcoholic pancreatitis and does have intermittent diarrhea; his dose of Creon was increased at his last office visit and he is unable to tell if his symptoms have improved.  Past Medical History:  Diagnosis Date  . Anemia   . Angina   . CHF (congestive heart failure) ([place])   . Diabetes mellitus   . GERD (gastroesophageal reflux disease)   . Hyperlipidemia   . Meningitis   . Neuromuscular disorder ([place])    diabetic neuropathy  . Pancreatitis   . Pneumonia     Past Surgical History:  Procedure Laterality Date  . ERCP  08/02/2011   Procedure: ENDOSCOPIC RETROGRADE CHOLANGIOPANCREATOGRAPHY (ERCP);  Surgeon: Beryle Beams, MD;  Location: [facility] ENDOSCOPY;  Service: Endoscopy;  Laterality: N/A;  . ERCP  08/22/2011   Procedure: ENDOSCOPIC RETROGRADE CHOLANGIOPANCREATOGRAPHY (ERCP);  Surgeon: Beryle Beams, MD;  Location: [facility] ENDOSCOPY;  Service: Endoscopy;  Laterality: N/A;  . TONSILLECTOMY AND ADENOIDECTOMY     "as a kid"  . TYMPANOSTOMY TUBE PLACEMENT      No Known Allergies   Outpatient Medications Prior to Visit  Medication Sig Dispense Refill  . ACCU-CHEK FASTCLIX LANCETS MISC Use as directed for 3 times daily testing of blood glucose. E11.9 100 each 12  . ACCU-CHEK SOFTCLIX LANCETS lancets Use as instructed for 3 times daily testing of blood sugar. E11.9 100 each 12  . Alcohol Swabs (B-D SINGLE USE SWABS REGULAR) PADS USE AS DIRECTED THREE TIMES DAILY  AND AT BEDTIME 400 each 3  . aspirin 81 MG EC tablet Take 1 tablet (81 mg total) by mouth daily. 30 tablet 3  . atorvastatin (LIPITOR) 10 MG tablet TAKE 1 TABLET EVERY DAY AT 6  P.M. 90 tablet 0  . Blood Glucose Monitoring Suppl (ACCU-CHEK NANO SMARTVIEW) w/Device KIT Use as directed for 3 times daily testing of blood glucose. 1 kit 0  . glucose blood (ACCU-CHEK SMARTVIEW) test strip Use as instructed for 3 times daily testing of blood glucose. E11.9 100 each 12  . Insulin Syringe-Needle U-100 (B-D INS SYRINGE 0.5CC/31GX5/16) 31G X 5/16" 0.5 ML MISC USE TO INJECT INSULIN 3 TIMES A DAY AND AT BEDTIME 360 each 3  . Lancet Devices (ACCU-CHEK SOFTCLIX) lancets Use as instructed for 3 times daily testing of blood sugar. E11.9 1 each 0  . naproxen (NAPROSYN) 500 MG tablet TAKE 1 TABLET (500 MG TOTAL) BY  MOUTH 2 (TWO) TIMES DAILY WITH A MEAL. 60 tablet 0  . silver sulfADIAZINE (SILVADENE) 1 % cream Apply 1 application topically daily. 50 g 1  . gabapentin (NEURONTIN) 300 MG capsule Take 2 capsules (600 mg total) by mouth 2 (two) times daily. 120 capsule 3  . insulin aspart (NOVOLOG) 100 UNIT/ML injection 150-200, give 3 units 201-250 give 5 units 251-300 give 7 units 301-350 give 9 units 351-400 give 12 units 3 vial 0  . insulin glargine (LANTUS) 100 UNIT/ML injection Inject 0.3 mLs (30 Units total) into the skin at bedtime. 30 mL 3  .  ZENPEP 20000-63000 units CPEP TAKE 1 CAPSULE THREE TIMES DAILY BEFORE MEALS 100 capsule 0   No facility-administered medications prior to visit.     ROS Review of Systems  Constitutional: Negative for activity change and appetite change.  HENT: Negative for sinus pressure and sore throat.   Eyes: Negative for visual disturbance.  Respiratory: Negative for cough, chest tightness and shortness of breath.   Cardiovascular: Negative for chest pain and leg swelling.  Gastrointestinal: Negative for abdominal distention, abdominal pain, constipation and diarrhea.  Endocrine: Negative.   Genitourinary: Negative for dysuria.  Musculoskeletal:       See hpi  Skin: Positive for wound. Negative for rash.  Allergic/Immunologic: Negative.   Neurological: Positive for numbness. Negative for weakness and light-headedness.  Psychiatric/Behavioral: Negative for dysphoric mood and suicidal ideas.    Objective:  BP 99/60   Pulse (!) 108   Temp 98.6 F (37 C) (Oral)   Ht 5' 8"  (1.727 m)   Wt 163 lb (73.9 kg)   SpO2 98%   BMI 24.78 kg/m   BP/Weight 06/10/2017 2/70/6237 11/08/8313  Systolic BP 99 176 160  Diastolic BP 60 77 72  Wt. (Lbs) 163 168 168  BMI 24.78 25.54 25.54     Physical Exam  Constitutional: He is oriented to person, place, and time. He appears well-developed and well-nourished.  Cardiovascular: Normal rate, normal heart sounds and intact distal pulses.  No murmur heard. Pulmonary/Chest: Effort normal and breath sounds normal. He has no wheezes. He has no rales. He exhibits no tenderness.  Abdominal: Soft. Bowel sounds are normal. He exhibits no distension and no mass. There is no tenderness.  Musculoskeletal: He exhibits edema (left forearm edema).  Ulcer on dorsal aspect of left forearm with significant purulent discharge which is foul smelling, surrounding hyper pigmentation and edema. Lateral aspect of lower left leg with large ulcer and purulent discharge.  No edema noted.    Neurological: He is alert and oriented to person, place, and time.  Psychiatric: He has a normal mood and affect.    CMP Latest Ref Rng & Units 02/13/2017 11/14/2016 06/20/2016  Glucose 65 - 99 mg/dL 170(H) 215(H) 213(H)  BUN 6 - 24 mg/dL 11 9 13   Creatinine 0.76 - 1.27 mg/dL 0.91 1.19 1.24  Sodium 134 - 144 mmol/L 137 137 135  Potassium 3.5 - 5.2 mmol/L 3.9 3.9 3.8  Chloride 96 - 106 mmol/L 101 104 108  CO2 20 - 29 mmol/L 21 20 21(L)  Calcium 8.7 - 10.2 mg/dL 9.6 8.6(L) 8.8(L)  Total Protein 6.0 - 8.5 g/dL 7.5 6.9 6.0(L)  Total Bilirubin 0.0 - 1.2 mg/dL 0.4 1.3(H) 0.8  Alkaline Phos 39 - 117 IU/L 98 144(H) 55  AST 0 - 40 IU/L 18 153(H) 15  ALT 0 - 44 IU/L 10 89(H) 13(L)    Lipid Panel     Component Value Date/Time  CHOL 138 11/14/2016 0842   TRIG 58 11/14/2016 0842   HDL 104 11/14/2016 0842   CHOLHDL 1.3 11/14/2016 0842   CHOLHDL 1.7 10/25/2014 0250   VLDL 13 10/25/2014 0250   LDLCALC 22 11/14/2016 0842    Lab Results  Component Value Date   HGBA1C 10.0 06/10/2017     Assessment & Plan:   1. Diabetes mellitus due to underlying condition with hyperosmolarity without coma, with long-term current use of insulin (HCC) Controlled with A1c of 10.0 Increase Lantus to 32 units Compliance with diabetic diet and medications have been emphasized - POCT glucose (manual entry) - POCT glycosylated hemoglobin (Hb A1C)  2. Type 2 diabetes mellitus with other neurologic complication, with long-term current use of insulin (HCC) Uncontrolled Currently on gabapentin We will discuss the need to switch to Lyrica if symptoms persist - insulin glargine (LANTUS) 100 UNIT/ML injection; Inject 0.32 mLs (32 Units total) into the skin at bedtime.  Dispense: 30 mL; Refill: 3 - insulin aspart (NOVOLOG) 100 UNIT/ML injection; 150-200, give 3 units 201-250 give 5 units 251-300 give 7 units 301-350 give 9 units 351-400 give 12 units  Dispense: 3 vial; Refill: 3 - gabapentin (NEURONTIN) 300 MG  capsule; Take 2 capsules (600 mg total) by mouth 2 (two) times daily.  Dispense: 120 capsule; Refill: 3  3. Pancreatic insufficiency Uncontrolled despite increasing dose of Creon Will refer to GI - Pancrelipase, Lip-Prot-Amyl, (ZENPEP) 20000-63000 units CPEP; Take 20,000 Units by mouth 3 (three) times daily.  Dispense: 90 capsule; Refill: 3 - Ambulatory referral to Gastroenterology  4. Abscess Left forearm and left leg abscesses He will need incision and drainage and also MRI to exclude underlying osteomyelitis I have referred him to the ED STAT His poorly controlled diabetes puts him at risk of amputation and I have discussed this with him   Meds ordered this encounter  Medications  . insulin glargine (LANTUS) 100 UNIT/ML injection    Sig: Inject 0.32 mLs (32 Units total) into the skin at bedtime.    Dispense:  30 mL    Refill:  3    Discontinue previous dose  . insulin aspart (NOVOLOG) 100 UNIT/ML injection    Sig: 150-200, give 3 units 201-250 give 5 units 251-300 give 7 units 301-350 give 9 units 351-400 give 12 units    Dispense:  3 vial    Refill:  3  . Pancrelipase, Lip-Prot-Amyl, (ZENPEP) 20000-63000 units CPEP    Sig: Take 20,000 Units by mouth 3 (three) times daily.    Dispense:  90 capsule    Refill:  3  . gabapentin (NEURONTIN) 300 MG capsule    Sig: Take 2 capsules (600 mg total) by mouth 2 (two) times daily.    Dispense:  120 capsule    Refill:  3    Follow-up: Return in about 3 weeks (around 07/01/2017) for abscess.   Arnoldo Morale MD

## 2017-06-10 NOTE — ED Notes (Signed)
Patient transported to X-ray 

## 2017-06-10 NOTE — ED Notes (Signed)
Unsuccessful attempt at giving report.  

## 2017-06-10 NOTE — ED Notes (Signed)
ED charge nurse notified of why patient still not being able to go to 5N.

## 2017-06-10 NOTE — Progress Notes (Addendum)
Pharmacy Antibiotic Note  Melvin [name] is a 57 y.o. male admitted on 06/10/2017 with cellulitis in L hand. L leg abscess also noted in previous notes. SCr up 0.9 > 1.4.  Plan: -Vancomycin 1500 mg IV x1 then 750 mg IV q12h -Monitor renal fx, cultures, VT as needed -Cefepime 1g/12h   Height: 5\' 8"  (172.7 cm) Weight: 163 lb (73.9 kg) IBW/kg (Calculated) : 68.4  Temp (24hrs), Avg:98.9 F (37.2 C), Min:98.6 F (37 C), Max:99.6 F (37.6 C)  Recent Labs  Lab 06/10/17 1106 06/10/17 1219 06/10/17 1326  WBC 7.0  --   --   CREATININE 1.44*  --   --   LATICACIDVEN  --  1.47 0.77    Estimated Creatinine Clearance: 54.1 mL/min (A) (by C-G formula based on SCr of 1.44 mg/dL (H)).     Antimicrobials this admission: 1/2 vancomycin > 1/2 zosyn x1 1/2 cefepime >  Dose adjustments this admission: N/A  Microbiology results: 1/2 blood cx:   Harvel Quale 06/10/2017 1:30 PM

## 2017-06-10 NOTE — ED Notes (Signed)
Unable to give report to 5N.  Room has not been assigned to a nurse yet.

## 2017-06-10 NOTE — ED Provider Notes (Signed)
[facility] EMERGENCY DEPARTMENT Provider Note   CSN: [id number] Arrival date & time: 06/10/17  1018     History   Chief Complaint Chief Complaint  Patient presents with  . Wound Infection    HPI Melvin [name] is a 57 y.o. male.  HPI Melvin [name] is a 57 y.o. male with history of poorly controlled diabetes, CHF, diabetic neuropathy, presents to ED with complaint of draining ulcers. Pt with a draining wound to the left wrist and left lower leg. States wrist ulcer started about a month ago. States "few days ago scab came off and it started to drain." states ulcer on the leg started about 2 days ago.  Denies history of the same.  Denies pain.  States that his left arm started to swell, so he went to a primary care doctor who sent him here.  Denies any known fever or chills.  He has no other complaints.  Denies any injuries.  Past Medical History:  Diagnosis Date  . Anemia   . Angina   . CHF (congestive heart failure) ([place])   . Diabetes mellitus   . GERD (gastroesophageal reflux disease)   . Hyperlipidemia   . Meningitis   . Neuromuscular disorder ([place])    diabetic neuropathy  . Pancreatitis   . Pneumonia     Patient Active Problem List   Diagnosis Date Noted  . Abscess 06/10/2017  . Pancreatic insufficiency 02/13/2017  . AKI (acute kidney injury) ([place]) 06/18/2016  . Diabetes mellitus without complication ([place]) 45/62/5638  . Chronic diastolic CHF (congestive heart failure) ([place]) 06/18/2016  . Diarrhea 06/18/2016  . Unilateral primary osteoarthritis, left knee 04/23/2016  . Hyperlipidemia 11/14/2014  . Erectile dysfunction 11/14/2014  . Diabetic neuropathy ([place]) 10/31/2014  . Hypoglycemia associated with diabetes ([place])   . Alcoholism ([place])   . Cocaine abuse ([place])   . Blurry vision, bilateral   . HSV-1 (herpes simplex virus 1) infection   . HSV-2 (herpes simplex virus 2) infection   . Congestive heart disease ([place])   . Essential  hypertension   . DM w/o complication type II ([place])   . Hypothermia 10/24/2014  . Near syncope   . Weakness   . Dyspnea on exertion   . Palpitation   . Alcohol abuse   . Genital HSV   . ETOH abuse 07/28/2011  . Pulmonary nodule 05/30/2011  . Pancreatitis 05/28/2011  . Diabetes ([place]) 05/28/2011    Past Surgical History:  Procedure Laterality Date  . ERCP  08/02/2011   Procedure: ENDOSCOPIC RETROGRADE CHOLANGIOPANCREATOGRAPHY (ERCP);  Surgeon: Beryle Beams, MD;  Location: [facility] ENDOSCOPY;  Service: Endoscopy;  Laterality: N/A;  . ERCP  08/22/2011   Procedure: ENDOSCOPIC RETROGRADE CHOLANGIOPANCREATOGRAPHY (ERCP);  Surgeon: Beryle Beams, MD;  Location: [facility] ENDOSCOPY;  Service: Endoscopy;  Laterality: N/A;  . TONSILLECTOMY AND ADENOIDECTOMY     "as a kid"  . TYMPANOSTOMY TUBE PLACEMENT         Home Medications    Prior to Admission medications   Medication Sig Start Date End Date Taking? Authorizing Provider  ACCU-CHEK FASTCLIX LANCETS MISC Use as directed for 3 times daily testing of blood glucose. E11.9 10/09/16   Arnoldo Morale, MD  ACCU-CHEK SOFTCLIX LANCETS lancets Use as instructed for 3 times daily testing of blood sugar. E11.9 09/23/16   Arnoldo Morale, MD  Alcohol Swabs (B-D SINGLE USE SWABS REGULAR) PADS USE AS DIRECTED THREE TIMES DAILY  AND AT BEDTIME 12/30/16   Amao, Charlane Ferretti,  MD  aspirin 81 MG EC tablet Take 1 tablet (81 mg total) by mouth daily. 11/14/14   Arnoldo Morale, MD  atorvastatin (LIPITOR) 10 MG tablet TAKE 1 TABLET EVERY DAY AT 6  P.M. 05/26/17   Arnoldo Morale, MD  Blood Glucose Monitoring Suppl (ACCU-CHEK NANO SMARTVIEW) w/Device KIT Use as directed for 3 times daily testing of blood glucose. 10/09/16   Arnoldo Morale, MD  gabapentin (NEURONTIN) 300 MG capsule Take 2 capsules (600 mg total) by mouth 2 (two) times daily. 06/10/17   Arnoldo Morale, MD  glucose blood (ACCU-CHEK SMARTVIEW) test strip Use as instructed for 3 times daily testing of blood glucose. E11.9 10/09/16    Arnoldo Morale, MD  insulin aspart (NOVOLOG) 100 UNIT/ML injection 150-200, give 3 units 201-250 give 5 units 251-300 give 7 units 301-350 give 9 units 351-400 give 12 units 06/10/17   Arnoldo Morale, MD  insulin glargine (LANTUS) 100 UNIT/ML injection Inject 0.32 mLs (32 Units total) into the skin at bedtime. 06/10/17   Arnoldo Morale, MD  Insulin Syringe-Needle U-100 (B-D INS SYRINGE 0.5CC/31GX5/16) 31G X 5/16" 0.5 ML MISC USE TO INJECT INSULIN 3 TIMES A DAY AND AT BEDTIME 10/27/16   Arnoldo Morale, MD  Lancet Devices [facility]) lancets Use as instructed for 3 times daily testing of blood sugar. E11.9 09/23/16   Arnoldo Morale, MD  naproxen (NAPROSYN) 500 MG tablet TAKE 1 TABLET (500 MG TOTAL) BY MOUTH 2 (TWO) TIMES DAILY WITH A MEAL. 04/28/17   Arnoldo Morale, MD  Pancrelipase, Lip-Prot-Amyl, (ZENPEP) 20000-63000 units CPEP Take 20,000 Units by mouth 3 (three) times daily. 06/10/17   Arnoldo Morale, MD  silver sulfADIAZINE (SILVADENE) 1 % cream Apply 1 application topically daily. 11/11/16   Arnoldo Morale, MD    Family History Family History  Problem Relation Age of Onset  . Heart failure Mother   . Diabetes Mother   . Hypertension Mother   . Heart disease Father   . Heart failure Father   . Colon cancer Neg Hx   . Colon polyps Neg Hx   . Esophageal cancer Neg Hx   . Rectal cancer Neg Hx   . Stomach cancer Neg Hx     Social History Social History   Tobacco Use  . Smoking status: Never Smoker  . Smokeless tobacco: Never Used  Substance Use Topics  . Alcohol use: Yes    Alcohol/week: 4.2 oz    Types: 3 Cans of beer, 4 Shots of liquor per week    Comment: couple times weekly  . Drug use: Yes    Types: Cocaine    Comment: pt reports last drug use X1 year ago      Allergies   Patient has no known allergies.   Review of Systems Review of Systems  Constitutional: Negative for chills and fever.  Respiratory: Negative for cough, chest tightness and shortness of breath.     Cardiovascular: Negative for chest pain, palpitations and leg swelling.  Gastrointestinal: Negative for abdominal distention, abdominal pain, diarrhea, nausea and vomiting.  Genitourinary: Negative for dysuria, frequency and hematuria.  Musculoskeletal: Positive for arthralgias, joint swelling and myalgias. Negative for neck pain and neck stiffness.  Skin: Negative for rash.  Allergic/Immunologic: Negative for immunocompromised state.  Neurological: Negative for dizziness, weakness, light-headedness, numbness and headaches.  All other systems reviewed and are negative.    Physical Exam Updated Vital Signs BP 109/81 (BP Location: Right Arm)   Pulse (!) 112   Temp 98.6 F (37 C) (Oral)  Resp 20   Ht _0  (1.727 m)   Wt 73.9 kg (163 lb)   SpO2 100%   BMI 24.78 kg/m   Physical Exam  Constitutional: He appears well-developed and well-nourished. No distress.  HENT:  Head: Normocephalic and atraumatic.  Eyes: Conjunctivae are normal.  Neck: Neck supple.  Cardiovascular: Normal rate, regular rhythm and normal heart sounds.  Pulmonary/Chest: Effort normal. No respiratory distress. He has no wheezes. He has no rales.  Abdominal: Soft. Bowel sounds are normal. He exhibits no distension. There is no tenderness. There is no rebound.  Musculoskeletal: He exhibits no edema.  3 x 3 cm open wound to the left dorsal wrist, over distal ulna, with purulent malodorous drainage.  There is surrounding erythema extending into the dorsal hand and all the way up to the elbow.  There is induration of the skin, warmth, consistent with cellulitis.  Hand is pink and warm, capillary refill less than 2 seconds.  Radial pulses intact.  There is a small, 2 x 2 centimeter ulcer to the left lower lateral shin with mild surrounding erythema  And small drainage  Neurological: He is alert.  Skin: Skin is warm and dry.  Nursing note and vitals reviewed.    ED Treatments / Results  Labs (all labs ordered are  listed, but only abnormal results are displayed) Labs Reviewed  COMPREHENSIVE METABOLIC PANEL - Abnormal; Notable for the following components:      Result Value   Sodium 130 (*)    Chloride 100 (*)    Glucose, Bld 190 (*)    Creatinine, Ser 1.44 (*)    Calcium 8.6 (*)    Albumin 2.6 (*)    ALT 15 (*)    Total Bilirubin 2.1 (*)    GFR calc non Af Amer 52 (*)    All other components within normal limits  CBC WITH DIFFERENTIAL/PLATELET - Abnormal; Notable for the following components:   RBC 3.60 (*)    Hemoglobin 11.9 (*)    HCT 34.5 (*)    Monocytes Absolute 1.3 (*)    All other components within normal limits  CULTURE, BLOOD (ROUTINE X 2)  CULTURE, BLOOD (ROUTINE X 2)  URINALYSIS, ROUTINE W REFLEX MICROSCOPIC  I-STAT CG4 LACTIC ACID, ED  I-STAT CG4 LACTIC ACID, ED    EKG  EKG Interpretation None       Radiology No results found.  Procedures Procedures (including critical care time)  Medications Ordered in ED Medications  piperacillin-tazobactam (ZOSYN) IVPB 3.375 g (not administered)     Initial Impression / Assessment and Plan / ED Course  I have reviewed the triage vital signs and the nursing notes.  Pertinent labs & imaging results that were available during my care of the patient were reviewed by me and considered in my medical decision making (see chart for details).     Patient with a large wound to the left wrist that is draining, malodorous, with extensive cellulitis over the forearm.  Will get x-rays to evaluate for osteomyelitis.  Patient is tachycardic, however is afebrile, normal blood pressure, normal respiratory rate, normal oxygen saturation, does not meet Sirs criteria at this time, will obtain blood cultures and start IV fluids and antibiotics.  Vancomycin and Zosyn ordered for abscess/cellulitis.  1:30 PM Patient's x-rays concerning for osteomyelitis.  I will admit for IV antibiotics and further evaluation. Spoke with teaching service, will  admit.   Vitals:   06/10/17 1245 06/10/17 1309 06/10/17 1500 06/10/17 1533  BP:  122/87  (!) 159/100 (!) 122/109  Pulse: (!) 105  (!) 112 (!) 123  Resp: 20     Temp:  99.6 F (37.6 C)    TempSrc:  Rectal    SpO2: 100%  100%   Weight:      Height:         Final Clinical Impressions(s) / ED Diagnoses   Final diagnoses:  Other acute osteomyelitis of left ulna (HCC)  Cellulitis of left forearm  Skin ulcer of left lower leg, limited to breakdown of skin [facility])    ED Discharge Orders    None       Jeannett Senior, PA-C 06/10/17 1641    Dorie Rank, MD 06/10/17 1657

## 2017-06-10 NOTE — Anesthesia Procedure Notes (Signed)
Procedure Name: Intubation Date/Time: 06/10/2017 6:07 PM Performed by: Eligha Bridegroom, CRNA Pre-anesthesia Checklist: Patient identified, Emergency Drugs available, Suction available, Patient being monitored and Timeout performed Patient Re-evaluated:Patient Re-evaluated prior to induction Oxygen Delivery Method: Circle system utilized Preoxygenation: Pre-oxygenation with 100% oxygen Induction Type: IV induction Laryngoscope Size: Mac Grade View: Grade I Tube type: Oral Tube size: 7.5 mm Number of attempts: 1 Airway Equipment and Method: Stylet Placement Confirmation: ETT inserted through vocal cords under direct vision,  positive ETCO2 and breath sounds checked- equal and bilateral Secured at: 22 cm Tube secured with: Tape Dental Injury: Teeth and Oropharynx as per pre-operative assessment

## 2017-06-10 NOTE — Consult Note (Signed)
Reason for Consult:Left wrist osteo Referring Physician: Hikaru Delorenzo is an 56 y.o. male with poorly controlled DM. HPI: Melvin [name] came in with an ulceration of his left wrist that's been present for about a month. He said he's been trying to get in to see his PCP but has been unable 2/2 holidays. He went today and they referred him to the ED. It has waxed and waned in severity with various characters of discharge and odor. Little to no pain but he is quite neuropathic. No prior history of ulcer here. His blood sugars can run quite high at home, 300's to 400's per dtr. He also has an ulceration on his lateral left leg that's been present since before Thanksgiving. This one has healed but has opened up again. It hasn't really had a problem with drainage. He is RHD. He is not the best historian.  Past Medical History:  Diagnosis Date  . Anemia   . Angina   . CHF (congestive heart failure) ([place])   . Diabetes mellitus   . GERD (gastroesophageal reflux disease)   . Hyperlipidemia   . Meningitis   . Neuromuscular disorder ([place])    diabetic neuropathy  . Pancreatitis   . Pneumonia     Past Surgical History:  Procedure Laterality Date  . ERCP  08/02/2011   Procedure: ENDOSCOPIC RETROGRADE CHOLANGIOPANCREATOGRAPHY (ERCP);  Surgeon: Beryle Beams, MD;  Location: [facility] ENDOSCOPY;  Service: Endoscopy;  Laterality: N/A;  . ERCP  08/22/2011   Procedure: ENDOSCOPIC RETROGRADE CHOLANGIOPANCREATOGRAPHY (ERCP);  Surgeon: Beryle Beams, MD;  Location: [facility] ENDOSCOPY;  Service: Endoscopy;  Laterality: N/A;  . TONSILLECTOMY AND ADENOIDECTOMY     "as a kid"  . TYMPANOSTOMY TUBE PLACEMENT      Family History  Problem Relation Age of Onset  . Heart failure Mother   . Diabetes Mother   . Hypertension Mother   . Heart disease Father   . Heart failure Father   . Colon cancer Neg Hx   . Colon polyps Neg Hx   . Esophageal cancer Neg Hx   . Rectal cancer Neg Hx   . Stomach cancer Neg Hx      Social History:  reports that  has never smoked. he has never used smokeless tobacco. He reports that he drinks about 4.2 oz of alcohol per week. He reports that he uses drugs. Drug: Cocaine.  Allergies: No Known Allergies  Medications: I have reviewed the patient's current medications.  Results for orders placed or performed during the hospital encounter of 06/10/17 (from the past 48 hour(s))  Urinalysis, Routine w reflex microscopic     Status: Abnormal   Collection Time: 06/10/17 10:55 AM  Result Value Ref Range   Color, Urine AMBER (A) YELLOW    Comment: BIOCHEMICALS MAY BE AFFECTED BY COLOR   APPearance HAZY (A) CLEAR   Specific Gravity, Urine 1.020 1.005 - 1.030   pH 5.0 5.0 - 8.0   Glucose, UA 50 (A) NEGATIVE mg/dL   Hgb urine dipstick MODERATE (A) NEGATIVE   Bilirubin Urine NEGATIVE NEGATIVE   Ketones, ur 5 (A) NEGATIVE mg/dL   Protein, ur 100 (A) NEGATIVE mg/dL   Nitrite NEGATIVE NEGATIVE   Leukocytes, UA NEGATIVE NEGATIVE   RBC / HPF 6-30 0 - 5 RBC/hpf   WBC, UA 0-5 0 - 5 WBC/hpf   Bacteria, UA RARE (A) NONE SEEN   Squamous Epithelial / LPF 0-5 (A) NONE SEEN   Mucus PRESENT  Hyaline Casts, UA PRESENT   Comprehensive metabolic panel     Status: Abnormal   Collection Time: 06/10/17 11:06 AM  Result Value Ref Range   Sodium 130 (L) 135 - 145 mmol/L   Potassium 3.9 3.5 - 5.1 mmol/L   Chloride 100 (L) 101 - 111 mmol/L   CO2 23 22 - 32 mmol/L   Glucose, Bld 190 (H) 65 - 99 mg/dL   BUN 12 6 - 20 mg/dL   Creatinine, Ser 1.44 (H) 0.61 - 1.24 mg/dL   Calcium 8.6 (L) 8.9 - 10.3 mg/dL   Total Protein 7.6 6.5 - 8.1 g/dL   Albumin 2.6 (L) 3.5 - 5.0 g/dL   AST 21 15 - 41 U/L   ALT 15 (L) 17 - 63 U/L   Alkaline Phosphatase 82 38 - 126 U/L   Total Bilirubin 2.1 (H) 0.3 - 1.2 mg/dL   GFR calc non Af Amer 52 (L) >60 mL/min   GFR calc Af Amer >60 >60 mL/min    Comment: (NOTE) The eGFR has been calculated using the CKD EPI equation. This calculation has not been  validated in all clinical situations. eGFR's persistently <60 mL/min signify possible Chronic Kidney Disease.    Anion gap 7 5 - 15  CBC with Differential     Status: Abnormal   Collection Time: 06/10/17 11:06 AM  Result Value Ref Range   WBC 7.0 4.0 - 10.5 K/uL   RBC 3.60 (L) 4.22 - 5.81 MIL/uL   Hemoglobin 11.9 (L) 13.0 - 17.0 g/dL   HCT 34.5 (L) 39.0 - 52.0 %   MCV 95.8 78.0 - 100.0 fL   MCH 33.1 26.0 - 34.0 pg   MCHC 34.5 30.0 - 36.0 g/dL   RDW 14.2 11.5 - 15.5 %   Platelets 217 150 - 400 K/uL   Neutrophils Relative % 70 %   Neutro Abs 4.9 1.7 - 7.7 K/uL   Lymphocytes Relative 11 %   Lymphs Abs 0.8 0.7 - 4.0 K/uL   Monocytes Relative 18 %   Monocytes Absolute 1.3 (H) 0.1 - 1.0 K/uL   Eosinophils Relative 1 %   Eosinophils Absolute 0.0 0.0 - 0.7 K/uL   Basophils Relative 0 %   Basophils Absolute 0.0 0.0 - 0.1 K/uL  I-Stat CG4 Lactic Acid, ED     Status: None   Collection Time: 06/10/17 12:19 PM  Result Value Ref Range   Lactic Acid, Venous 1.47 0.5 - 1.9 mmol/L  I-Stat CG4 Lactic Acid, ED     Status: None   Collection Time: 06/10/17  1:26 PM  Result Value Ref Range   Lactic Acid, Venous 0.77 0.5 - 1.9 mmol/L    Dg Wrist Complete Left  Result Date: 06/10/2017 CLINICAL DATA:  Cellulitis.  Draining wound.  Initial encounter. EXAM: LEFT WRIST - COMPLETE 3+ VIEW COMPARISON:  None. FINDINGS: There is osseous erosion involving the distal ulna, particularly the styloid process. There is overlying soft tissue swelling and irregularity consistent with the history of a wound. No dissecting soft tissue emphysema is identified. No acute fracture or dislocation is seen. Joint space widths are preserved. IMPRESSION: Soft tissue wound with underlying erosion of the distal ulna consistent with osteomyelitis. Electronically Signed   By: Logan Bores M.D.   On: 06/10/2017 13:10    Review of Systems  Constitutional: Negative for chills, fever and weight loss.  HENT: Negative for ear  discharge, ear pain, hearing loss and tinnitus.   Eyes: Negative for blurred vision,  double vision, photophobia and pain.  Respiratory: Negative for cough, sputum production and shortness of breath.   Cardiovascular: Negative for chest pain.  Gastrointestinal: Negative for abdominal pain, nausea and vomiting.  Genitourinary: Negative for dysuria, flank pain, frequency and urgency.  Musculoskeletal: Negative for back pain, falls, joint pain, myalgias and neck pain.  Neurological: Negative for dizziness, tingling, sensory change, focal weakness, loss of consciousness and headaches.  Endo/Heme/Allergies: Does not bruise/bleed easily.  Psychiatric/Behavioral: Negative for depression, memory loss and substance abuse. The patient is not nervous/anxious.    Blood pressure 122/87, pulse (!) 105, temperature 99.6 F (37.6 C), temperature source Rectal, resp. rate 20, height 5' 8"  (1.727 m), weight 73.9 kg (163 lb), SpO2 100 %. Physical Exam  Constitutional: He appears well-developed and well-nourished. No distress.  HENT:  Head: Normocephalic.  Eyes: Conjunctivae are normal. Right eye exhibits no discharge. Left eye exhibits no discharge. No scleral icterus.  Neck: Normal range of motion.  Cardiovascular: Normal rate and regular rhythm.  Respiratory: Effort normal. No respiratory distress.  Musculoskeletal:  Left shoulder, elbow, wrist, digits- Large ulceration with surrounding granulation and exudative center, malodorous, nontender, no instability, no blocks to motion  Sens  Ax/R/M/U paresthetic  Mot   Ax/ R/ PIN/ M/ AIN/ U intact but little finger extension, ulnar deviation of wrist weak  Rad 2+, Uln 2+  LLE Large ulceration lateral lower leg well proximal of ankle, no ecchymosis or rash  Nontender  No knee or ankle effusion  Knee stable to varus/ valgus and anterior/posterior stress  Sens DPN, SPN, TN nearly absent  Motor EHL, ext, flex, evers 5/5  Neurological: He is alert.  Skin: Skin  is warm and dry. He is not diaphoretic.  Psychiatric: He has a normal mood and affect. His behavior is normal.        Assessment/Plan: Left wrist ulceration with osteo of distal ulna -- Dr. Grandville Silos to I&D this afternoon, please keep NPO. Will need f/u with Wound Center at discharge. Would benefit from ID consult. Left lower leg ulceration -- X-rays pending but doubt fibular involvement, WOC consult. DM -- IM to admit, gain better control of CBG's    Lisette Abu, PA-C Orthopedic Surgery [phone] 06/10/2017, 2:40 PM    Hand Surgery Attending:  L distal forearm ulcer with drainage.  To OR for debridement and deeper evaluation of the extent of the infection.  Will need ID consult/care and close wound center f/u after d/c from hospital.  G/R/O reviewed with patient and consent obtained.  Micheline Rough, MD Hand Surgery

## 2017-06-10 NOTE — Anesthesia Preprocedure Evaluation (Addendum)
Anesthesia Evaluation  Patient identified by MRN, date of birth, ID band Patient awake    Reviewed: Allergy & Precautions, NPO status , Patient's Chart, lab work & pertinent test results  Airway Mallampati: II  TM Distance: >3 FB Neck ROM: Full    Dental  (+) Dental Advisory Given   Pulmonary neg pulmonary ROS,    breath sounds clear to auscultation       Cardiovascular hypertension, +CHF   Rhythm:Regular Rate:Normal     Neuro/Psych  Neuromuscular disease    GI/Hepatic Neg liver ROS, GERD  ,  Endo/Other  diabetes, Insulin Dependent  Renal/GU Renal InsufficiencyRenal disease     Musculoskeletal   Abdominal   Peds  Hematology  (+) anemia ,   Anesthesia Other Findings   Reproductive/Obstetrics                            Lab Results  Component Value Date   WBC 7.0 06/10/2017   HGB 11.9 (L) 06/10/2017   HCT 34.5 (L) 06/10/2017   MCV 95.8 06/10/2017   PLT 217 06/10/2017   Lab Results  Component Value Date   CREATININE 1.44 (H) 06/10/2017   BUN 12 06/10/2017   NA 130 (L) 06/10/2017   K 3.9 06/10/2017   CL 100 (L) 06/10/2017   CO2 23 06/10/2017    Anesthesia Physical Anesthesia Plan  ASA: III  Anesthesia Plan: General   Post-op Pain Management:    Induction: Intravenous  PONV Risk Score and Plan: 2 and Ondansetron, Treatment may vary due to age or medical condition and Dexamethasone  Airway Management Planned: Oral ETT  Additional Equipment:   Intra-op Plan:   Post-operative Plan: Extubation in OR  Informed Consent: I have reviewed the patients History and Physical, chart, labs and discussed the procedure including the risks, benefits and alternatives for the proposed anesthesia with the patient or authorized representative who has indicated his/her understanding and acceptance.   Dental advisory given  Plan Discussed with: CRNA  Anesthesia Plan Comments:         Anesthesia Quick Evaluation

## 2017-06-11 ENCOUNTER — Other Ambulatory Visit: Payer: Self-pay

## 2017-06-11 ENCOUNTER — Encounter (HOSPITAL_COMMUNITY): Payer: Self-pay

## 2017-06-11 ENCOUNTER — Inpatient Hospital Stay (HOSPITAL_COMMUNITY): Payer: Medicare HMO

## 2017-06-11 DIAGNOSIS — E11622 Type 2 diabetes mellitus with other skin ulcer: Secondary | ICD-10-CM

## 2017-06-11 DIAGNOSIS — F1410 Cocaine abuse, uncomplicated: Secondary | ICD-10-CM

## 2017-06-11 DIAGNOSIS — Z8249 Family history of ischemic heart disease and other diseases of the circulatory system: Secondary | ICD-10-CM

## 2017-06-11 DIAGNOSIS — F1010 Alcohol abuse, uncomplicated: Secondary | ICD-10-CM

## 2017-06-11 DIAGNOSIS — L98499 Non-pressure chronic ulcer of skin of other sites with unspecified severity: Secondary | ICD-10-CM

## 2017-06-11 DIAGNOSIS — E1142 Type 2 diabetes mellitus with diabetic polyneuropathy: Secondary | ICD-10-CM

## 2017-06-11 DIAGNOSIS — L97929 Non-pressure chronic ulcer of unspecified part of left lower leg with unspecified severity: Secondary | ICD-10-CM

## 2017-06-11 DIAGNOSIS — L97329 Non-pressure chronic ulcer of left ankle with unspecified severity: Secondary | ICD-10-CM

## 2017-06-11 DIAGNOSIS — Z833 Family history of diabetes mellitus: Secondary | ICD-10-CM

## 2017-06-11 DIAGNOSIS — L03114 Cellulitis of left upper limb: Secondary | ICD-10-CM | POA: Insufficient documentation

## 2017-06-11 DIAGNOSIS — K8689 Other specified diseases of pancreas: Secondary | ICD-10-CM

## 2017-06-11 DIAGNOSIS — N182 Chronic kidney disease, stage 2 (mild): Secondary | ICD-10-CM

## 2017-06-11 DIAGNOSIS — I129 Hypertensive chronic kidney disease with stage 1 through stage 4 chronic kidney disease, or unspecified chronic kidney disease: Secondary | ICD-10-CM

## 2017-06-11 DIAGNOSIS — E1122 Type 2 diabetes mellitus with diabetic chronic kidney disease: Secondary | ICD-10-CM

## 2017-06-11 LAB — GLUCOSE, CAPILLARY
GLUCOSE-CAPILLARY: 258 mg/dL — AB (ref 65–99)
GLUCOSE-CAPILLARY: 299 mg/dL — AB (ref 65–99)
Glucose-Capillary: 180 mg/dL — ABNORMAL HIGH (ref 65–99)
Glucose-Capillary: 328 mg/dL — ABNORMAL HIGH (ref 65–99)

## 2017-06-11 LAB — CBC
HCT: 28.8 % — ABNORMAL LOW (ref 39.0–52.0)
HEMOGLOBIN: 9.7 g/dL — AB (ref 13.0–17.0)
MCH: 32.3 pg (ref 26.0–34.0)
MCHC: 33.7 g/dL (ref 30.0–36.0)
MCV: 96 fL (ref 78.0–100.0)
Platelets: 164 10*3/uL (ref 150–400)
RBC: 3 MIL/uL — AB (ref 4.22–5.81)
RDW: 14.2 % (ref 11.5–15.5)
WBC: 6.4 10*3/uL (ref 4.0–10.5)

## 2017-06-11 LAB — BASIC METABOLIC PANEL
ANION GAP: 6 (ref 5–15)
BUN: 12 mg/dL (ref 6–20)
CALCIUM: 7.8 mg/dL — AB (ref 8.9–10.3)
CO2: 23 mmol/L (ref 22–32)
Chloride: 98 mmol/L — ABNORMAL LOW (ref 101–111)
Creatinine, Ser: 1.31 mg/dL — ABNORMAL HIGH (ref 0.61–1.24)
GFR, EST NON AFRICAN AMERICAN: 58 mL/min — AB (ref >60)
GLUCOSE: 274 mg/dL — AB (ref 65–99)
Potassium: 3.7 mmol/L (ref 3.5–5.1)
SODIUM: 127 mmol/L — AB (ref 135–145)

## 2017-06-11 LAB — HIV ANTIBODY (ROUTINE TESTING W REFLEX): HIV SCREEN 4TH GENERATION: NONREACTIVE

## 2017-06-11 MED ORDER — BACITRACIN ZINC 500 UNIT/GM EX OINT
TOPICAL_OINTMENT | Freq: Two times a day (BID) | CUTANEOUS | Status: DC
  Administered 2017-06-12 – 2017-06-15 (×6): via TOPICAL
  Filled 2017-06-11 (×2): qty 28.35

## 2017-06-11 MED ORDER — ACETAMINOPHEN 325 MG PO TABS
650.0000 mg | ORAL_TABLET | Freq: Four times a day (QID) | ORAL | Status: DC | PRN
  Administered 2017-06-11: 650 mg via ORAL
  Filled 2017-06-11: qty 2

## 2017-06-11 MED ORDER — SODIUM CHLORIDE 0.9 % IV SOLN
INTRAVENOUS | Status: DC
  Administered 2017-06-11 – 2017-06-12 (×2): via INTRAVENOUS

## 2017-06-11 MED ORDER — HYDROMORPHONE HCL 1 MG/ML IJ SOLN
0.5000 mg | INTRAMUSCULAR | Status: DC | PRN
  Filled 2017-06-11: qty 1

## 2017-06-11 NOTE — Consult Note (Signed)
[facility] Nurse wound consult note Reason for Consult: full thickness LLE ulcer Wound type: neuropathic Pressure Injury POA: NA Measurement: 3.5cm x 1.8cm x 0.2cm Wound bed: 100% pink Drainage (amount, consistency, odor) scant serous exudate, no odor Periwound: darkened skin, dry Dressing procedure/placement/frequency: I have provided nurses with orders for NS moistened to dry dressing changes twice daily, wrapping with kling, no tape on skin. Patient states these ulcers just appear, decreased sensation prevents him from even knowing they are present. Educated on importance of inspecting skin routinely for such ulcers. Patient already has had surgery to clean up the ulcer on his left arm, that dressing is dry and intact.  We will not follow, but will remain available to this patient, to nursing, and the medical and/or surgical teams.  Please re-consult if we need to assist further.   Fara Olden, RN-C, WTA-C Wound Treatment Associate

## 2017-06-11 NOTE — Progress Notes (Signed)
Pt's left lower leg ulcer cleaned and dressed per wound care orders by RN. Pt tolerated well. No drainage noted, no foul odor.

## 2017-06-11 NOTE — Progress Notes (Signed)
NT checked pt's vitals and temp was 100.5, BP 76/42 and BS was 258, pt very lethargic seeming. RN went and rechecked vitals with a temp or 99.7 and manual BP of 96/54. Pt easily aroused but then falls right back asleep. Alerted MD of what was going on.

## 2017-06-11 NOTE — Consult Note (Signed)
Wound consult noted for arm wound.  Dr. Grandville Silos (Ortho surgeon) last note states he will care for arm wound on Friday and he will write orders for wound care at that time.  Fara Olden, RN-C, WTA-C Wound Treatment Associate

## 2017-06-11 NOTE — Progress Notes (Signed)
Subjective:  No acute events overnight. Went for debridement yesterday and tolerated procedure well. Reports pain at surgical site but otherwise doing well. Eating breakfast when seen this AM. Discuss plan to continue broad spectrum antibiotic pending wound cx results. Voiced understanding and in agreement with plan.   Objective:  Vital signs in last 24 hours: Vitals:   06/10/17 1945 06/10/17 2107 06/11/17 0022 06/11/17 0431  BP: (!) 146/89 132/82 (!) 155/101 108/72  Pulse:  99 (!) 108 (!) 116  Resp:      Temp: 98.7 F (37.1 C) 98.6 F (37 C) 99.5 F (37.5 C) 100.3 F (37.9 C)  TempSrc:  Oral Oral Oral  SpO2:  100% 98% 97%  Weight:      Height:       General: very pleasant male, well-nourished, well-developed, in no acute distress  Cardiac: regular rate and rhythm, nl S1/S2, no murmurs, rubs or gallops  Pulm: CTAB, no wheezes or crackles, no increased work of breathing  Abd: soft, NTND, bowel sounds present Neuro: A&Ox3, sensation markedly decreased on all extremities, no other deficits noted  Ext: warm and well perfused, no peripheral edema noted, L wrist ulcer bandaged   Assessment/Plan:  Active Problems:   Osteomyelitis (HCC)   Juanda Bond. Barnhill is a 57 yo male with history of uncontrolled insulin-dependent V4QV complicated by diabetic neuropathy, HTN, HLD, alcohol and cocaine use disorder, and pancreatic insufficiency secondary to chronic alcoholic pancreatitis who presents to the ED with drainage from L wrist ulcer and found to have osteomyelitis of L ulna on imaging.   # Osteomyelitis of L distal ulna: L wrist ulcer present for 1 month with purulent discharge x 2 weeks (pictures on media tab). S/p debridement on 1/2. On broad-spectrum antibiotics.  Will require long term antibiotic therapy.  - Continue vancomycin and cefepime   - Ortho following, appreciate recommendation  - Consider ID consult for antibiotic therapy   - Wound care center follow up after d/c     # Fever: Paged by RN in the afternoon who reports patient febrile to 1 0.7 BP 96/54 P 90s an dCBG 258. Per RN report patient appeared tired and somnolent.  Currently on vancomycin and cefepime.  Blood cultures from admission negative at 24 hours.  We will repeat blood cultures and order chest x-ray.  Changes in antibiotic therapy at this time as he is on broad-spectrum. If worsens would add and help with coverage. - F/u CXR and blood cx   # LLE ulcer: Present for ~ 2 months. No signs of infection on exam. Pictures available in chart.  - Continue to monitor - WOC consult   # AKI: Cr 1.44 on admission. BUN normal. Cr normal at baseline. Could be secondary to dehydration in the setting of hyperglycemia and poor PO intake as albumin 2.6. Will hydrate and recheck. Cr improving, Cr 1.33 - Ns @ 125cc/hr  - Continue to monitor  -  BMP in AM   # Alcohol use disorder: Drinks 2 12 oz beers every day. No history of withdrawal. Patient tremulous when seen in the ED, but reported this was due to feeling cold. Tachycardic. BP normal at the time. This could also be secondary to active infection. Will place on CIWA and continue to monitor. He also a history of cocaine use. BAL and UDS ordered. BAL negative.  - CIWA protocol, no Ativan ordered  - F/u UDS    # Z5GL: Complicated by severe peripheral neuropathy.  A1c 10 on 06/10/2017  from 8.7 02/13/2017. Supposed to be on Lantus 40U but only using 34U QHS. Also on NovoLog sliding scale.  Daughter states BG's 300-400 at home for the past week. - Lantus 16U QHS  - M-SSI + CBG monitoring - Continue home gabapentin 600 mg QD   # HTN: Per chart, not on any antihypertensives at home.  No documentation of HTN on PCP's notes.  - We will continue to monitor  # HLD:  - Continue home Lipitor 10 mg QD   # Pancreatic insufficiency: Per PCP's note, this is an early alcoholic pancreatitis and he has intermittent diarrhea from this.  On Creon at home, dose increased  recently by PCP. - Continue home Creon    Dispo: Anticipated discharge in approximately 1-3 day(s) pending infectious workup and wound cx for adjustment of antibiotic therapy.   Welford Roche, MD 06/11/2017, 10:48 AM Pager: [phone]

## 2017-06-11 NOTE — Progress Notes (Signed)
Inpatient Diabetes Program Recommendations  AACE/ADA: New Consensus Statement on Inpatient Glycemic Control (2015)  Target Ranges:  Prepandial:   less than 140 mg/dL      Peak postprandial:   less than 180 mg/dL (1-2 hours)      Critically ill patients:  140 - 180 mg/dL   Lab Results  Component Value Date   GLUCAP 328 (H) 06/11/2017   HGBA1C 9.0 (H) 06/10/2017   Review of Glycemic Control  Diabetes history: DM 2 Outpatient Diabetes medications: Lantus 32 units, Novolog 3-12 units tid Current orders for Inpatient glycemic control: Lantus 16 units (1/2 home dose) QHS, Novolog Moderate Correction 0-15 units tid   Inpatient Diabetes Program Recommendations:    Glucose 328. Lantus scheduled for patient to receive this evening. Based on patient trends consider increasing Lantus to 25 units, 80% of home dose.  Thanks,  Tama Headings RN, MSN, [facility] Inpatient Diabetes Coordinator Team Pager [phone] (8a-5p)

## 2017-06-12 DIAGNOSIS — L98499 Non-pressure chronic ulcer of skin of other sites with unspecified severity: Secondary | ICD-10-CM

## 2017-06-12 DIAGNOSIS — E119 Type 2 diabetes mellitus without complications: Secondary | ICD-10-CM

## 2017-06-12 DIAGNOSIS — Z794 Long term (current) use of insulin: Secondary | ICD-10-CM

## 2017-06-12 LAB — BASIC METABOLIC PANEL
Anion gap: 6 (ref 5–15)
BUN: 16 mg/dL (ref 6–20)
CALCIUM: 8.1 mg/dL — AB (ref 8.9–10.3)
CO2: 21 mmol/L — ABNORMAL LOW (ref 22–32)
Chloride: 102 mmol/L (ref 101–111)
Creatinine, Ser: 1.24 mg/dL (ref 0.61–1.24)
GFR calc Af Amer: >60 mL/min (ref >60)
GFR calc non Af Amer: >60 mL/min (ref >60)
Glucose, Bld: 439 mg/dL — ABNORMAL HIGH (ref 65–99)
POTASSIUM: 4.2 mmol/L (ref 3.5–5.1)
SODIUM: 129 mmol/L — AB (ref 135–145)

## 2017-06-12 LAB — GLUCOSE, CAPILLARY
Glucose-Capillary: 496 mg/dL — ABNORMAL HIGH (ref 65–99)
Glucose-Capillary: 149 mg/dL — ABNORMAL HIGH (ref 65–99)
Glucose-Capillary: 171 mg/dL — ABNORMAL HIGH (ref 65–99)

## 2017-06-12 LAB — GLUCOSE, RANDOM: GLUCOSE: 435 mg/dL — AB (ref 65–99)

## 2017-06-12 MED ORDER — INSULIN ASPART 100 UNIT/ML ~~LOC~~ SOLN
15.0000 [IU] | Freq: Once | SUBCUTANEOUS | Status: AC
  Administered 2017-06-12: 15 [IU] via SUBCUTANEOUS

## 2017-06-12 MED ORDER — INSULIN GLARGINE 100 UNIT/ML ~~LOC~~ SOLN
20.0000 [IU] | Freq: Every day | SUBCUTANEOUS | Status: DC
  Administered 2017-06-12: 20 [IU] via SUBCUTANEOUS
  Filled 2017-06-12: qty 0.2

## 2017-06-12 MED ORDER — INSULIN ASPART 100 UNIT/ML ~~LOC~~ SOLN
5.0000 [IU] | Freq: Three times a day (TID) | SUBCUTANEOUS | Status: DC
  Administered 2017-06-12 – 2017-06-14 (×5): 5 [IU] via SUBCUTANEOUS

## 2017-06-12 NOTE — Progress Notes (Signed)
Hand Surgery Note--left forearm/wrist debridement 06-10-17 Tmax 101.5, Tc 99.6  Dressing/penrose drain removed, no expressible drainage, wound bed clean, moist granulation, sutures intact Full Digital ROM  Hand Surgery Plan:  LUE WOUND:  Begin daily wound care as ordered Wrist splint (already ordered) Needs f/u either with me or the wound center to sake of wound management.  Since he has multi-focal wounds and I am only presently caring for his L UE wound, he may benefit from consolidated wound care at the wound center rather than fragmented wound care.  If determined he will f/u with me for left arm wound, it should be in about 10 days.  From strictly hand surgery/wound perspective he could be discharged at any time. However, discharge is more related to his active diabetic and infection issues.  INFECTION: care per primary team/ID  Please call with any surgical-related issues  Micheline Rough, MD Hand Surgery Office [phone] Mobile [phone]

## 2017-06-12 NOTE — Progress Notes (Signed)
Subjective:  Hypotensive overnight. CXR negative for acute processes. Repeat blood cx pending. Now afebrile and normotensive this AM. Reported no complaints when seen during rounds. Discussed plan of narrowing antibiotic therapy once wound cx results available. Patient voiced understanding and is in agreement with plan. All questions answered.   Objective:  Vital signs in last 24 hours: Vitals:   06/11/17 1602 06/11/17 1738 06/11/17 2106 06/12/17 0554  BP: (!) 96/54 (!) 81/65 (!) 95/57 133/80  Pulse: 90 (!) 109 90 100  Resp: 19 18    Temp: 99.7 F (37.6 C) 98.3 F (36.8 C) 99.7 F (37.6 C) 99.6 F (37.6 C)  TempSrc: Oral Oral Oral Oral  SpO2: 100% 100% 100% 100%  Weight:      Height:       General: pleasant male, well-developed, well-nourished, lying in bed in no acute distress  Cardiac: regular rate and rhythm, nl S1/S2, no murmurs, rubs or gallops  Pulm: CTAB, no wheezes or crackles, no increased work of breathing  Abd: soft, NTND, bowel sounds present Neuro: A&Ox3, decreased sensation on all 4 extremities from diabetic neuropathy, otherwise no neuro deficits  Ext: warm and well perfused, no peripheral edema, surgical wound remains bandaged, L lateral shin ulcer stable without signs of infection   Assessment/Plan:  Melvin [name]. Force is a 57 yo male with history of uncontrolled insulin-dependent N9GX complicated by diabetic neuropathy, HTN, HLD, alcohol and cocaine use disorder,andpancreatic insufficiency secondary tochronicalcoholic pancreatitiswho presents to the ED with drainage fromLwrist ulcer and found to have osteomyelitis of L ulna on imaging.   # Osteomyelitis of L distal ulna:L wrist ulcer present for 1 month with purulent discharge x 2 weeks (pictures on media tab). S/p debridement on 1/2. On broad-spectrum antibiotics. Pending surgical wound cx for narrowing.Will require long term antibiotic therapy.  - Continue vancomycin and cefepime , pending wound cx    - Ortho following, appreciate recommendation. Ready for d/c from surgical standpoint  - Wound care center follow up after d/c  # Post-op Fever: Febrile to 101.7 BP 96/54 P 90s and CBG 258. He appeared tired and somnolent a the time.  Currently on vancomycin and cefepime.  Blood cultures from admission negative at 48 hours.  Repeat blood cx pending. CXR negative. Afebrile and hypotensive overnight. Now normotensive.  - F/u blood cx   # LLE ulcer:Present for ~ 2 months. No signs of infection on exam. Pictures available in chart.  - Continue to monitor - WOC consult  # AKI: Cr 1.44 on admission.BUN normal.Cr normal at baseline.Could be secondary to dehydration in the setting of hyperglycemia and poor PO intake as albumin 2.6. Will hydrate and recheck.Cr 1.24 this AM.  -NS @ 125cc/hr  - Continue to monitor   # Alcohol use disorder: Drinks 2 12 oz beers every day. No history of withdrawal. Patient tremulous when seen in the ED, but reported this was due to feeling cold. Tachycardic. BP normal at the time. This could also be secondary to active infection. Will place on CIWA and continue to monitor. He also a history of cocaine use. BAL negative.  - CIWA protocol, no Ativan ordered  - F/u UDS, not collected   # T2DM:Complicated by severe peripheral neuropathy.A1c 10 on 06/10/2017 from 8.7 02/13/2017. Supposed to be on Lantus 40U but only using 34U QHS.Also onNovoLog sliding scale. Daughter states BG's 300-400at home for the past week. BG remains uncontrolled and labile during admission. Adjustments as below.  -Lantus 16-->20U QHS. Will likely need further  increased.  - Add Novolog 5 TID with meals, to give if eating > 50% of meals   -M-SSI + CBG monitoring -Continue home gabapentin 600 mg QD   # HTN:Per chart, noton any antihypertensives at home.No documentation of HTN on PCP's notes. Hypotensive overnight, but currently normotensive.  -We will continue to monitor  #  HLD:  - Continue home Lipitor 10 mg QD   # Pancreatic insufficiency: Per PCP's note, this isan early alcoholic pancreatitis and he has intermittent diarrhea from this. On Creon at home,dose increased recently by PCP. - Continue home Creon   Dispo: Anticipated discharge in approximately 1-3 day(s) pending wound cx and adjustment of antibiotic therapy.   Welford Roche, MD 06/12/2017, 7:03 AM Pager: [phone]

## 2017-06-12 NOTE — Progress Notes (Signed)
Medicine attending: We appreciate orthopedic surgery follow-up. I examined this patient today together with resident physician Dr. Isac Sarna and I concur with her evaluation and management plan which we discussed together. Fever to 101.5 yesterday.  Patient just started on broad-spectrum parenteral antibiotics on admission the day before.  Transient fall in his blood pressure down to 76/42.  Currently recovered and normotensive.  Maximum temperature 99.7. Gram stain on material submitted from his debrided left breast ulcer with both gram-positive cocci in pairs and chains and gram-negative rods.  Blood cultures negative so far. Exam is stable.  Wound dressing not removed. Blood sugar still running high.  Recommendations on insulin made by diabetes coordinator. Impression: 1.  Diabetic ulcer skin left wrist with associated osteomyelitis of the ulnar bone. Awaiting final culture results and bacterial identification to determine type and extent of antibiotics. 2.  Diabetic ulcer left lateral ankle.  No associated osteomyelitis at this time.  Continue local wound care for both lesions. 3.  Poorly controlled diabetes.  Adjusting insulin.

## 2017-06-12 NOTE — Progress Notes (Signed)
Orthopedic Tech Progress Note Patient Details:  Melvin [name] 02/20/1961 [id number]  Ortho Devices Type of Ortho Device: Wrist splint       [place] Pink 06/12/2017, 10:38 AM

## 2017-06-12 NOTE — Progress Notes (Signed)
Inpatient Diabetes Program Recommendations  AACE/ADA: New Consensus Statement on Inpatient Glycemic Control (2015)  Target Ranges:  Prepandial:   less than 140 mg/dL      Peak postprandial:   less than 180 mg/dL (1-2 hours)      Critically ill patients:  140 - 180 mg/dL   Lab Results  Component Value Date   GLUCAP 299 (H) 06/11/2017   HGBA1C 9.0 (H) 06/10/2017   Review of Glycemic Control  Diabetes history: DM 2 Outpatient Diabetes medications: Lantus 32 units, Novolog 3-12 units tid Current orders for Inpatient glycemic control: Lantus 16 units (1/2 home dose) QHS, Novolog Moderate Correction 0-15 units tid   Inpatient Diabetes Program Recommendations:    Lab glucose this am indicating glucose 439 mg/dl at 0720. To be verified by fingerstick.  If accurate may need full dose of home basal insulin, Lantus 32 units.   Also consider adding Novolog 5 units tid meal coverage in addition to correction scale if patient is consuming at least 50% of meals.  Thanks,  Tama Headings RN, MSN, [facility] Inpatient Diabetes Coordinator Team Pager [phone] (8a-5p)

## 2017-06-13 DIAGNOSIS — E11622 Type 2 diabetes mellitus with other skin ulcer: Secondary | ICD-10-CM

## 2017-06-13 DIAGNOSIS — N179 Acute kidney failure, unspecified: Secondary | ICD-10-CM

## 2017-06-13 DIAGNOSIS — Z794 Long term (current) use of insulin: Secondary | ICD-10-CM

## 2017-06-13 DIAGNOSIS — M86132 Other acute osteomyelitis, left radius and ulna: Secondary | ICD-10-CM

## 2017-06-13 DIAGNOSIS — Z79899 Other long term (current) drug therapy: Secondary | ICD-10-CM

## 2017-06-13 DIAGNOSIS — F1490 Cocaine use, unspecified, uncomplicated: Secondary | ICD-10-CM

## 2017-06-13 DIAGNOSIS — K860 Alcohol-induced chronic pancreatitis: Secondary | ICD-10-CM

## 2017-06-13 DIAGNOSIS — F1099 Alcohol use, unspecified with unspecified alcohol-induced disorder: Secondary | ICD-10-CM

## 2017-06-13 DIAGNOSIS — I10 Essential (primary) hypertension: Secondary | ICD-10-CM

## 2017-06-13 DIAGNOSIS — R5082 Postprocedural fever: Secondary | ICD-10-CM

## 2017-06-13 DIAGNOSIS — E785 Hyperlipidemia, unspecified: Secondary | ICD-10-CM

## 2017-06-13 DIAGNOSIS — L97929 Non-pressure chronic ulcer of unspecified part of left lower leg with unspecified severity: Secondary | ICD-10-CM

## 2017-06-13 DIAGNOSIS — E1169 Type 2 diabetes mellitus with other specified complication: Principal | ICD-10-CM

## 2017-06-13 DIAGNOSIS — E1140 Type 2 diabetes mellitus with diabetic neuropathy, unspecified: Secondary | ICD-10-CM

## 2017-06-13 DIAGNOSIS — K8681 Exocrine pancreatic insufficiency: Secondary | ICD-10-CM

## 2017-06-13 LAB — GLUCOSE, CAPILLARY
GLUCOSE-CAPILLARY: 234 mg/dL — AB (ref 65–99)
GLUCOSE-CAPILLARY: 182 mg/dL — AB (ref 65–99)
Glucose-Capillary: 277 mg/dL — ABNORMAL HIGH (ref 65–99)
Glucose-Capillary: 106 mg/dL — ABNORMAL HIGH (ref 65–99)
Glucose-Capillary: 230 mg/dL — ABNORMAL HIGH (ref 65–99)

## 2017-06-13 LAB — CBC
HCT: 28.3 % — ABNORMAL LOW (ref 39.0–52.0)
HEMOGLOBIN: 9.4 g/dL — AB (ref 13.0–17.0)
MCH: 32.1 pg (ref 26.0–34.0)
MCHC: 33.2 g/dL (ref 30.0–36.0)
MCV: 96.6 fL (ref 78.0–100.0)
Platelets: 150 10*3/uL (ref 150–400)
RBC: 2.93 MIL/uL — ABNORMAL LOW (ref 4.22–5.81)
RDW: 13.8 % (ref 11.5–15.5)
WBC: 4.4 10*3/uL (ref 4.0–10.5)

## 2017-06-13 LAB — BASIC METABOLIC PANEL
ANION GAP: 6 (ref 5–15)
BUN: 13 mg/dL (ref 6–20)
CALCIUM: 8.1 mg/dL — AB (ref 8.9–10.3)
CHLORIDE: 104 mmol/L (ref 101–111)
CO2: 22 mmol/L (ref 22–32)
Creatinine, Ser: 1.09 mg/dL (ref 0.61–1.24)
GFR calc Af Amer: >60 mL/min (ref >60)
GFR calc non Af Amer: >60 mL/min (ref >60)
GLUCOSE: 318 mg/dL — AB (ref 65–99)
Potassium: 4.2 mmol/L (ref 3.5–5.1)
Sodium: 132 mmol/L — ABNORMAL LOW (ref 135–145)

## 2017-06-13 MED ORDER — DEXTROSE 5 % IV SOLN
2.0000 g | Freq: Two times a day (BID) | INTRAVENOUS | Status: DC
  Administered 2017-06-13: 2 g via INTRAVENOUS
  Filled 2017-06-13 (×2): qty 2

## 2017-06-13 MED ORDER — INFLUENZA VAC SPLIT QUAD 0.5 ML IM SUSY
0.5000 mL | PREFILLED_SYRINGE | INTRAMUSCULAR | Status: AC
  Administered 2017-06-14: 0.5 mL via INTRAMUSCULAR
  Filled 2017-06-13: qty 0.5

## 2017-06-13 MED ORDER — INSULIN GLARGINE 100 UNIT/ML ~~LOC~~ SOLN
7.0000 [IU] | Freq: Once | SUBCUTANEOUS | Status: AC
  Administered 2017-06-13: 7 [IU] via SUBCUTANEOUS
  Filled 2017-06-13: qty 0.07

## 2017-06-13 MED ORDER — INSULIN GLARGINE 100 UNIT/ML ~~LOC~~ SOLN
27.0000 [IU] | Freq: Every day | SUBCUTANEOUS | Status: DC
  Administered 2017-06-13: 27 [IU] via SUBCUTANEOUS
  Filled 2017-06-13: qty 0.27

## 2017-06-13 NOTE — Progress Notes (Signed)
Pharmacy Antibiotic Note  Melvin [name] is a 57 y.o. male admitted on 06/10/2017 with osteo.   Abx for osteo s/p debridement in OR. WBC 6.4, AF. Cx still pending with GNRs and GPC in pairs and chains.  Plan: Increase cefepime to 2g IV q12 Vancomycin 750 mg IV q12h Monitor renal fx, cultures, VT as needed  Consider stopping vanc soon if no staph grows?  Height: 5\' 8"  (172.7 cm) Weight: 163 lb (73.9 kg) IBW/kg (Calculated) : 68.4  Temp (24hrs), Avg:99.3 F (37.4 C), Min:99.1 F (37.3 C), Max:99.4 F (37.4 C)  Recent Labs  Lab 06/10/17 1106 06/10/17 1219 06/10/17 1326 06/11/17 0921 06/12/17 0720 06/13/17 0549  WBC 7.0  --   --  6.4  --  4.4  CREATININE 1.44*  --   --  1.31* 1.24 1.09  LATICACIDVEN  --  1.47 0.77  --   --   --     Estimated Creatinine Clearance: 71.5 mL/min (by C-G formula based on SCr of 1.09 mg/dL).      Melvin [name] 06/13/2017 11:29 AM

## 2017-06-13 NOTE — Progress Notes (Signed)
Internal Medicine Attending:   I saw and examined the patient. I reviewed Dr Fredrik Cove note and I agree with the resident's findings and plan as documented in the resident's note. We have consulted ID for Osteomyelitis Abx recommendations.  Appreciate Dr Comer's assistance, will plan to change to ceftriaxone and obtain PICC line.  Otherwise doing well, still hyperglycemic, agree with dr blum's plans with increasing lantus to 27 units.

## 2017-06-13 NOTE — Consult Note (Signed)
[place] for Infectious Disease       Reason for Consult: osteomyelitis    Referring Physician: Dr. Beryle Beams  Active Problems:   Osteomyelitis [facility])   Diabetic ulcer of ankle ([place])   Diabetic ulcer of left wrist ([place])   Diabetes mellitus type 2, insulin dependent ([place])   . atorvastatin  10 mg Oral q1800  . bacitracin   Topical BID  . Chlorhexidine Gluconate Cloth  6 each Topical Q0600  . gabapentin  600 mg Oral BID  . [START ON 06/14/2017] Influenza vac split quadrivalent PF  0.5 mL Intramuscular Tomorrow-1000  . insulin aspart  0-15 Units Subcutaneous TID WC  . insulin aspart  5 Units Subcutaneous TID WC  . insulin glargine  27 Units Subcutaneous QHS  . insulin glargine  7 Units Subcutaneous Once  . lipase/protease/amylase  24,000 Units Oral TID  . mupirocin ointment  1 application Nasal BID    Recommendations: Ceftriaxone pending sensitivities picc line for prolonged antibiotics   Assessment: He has osteomyelitis of wrist  S/p debridement including bone.  Culture with Strep viridans, sensitivities pending.    Antibiotics: Vancomycin and cefepime  HPI: Melvin [name] is a 57 y.o. male with poorly controlled diabetes with AIC of 9, reported substance abuse history who presented with his chronic ulcer with drainage, noted osteomyelitis on xray. Underwent excisional debridement by Dr. Grandville Silos and now culture with Strep viridans.  Patient with complaint of dressing not being changed in a timely manner.  No fever, no chills.  No diarrhea but has chronic tenesmus.   No associated rash.     Review of Systems:  Constitutional: negative for fevers and chills Respiratory: negative for cough Integument/breast: negative for rash All other systems reviewed and are negative    Past Medical History:  Diagnosis Date  . Anemia   . Angina   . CHF (congestive heart failure) ([place])   . Diabetes mellitus   . GERD (gastroesophageal reflux disease)   . Hyperlipidemia    . Meningitis   . Neuromuscular disorder ([place])    diabetic neuropathy  . Pancreatitis   . Pneumonia     Social History   Tobacco Use  . Smoking status: Never Smoker  . Smokeless tobacco: Never Used  Substance Use Topics  . Alcohol use: Yes    Alcohol/week: 4.2 oz    Types: 3 Cans of beer, 4 Shots of liquor per week    Comment: couple times weekly  . Drug use: Yes    Types: Cocaine    Comment: pt reports last drug use X1 year ago     Family History  Problem Relation Age of Onset  . Heart failure Mother   . Diabetes Mother   . Hypertension Mother   . Heart disease Father   . Heart failure Father   . Colon cancer Neg Hx   . Colon polyps Neg Hx   . Esophageal cancer Neg Hx   . Rectal cancer Neg Hx   . Stomach cancer Neg Hx     No Known Allergies  Physical Exam: Constitutional: in no apparent distress and alert  Vitals:   06/13/17 0504 06/13/17 1317  BP: 134/84 139/89  Pulse: 88 84  Resp:  18  Temp: 99.4 F (37.4 C) 98 F (36.7 C)  SpO2: 100% 100%   EYES: anicteric ENMT:no thrush Cardiovascular: Cor RRR Respiratory: CTA B; normal respiratory effort GI: Bowel sounds are normal, liver is not enlarged, spleen is not enlarged Musculoskeletal:  no pedal edema noted Skin: negatives: no rash Hematologic: no cervical lad  Lab Results  Component Value Date   WBC 4.4 06/13/2017   HGB 9.4 (L) 06/13/2017   HCT 28.3 (L) 06/13/2017   MCV 96.6 06/13/2017   PLT 150 06/13/2017    Lab Results  Component Value Date   CREATININE 1.09 06/13/2017   BUN 13 06/13/2017   NA 132 (L) 06/13/2017   K 4.2 06/13/2017   CL 104 06/13/2017   CO2 22 06/13/2017    Lab Results  Component Value Date   ALT 15 (L) 06/10/2017   AST 21 06/10/2017   GGT 375 (H) 02/13/2017   ALKPHOS 82 06/10/2017     Microbiology: Recent Results (from the past 240 hour(s))  Blood culture (routine x 2)     Status: None (Preliminary result)   Collection Time: 06/10/17  1:17 PM  Result Value Ref  Range Status   Specimen Description BLOOD RIGHT ANTECUBITAL  Final   Special Requests   Final    BOTTLES DRAWN AEROBIC AND ANAEROBIC Blood Culture adequate volume   Culture NO GROWTH 3 DAYS  Final   Report Status PENDING  Incomplete  Blood culture (routine x 2)     Status: None (Preliminary result)   Collection Time: 06/10/17  1:29 PM  Result Value Ref Range Status   Specimen Description BLOOD RIGHT WRIST  Final   Special Requests   Final    BOTTLES DRAWN AEROBIC AND ANAEROBIC Blood Culture adequate volume   Culture NO GROWTH 3 DAYS  Final   Report Status PENDING  Incomplete  MRSA PCR Screening     Status: Abnormal   Collection Time: 06/10/17  3:27 PM  Result Value Ref Range Status   MRSA by PCR POSITIVE (A) NEGATIVE Final    Comment:        The GeneXpert MRSA Assay (FDA approved for NASAL specimens only), is one component of a comprehensive MRSA colonization surveillance program. It is not intended to diagnose MRSA infection nor to guide or monitor treatment for MRSA infections. RESULT CALLED TO, READ BACK BY AND VERIFIED WITH: RN Marylouise Stacks [id number] MLM   Fungus Culture With Stain     Status: None (Preliminary result)   Collection Time: 06/10/17  6:14 PM  Result Value Ref Range Status   Fungus Stain Final report  Final    Comment: (NOTE) Performed At: [facility] [address], [place] [id number] Rush Farmer MD WG:[id number]    Fungus (Mycology) Culture PENDING  Incomplete   Fungal Source LEFT  Final    Comment: WEAKLY REACTIVE  Aerobic/Anaerobic Culture (surgical/deep wound)     Status: None (Preliminary result)   Collection Time: 06/10/17  6:14 PM  Result Value Ref Range Status   Specimen Description WOUND  Final   Special Requests LEFT WRIST  Final   Gram Stain   Final    FEW WBC PRESENT, PREDOMINANTLY PMN NO SQUAMOUS EPITHELIAL CELLS SEEN MODERATE GRAM POSITIVE COCCI IN PAIRS AND CHAINS MODERATE GRAM NEGATIVE RODS    Culture   Final      ABUNDANT VIRIDANS STREPTOCOCCUS CULTURE REINCUBATED FOR BETTER GROWTH    Report Status PENDING  Incomplete  Fungus Culture Result     Status: None   Collection Time: 06/10/17  6:14 PM  Result Value Ref Range Status   Result 1 Comment  Final    Comment: (NOTE) KOH/Calcofluor preparation:  no fungus observed. Performed At: [facility] [address]  [place], [place] [id number] Rush Farmer MD WU:[id number]   Culture, blood (routine x 2)     Status: None (Preliminary result)   Collection Time: 06/11/17  5:50 PM  Result Value Ref Range Status   Specimen Description BLOOD RIGHT HAND  Final   Special Requests   Final    BOTTLES DRAWN AEROBIC ONLY Blood Culture adequate volume   Culture NO GROWTH 2 DAYS  Final   Report Status PENDING  Incomplete  Culture, blood (routine x 2)     Status: None (Preliminary result)   Collection Time: 06/11/17  5:58 PM  Result Value Ref Range Status   Specimen Description BLOOD RIGHT HAND  Final   Special Requests   Final    BOTTLES DRAWN AEROBIC ONLY Blood Culture adequate volume   Culture NO GROWTH 2 DAYS  Final   Report Status PENDING  Incomplete    Thayer Headings, MD [place] for Infectious Disease [facility] [email or web address] [phone] pager  [phone] cell 06/13/2017, 3:24 PM

## 2017-06-13 NOTE — Progress Notes (Signed)
   Subjective: Mr. Goens says he is feeling fine but has some concerns about his care. He says that yesterday his morning medications were placed in a cup on the computer in his room and that he did not receive the medications until later in the day. He is also concerned because the splint that he now has placed on his left wrist was placed on the counter in the room and no one helped him to apply it until later in the day. He denies fevers, rigors, or nausea. He has started to notice difficulty with complete extension of his fourth and fifth digit.   Objective:  Vital signs in last 24 hours: Vitals:   06/12/17 1502 06/12/17 2022 06/13/17 0504 06/13/17 1317  BP: 124/74 129/86 134/84 139/89  Pulse: 100 99 88 84  Resp: 18   18  Temp: 99.1 F (37.3 C) 99.4 F (37.4 C) 99.4 F (37.4 C) 98 F (36.7 C)  TempSrc: Oral Oral Oral Oral  SpO2: 100% 100% 100% 100%  Weight:      Height:       General: well appearing, no acute distress sitting up in the chair beside his bed on the phone  Cardiac : RRR, no murmur appreciated, no edema  Pulm: LCTAB Abdomen: soft, non tender, non distended  Extremities: dressing clean dry and intact over left wrist with overlying splint, left radial pulse strong and intact, unable to completely extend fourth and fifth digit of the left hand. Dressing overlying left ankle ulcer clean and intact.   Assessment/Plan:  Morgon Pamer. Ron is a 57 yo male with history of uncontrolled insulin-dependent F7PZ complicated by diabetic neuropathy, HTN, HLD, alcohol and cocaine use disorder,andpancreatic insufficiency secondary tochronicalcoholic pancreatitiswho presents to the ED with drainage fromLwrist ulcer and found to have osteomyelitis of L ulna on imaging.     Diabetic ulcer of left wrist ([place])   Osteomyelitis ([place]) - continue vanc and cefepime  - culture pending, it appears that these may be from the wound and not the bone, gram stain with GPC and GNR   -  concern for distal ulnar nerve injury given neuro deficits on exam, op note mentions ulnar neurovascular bundle was protected.  - ortho has signed off, we appreciate their care  - wound care center follow up after D/c  Post op fever - single episode, afebrile for the past 48 hours  - blood cx drawn 1/3 with NGTD     Diabetic ulcer of LLE ([place]) - continue wound care     Diabetes mellitus type 2, insulin dependent ([place]) - not controlled today, blood glucose persistently in the 200s  - increase lantus to 27 units ( previously 20 units)  - continue mealtime coverage with 5 units plus sliding scale coverage - if he remains elevated tomorrow may increase the lantus closer to home prescription 34 units   AKI  - creatinine continues to improve, 1.09 today down from 1.44 on admission  HTN  - remains normotensive  Pancreatic insufficiency  - continue creon   HLD  - continue lipitor   Alcohol use disorder  - no signs of withdrawal, last drink 1/1 so he is out of the window for withdrawal  - discontinue CIWA monitoring  Dispo: Anticipated discharge in approximately 1-2 day(s), pending arrangements for outpatient IV abx.   Ledell Noss, MD 06/13/2017, 2:35 PM Pager: [phone]

## 2017-06-14 DIAGNOSIS — A491 Streptococcal infection, unspecified site: Secondary | ICD-10-CM | POA: Diagnosis present

## 2017-06-14 LAB — GLUCOSE, CAPILLARY
Glucose-Capillary: 191 mg/dL — ABNORMAL HIGH (ref 65–99)
Glucose-Capillary: 160 mg/dL — ABNORMAL HIGH (ref 65–99)
Glucose-Capillary: 158 mg/dL — ABNORMAL HIGH (ref 65–99)
Glucose-Capillary: 117 mg/dL — ABNORMAL HIGH (ref 65–99)

## 2017-06-14 LAB — RAPID URINE DRUG SCREEN, HOSP PERFORMED
AMPHETAMINES: NOT DETECTED
Barbiturates: NOT DETECTED
Benzodiazepines: NOT DETECTED
Cocaine: NOT DETECTED
OPIATES: NOT DETECTED
TETRAHYDROCANNABINOL: NOT DETECTED

## 2017-06-14 MED ORDER — DEXTROSE 5 % IV SOLN
2.0000 g | INTRAVENOUS | Status: DC
  Administered 2017-06-14: 2 g via INTRAVENOUS
  Filled 2017-06-14: qty 2

## 2017-06-14 MED ORDER — SODIUM CHLORIDE 0.9% FLUSH: 10.0000 mL | INTRAVENOUS | Status: DC | PRN

## 2017-06-14 MED ORDER — INSULIN GLARGINE 100 UNIT/ML ~~LOC~~ SOLN
30.0000 [IU] | Freq: Every day | SUBCUTANEOUS | Status: DC
  Administered 2017-06-14: 30 [IU] via SUBCUTANEOUS
  Filled 2017-06-14 (×2): qty 0.3

## 2017-06-14 MED ORDER — INSULIN ASPART 100 UNIT/ML ~~LOC~~ SOLN
7.0000 [IU] | Freq: Three times a day (TID) | SUBCUTANEOUS | Status: DC
  Administered 2017-06-14 – 2017-06-15 (×5): 7 [IU] via SUBCUTANEOUS

## 2017-06-14 MED ORDER — SODIUM CHLORIDE 0.9 % IV SOLN
2.0000 g | INTRAVENOUS | Status: DC
  Administered 2017-06-15: 2 g via INTRAVENOUS
  Filled 2017-06-14 (×2): qty 2

## 2017-06-14 NOTE — Progress Notes (Signed)
[facility] for Infectious Disease   Reason for visit: Follow up on osteomyelitis  Interval History: started on ceftriaxone Day 5 antibiotics Day 1 ceftriaxone  Physical Exam: Constitutional:  Vitals:   06/13/17 2030 06/14/17 0423  BP: 122/82 126/79  Pulse: 94 98  Resp:    Temp: 98.2 F (36.8 C) 98.2 F (36.8 C)  SpO2: 100% 100%   patient appears in NAD Respiratory: Normal respiratory effort; CTA B Cardiovascular: RRR GI: soft, nt, nd MS: left wrist wrapped  Review of Systems: Constitutional: negative for fevers and chills Gastrointestinal: negative for diarrhea Integument/breast: negative for rash  Lab Results  Component Value Date   WBC 4.4 06/13/2017   HGB 9.4 (L) 06/13/2017   HCT 28.3 (L) 06/13/2017   MCV 96.6 06/13/2017   PLT 150 06/13/2017    Lab Results  Component Value Date   CREATININE 1.09 06/13/2017   BUN 13 06/13/2017   NA 132 (L) 06/13/2017   K 4.2 06/13/2017   CL 104 06/13/2017   CO2 22 06/13/2017    Lab Results  Component Value Date   ALT 15 (L) 06/10/2017   AST 21 06/10/2017   GGT 375 (H) 02/13/2017   ALKPHOS 82 06/10/2017     Microbiology: Recent Results (from the past 240 hour(s))  Blood culture (routine x 2)     Status: None (Preliminary result)   Collection Time: 06/10/17  1:17 PM  Result Value Ref Range Status   Specimen Description BLOOD RIGHT ANTECUBITAL  Final   Special Requests   Final    BOTTLES DRAWN AEROBIC AND ANAEROBIC Blood Culture adequate volume   Culture NO GROWTH 4 DAYS  Final   Report Status PENDING  Incomplete  Blood culture (routine x 2)     Status: None (Preliminary result)   Collection Time: 06/10/17  1:29 PM  Result Value Ref Range Status   Specimen Description BLOOD RIGHT WRIST  Final   Special Requests   Final    BOTTLES DRAWN AEROBIC AND ANAEROBIC Blood Culture adequate volume   Culture NO GROWTH 4 DAYS  Final   Report Status PENDING  Incomplete  MRSA PCR Screening     Status: Abnormal   Collection Time: 06/10/17  3:27 PM  Result Value Ref Range Status   MRSA by PCR POSITIVE (A) NEGATIVE Final    Comment:        The GeneXpert MRSA Assay (FDA approved for NASAL specimens only), is one component of a comprehensive MRSA colonization surveillance program. It is not intended to diagnose MRSA infection nor to guide or monitor treatment for MRSA infections. RESULT CALLED TO, READ BACK BY AND VERIFIED WITH: RN Marylouise Stacks [id number] MLM   Fungus Culture With Stain     Status: None (Preliminary result)   Collection Time: 06/10/17  6:14 PM  Result Value Ref Range Status   Fungus Stain Final report  Final    Comment: (NOTE) Performed At: [facility] [place], [place] [id number] Rush Farmer MD QM:[id number]    Fungus (Mycology) Culture PENDING  Incomplete   Fungal Source LEFT  Final    Comment: WEAKLY REACTIVE  Aerobic/Anaerobic Culture (surgical/deep wound)     Status: None (Preliminary result)   Collection Time: 06/10/17  6:14 PM  Result Value Ref Range Status   Specimen Description WOUND  Final   Special Requests LEFT WRIST  Final   Gram Stain   Final    FEW WBC PRESENT, PREDOMINANTLY PMN NO SQUAMOUS  EPITHELIAL CELLS SEEN MODERATE GRAM POSITIVE COCCI IN PAIRS AND CHAINS MODERATE GRAM NEGATIVE RODS    Culture   Final    ABUNDANT VIRIDANS STREPTOCOCCUS CULTURE REINCUBATED FOR BETTER GROWTH    Report Status PENDING  Incomplete  Fungus Culture Result     Status: None   Collection Time: 06/10/17  6:14 PM  Result Value Ref Range Status   Result 1 Comment  Final    Comment: (NOTE) KOH/Calcofluor preparation:  no fungus observed. Performed At: [facility] [place], [place] [id number] Rush Farmer MD LH:[id number]   Culture, blood (routine x 2)     Status: None (Preliminary result)   Collection Time: 06/11/17  5:50 PM  Result Value Ref Range Status   Specimen Description BLOOD RIGHT HAND  Final   Special  Requests   Final    BOTTLES DRAWN AEROBIC ONLY Blood Culture adequate volume   Culture NO GROWTH 3 DAYS  Final   Report Status PENDING  Incomplete  Culture, blood (routine x 2)     Status: None (Preliminary result)   Collection Time: 06/11/17  5:58 PM  Result Value Ref Range Status   Specimen Description BLOOD RIGHT HAND  Final   Special Requests   Final    BOTTLES DRAWN AEROBIC ONLY Blood Culture adequate volume   Culture NO GROWTH 3 DAYS  Final   Report Status PENDING  Incomplete    Impression/Plan:  1. Osteomyelitis - growth with Strep viridans.  Sensitivities pending.  If penicillin sensitive, can use IV penicillin per pharmacy starting tomorrow.  Otherwise, ceftriaxone 2 grams daily.  Treat for 6 weeks through February 12th  2. Medication monitoring - weekly labs per home health protocol.  Please place OPAT consult tomorrow after sensitivities known.   3.  Access - to get picc line.    4.  Screening - HIV negative  I will arrange follow up in about 4 weeks.   I will sign off, please call with any questions. thanks

## 2017-06-14 NOTE — Progress Notes (Signed)
   Subjective:  No acute events overnight. Patient feeling very well this morning. Up in chair and eating breakfast when seen. Denies pain. Has been ambulating in room. No complaints this AM. Discussed plan for PICC line placement as he will need long course of antibiotic therapy due to bone infection. States he has had one before and he understands the need for it. All questions answered.   Objective:  Vital signs in last 24 hours: Vitals:   06/13/17 0504 06/13/17 1317 06/13/17 2030 06/14/17 0423  BP: 134/84 139/89 122/82 126/79  Pulse: 88 84 94 98  Resp:  18    Temp: 99.4 F (37.4 C) 98 F (36.7 C) 98.2 F (36.8 C) 98.2 F (36.8 C)  TempSrc: Oral Oral Oral Oral  SpO2: 100% 100% 100% 100%  Weight:      Height:       General: very pleasant male, well-developed, well-nourished, sitting up in chair in no acute distress  Cardiac: regular rate and rhythm, nl S1/S2, no murmurs, rubs or gallops  Pulm: CTAB, no wheezes or crackles, no increased work of breathing  Abd: soft, NTND, bowel sounds present Neuro: A&Ox3, unable to fulll extend R 4 and 5th digits  Ext: warm and well perfused, no peripheral edema noted, LLE ulcer and surgical site bandaged, dressing clean, dry, and intact     Assessment/Plan:  Melvin [name] is a 57 yo male with history of uncontrolled insulin-dependent G2EZ complicated by diabetic neuropathy, HTN, HLD, alcohol and cocaine use disorder,andpancreatic insufficiency secondary tochronicalcoholic pancreatitiswho presents to the ED with drainage fromLwrist ulcer and found to have osteomyelitis of L ulna on imaging.     # Diabetic ulcer of left wrist ([place]) # Osteomyelitis ([place]) Will switch vancomycin and cefepime to Rocephin per ID recommendations.Strep viridian in deep wound cx (not bone cx). Awaiting culture sensitivities to arrange outpatient IV antibiotics. Remains afebrile and HDS. Reports no pain.  - Vanc + cefepime --> Rocephin starting 1/6 - ID  following, appreciate recommendations  - Concern for distal ulnar nerve injury given neuro deficits on exam, op note mentions ulnar neurovascular bundle was protected.  - Wound care center follow up after D/c  # Post op fever: unclear source, maybe inflammatory response from surgical procedure. CXR negative. UA clean. Blood cx NGTD. Has remains afebrile and HDS since then.  - Will continue to monitor    # Diabetic ulcer of LLE ([place]) - Wound care managing    # Diabetes mellitus type 2, insulin dependent ([place]): BG remains elevated. CBG 190s this AM and 190s-200s with meals. Will make adjustments in insulin regimen as below.  - Increase Lantus to 27--> 30 units QHS  - Increase mealtime coverage 5 --> 7units +continue M-SSI   # AKI:  Resolved.   # HTN: remains normotensive - Continue to monitor  # Pancreatic insufficiency  - Continue home Creon   # HLD  - Continue home Lipitor   # Alcohol use disorder: Last drink 1/1. Out of the window for withdrawal.    Dispo: Anticipated discharge in approximately 1-2 day(s) pending PICC line placement and adjustment of antibiotic therapy pending cx sensitivities.   Melvin Roche, MD 06/14/2017, 9:03 AM Pager: [phone]

## 2017-06-14 NOTE — Progress Notes (Signed)
Internal Medicine Attending:   I saw and examined the patient. I reviewed the resident's note and I agree with the resident's findings and plan as documented in the resident's note. No complaints feeling well, understands PICC line process and outpatient IV antibiotics- has had before. Currently on ceftriaxone, appreciate Dr Francene Boyers recommendations, will need ABx through Feb 12th, Currently IV ceftriaxone, can be changed to IV penicillin if sensitive.

## 2017-06-14 NOTE — Progress Notes (Signed)
Peripherally Inserted Central Catheter/Midline Placement  The IV Nurse has discussed with the patient and/or persons authorized to consent for the patient, the purpose of this procedure and the potential benefits and risks involved with this procedure.  The benefits include less needle sticks, lab draws from the catheter, and the patient may be discharged home with the catheter. Risks include, but not limited to, infection, bleeding, blood clot (thrombus formation), and puncture of an artery; nerve damage and irregular heartbeat and possibility to perform a PICC exchange if needed/ordered by physician.  Alternatives to this procedure were also discussed.  Bard Power PICC patient education guide, fact sheet on infection prevention and patient information card has been provided to patient /or left at bedside.    PICC/Midline Placement Documentation  PICC Single Lumen 06/14/17 PICC Right Brachial 38 cm 0 cm (Active)  Indication for Insertion or Continuance of Line Home intravenous therapies (PICC only) 06/14/2017  6:36 PM  Exposed Catheter (cm) 0 cm 06/14/2017  6:36 PM  Site Assessment Clean;Dry;Intact 06/14/2017  6:36 PM  Line Status Flushed;Saline locked;Blood return noted 06/14/2017  6:36 PM  Dressing Type Transparent 06/14/2017  6:36 PM  Dressing Status Clean;Dry;Intact;Antimicrobial disc in place 06/14/2017  6:36 PM  Line Care Connections checked and tightened 06/14/2017  6:36 PM  Line Adjustment (NICU/IV Team Only) No 06/14/2017  6:36 PM  Dressing Intervention New dressing 06/14/2017  6:36 PM  Dressing Change Due 06/21/17 06/14/2017  6:36 PM       Rolena Infante 06/14/2017, 6:39 PM

## 2017-06-15 DIAGNOSIS — Z23 Encounter for immunization: Secondary | ICD-10-CM | POA: Diagnosis not present

## 2017-06-15 DIAGNOSIS — B9689 Other specified bacterial agents as the cause of diseases classified elsewhere: Secondary | ICD-10-CM

## 2017-06-15 DIAGNOSIS — L97921 Non-pressure chronic ulcer of unspecified part of left lower leg limited to breakdown of skin: Secondary | ICD-10-CM

## 2017-06-15 DIAGNOSIS — B954 Other streptococcus as the cause of diseases classified elsewhere: Secondary | ICD-10-CM

## 2017-06-15 DIAGNOSIS — Z95828 Presence of other vascular implants and grafts: Secondary | ICD-10-CM

## 2017-06-15 LAB — GLUCOSE, CAPILLARY
GLUCOSE-CAPILLARY: 174 mg/dL — AB (ref 65–99)
GLUCOSE-CAPILLARY: 132 mg/dL — AB (ref 65–99)
Glucose-Capillary: 104 mg/dL — ABNORMAL HIGH (ref 65–99)

## 2017-06-15 LAB — AEROBIC/ANAEROBIC CULTURE W GRAM STAIN (SURGICAL/DEEP WOUND)

## 2017-06-15 LAB — SEDIMENTATION RATE: SED RATE: 121 mm/h — AB (ref 0–16)

## 2017-06-15 LAB — AEROBIC/ANAEROBIC CULTURE (SURGICAL/DEEP WOUND)

## 2017-06-15 LAB — CULTURE, BLOOD (ROUTINE X 2)
Culture: NO GROWTH
Culture: NO GROWTH
Special Requests: ADEQUATE
Special Requests: ADEQUATE

## 2017-06-15 LAB — C-REACTIVE PROTEIN: CRP: 4.2 mg/dL — AB (ref <1.0)

## 2017-06-15 MED ORDER — CEFTRIAXONE IV (FOR PTA / DISCHARGE USE ONLY): 2.0000 g | INTRAVENOUS | 0 refills | Status: DC

## 2017-06-15 MED ORDER — INSULIN GLARGINE 100 UNIT/ML ~~LOC~~ SOLN: 35.0000 [IU] | Freq: Every day | SUBCUTANEOUS | 3 refills | Status: DC

## 2017-06-15 MED ORDER — INSULIN GLARGINE 100 UNIT/ML ~~LOC~~ SOLN
30.0000 [IU] | Freq: Every day | SUBCUTANEOUS | 3 refills | Status: DC
Start: 2017-06-15 — End: 2017-08-31

## 2017-06-15 MED ORDER — HEPARIN SOD (PORK) LOCK FLUSH 100 UNIT/ML IV SOLN
250.0000 [IU] | INTRAVENOUS | Status: AC | PRN
  Administered 2017-06-15: 250 [IU]

## 2017-06-15 MED ORDER — BACITRACIN ZINC 500 UNIT/GM EX OINT: TOPICAL_OINTMENT | Freq: Two times a day (BID) | CUTANEOUS | 2 refills | Status: AC

## 2017-06-15 NOTE — Care Management Note (Signed)
Case Management Note  Patient Details  Name: Melvin [name] MRN: [id number] Date of Birth: January 24, 1961  Subjective/Objective: 58 yr old gentleman admitted with osteo of left ulna. Underwent a I & D of left wrist.                Action/Plan: Case manager spoke with patient concerning discharge plan. Choice for Home Health agency was offered, referral was called to [place], [place] and to [facility] for IV antibiotics with [place]. Patient says his daughter will be assisting him at discharge.     Expected Discharge Date:   06/16/17               Expected Discharge Plan:  [place]  In-House Referral:     Discharge planning Services  CM Consult  Post Acute Care Choice:  Home Health, Durable Medical Equipment Choice offered to:  Patient  DME Arranged:  IV pump/equipment DME Agency:  [place]:  [facility] Agency:  [place]  Status of Service:  Completed, signed off  If discussed at [place] of Stay Meetings, dates discussed:    Additional Comments:  Ninfa Meeker, RN 06/15/2017, 3:05 PM

## 2017-06-15 NOTE — Progress Notes (Signed)
Medicine attending discharge note: I personally examined this patient on the day of discharge and I attest to the accuracy of the discharge evaluation and plan as summarized in the final progress note by resident physician Dr. Isac Sarna.  57 year old poorly controlled insulin-dependent diabetic.  Additional history of alcohol and cocaine use.  Chronic pancreatic insufficiency from chronic alcoholic pancreatitis.  He presented on the day of admission June 10, 2017 with a large 4 x 4 centimeter ulcer with purulent exudate on his left wrist and an additional 5 x 5 cm ulcer on his left lateral ankle.  No fever.  No leukocytosis.  X-rays showed osteomyelitis involving the left ulnar bone.  No bony changes in the ankle.  He was seen in consultation by orthopedic surgery.  He was taken to the OR for debridement of the wrist ulcer.  Gram stain showed both gram-positive and gram-negative organisms but wound cultures grew only strep viridans.  No anaerobes.  No gram-negative organisms.  Survey blood cultures showed no growth at 5 days.  He had a transient fever to 101.5 within 24 hours of admission but defervesced on antibiotics and remained afebrile for the duration of the hospital course. HIV screen negative.  Initial urine drug screen negative.  Alcohol not detected. A PICC catheter was placed on January 6.  He will continue parenteral ceftriaxone to complete a 6-week course through July 21, 2017. Ongoing local wound care for both areas of involvement. Insulin regimen at discharge Lantus 30 units at bedtime with sliding scale regular to cover meals.  Disposition: Condition stable at time of discharge Follow-up with infectious disease and with the community health and wellness center There were no complications

## 2017-06-15 NOTE — Progress Notes (Signed)
[facility]  [facility] will provide HH and Home Infusion [facility] for home IV ABX at DC.   [facility] Infusion Coordinator will provide in hospital teaching with the pt to support independence at home .   If patient discharges after hours, please call [phone].   Larry Sierras 06/15/2017, 4:55 PM

## 2017-06-15 NOTE — Progress Notes (Signed)
Discharge instructions and prescriptions reviewed with patient. Reviewed PICC line care and that [facility] would be taking care of line. No questions/concerns at this time. PICC flushed and capped by PICC team. VSS. Waiting for wheelchair transport to lobby for discharge.

## 2017-06-15 NOTE — Progress Notes (Signed)
   Subjective:  No acute events overnight.  Patient continues to do well and reports no complaints this morning when seen.  Discussed plan of awaiting culture sensitivities to determine outpatient IV antibiotic therapy.  Patient understands need for PICC line prolonged IV antibiotic therapy.  He is in agreement with plan.  All questions were answered.  Objective:  Vital signs in last 24 hours: Vitals:   06/14/17 0423 06/14/17 1327 06/14/17 2004 06/15/17 0516  BP: 126/79 96/68 132/79 119/69  Pulse: 98 (!) 101 100 (!) 108  Resp:  18  17  Temp: 98.2 F (36.8 C) 98 F (36.7 C) 98.5 F (36.9 C) 100 F (37.8 C)  TempSrc: Oral Oral Oral Oral  SpO2: 100% 100% 100% 100%  Weight:      Height:       General: Very pleasant male, well-nourished, well-developed, lying in bed in no acute distress Cardiac: regular rate and rhythm, nl S1/S2, no murmurs, rubs or gallops  Pulm: CTAB, no wheezes or crackles, no increased work of breathing  Abd: soft, NTND, bowel sounds present Neuro: A&Ox3, Coumadin range of motion of fourth and fifth digit and left hand Ext: warm and well perfused, no peripheral edema, PICC line in place on RUE, left upper extremity wound remains bandaged, left lower extremity ulcer bandaged with dressing clean, dry, and intact   Assessment/Plan:  Melvin [name]. Po is a 57 yo male with history of uncontrolled insulin-dependent Q1JH complicated by diabetic neuropathy, HTN, HLD, alcohol and cocaine use disorder,andpancreatic insufficiency secondary tochronicalcoholic pancreatitiswho presents to the ED with drainage fromLwrist ulcer and found to have osteomyelitis of L ulna on imaging.  # Diabetic ulcer of left wrist ([place]) # Osteomyelitis ([place]) Pan-sensitive Strep viridian in deep wound cx (not bone cx). Currently on Rocephin which will continue until  07/21/2017. Remains afebrile and HDS. Reports no pain.  - Continue Rocephin starting 1/6-2/12 - ID following, appreciate  recommendations  - Wound care center follow up after D/c  # Post op fever: unclear source, maybe inflammatory response from surgical procedure. CXR negative. UA clean. Blood cx NGTD. Has remains afebrile and HDS since then.  - Will continue to monitor   # Diabetic ulcer of LLE([facility]) - Wound caremanaging   # Diabetes mellitus type 2, insulin dependent ([place]): BG remains elevated. CBG 190s this AM and 190s-200s with meals. Will make adjustments in insulin regimen as below.  - Increase Lantus to 27--> 30 units QHS  - Increase mealtime coverage 5 --> 7units +continue M-SSI   # HTN: remains normotensive - Continue to monitor  # Pancreatic insufficiency  - Continue home Creon   # HLD  - Continue home Lipitor   # Alcohol use disorder: Last drink 1/1. Out of the window for withdrawal.    Dispo: Anticipated discharge today.   Welford Roche, MD 06/15/2017, 5:55 AM Pager: [phone]

## 2017-06-15 NOTE — Discharge Summary (Signed)
Name: Melvin [name] MRN: [id number] DOB: 08-Apr-1961 57 y.o. PCP: Melvin Morale, MD  Date of Admission: 06/10/2017 11:50 AM Date of Discharge: 06/15/2017 Attending Physician: Melvin Belt, MD  Discharge Diagnosis: 1. Osteomyelitis of ulna  2. Diabetic ulcer in L ankle  3. Insulin-dependent T2DM   Active Problems:   Osteomyelitis (HCC)   Diabetic ulcer of ankle (HCC)   Diabetic ulcer of left wrist (HCC)   Diabetes mellitus type 2, insulin dependent ([place])   Streptococcus viridans infection   Skin ulcer of left lower leg, limited to breakdown of skin Melvin [name])   Discharge Medications: Allergies as of 06/15/2017   No Known Allergies     Medication List    TAKE these medications   ACCU-CHEK NANO SMARTVIEW w/Device Kit Use as directed for 3 times daily testing of blood glucose.   accu-chek softclix lancets Use as instructed for 3 times daily testing of blood sugar. E11.9   ACCU-CHEK SOFTCLIX LANCETS lancets Use as instructed for 3 times daily testing of blood sugar. E11.9   ACCU-CHEK FASTCLIX LANCETS Misc Use as directed for 3 times daily testing of blood glucose. E11.9   aspirin 81 MG EC tablet Take 1 tablet (81 mg total) by mouth daily.   atorvastatin 10 MG tablet Commonly known as:  LIPITOR TAKE 1 TABLET EVERY DAY AT 6  P.M. What changed:  See the new instructions.   B-D SINGLE USE SWABS REGULAR Pads USE AS DIRECTED THREE TIMES DAILY  AND AT BEDTIME   bacitracin ointment Apply topically 2 (two) times daily.   cefTRIAXone IVPB Commonly known as:  ROCEPHIN Inject 2 g into the vein daily. Indication: Strep viridans osteo Last Day of Therapy:  07/21/17 Labs - Once weekly:  CBC/D and BMP, Labs - Every other week:  ESR and CRP   gabapentin 300 MG capsule Commonly known as:  NEURONTIN Take 2 capsules (600 mg total) by mouth 2 (two) times daily.   glucose blood test strip Commonly known as:  ACCU-CHEK SMARTVIEW Use as instructed for 3 times daily testing  of blood glucose. E11.9   insulin aspart 100 UNIT/ML injection Commonly known as:  novoLOG 150-200, give 3 units 201-250 give 5 units 251-300 give 7 units 301-350 give 9 units 351-400 give 12 units What changed:    how much to take  how to take this  when to take this  additional instructions   insulin glargine 100 UNIT/ML injection Commonly known as:  LANTUS Inject 0.3 mLs (30 Units total) into the skin at bedtime. What changed:  how much to take   Insulin Syringe-Needle U-100 31G X 5/16" 0.5 ML Misc Commonly known as:  B-D INS SYRINGE 0.5CC/31GX5/16 USE TO INJECT INSULIN 3 TIMES A DAY AND AT BEDTIME   naproxen 500 MG tablet Commonly known as:  NAPROSYN TAKE 1 TABLET (500 MG TOTAL) BY MOUTH 2 (TWO) TIMES DAILY WITH A MEAL. What changed:    when to take this  reasons to take this   Pancrelipase (Lip-Prot-Amyl) 20000-63000 units Cpep Commonly known as:  ZENPEP Take 20,000 Units by mouth 3 (three) times daily. What changed:  when to take this   silver sulfADIAZINE 1 % cream Commonly known as:  SILVADENE Apply 1 application topically daily. What changed:    when to take this  additional instructions            Home Infusion Instuctions  (From admission, onward)        Start  Ordered   06/15/17 0000  Home infusion instructions [facility] May follow [place] Dosing Protocol; May administer Cathflo as needed to maintain patency of vascular access device.; Flushing of vascular access device: per [facility] Protocol: 0.9% NaCl pre/post medica...    Question Answer Comment  Instructions May follow [place] Dosing Protocol   Instructions May administer Cathflo as needed to maintain patency of vascular access device.   Instructions Flushing of vascular access device: per [facility] Protocol: 0.9% NaCl pre/post medication administration and prn patency; Heparin 100 u/ml, 77m for implanted ports and Heparin 10u/ml, 521mfor all other central venous catheters.     Instructions May follow [facility] Anaphylaxis Protocol for First Dose Administration in the home: 0.9% NaCl at 25-50 ml/hr to maintain IV access for protocol meds. Epinephrine 0.3 ml IV/IM PRN and Benadryl 25-50 IV/IM PRN s/s of anaphylaxis.   Instructions [facility] Infusion Coordinator (RN) to assist per patient IV care needs in the home PRN.      06/15/17 1917      Disposition and follow-up:   [name]  At the [name] follow up visit please address:  1.  Please assess surgical wound  for signs of infection.  Please assess left ankle ulcer esents for signs of infections as well.  Please assess compliance with IV antibiotic therapy. Please ensure patient has follow-up with wound care center and with Ortho.  2.  Labs / imaging needed at time of follow-up: None   3.  Pending labs/ test needing follow-up: None   Follow-up Appointments: Follow-up Information    Melvin MoraleMD Follow up.   Specialty:  Family Medicine Why:  Please call your regular doctors office to make a [name] follow-up appointment within the next 7 days. Contact information: [phone]      Melvin MoraleMD Follow up.   Specialty:  Family Medicine Contact information: [address] [place] [phone]      Health, [facility]-Home Follow up.   Specialty:  [place]:  A representative from [facility] contact you to arrange start date and time for the Home Health Nurse.for IV antibiotics and PICC Care. Contact information: [phone] by problem list:   MiMarston MccaddenWiDesserts a [age]o male with history of uncontrolled insulin-dependent T2L2GMomplicated by diabetic neuropathy, HTN, HLD, alcohol and cocaine use disorder,andpancreatic  insufficiency secondary tochronicalcoholic pancreatitiswho presents to the ED with drainage fromLwrist ulcer.   1. Osteomyelitis of ulna: Patient presented to the ED with left wrist ulcer with purulent drainage. He was afebrile and HDS.  On exam he had limited range of motion of fourth and fifth digit of L hand.  Imaging revealed osteomyelitis of the distal ulna and he went for debridement on 1/2.  Deep wound cultures grew pan-sensitive Streptococcus viridians. PICC line placed and he was discharged home on 6 weeks of Rocephin (until 07/21/2017). No bone cx obtained. Home health RN to help manage IV antibiotic therapy.  2. Post-op fever: Patient developed 101 fever on postop day 1. Chest x-ray, UA, and blood cultures negative.  He remained afebrile during the rest of his hospitalization.   2. Diabetic ulcer in L ankle: Patient presented with left ankle ulcer with  no associated purulence.  Imaging without evidence of osteomyelitis.  Remained stable during his admission.  He was referred to the wound care center for further care.  3. Insulin-dependent T2DM: Patient with labile BG during this admission with several episodes of hypoglycemia.  He was discharged on Lantus 35 units and sliding scale.  Discharge Vitals:   BP 116/73 (BP Location: Right Arm)   Pulse 95   Temp 98.9 F (37.2 C) (Oral)   Resp 16   Ht _0  (1.727 m)   Wt 163 lb (73.9 kg)   SpO2 98%   BMI 24.78 kg/m   Pertinent Labs, Studies, and Procedures:  CBC Latest Ref Rng & Units 06/13/2017 06/11/2017 06/10/2017  WBC 4.0 - 10.5 K/uL 4.4 6.4 7.0  Hemoglobin 13.0 - 17.0 g/dL 9.4(L) 9.7(L) 11.9(L)  Hematocrit 39.0 - 52.0 % 28.3(L) 28.8(L) 34.5(L)  Platelets 150 - 400 K/uL 150 164 217   BMP Latest Ref Rng & Units 06/13/2017 06/12/2017 06/12/2017  Glucose 65 - 99 mg/dL 318(H) 435(H) 439(H)  BUN 6 - 20 mg/dL 13 - 16  Creatinine 0.61 - 1.24 mg/dL 1.09 - 1.24  BUN/Creat Ratio 9 - 20 - - -  Sodium 135 - 145 mmol/L 132(L) - 129(L)    Potassium 3.5 - 5.1 mmol/L 4.2 - 4.2  Chloride 101 - 111 mmol/L 104 - 102  CO2 22 - 32 mmol/L 22 - 21(L)  Calcium 8.9 - 10.3 mg/dL 8.1(L) - 8.1(L)    DG L wrist 06/10/2017: IMPRESSION: Soft tissue wound with underlying erosion of the distal ulna consistent with osteomyelitis.  DG L tib/fib 06/10/2017 : IMPRESSION: Lower leg soft tissue wound without evidence of active osteomyelitis or other acute osseous abnormality.  DG Chest 06/11/2017: FINDINGS: The heart size and mediastinal contours are within normal limits. Both lungs are clear. The visualized skeletal structures are unremarkable.   Discharge Instructions: Discharge Instructions    AMB referral to wound care center   Complete by:  As directed    Please assess L wrist and LLE ulcers.   Call MD for:  difficulty breathing, headache or visual disturbances   Complete by:  As directed    Call MD for:  extreme fatigue   Complete by:  As directed    Call MD for:  persistant nausea and vomiting   Complete by:  As directed    Call MD for:  severe uncontrolled pain   Complete by:  As directed    Call MD for:  temperature >100.4   Complete by:  As directed    Diet - low sodium heart healthy   Complete by:  As directed    Face-to-face encounter (required for Medicare/Medicaid patients)   Complete by:  As directed    I Welford Roche certify that this patient is under my care and that I, or a nurse practitioner or physician's assistant working with me, had a face-to-face encounter that meets the physician face-to-face encounter requirements with this patient on 06/15/2017. The encounter with the patient was in whole, or in part for the following medical condition(s) which is the primary reason for home health care (List medical condition): Ulnar osteomyelitis   The encounter with the patient was in whole, or in part, for the following medical condition, which is the primary reason for home health care:  Osteomyelitis   I certify  that, based on my findings, the following services are medically necessary home health services:  Nursing   Reason for Medically [place]  Services:  Other See Comments   My clinical findings support the need for the above services:  OTHER SEE COMMENTS   Further, I certify that my clinical findings support that this patient is homebound due to:  Open/draining pressure/stasis ulcer   Home Health   Complete by:  As directed    To provide the following care/treatments:  RN   Home infusion instructions [facility] May follow [place] Dosing Protocol; May administer Cathflo as needed to maintain patency of vascular access device.; Flushing of vascular access device: per [facility] Protocol: 0.9% NaCl pre/post medica...   Complete by:  As directed    Instructions:  May follow [place] Dosing Protocol   Instructions:  May administer Cathflo as needed to maintain patency of vascular access device.   Instructions:  Flushing of vascular access device: per [facility] Protocol: 0.9% NaCl pre/post medication administration and prn patency; Heparin 100 u/ml, 2m for implanted ports and Heparin 10u/ml, 552mfor all other central venous catheters.   Instructions:  May follow [facility] Anaphylaxis Protocol for First Dose Administration in the home: 0.9% NaCl at 25-50 ml/hr to maintain IV access for protocol meds. Epinephrine 0.3 ml IV/IM PRN and Benadryl 25-50 IV/IM PRN s/s of anaphylaxis.   Instructions:  [facility] Coord[facility] (RN) to assist per patient IV care needs in the home PRN.   Increase activity slowly   Complete by:  As directed    Outpatient Parenteral Antibiotic Therapy Consult   Complete by:  As directed    Patient will need Ceftriaxone 2 g daily until February 12th for ulnar osteomyelitis.      Signed: Welford RocheMD 06/16/2017, 11:34 AM   Pager: [phone]

## 2017-06-15 NOTE — Progress Notes (Signed)
PHARMACY CONSULT NOTE FOR:  OUTPATIENT  PARENTERAL ANTIBIOTIC THERAPY (OPAT)  Indication: Strep viridans osteo Regimen: Ceftriaxone 2g IV Q24h End date: 07/21/17  IV antibiotic discharge orders are pended. To discharging provider:  please sign these orders via discharge navigator,  Select New Orders & click on the button choice - Manage This Unsigned Work.   Thank you for allowing pharmacy to be a part of this patient's care.  [name] 06/15/2017, 2:13 PM

## 2017-06-15 NOTE — Anesthesia Postprocedure Evaluation (Signed)
Anesthesia Post Note  Patient: Melvin [name]  Procedure(s) Performed: IRRIGATION AND DEBRIDEMENT EXTREMITY, left wrist (Left Wrist)     Patient location during evaluation: PACU Anesthesia Type: General Level of consciousness: awake and alert Pain management: pain level controlled Vital Signs Assessment: post-procedure vital signs reviewed and stable Respiratory status: spontaneous breathing, nonlabored ventilation, respiratory function stable and patient connected to nasal cannula oxygen Cardiovascular status: blood pressure returned to baseline and stable Postop Assessment: no apparent nausea or vomiting Anesthetic complications: no    Last Vitals:  Vitals:   06/14/17 2004 06/15/17 0516  BP: 132/79 119/69  Pulse: 100 (!) 108  Resp:  17  Temp: 36.9 C 37.8 C  SpO2: 100% 100%    Last Pain:  Vitals:   06/15/17 0516  TempSrc: Oral  PainSc:                  Filippo Puls

## 2017-06-16 DIAGNOSIS — K859 Acute pancreatitis without necrosis or infection, unspecified: Secondary | ICD-10-CM | POA: Diagnosis not present

## 2017-06-16 DIAGNOSIS — A491 Streptococcal infection, unspecified site: Secondary | ICD-10-CM | POA: Diagnosis not present

## 2017-06-16 DIAGNOSIS — M869 Osteomyelitis, unspecified: Secondary | ICD-10-CM | POA: Diagnosis not present

## 2017-06-16 LAB — CULTURE, BLOOD (ROUTINE X 2)
CULTURE: NO GROWTH
Culture: NO GROWTH
Special Requests: ADEQUATE
Special Requests: ADEQUATE

## 2017-06-17 ENCOUNTER — Telehealth: Payer: Self-pay | Admitting: Family Medicine

## 2017-06-17 DIAGNOSIS — E1140 Type 2 diabetes mellitus with diabetic neuropathy, unspecified: Secondary | ICD-10-CM | POA: Diagnosis not present

## 2017-06-17 DIAGNOSIS — K861 Other chronic pancreatitis: Secondary | ICD-10-CM | POA: Diagnosis not present

## 2017-06-17 DIAGNOSIS — M86132 Other acute osteomyelitis, left radius and ulna: Secondary | ICD-10-CM | POA: Diagnosis not present

## 2017-06-17 DIAGNOSIS — L97222 Non-pressure chronic ulcer of left calf with fat layer exposed: Secondary | ICD-10-CM | POA: Diagnosis not present

## 2017-06-17 DIAGNOSIS — L98492 Non-pressure chronic ulcer of skin of other sites with fat layer exposed: Secondary | ICD-10-CM | POA: Diagnosis not present

## 2017-06-17 DIAGNOSIS — E1165 Type 2 diabetes mellitus with hyperglycemia: Secondary | ICD-10-CM | POA: Diagnosis not present

## 2017-06-17 DIAGNOSIS — E1169 Type 2 diabetes mellitus with other specified complication: Secondary | ICD-10-CM | POA: Diagnosis not present

## 2017-06-17 DIAGNOSIS — I110 Hypertensive heart disease with heart failure: Secondary | ICD-10-CM | POA: Diagnosis not present

## 2017-06-17 DIAGNOSIS — E11622 Type 2 diabetes mellitus with other skin ulcer: Secondary | ICD-10-CM | POA: Diagnosis not present

## 2017-06-17 NOTE — Telephone Encounter (Signed)
Tracey from [facility] called requesting for a verbal order for iv antibiotic, cite care, and wound care to proceed with [facility] services. Please follow up.

## 2017-06-17 NOTE — Telephone Encounter (Signed)
Ok to give verbal orders?

## 2017-06-18 ENCOUNTER — Telehealth: Payer: Self-pay | Admitting: Family Medicine

## 2017-06-18 DIAGNOSIS — Z961 Presence of intraocular lens: Secondary | ICD-10-CM | POA: Diagnosis not present

## 2017-06-18 DIAGNOSIS — E113293 Type 2 diabetes mellitus with mild nonproliferative diabetic retinopathy without macular edema, bilateral: Secondary | ICD-10-CM | POA: Diagnosis not present

## 2017-06-18 DIAGNOSIS — H40013 Open angle with borderline findings, low risk, bilateral: Secondary | ICD-10-CM | POA: Diagnosis not present

## 2017-06-18 LAB — HM DIABETES EYE EXAM

## 2017-06-18 NOTE — Telephone Encounter (Signed)
Okay to give verbal orders?  ?

## 2017-06-18 NOTE — Telephone Encounter (Signed)
OK to give verbal orders 

## 2017-06-18 NOTE — Telephone Encounter (Signed)
Linus Orn was called and given the verbal orders to proceed with patient.

## 2017-06-19 DIAGNOSIS — E1140 Type 2 diabetes mellitus with diabetic neuropathy, unspecified: Secondary | ICD-10-CM | POA: Diagnosis not present

## 2017-06-19 DIAGNOSIS — L97222 Non-pressure chronic ulcer of left calf with fat layer exposed: Secondary | ICD-10-CM | POA: Diagnosis not present

## 2017-06-19 DIAGNOSIS — E1165 Type 2 diabetes mellitus with hyperglycemia: Secondary | ICD-10-CM | POA: Diagnosis not present

## 2017-06-19 DIAGNOSIS — L98492 Non-pressure chronic ulcer of skin of other sites with fat layer exposed: Secondary | ICD-10-CM | POA: Diagnosis not present

## 2017-06-19 DIAGNOSIS — K861 Other chronic pancreatitis: Secondary | ICD-10-CM | POA: Diagnosis not present

## 2017-06-19 DIAGNOSIS — M86132 Other acute osteomyelitis, left radius and ulna: Secondary | ICD-10-CM | POA: Diagnosis not present

## 2017-06-19 DIAGNOSIS — E1169 Type 2 diabetes mellitus with other specified complication: Secondary | ICD-10-CM | POA: Diagnosis not present

## 2017-06-19 DIAGNOSIS — I110 Hypertensive heart disease with heart failure: Secondary | ICD-10-CM | POA: Diagnosis not present

## 2017-06-19 DIAGNOSIS — E11622 Type 2 diabetes mellitus with other skin ulcer: Secondary | ICD-10-CM | POA: Diagnosis not present

## 2017-06-22 ENCOUNTER — Encounter: Payer: Self-pay | Admitting: Physician Assistant

## 2017-06-22 ENCOUNTER — Ambulatory Visit: Payer: Medicare HMO | Admitting: Physician Assistant

## 2017-06-22 ENCOUNTER — Encounter (HOSPITAL_COMMUNITY): Payer: Self-pay

## 2017-06-22 ENCOUNTER — Other Ambulatory Visit: Payer: Self-pay

## 2017-06-22 ENCOUNTER — Emergency Department (HOSPITAL_COMMUNITY): Payer: Medicare HMO

## 2017-06-22 ENCOUNTER — Emergency Department (HOSPITAL_COMMUNITY)
Admission: EM | Admit: 2017-06-22 | Discharge: 2017-06-22 | Disposition: A | Discharge status: Home / Self Care | Payer: Medicare HMO | Attending: Emergency Medicine | Admitting: Emergency Medicine

## 2017-06-22 VITALS — BP 70/40 | HR 105 | Ht 68.0 in | Wt 162.0 lb

## 2017-06-22 DIAGNOSIS — I959 Hypotension, unspecified: Secondary | ICD-10-CM | POA: Diagnosis not present

## 2017-06-22 DIAGNOSIS — R197 Diarrhea, unspecified: Secondary | ICD-10-CM

## 2017-06-22 DIAGNOSIS — E119 Type 2 diabetes mellitus without complications: Secondary | ICD-10-CM | POA: Diagnosis not present

## 2017-06-22 DIAGNOSIS — Z794 Long term (current) use of insulin: Secondary | ICD-10-CM | POA: Diagnosis not present

## 2017-06-22 DIAGNOSIS — K8689 Other specified diseases of pancreas: Secondary | ICD-10-CM | POA: Diagnosis not present

## 2017-06-22 DIAGNOSIS — Z7982 Long term (current) use of aspirin: Secondary | ICD-10-CM | POA: Diagnosis not present

## 2017-06-22 DIAGNOSIS — R42 Dizziness and giddiness: Secondary | ICD-10-CM

## 2017-06-22 DIAGNOSIS — I509 Heart failure, unspecified: Secondary | ICD-10-CM | POA: Insufficient documentation

## 2017-06-22 DIAGNOSIS — E860 Dehydration: Secondary | ICD-10-CM | POA: Diagnosis not present

## 2017-06-22 DIAGNOSIS — Z79899 Other long term (current) drug therapy: Secondary | ICD-10-CM | POA: Insufficient documentation

## 2017-06-22 LAB — BASIC METABOLIC PANEL
Anion gap: 10 (ref 5–15)
BUN: 13 mg/dL (ref 6–20)
CHLORIDE: 103 mmol/L (ref 101–111)
CO2: 24 mmol/L (ref 22–32)
CREATININE: 1.48 mg/dL — AB (ref 0.61–1.24)
Calcium: 9.2 mg/dL (ref 8.9–10.3)
GFR calc Af Amer: 58 mL/min — ABNORMAL LOW (ref >60)
GFR calc non Af Amer: 50 mL/min — ABNORMAL LOW (ref >60)
Glucose, Bld: 93 mg/dL (ref 65–99)
Potassium: 3.8 mmol/L (ref 3.5–5.1)
Sodium: 137 mmol/L (ref 135–145)

## 2017-06-22 LAB — I-STAT TROPONIN, ED: Troponin i, poc: 0 ng/mL (ref 0.00–0.08)

## 2017-06-22 LAB — CBC
HCT: 33.6 % — ABNORMAL LOW (ref 39.0–52.0)
Hemoglobin: 11.3 g/dL — ABNORMAL LOW (ref 13.0–17.0)
MCH: 32.3 pg (ref 26.0–34.0)
MCHC: 33.6 g/dL (ref 30.0–36.0)
MCV: 96 fL (ref 78.0–100.0)
PLATELETS: 451 10*3/uL — AB (ref 150–400)
RBC: 3.5 MIL/uL — ABNORMAL LOW (ref 4.22–5.81)
RDW: 13.8 % (ref 11.5–15.5)
WBC: 5.3 10*3/uL (ref 4.0–10.5)

## 2017-06-22 LAB — BRAIN NATRIURETIC PEPTIDE: B Natriuretic Peptide: 73.1 pg/mL (ref 0.0–100.0)

## 2017-06-22 LAB — I-STAT CG4 LACTIC ACID, ED: Lactic Acid, Venous: 0.88 mmol/L (ref 0.5–1.9)

## 2017-06-22 LAB — CBG MONITORING, ED: GLUCOSE-CAPILLARY: 84 mg/dL (ref 65–99)

## 2017-06-22 MED ORDER — PANCRELIPASE (LIP-PROT-AMYL) 25000-79000 UNITS PO CPEP: 25000.0000 [IU] | ORAL_CAPSULE | Freq: Three times a day (TID) | ORAL | 6 refills | Status: AC

## 2017-06-22 MED ORDER — PANCRELIPASE (LIP-PROT-AMYL) 25000-79000 UNITS PO CPEP: ORAL_CAPSULE | ORAL | 6 refills | Status: DC

## 2017-06-22 MED ORDER — SODIUM CHLORIDE 0.9 % IV BOLUS (SEPSIS)
1000.0000 mL | Freq: Once | INTRAVENOUS | Status: AC
  Administered 2017-06-22: 1000 mL via INTRAVENOUS

## 2017-06-22 NOTE — Progress Notes (Signed)
Agree with assessment and plan as outlined.  

## 2017-06-22 NOTE — Patient Outreach (Signed)
[facility]) Care Management  06/22/2017  Melvin [name] [birth year] [id number]  Transition of care  Referral date: 06/19/17 Referral source: discharged [place] on 06/15/17 Insurance: [employer] Attempt #1  Telephone call to patient regarding transition of care follow up. HIPAA verified with patient. Patient states he is on his way to the emergency room. Request call back at another time.   PLAN: RNCM will attempt 2nd telephone call to patient within 3 business days.   Quinn Plowman RN,BSN,CCM [facility] Telephonic  [phone]

## 2017-06-22 NOTE — ED Notes (Signed)
Family at bedside delivered food to pt. Who has finished meal.

## 2017-06-22 NOTE — ED Notes (Signed)
Called main lab inquiring about CBC, BMP results from blood collected and tubed at [facility] advised that the BMP is still in the centrifuge and they will run the CBC, BNP now.

## 2017-06-22 NOTE — ED Notes (Signed)
Pt ambulated to bathroom 

## 2017-06-22 NOTE — Progress Notes (Signed)
Subjective:    Patient ID: Melvin [name], male    DOB: [birth year], 57 y.o.   MRN: [id number]  HPI Melvin [name] is a pleasant 57 year old African-American male, known to Dr. Havery Moros from colonoscopy done for screening in September 2018. He was found to have diverticulosis in the ascending colon and had 3 polyps removed which were tubular adenomas. Patient comes in today with complaints of diarrhea which she says been chronic over the past 4-5 months but a bit worse over the past several weeks. He says every time he eats he will have fairly urgent diarrhea about an hour later and has also been getting up at least once at night for diarrhea and sometimes has incontinence. He denies any abdominal pain or cramping, denies any fever or chills no nausea or vomiting appetite has been good. Patient has history of congestive heart failure, hypertension adult-onset diabetes mellitus with neuropathy currently insulin-dependent, history of polysubstance abuse. He is felt to have chronic pancreatitis with pancreatic insufficiency and has been on Zenpep 20,000 units with each meal. Qualitative fecal fat done in September 2018 was increased He also had stool cultures stool for C. difficile etc. in September which were negative. Patient had a recent admission January 1 through the seventh for a diabetic ulcer on his left ankle and osteomyelitis of his Ulna.. Cultures from the left wrist wound grew strep viridans. He was treated with IV antibiotics, then had a PICC line placed and discharged home for a several week course of Rocephin.  Patient says he felt okay yesterday though he slept most of the day which his daughter says is unusual. Today he has been feeling dizzy and lightheaded with walking. His diarrhea is at his baseline of about 4-5 times per day. Eval in the office, patient tachycardic with pulse of 105, we are unable to hear a blood pressure, palpable at 92-42 systolic. He is mentating well.  Review  of Systems Pertinent positive and negative review of systems were noted in the above HPI section.  All other review of systems was otherwise negative.  Outpatient Encounter Medications as of 06/22/2017  Medication Sig  . ACCU-CHEK FASTCLIX LANCETS MISC Use as directed for 3 times daily testing of blood glucose. E11.9  . ACCU-CHEK SOFTCLIX LANCETS lancets Use as instructed for 3 times daily testing of blood sugar. E11.9  . Alcohol Swabs (B-D SINGLE USE SWABS REGULAR) PADS USE AS DIRECTED THREE TIMES DAILY  AND AT BEDTIME  . aspirin 81 MG EC tablet Take 1 tablet (81 mg total) by mouth daily.  Marland Kitchen atorvastatin (LIPITOR) 10 MG tablet TAKE 1 TABLET EVERY DAY AT 6  P.M. (Patient taking differently: Take 10 mg once a day at 6 PM)  . bacitracin ointment Apply topically 2 (two) times daily.  . Blood Glucose Monitoring Suppl (ACCU-CHEK NANO SMARTVIEW) w/Device KIT Use as directed for 3 times daily testing of blood glucose.  . cefTRIAXone (ROCEPHIN) IVPB Inject 2 g into the vein daily. Indication: Strep viridans osteo Last Day of Therapy:  07/21/17 Labs - Once weekly:  CBC/D and BMP, Labs - Every other week:  ESR and CRP  . gabapentin (NEURONTIN) 300 MG capsule Take 2 capsules (600 mg total) by mouth 2 (two) times daily.  Marland Kitchen glucose blood (ACCU-CHEK SMARTVIEW) test strip Use as instructed for 3 times daily testing of blood glucose. E11.9  . insulin aspart (NOVOLOG) 100 UNIT/ML injection 150-200, give 3 units 201-250 give 5 units 251-300 give 7 units 301-350 give 9 units 351-400  give 12 units (Patient taking differently: Inject 3-12 Units into the skin See admin instructions. 3-12 units three times a day before meals PER SLIDING SCALE: BGL 150-200 = 3 units; 201-250 = 5 units; 251-300 = 7 units; 301-350 = 9 units; 351-400 = 12 units)  . insulin glargine (LANTUS) 100 UNIT/ML injection Inject 0.3 mLs (30 Units total) into the skin at bedtime.  . Insulin Syringe-Needle U-100 (B-D INS SYRINGE 0.5CC/31GX5/16) 31G X  5/16" 0.5 ML MISC USE TO INJECT INSULIN 3 TIMES A DAY AND AT BEDTIME  . Lancet Devices (ACCU-CHEK SOFTCLIX) lancets Use as instructed for 3 times daily testing of blood sugar. E11.9  . naproxen (NAPROSYN) 500 MG tablet TAKE 1 TABLET (500 MG TOTAL) BY MOUTH 2 (TWO) TIMES DAILY WITH A MEAL. (Patient taking differently: Take 500 mg by mouth 2 (two) times daily as needed (for pain). )  . Pancrelipase, Lip-Prot-Amyl, (ZENPEP) 20000-63000 units CPEP Take 20,000 Units by mouth 3 (three) times daily. (Patient taking differently: Take 20,000 Units by mouth 3 (three) times daily before meals. )  . silver sulfADIAZINE (SILVADENE) 1 % cream Apply 1 application topically daily. (Patient taking differently: Apply 1 application topically See admin instructions. 1 application to the left hand two times a day)   No facility-administered encounter medications on file as of 06/22/2017.    No Known Allergies Patient Active Problem List   Diagnosis Date Noted  . Skin ulcer of left lower leg, limited to breakdown of skin ([place])   . Streptococcus viridans infection 06/14/2017  . Diabetes mellitus type 2, insulin dependent ([place])   . Diabetic ulcer of ankle ([place]) 06/11/2017  . Diabetic ulcer of left wrist ([place]) 06/11/2017  . Cellulitis of left forearm   . Abscess 06/10/2017  . Osteomyelitis ([place]) 06/10/2017  . Pancreatic insufficiency 02/13/2017  . AKI (acute kidney injury) ([place]) 06/18/2016  . Diabetes mellitus without complication ([place]) 67/20/9470  . Chronic diastolic CHF (congestive heart failure) ([place]) 06/18/2016  . Diarrhea 06/18/2016  . Unilateral primary osteoarthritis, left knee 04/23/2016  . Hyperlipidemia 11/14/2014  . Erectile dysfunction 11/14/2014  . Diabetic neuropathy ([place]) 10/31/2014  . Hypoglycemia associated with diabetes ([place])   . Alcoholism ([place])   . Cocaine abuse ([place])   . Blurry vision, bilateral   . HSV-1 (herpes simplex virus 1) infection   . HSV-2 (herpes simplex virus 2) infection   .  Congestive heart disease ([place])   . Essential hypertension   . DM w/o complication type II ([place])   . Hypothermia 10/24/2014  . Near syncope   . Weakness   . Dyspnea on exertion   . Palpitation   . Alcohol abuse   . Genital HSV   . ETOH abuse 07/28/2011  . Pulmonary nodule 05/30/2011  . Pancreatitis 05/28/2011  . Diabetes ([place]) 05/28/2011   Social History   Socioeconomic History  . Marital status: Single    Spouse name: Not on file  . Number of children: Not on file  . Years of education: Not on file  . Highest education level: Not on file  Social Needs  . Financial resource strain: Not on file  . Food insecurity - worry: Not on file  . Food insecurity - inability: Not on file  . Transportation needs - medical: Not on file  . Transportation needs - non-medical: Not on file  Occupational History  . Not on file  Tobacco Use  . Smoking status: Never Smoker  . Smokeless tobacco: Never Used  Substance and Sexual  Activity  . Alcohol use: Yes    Alcohol/week: 4.2 oz    Types: 3 Cans of beer, 4 Shots of liquor per week    Comment: couple times weekly  . Drug use: Yes    Types: Cocaine    Comment: pt reports last drug use X1 year ago   . Sexual activity: Not on file  Other Topics Concern  . Not on file  Social History Narrative  . Not on file    Melvin [name] family history includes Diabetes in his mother; Heart disease in his father; Heart failure in his father and mother; Hypertension in his mother.      Objective:    Vitals:   06/22/17 1504  Pulse: (!) 105  SpO2: 94%    Physical Exam  well-developed older African-American male, in no acute distress. He was dizzy with ambulation into the office. 2 nurses unable to hear a blood pressure. I believe I can hear systolic in the 70 range. Pulse 105. HEENT; nontraumatic normocephalic EOMI PERRLA, sclera anicteric, Cardiovascular; somewhat tacky regular rhythm with S1-S2 no murmur or gallop, Pulmonary; clear bilaterally,  Abdomen; soft, nontender nondistended bowel sounds are active there is no palpable mass or hepatosplenomegaly, Rectal ;exam not done, Extremities; patient has a PICC line in the right upper extremity. Neuropsych ;mood and affect appropriate       Assessment & Plan:   #55 57 year old African-American male with multiple medical problems currently on IV Rocephin/has PICC line in place and is being treated for an osteomyelitis of his Ulna who is seen in GI today for complaints of chronic diarrhea over the past 4-5 months. Vitals today patient is tachycardic in the low 937T and systolic blood pressure unable to hear with cuff and palpable in the low 02I systolic. I'm not convinced his hypotension and tachycardia are secondary to diarrhea which is been chronic and doesn't sound any worse than it has been for several months, though patient has diagnosis of pancreatic insufficiency he will also need to be ruled out for C. difficile as he has been on IV antibiotics. I'm also concerned that he may be developing sepsis with hypotension-rule out PICC line infection rule out bacteremia  #2 insulin-dependent diabetes mellitus #3 congestive heart failure #4 history of hypertension no not currently on any medications #5 history of polysubstance abuse EtOH and IV drug abuse, I believe drug abuses inactive #6 diverticulosis #7 history of adenomatous colon polyps recent colonoscopy September 2018  Plan; patient will need to be evaluated in the emergency room, his daughter has come to pick him up and will take [facility]. I suspect he will need to be admitted, and will need stool cultures etc. as part of his workup.  For his chronic diarrhea felt secondary to pancreatic insufficiency we will increase 7 Pap from 20,000 units with each meal to 25,000 units with each meal, will also add Imodium one by mouth every morning and may take a second dose midday when necessary He will need follow-up in the office with  Dr. Havery Moros or myself in 3-4 weeks.  Amy S Esterwood PA-C 06/22/2017   Cc: Arnoldo Morale, MD

## 2017-06-22 NOTE — ED Notes (Addendum)
Wound on left arm re-wrapped, brace reapplied

## 2017-06-22 NOTE — ED Triage Notes (Signed)
Per Pt, Pt is coming from GI Md with hypotension. Pt was seen for loss of control of bowels and noted to have hypotension during assessment. Alert and Oriented x4 with no Neuro deficits noted upon evaluation. Reports some dizziness.

## 2017-06-22 NOTE — Patient Instructions (Addendum)
Use Imodium one every AM then second dose later in the day if needed.  We are increasing the dose of the Zenpep to 25,000 units 30 min before each meal.  After you are released from the hospital, follow up with Nicoletta Ba PA or Dr. Reading Cellar.

## 2017-06-22 NOTE — ED Notes (Signed)
Please note: only ONE set of Blood Cultures collected. Pt has restricted limb (RA).

## 2017-06-22 NOTE — ED Provider Notes (Signed)
Patient placed in Quick Look pathway, seen and evaluated   Chief Complaint: Dizziness  HPI: 57 year old male with a past medical history of osteomyelitis, type 2 diabetes, CHF, hypertension presents today with complaints of dizziness.  Patient reports that he woke up this morning and was feeling slightly dizzy, he notes that this is did not related to positioning, notes that he can be up and moving around and feel extremely weak and dizzy.  He denies any associated neurological deficits, denies any fever at home.  Patient denies any chest pain or shortness of breath, denies abdominal pain nausea or vomiting.  Patient notes that he does have an infection in the left arm for which she is receiving IV antibiotics.  ROS: Negative for chest pain, negative for fever, negative for nausea or vomiting, positive for dizziness (one)  Physical Exam:   Gen: No distress  Neuro: Awake and Alert  Skin: Warm    Focused Exam: Neurologic-cranial nerves are intact cardiac regular rate and rhythm-abdomen soft nontender   Initiation of care has begun. The patient has been counseled on the process, plan, and necessity for staying for the completion/evaluation, and the remainder of the medical screening examination    Okey Regal, Hershal Coria 06/22/17 1655    Isla Pence, MD 06/22/17 1701

## 2017-06-22 NOTE — ED Provider Notes (Signed)
[facility] EMERGENCY DEPARTMENT Provider Note   CSN: [id number] Arrival date & time: 06/22/17  1628     History   Chief Complaint Chief Complaint  Patient presents with  . Hypotension    HPI BRAX WALEN is a 57 y.o. male.   Dizziness  Quality:  Lightheadedness Severity:  Moderate Onset quality:  Sudden Duration:  6 hours Timing:  Constant Progression:  Resolved Chronicity:  New Context comment:  Chronic diarrhea, osteomyelitis on abx Relieved by:  None tried Ineffective treatments:  None tried Associated symptoms: diarrhea   Associated symptoms: no blood in stool, no chest pain, no nausea, no palpitations, no shortness of breath, no syncope, no tinnitus and no vomiting     Past Medical History:  Diagnosis Date  . Anemia   . Angina   . CHF (congestive heart failure) ([place])   . Diabetes mellitus   . GERD (gastroesophageal reflux disease)   . Hyperlipidemia   . Meningitis   . Neuromuscular disorder ([place])    diabetic neuropathy  . Pancreatitis   . Pneumonia     Patient Active Problem List   Diagnosis Date Noted  . Skin ulcer of left lower leg, limited to breakdown of skin ([place])   . Streptococcus viridans infection 06/14/2017  . Diabetes mellitus type 2, insulin dependent ([place])   . Diabetic ulcer of ankle ([place]) 06/11/2017  . Diabetic ulcer of left wrist ([place]) 06/11/2017  . Cellulitis of left forearm   . Abscess 06/10/2017  . Osteomyelitis ([place]) 06/10/2017  . Pancreatic insufficiency 02/13/2017  . AKI (acute kidney injury) ([place]) 06/18/2016  . Diabetes mellitus without complication ([place]) 03/00/9233  . Chronic diastolic CHF (congestive heart failure) ([place]) 06/18/2016  . Diarrhea 06/18/2016  . Unilateral primary osteoarthritis, left knee 04/23/2016  . Hyperlipidemia 11/14/2014  . Erectile dysfunction 11/14/2014  . Diabetic neuropathy ([place]) 10/31/2014  . Hypoglycemia associated with diabetes ([place])   . Alcoholism ([place])   . Cocaine  abuse ([place])   . Blurry vision, bilateral   . HSV-1 (herpes simplex virus 1) infection   . HSV-2 (herpes simplex virus 2) infection   . Congestive heart disease ([place])   . Essential hypertension   . DM w/o complication type II ([place])   . Hypothermia 10/24/2014  . Near syncope   . Weakness   . Dyspnea on exertion   . Palpitation   . Alcohol abuse   . Genital HSV   . ETOH abuse 07/28/2011  . Pulmonary nodule 05/30/2011  . Pancreatitis 05/28/2011  . Diabetes ([place]) 05/28/2011    Past Surgical History:  Procedure Laterality Date  . ERCP  08/02/2011   Procedure: ENDOSCOPIC RETROGRADE CHOLANGIOPANCREATOGRAPHY (ERCP);  Surgeon: Beryle Beams, MD;  Location: [facility] ENDOSCOPY;  Service: Endoscopy;  Laterality: N/A;  . ERCP  08/22/2011   Procedure: ENDOSCOPIC RETROGRADE CHOLANGIOPANCREATOGRAPHY (ERCP);  Surgeon: Beryle Beams, MD;  Location: [facility] ENDOSCOPY;  Service: Endoscopy;  Laterality: N/A;  . I&D EXTREMITY Left 06/10/2017   Procedure: IRRIGATION AND DEBRIDEMENT EXTREMITY, left wrist;  Surgeon: Milly Jakob, MD;  Location: [facility];  Service: Orthopedics;  Laterality: Left;  . TONSILLECTOMY AND ADENOIDECTOMY     "as a kid"  . TYMPANOSTOMY TUBE PLACEMENT         Home Medications    Prior to Admission medications   Medication Sig Start Date End Date Taking? Authorizing Provider  aspirin 81 MG EC tablet Take 1 tablet (81 mg total) by mouth daily. 11/14/14  Yes Arnoldo Morale, MD  atorvastatin (LIPITOR) 10 MG tablet TAKE 1 TABLET EVERY DAY AT 6  P.M. Patient taking differently: Take 10 mg once a day at 6 PM 05/26/17  Yes Amao, Charlane Ferretti, MD  bacitracin ointment Apply topically 2 (two) times daily. 06/15/17  Yes Santos-Sanchez, Merlene Morse, MD  cefTRIAXone (ROCEPHIN) IVPB Inject 2 g into the vein daily. Indication: Strep viridans osteo Last Day of Therapy:  07/21/17 Labs - Once weekly:  CBC/D and BMP, Labs - Every other week:  ESR and CRP 06/15/17  Yes Santos-Sanchez, Idalys, MD  gabapentin (NEURONTIN)  300 MG capsule Take 2 capsules (600 mg total) by mouth 2 (two) times daily. 06/10/17  Yes Arnoldo Morale, MD  insulin aspart (NOVOLOG) 100 UNIT/ML injection 150-200, give 3 units 201-250 give 5 units 251-300 give 7 units 301-350 give 9 units 351-400 give 12 units Patient taking differently: Inject 3-12 Units into the skin See admin instructions. 3-12 units three times a day before meals PER SLIDING SCALE: BGL 150-200 = 3 units; 201-250 = 5 units; 251-300 = 7 units; 301-350 = 9 units; 351-400 = 12 units 06/10/17  Yes Amao, Enobong, MD  insulin glargine (LANTUS) 100 UNIT/ML injection Inject 0.3 mLs (30 Units total) into the skin at bedtime. 06/15/17  Yes Colbert Ewing, MD  naproxen (NAPROSYN) 500 MG tablet TAKE 1 TABLET (500 MG TOTAL) BY MOUTH 2 (TWO) TIMES DAILY WITH A MEAL. Patient taking differently: Take 500 mg by mouth 2 (two) times daily as needed (for pain).  04/28/17  Yes Amao, Charlane Ferretti, MD  Pancrelipase, Lip-Prot-Amyl, (ZENPEP) 25000-79000 units CPEP Take 25,000 Units by mouth 3 (three) times daily. Before each meal 06/22/17 07/22/17 Yes Esterwood, Amy S, PA-C  silver sulfADIAZINE (SILVADENE) 1 % cream Apply 1 application topically daily. Patient taking differently: Apply 1 application topically See admin instructions. 1 application to the left hand two times a day 11/11/16  Yes Arnoldo Morale, MD  ACCU-CHEK FASTCLIX LANCETS MISC Use as directed for 3 times daily testing of blood glucose. E11.9 10/09/16   Arnoldo Morale, MD  ACCU-CHEK SOFTCLIX LANCETS lancets Use as instructed for 3 times daily testing of blood sugar. E11.9 09/23/16   Arnoldo Morale, MD  Alcohol Swabs (B-D SINGLE USE SWABS REGULAR) PADS USE AS DIRECTED THREE TIMES DAILY  AND AT BEDTIME 12/30/16   Arnoldo Morale, MD  Blood Glucose Monitoring Suppl (ACCU-CHEK NANO SMARTVIEW) w/Device KIT Use as directed for 3 times daily testing of blood glucose. 10/09/16   Arnoldo Morale, MD  glucose blood (ACCU-CHEK SMARTVIEW) test strip Use as instructed for 3 times  daily testing of blood glucose. E11.9 10/09/16   Arnoldo Morale, MD  Insulin Syringe-Needle U-100 (B-D INS SYRINGE 0.5CC/31GX5/16) 31G X 5/16" 0.5 ML MISC USE TO INJECT INSULIN 3 TIMES A DAY AND AT BEDTIME 10/27/16   Arnoldo Morale, MD  Lancet Devices [facility]) lancets Use as instructed for 3 times daily testing of blood sugar. E11.9 09/23/16   Arnoldo Morale, MD    Family History Family History  Problem Relation Age of Onset  . Heart failure Mother   . Diabetes Mother   . Hypertension Mother   . Heart disease Father   . Heart failure Father   . Colon cancer Neg Hx   . Colon polyps Neg Hx   . Esophageal cancer Neg Hx   . Rectal cancer Neg Hx   . Stomach cancer Neg Hx     Social History Social History   Tobacco Use  . Smoking status: Never Smoker  . Smokeless  tobacco: Never Used  Substance Use Topics  . Alcohol use: Yes    Alcohol/week: 4.2 oz    Types: 3 Cans of beer, 4 Shots of liquor per week    Comment: couple times weekly  . Drug use: Yes    Types: Cocaine    Comment: pt reports last drug use X1 year ago      Allergies   Patient has no known allergies.   Review of Systems Review of Systems  Constitutional: Negative for chills and fever.  HENT: Negative for ear pain, sore throat and tinnitus.   Eyes: Negative for pain and visual disturbance.  Respiratory: Negative for cough and shortness of breath.   Cardiovascular: Negative for chest pain, palpitations and syncope.  Gastrointestinal: Positive for diarrhea. Negative for abdominal pain, blood in stool, nausea and vomiting.  Genitourinary: Negative for dysuria and hematuria.  Musculoskeletal: Negative for arthralgias and back pain.  Skin: Negative for color change and rash.  Neurological: Positive for dizziness. Negative for seizures and syncope.  All other systems reviewed and are negative.    Physical Exam Updated Vital Signs BP 102/64   Pulse 91   Temp 98.5 F (36.9 C) (Oral)   Resp 19   Ht 5'  8" (1.727 m)   Wt 73.5 kg (162 lb)   SpO2 100%   BMI 24.63 kg/m   Physical Exam  Constitutional: He appears well-developed and well-nourished.  HENT:  Head: Normocephalic and atraumatic.  Eyes: Conjunctivae and EOM are normal.  Neck: Normal range of motion. Neck supple.  Cardiovascular: Normal rate and regular rhythm.  No murmur heard. Tachycardia resolved at time of my exam.  Pulmonary/Chest: Effort normal and breath sounds normal. No respiratory distress.  Abdominal: Soft. There is no tenderness.  Musculoskeletal: He exhibits no edema.  Neurological: He is alert.  Skin: Skin is warm and dry.  Psychiatric: He has a normal mood and affect.  Nursing note and vitals reviewed.    ED Treatments / Results  Labs (all labs ordered are listed, but only abnormal results are displayed) Labs Reviewed  CULTURE, BLOOD (ROUTINE X 2)  CULTURE, BLOOD (ROUTINE X 2)  BASIC METABOLIC PANEL  CBC  BRAIN NATRIURETIC PEPTIDE  I-STAT TROPONIN, ED  CBG MONITORING, ED    EKG  EKG Interpretation  Date/Time:  Monday June 22 2017 16:39:49 EST Ventricular Rate:  94 PR Interval:  178 QRS Duration: 66 QT Interval:  372 QTC Calculation: 465 R Axis:   49 Text Interpretation:  Sinus rhythm with Premature atrial complexes Cannot rule out Anterior infarct , age undetermined Abnormal ECG When cpompared to prior, no significant changes seen.  No STEMI Confirmed by Antony Blackbird [phone]) on 06/22/2017 5:36:52 PM       Radiology No results found.  Procedures Procedures (including critical care time)  Medications Ordered in ED Medications  sodium chloride 0.9 % bolus 1,000 mL (1,000 mLs Intravenous New Bag/Given 06/22/17 1756)     Initial Impression / Assessment and Plan / ED Course  I have reviewed the triage vital signs and the nursing notes.  Pertinent labs & imaging results that were available during my care of the patient were reviewed by me and considered in my medical decision  making (see chart for details).     Mr. Bahl is a 57 year old male with past medical history significant for osteomyelitis, chronic diarrhea, pancreatitis, CHF who presents for dizziness.  Patient was seen by his gastroenterologist for follow-up on his chronic diarrhea.  His vitals  demonstrated hypotension and he was sent to the emergency department.  Patient is afebrile and without complaints of shortness of breath, chest pain, or infective symptoms.  Labs are significant for elevated creatinine, not consistent with AKI; hemoconcentration, negative troponin, normal BNP, normal lactic acid.  Patient is given IV fluids with resolution of symptoms.  Chest x-ray obtained, personally reviewed by me, demonstrates no acute cardiac or pulmonary processes.  Extensive discussion had with patient regarding the benefits admission for observation and further workup of his hypertension.  Patient expresses desire to go home and through shared decision making plans to go home with strict return precautions and good PCP follow-up.  Final Clinical Impressions(s) / ED Diagnoses   Final diagnoses:  Dehydration  Diarrhea, unspecified type    ED Discharge Orders    None       Elveria Rising, MD 06/22/17 2350    Tegeler, Gwenyth Allegra, MD 06/23/17 1743

## 2017-06-23 ENCOUNTER — Other Ambulatory Visit: Payer: Self-pay

## 2017-06-23 DIAGNOSIS — M86132 Other acute osteomyelitis, left radius and ulna: Secondary | ICD-10-CM | POA: Diagnosis not present

## 2017-06-23 DIAGNOSIS — E1165 Type 2 diabetes mellitus with hyperglycemia: Secondary | ICD-10-CM | POA: Diagnosis not present

## 2017-06-23 DIAGNOSIS — E1169 Type 2 diabetes mellitus with other specified complication: Secondary | ICD-10-CM | POA: Diagnosis not present

## 2017-06-23 DIAGNOSIS — L98492 Non-pressure chronic ulcer of skin of other sites with fat layer exposed: Secondary | ICD-10-CM | POA: Diagnosis not present

## 2017-06-23 DIAGNOSIS — M86639 Other chronic osteomyelitis, unspecified radius and ulna: Secondary | ICD-10-CM | POA: Diagnosis not present

## 2017-06-23 DIAGNOSIS — K861 Other chronic pancreatitis: Secondary | ICD-10-CM | POA: Diagnosis not present

## 2017-06-23 DIAGNOSIS — E1140 Type 2 diabetes mellitus with diabetic neuropathy, unspecified: Secondary | ICD-10-CM | POA: Diagnosis not present

## 2017-06-23 DIAGNOSIS — E11622 Type 2 diabetes mellitus with other skin ulcer: Secondary | ICD-10-CM | POA: Diagnosis not present

## 2017-06-23 DIAGNOSIS — I110 Hypertensive heart disease with heart failure: Secondary | ICD-10-CM | POA: Diagnosis not present

## 2017-06-23 DIAGNOSIS — L97222 Non-pressure chronic ulcer of left calf with fat layer exposed: Secondary | ICD-10-CM | POA: Diagnosis not present

## 2017-06-23 NOTE — Patient Outreach (Signed)
[facility]) Care Management  06/23/2017  KIREN MCISAAC 01-Aug-1960 [id number]   Transition of care  Referral date: 06/19/17 Referral source: discharged [place] on 06/15/17 Insurance: [employer]   Telephone call to patient regarding transition of care follow up. HIPAA verified with patient. Patient states he is currently in the hospital. Patient confirms he was admitted to the hospital on yesterday.   PLAN;:  RNCM will send message to [facility] hospital liaisons notifying them of patients readmission.  Will request liaison discuss and offer [facility] care management services to patient.  RNCM will await update on patients discharge status.   Quinn Plowman RN,BSN,CCM [facility] Telephonic  [phone]

## 2017-06-24 ENCOUNTER — Other Ambulatory Visit: Payer: Self-pay

## 2017-06-24 NOTE — Patient Outreach (Signed)
[facility]) Care Management  06/24/2017  [name] 1961/06/06 [id number]  .Transition of care  Referral date:06/19/17 Referral source:discharged from hospital 06/15/17 Insurance:[employer]   Telephone call to patient regarding transition of care follow up. HIPAA verified with patient. Patient states he was recently admitted to the hospital  06/10/17/ to 06/15/17 for infection in his left hand and left foot. Patient states he has home IV antibiotic therapy and physical therapy with [facility]. Patient states he was in the emergency room on 06/22/17 due to a low blood pressure and dehydration. Patient states he is feeling very tired.   Patient states his daughter, Sevag Shearn assists him with his care. Patient states he and his daughter do the dressing changes to his left hand and foot. Patient states his daughter does his IV antibiotic. Patient states the home health nurse comes 1 time per week and his physical therapy is 2 times per week.  RNCM discussed signs/ symptoms of infection. Advised patient to call his doctor for these symptoms.  Patient states he has all of his medication and takes as prescribed. Patient reports he saw his primary MD last on 06/10/17.  RNCM discussed with patient need to schedule follow up visit with primary MD.  Patient verbalized understanding. Patient states he has a follow up with the surgeon in February 2019.   Patient reports his blood sugars are running between 150 and 200 fasting and  200's in the evening. Patient states his most recent A1c 10.  RNCM discussed ongoing transition of care follow up with patient. RNCM discussed and offered patient [facility] care management services. Patient verbally agreed to ongoing transition of care follow up with community case manager.  RNCM advised patient to notify MD of any changes in condition prior to scheduled appointment. RNCM provided contact name and number: [phone] or main office number  [phone] and 24 hour nurse advise line [phone]. Patient requested I call back and leave this information on his voicemail.  RNCM verified patient aware of 911 services for urgent/ emergent needs.  ASSESSMENT;  Per patients medical records: Date of Admission: 06/10/2017  Date of Discharge: 06/15/2017  Discharge Diagnosis: 1. Osteomyelitis of ulna  2. Diabetic ulcer in L ankle  3. Insulin-dependent T2DM   Active Problems:   Osteomyelitis (HCC)   Diabetic ulcer of ankle (HCC)   Diabetic ulcer of left wrist (HCC)   Diabetes mellitus type 2, insulin dependent ([place])   Streptococcus viridans infection   Skin ulcer of left lower leg, limited to breakdown of skin [facility] ED visit on 06/23/27 for dehydration, diarrhea, hypotension   PLAN; RNCM will refer patient to community case manager.   Quinn Plowman RN,BSN,CCM [facility] Telephonic  [phone]

## 2017-06-25 DIAGNOSIS — A491 Streptococcal infection, unspecified site: Secondary | ICD-10-CM | POA: Diagnosis not present

## 2017-06-25 DIAGNOSIS — E1140 Type 2 diabetes mellitus with diabetic neuropathy, unspecified: Secondary | ICD-10-CM | POA: Diagnosis not present

## 2017-06-25 DIAGNOSIS — L98492 Non-pressure chronic ulcer of skin of other sites with fat layer exposed: Secondary | ICD-10-CM | POA: Diagnosis not present

## 2017-06-25 DIAGNOSIS — M869 Osteomyelitis, unspecified: Secondary | ICD-10-CM | POA: Diagnosis not present

## 2017-06-25 DIAGNOSIS — E1165 Type 2 diabetes mellitus with hyperglycemia: Secondary | ICD-10-CM | POA: Diagnosis not present

## 2017-06-25 DIAGNOSIS — E11622 Type 2 diabetes mellitus with other skin ulcer: Secondary | ICD-10-CM | POA: Diagnosis not present

## 2017-06-25 DIAGNOSIS — K861 Other chronic pancreatitis: Secondary | ICD-10-CM | POA: Diagnosis not present

## 2017-06-25 DIAGNOSIS — M86132 Other acute osteomyelitis, left radius and ulna: Secondary | ICD-10-CM | POA: Diagnosis not present

## 2017-06-25 DIAGNOSIS — L97222 Non-pressure chronic ulcer of left calf with fat layer exposed: Secondary | ICD-10-CM | POA: Diagnosis not present

## 2017-06-25 DIAGNOSIS — K859 Acute pancreatitis without necrosis or infection, unspecified: Secondary | ICD-10-CM | POA: Diagnosis not present

## 2017-06-25 DIAGNOSIS — E1169 Type 2 diabetes mellitus with other specified complication: Secondary | ICD-10-CM | POA: Diagnosis not present

## 2017-06-25 DIAGNOSIS — I110 Hypertensive heart disease with heart failure: Secondary | ICD-10-CM | POA: Diagnosis not present

## 2017-06-26 ENCOUNTER — Other Ambulatory Visit: Payer: Self-pay | Admitting: *Deleted

## 2017-06-26 NOTE — Telephone Encounter (Signed)
This encounter was created in error - please disregard.

## 2017-06-26 NOTE — Patient Outreach (Addendum)
[place] [facility]) Care Management  06/26/2017  JAVIUS SYLLA 1961-05-26 [id number]   Report received from telephonic care manager for transition of care and assessment of home needs/assistance with chronic medical conditions.  He was recently discharged from hospital, initial transition of care call placed by D. Green.  Call placed to member, identity verified.  This care manger introduced self and purpose of call.  He agrees to community involvement, home visit within the next 2 weeks.  He denies any urgent concerns, state his daughter monitors his blood pressure, state it has been "normal."  Will follow up with weekly transition of care call next week.   [facility] CM Care Plan Problem One     Most Recent Value  Care Plan Problem One  Risk for readmission related to complications of diabetes as evidence by recent admission for nonhealing wound (osteomylitis)  Role Documenting the Problem One  Care Management [place] for Problem One  Active  [facility] Long Term Goal   Member will not be readmitted to hospital within 31 days of discharge  [facility] Long Term Goal Start Date  06/26/17  Interventions for Problem One Long Term Goal  Discussed with member the importance of following discharge instructions, including follow up appointments, medications, diet, and home health involvement, to decrease the risk of readmission  [facility] CM Short Term Goal #1   Member will repot taking medications as instructed over the next 4 weeks  [facility] CM Short Term Goal #1 Start Date  06/26/17  Interventions for Short Term Goal #1  Medication list reviewed per discharge instructions.  [facility] CM Short Term Goal #2   Member will report follow up with primary MD within the next 2 weeks  [facility] CM Short Term Goal #2 Start Date  06/26/17  Interventions for Short Term Goal #2  Verified with member appointment is scheduled.  Confirms he has transportation.       [name], [place], MSN [place] [phone]

## 2017-06-27 LAB — CULTURE, BLOOD (ROUTINE X 2)
Culture: NO GROWTH
Culture: NO GROWTH
Special Requests: ADEQUATE
Special Requests: ADEQUATE

## 2017-06-29 DIAGNOSIS — E1169 Type 2 diabetes mellitus with other specified complication: Secondary | ICD-10-CM | POA: Diagnosis not present

## 2017-06-29 DIAGNOSIS — K861 Other chronic pancreatitis: Secondary | ICD-10-CM | POA: Diagnosis not present

## 2017-06-29 DIAGNOSIS — E11622 Type 2 diabetes mellitus with other skin ulcer: Secondary | ICD-10-CM | POA: Diagnosis not present

## 2017-06-29 DIAGNOSIS — M86639 Other chronic osteomyelitis, unspecified radius and ulna: Secondary | ICD-10-CM | POA: Diagnosis not present

## 2017-06-29 DIAGNOSIS — E1165 Type 2 diabetes mellitus with hyperglycemia: Secondary | ICD-10-CM | POA: Diagnosis not present

## 2017-06-29 DIAGNOSIS — L98492 Non-pressure chronic ulcer of skin of other sites with fat layer exposed: Secondary | ICD-10-CM | POA: Diagnosis not present

## 2017-06-29 DIAGNOSIS — L97222 Non-pressure chronic ulcer of left calf with fat layer exposed: Secondary | ICD-10-CM | POA: Diagnosis not present

## 2017-06-29 DIAGNOSIS — M86132 Other acute osteomyelitis, left radius and ulna: Secondary | ICD-10-CM | POA: Diagnosis not present

## 2017-06-29 DIAGNOSIS — I110 Hypertensive heart disease with heart failure: Secondary | ICD-10-CM | POA: Diagnosis not present

## 2017-06-29 DIAGNOSIS — E1140 Type 2 diabetes mellitus with diabetic neuropathy, unspecified: Secondary | ICD-10-CM | POA: Diagnosis not present

## 2017-06-30 DIAGNOSIS — I110 Hypertensive heart disease with heart failure: Secondary | ICD-10-CM | POA: Diagnosis not present

## 2017-06-30 DIAGNOSIS — L98492 Non-pressure chronic ulcer of skin of other sites with fat layer exposed: Secondary | ICD-10-CM | POA: Diagnosis not present

## 2017-06-30 DIAGNOSIS — E11622 Type 2 diabetes mellitus with other skin ulcer: Secondary | ICD-10-CM | POA: Diagnosis not present

## 2017-06-30 DIAGNOSIS — E1165 Type 2 diabetes mellitus with hyperglycemia: Secondary | ICD-10-CM | POA: Diagnosis not present

## 2017-06-30 DIAGNOSIS — E1169 Type 2 diabetes mellitus with other specified complication: Secondary | ICD-10-CM | POA: Diagnosis not present

## 2017-06-30 DIAGNOSIS — E1140 Type 2 diabetes mellitus with diabetic neuropathy, unspecified: Secondary | ICD-10-CM | POA: Diagnosis not present

## 2017-06-30 DIAGNOSIS — L97222 Non-pressure chronic ulcer of left calf with fat layer exposed: Secondary | ICD-10-CM | POA: Diagnosis not present

## 2017-06-30 DIAGNOSIS — M86132 Other acute osteomyelitis, left radius and ulna: Secondary | ICD-10-CM | POA: Diagnosis not present

## 2017-06-30 DIAGNOSIS — K861 Other chronic pancreatitis: Secondary | ICD-10-CM | POA: Diagnosis not present

## 2017-07-01 ENCOUNTER — Encounter: Payer: Self-pay | Admitting: Family Medicine

## 2017-07-01 ENCOUNTER — Ambulatory Visit: Payer: Medicare HMO | Attending: Family Medicine | Admitting: Family Medicine

## 2017-07-01 VITALS — BP 149/75 | HR 83 | Temp 97.7°F | Ht 68.0 in | Wt 174.0 lb

## 2017-07-01 DIAGNOSIS — E1140 Type 2 diabetes mellitus with diabetic neuropathy, unspecified: Secondary | ICD-10-CM | POA: Insufficient documentation

## 2017-07-01 DIAGNOSIS — Z7982 Long term (current) use of aspirin: Secondary | ICD-10-CM | POA: Diagnosis not present

## 2017-07-01 DIAGNOSIS — K219 Gastro-esophageal reflux disease without esophagitis: Secondary | ICD-10-CM | POA: Diagnosis not present

## 2017-07-01 DIAGNOSIS — I10 Essential (primary) hypertension: Secondary | ICD-10-CM

## 2017-07-01 DIAGNOSIS — K8689 Other specified diseases of pancreas: Secondary | ICD-10-CM | POA: Diagnosis not present

## 2017-07-01 DIAGNOSIS — Z79899 Other long term (current) drug therapy: Secondary | ICD-10-CM | POA: Insufficient documentation

## 2017-07-01 DIAGNOSIS — L02414 Cutaneous abscess of left upper limb: Secondary | ICD-10-CM | POA: Diagnosis present

## 2017-07-01 DIAGNOSIS — E785 Hyperlipidemia, unspecified: Secondary | ICD-10-CM | POA: Insufficient documentation

## 2017-07-01 DIAGNOSIS — I110 Hypertensive heart disease with heart failure: Secondary | ICD-10-CM | POA: Insufficient documentation

## 2017-07-01 DIAGNOSIS — E119 Type 2 diabetes mellitus without complications: Secondary | ICD-10-CM

## 2017-07-01 DIAGNOSIS — Z794 Long term (current) use of insulin: Secondary | ICD-10-CM | POA: Diagnosis not present

## 2017-07-01 DIAGNOSIS — M86132 Other acute osteomyelitis, left radius and ulna: Secondary | ICD-10-CM | POA: Insufficient documentation

## 2017-07-01 DIAGNOSIS — E1169 Type 2 diabetes mellitus with other specified complication: Secondary | ICD-10-CM | POA: Insufficient documentation

## 2017-07-01 DIAGNOSIS — I509 Heart failure, unspecified: Secondary | ICD-10-CM | POA: Diagnosis not present

## 2017-07-01 DIAGNOSIS — K860 Alcohol-induced chronic pancreatitis: Secondary | ICD-10-CM | POA: Insufficient documentation

## 2017-07-01 LAB — GLUCOSE, POCT (MANUAL RESULT ENTRY): POC Glucose: 140 mg/dl — AB (ref 70–99)

## 2017-07-01 NOTE — Patient Instructions (Signed)
Bone and Joint Infections, Adult Bone infections (osteomyelitis) and joint infections (septic arthritis) occur when bacteria or other germs get inside a bone or a joint. This can happen if you have an infection in another part of your body that spreads through your blood. Germs from your skin or from outside of your body can also cause this type of infection if you have a wound or a broken bone (fracture) that breaks the skin. Anyone can get a bone infection or joint infection. You may be more likely to get this type of infection if you have a condition, such as diabetes, that lowers your ability to fight infection or increases your chances of getting an infection. Bone and joint infections can cause damage, and they can spread to other areas of your body. They need to be treated quickly. What are the causes? Most bone and joint infections are caused by bacteria. They can also be caused by other germs, such as viruses and funguses. What increases the risk? This condition is more likely to develop in:  People who recently had surgery, especially bone or joint surgery.  People who have a long-term (chronic) disease, such as: ? HIV (human immunodeficiency virus). ? Diabetes. ? Rheumatoid arthritis. ? Sickle cell anemia.  Elderly people.  People who take medicines that block or weaken the body's defense system (immune system).  People who have a condition that reduces their blood flow.  People who are on kidney dialysis.  People who have an artificial joint.  People who have had a joint or bone repaired with plates or screws (surgical hardware).  People who use or abuse IV drugs.  People who have had trauma, such as stepping on a nail.  What are the signs or symptoms? Symptoms vary depending on the type and location of your infection. Common symptoms of bone and joint infections include:  Fever and chills.  Redness and warmth.  Swelling.  Pain and stiffness.  Drainage of fluid  or pus near the infection.  Weight loss and fatigue.  Decreased ability to use a hand or foot.  How is this diagnosed? This condition may be diagnosed based on symptoms, medical history, a physical exam, and diagnostic tests. Tests can help to identify the cause of the infection. You may have various tests, such as:  A sample of tissue, fluid, or blood taken to be examined under a microscope.  A procedure to remove fluid from the infected joint with a needle (joint aspiration) for testing in a lab.  Pus or discharge swabbed from a wound for testing to identify germs and to determine what type of medicine will kill them (culture and sensitivity).  Blood tests to look for evidence of infection and inflammation (biomarkers).  Imaging studies to determine how severe the bone or joint infection is. These may include: ? X-rays. ? CT scan. ? MRI. ? Bone scan.  How is this treated? Treatment depends on the cause and type of infection. Antibiotic medicines are usually the first treatment for a bone or joint infection. Treatment with antibiotics may include:  Getting IV antibiotics. This may be done in a hospital at first. You may have to continue IV antibiotics at home for several weeks. You may also have to take antibiotics by mouth for several weeks after that.  Taking more than one kind of antibiotic. Treatment may start with a type of antibiotic that works against many different bacteria (broad spectrumantibiotics). IV antibiotics may be changed if tests show that another   type may work better.  Other treatments may include:  Draining fluid from the joint by placing a needle into it (aspiration).  Surgery to remove: ? Dead or dying tissue from a bone or joint. ? An infected artificial joint. ? Infected plates or screws that were used to repair a broken bone.  Follow these instructions at home:  Take medicines only as directed by your health care provider.  Take your antibiotic  medicine as directed by your health care provider. Finish the antibiotic even if you start to feel better.  Follow instructions from your health care provider about how to take IV antibiotics at home.  Ask your health care provider if you have any restrictions on your activities.  Keep all follow-up visits as directed by your health care provider. This is important. Contact a health care provider if:  You have a fever or chills.  You have redness, warmth, pain, or swelling that returns after treatment. Get help right away if:  You have rapid breathing or you have trouble breathing.  You have chest pain.  You cannot drink fluids or make urine.  The affected arm or leg swells, changes color, or turns blue. This information is not intended to replace advice given to you by your health care provider. Make sure you discuss any questions you have with your health care provider. Document Released: 05/26/2005 Document Revised: 11/01/2015 Document Reviewed: 05/24/2014 Elsevier Interactive Patient Education  2018 Elsevier Inc.  

## 2017-07-01 NOTE — Progress Notes (Signed)
Pt has questions about ZENPEP medication.

## 2017-07-01 NOTE — Progress Notes (Signed)
Subjective:  Patient ID: Melvin [name], male    DOB: [birth year]  Age: 57 y.o. MRN: [id number]  CC: Abscess   HPI Melvin [name] is a 57 year old male with history of type 2 diabetes mellitus (A1c 9.0), diabetic neuropathy, chronic alcoholic pancreatitis, hyperlipidemia, hypertension who presents today for follow-up visit after hospitalization for Osteomyelitis of the left ulnar from 06/10/17 through 06/15/17.  He was referred to the ED from the clinic due to left forearm abscess; xray of left wrist was consistent with Osteomyelitis and he underwent  Irrigation and debridement by Orthopedics. Wound culture revealed Strept Viridans; PICC line was placed and he was commenced on IV Rocephin. He did have a post op fever and urine, blood cultures, CXR were negative. He subsequently discharged to continue antibiotics till 07/21/17.  After discharge he has a GI visit where he was switched from Creon to Zenpep for pancreatic insufficiency but was referred to the ED due to hypotension to rule out sepsis. He was managed for dehydration at the ED, diagnostics were unremarkable and he was discharged. He presents today and has been compliant with his antibiotics, performing daily wound dressing changes and has a HH RN coming to his home. His follow up with Orthopedics is on 07/21/17.  With regards to his Diabetes fasting sugars have been in the 120s, random sugars <200 and he has had one hypoglycemic episode of 60. Past Medical History:  Diagnosis Date  . Anemia   . Angina   . CHF (congestive heart failure) ([place])   . Diabetes mellitus   . GERD (gastroesophageal reflux disease)   . Hyperlipidemia   . Meningitis   . Neuromuscular disorder ([place])    diabetic neuropathy  . Pancreatitis   . Pneumonia     Past Surgical History:  Procedure Laterality Date  . ERCP  08/02/2011   Procedure: ENDOSCOPIC RETROGRADE CHOLANGIOPANCREATOGRAPHY (ERCP);  Surgeon: Beryle Beams, MD;  Location: [facility] ENDOSCOPY;   Service: Endoscopy;  Laterality: N/A;  . ERCP  08/22/2011   Procedure: ENDOSCOPIC RETROGRADE CHOLANGIOPANCREATOGRAPHY (ERCP);  Surgeon: Beryle Beams, MD;  Location: [facility] ENDOSCOPY;  Service: Endoscopy;  Laterality: N/A;  . I&D EXTREMITY Left 06/10/2017   Procedure: IRRIGATION AND DEBRIDEMENT EXTREMITY, left wrist;  Surgeon: Milly Jakob, MD;  Location: [place];  Service: Orthopedics;  Laterality: Left;  . TONSILLECTOMY AND ADENOIDECTOMY     "as a kid"  . TYMPANOSTOMY TUBE PLACEMENT      No Known Allergies   Outpatient Medications Prior to Visit  Medication Sig Dispense Refill  . ACCU-CHEK FASTCLIX LANCETS MISC Use as directed for 3 times daily testing of blood glucose. E11.9 100 each 12  . ACCU-CHEK SOFTCLIX LANCETS lancets Use as instructed for 3 times daily testing of blood sugar. E11.9 100 each 12  . Alcohol Swabs (B-D SINGLE USE SWABS REGULAR) PADS USE AS DIRECTED THREE TIMES DAILY  AND AT BEDTIME 400 each 3  . aspirin 81 MG EC tablet Take 1 tablet (81 mg total) by mouth daily. 30 tablet 3  . atorvastatin (LIPITOR) 10 MG tablet TAKE 1 TABLET EVERY DAY AT 6  P.M. (Patient taking differently: Take 10 mg once a day at 6 PM) 90 tablet 0  . bacitracin ointment Apply topically 2 (two) times daily. 120 g 2  . Blood Glucose Monitoring Suppl (ACCU-CHEK NANO SMARTVIEW) w/Device KIT Use as directed for 3 times daily testing of blood glucose. 1 kit 0  . cefTRIAXone (ROCEPHIN) IVPB Inject 2 g into the vein daily.  Indication: Strep viridans osteo Last Day of Therapy:  07/21/17 Labs - Once weekly:  CBC/D and BMP, Labs - Every other week:  ESR and CRP 37 Units 0  . gabapentin (NEURONTIN) 300 MG capsule Take 2 capsules (600 mg total) by mouth 2 (two) times daily. 120 capsule 3  . glucose blood (ACCU-CHEK SMARTVIEW) test strip Use as instructed for 3 times daily testing of blood glucose. E11.9 100 each 12  . insulin aspart (NOVOLOG) 100 UNIT/ML injection 150-200, give 3 units 201-250 give 5 units 251-300  give 7 units 301-350 give 9 units 351-400 give 12 units (Patient taking differently: Inject 3-12 Units into the skin See admin instructions. 3-12 units three times a day before meals PER SLIDING SCALE: BGL 150-200 = 3 units; 201-250 = 5 units; 251-300 = 7 units; 301-350 = 9 units; 351-400 = 12 units) 3 vial 3  . insulin glargine (LANTUS) 100 UNIT/ML injection Inject 0.3 mLs (30 Units total) into the skin at bedtime. 10 mL 3  . Insulin Syringe-Needle U-100 (B-D INS SYRINGE 0.5CC/31GX5/16) 31G X 5/16" 0.5 ML MISC USE TO INJECT INSULIN 3 TIMES A DAY AND AT BEDTIME 360 each 3  . Lancet Devices (ACCU-CHEK SOFTCLIX) lancets Use as instructed for 3 times daily testing of blood sugar. E11.9 1 each 0  . naproxen (NAPROSYN) 500 MG tablet TAKE 1 TABLET (500 MG TOTAL) BY MOUTH 2 (TWO) TIMES DAILY WITH A MEAL. (Patient taking differently: Take 500 mg by mouth 2 (two) times daily as needed (for pain). ) 60 tablet 0  . Pancrelipase, Lip-Prot-Amyl, (ZENPEP) 25000-79000 units CPEP Take 25,000 Units by mouth 3 (three) times daily. Before each meal 270 capsule 6  . silver sulfADIAZINE (SILVADENE) 1 % cream Apply 1 application topically daily. (Patient taking differently: Apply 1 application topically See admin instructions. 1 application to the left hand two times a day) 50 g 1   No facility-administered medications prior to visit.     ROS Review of Systems  Constitutional: Negative for activity change and appetite change.  HENT: Negative for sinus pressure and sore throat.   Eyes: Negative for visual disturbance.  Respiratory: Negative for cough, chest tightness and shortness of breath.   Cardiovascular: Negative for chest pain and leg swelling.  Gastrointestinal: Negative for abdominal distention, abdominal pain, constipation and diarrhea.  Endocrine: Negative.   Genitourinary: Negative for dysuria.  Musculoskeletal: Negative for joint swelling and myalgias.  Skin: Negative for rash.  Allergic/Immunologic:  Negative.   Neurological: Negative for weakness, light-headedness and numbness.  Psychiatric/Behavioral: Negative for dysphoric mood and suicidal ideas.    Objective:  BP (!) 149/75   Pulse 83   Temp 97.7 F (36.5 C) (Oral)   Ht 5' 8"  (1.727 m)   Wt 174 lb (78.9 kg)   SpO2 99%   BMI 26.46 kg/m   BP/Weight 07/01/2017 06/22/2017 4/78/2956  Systolic BP 213 086 70  Diastolic BP 75 85 40  Wt. (Lbs) 174 162 162  BMI 26.46 24.63 24.63      Physical Exam  Constitutional: He is oriented to person, place, and time. He appears well-developed and well-nourished.  Cardiovascular: Normal rate, normal heart sounds and intact distal pulses.  No murmur heard. Pulmonary/Chest: Effort normal and breath sounds normal. He has no wheezes. He has no rales. He exhibits no tenderness.  Abdominal: Soft. Bowel sounds are normal. He exhibits no distension and no mass. There is no tenderness.  Musculoskeletal: Normal range of motion.  Ulnar aspect of left forearm  with surgical incision and anterior aspect with open wound and minimal drainage  Neurological: He is alert and oriented to person, place, and time.  Psychiatric: He has a normal mood and affect.      Lab Results  Component Value Date   HGBA1C 9.0 (H) 06/10/2017    Assessment & Plan:   1. Diabetes mellitus without complication ([place]) Uncontrolled with A1c of 9.0 Blood sugar log reveals improvement and so I will make no regimen changes today as he has labile sugars Counseled on Diabetic diet, my plate method, 286 minutes of moderate intensity exercise/week Keep blood sugar logs with fasting goals of 80-120 mg/dl, random of less than 180 and in the event of sugars less than 60 mg/dl or greater than 400 mg/dl please notify the clinic ASAP. It is recommended that you undergo annual eye exams and annual foot exams. Pneumonia vaccine is recommended. - POCT glucose (manual entry)  2. Other acute osteomyelitis of left ulna (HCC) Continue IV  Rocephin till 07/21/17 Dressing change performed in the clinic Keep upcoming appointment with hand surgeon  3. Essential hypertension Slightly elevated No regimen changes today as he has been hypotensive in the past Counseled on blood pressure goal of less than 130/80, low-sodium, DASH diet, medication compliance, 150 minutes of moderate intensity exercise per week. Discussed medication compliance, adverse effects.   4. Pancreatic insufficiency Recently commenced on Zenpep by GI Keep follow up appointment in 3 weeks   No orders of the defined types were placed in this encounter.   Follow-up: Return in about 3 months (around 09/29/2017) for Follow-up on diabetes mellitus.   Arnoldo Morale MD

## 2017-07-02 ENCOUNTER — Other Ambulatory Visit: Payer: Self-pay | Admitting: *Deleted

## 2017-07-02 NOTE — Patient Outreach (Signed)
[facility]) Care Management  07/02/2017  OLMAN YONO May 03, 1961 [id number]   Weekly transition of care call placed, member state he doesn't feel well, does not feel to talking at this time.  He denies any urgent concerns needing emergent medical attention.  He confirms home visit scheduled for next week, will proceed with home visit next week.  [name], [place], MSN [place] [phone]

## 2017-07-06 DIAGNOSIS — M86132 Other acute osteomyelitis, left radius and ulna: Secondary | ICD-10-CM | POA: Diagnosis not present

## 2017-07-06 DIAGNOSIS — I110 Hypertensive heart disease with heart failure: Secondary | ICD-10-CM | POA: Diagnosis not present

## 2017-07-06 DIAGNOSIS — E1169 Type 2 diabetes mellitus with other specified complication: Secondary | ICD-10-CM | POA: Diagnosis not present

## 2017-07-06 DIAGNOSIS — E1140 Type 2 diabetes mellitus with diabetic neuropathy, unspecified: Secondary | ICD-10-CM | POA: Diagnosis not present

## 2017-07-06 DIAGNOSIS — L97222 Non-pressure chronic ulcer of left calf with fat layer exposed: Secondary | ICD-10-CM | POA: Diagnosis not present

## 2017-07-06 DIAGNOSIS — E11622 Type 2 diabetes mellitus with other skin ulcer: Secondary | ICD-10-CM | POA: Diagnosis not present

## 2017-07-06 DIAGNOSIS — K861 Other chronic pancreatitis: Secondary | ICD-10-CM | POA: Diagnosis not present

## 2017-07-06 DIAGNOSIS — L98492 Non-pressure chronic ulcer of skin of other sites with fat layer exposed: Secondary | ICD-10-CM | POA: Diagnosis not present

## 2017-07-06 DIAGNOSIS — Z0000 Encounter for general adult medical examination without abnormal findings: Secondary | ICD-10-CM | POA: Diagnosis not present

## 2017-07-06 DIAGNOSIS — E1165 Type 2 diabetes mellitus with hyperglycemia: Secondary | ICD-10-CM | POA: Diagnosis not present

## 2017-07-09 ENCOUNTER — Encounter: Payer: Self-pay | Admitting: *Deleted

## 2017-07-09 ENCOUNTER — Other Ambulatory Visit: Payer: Self-pay | Admitting: *Deleted

## 2017-07-09 DIAGNOSIS — K859 Acute pancreatitis without necrosis or infection, unspecified: Secondary | ICD-10-CM | POA: Diagnosis not present

## 2017-07-09 DIAGNOSIS — M869 Osteomyelitis, unspecified: Secondary | ICD-10-CM | POA: Diagnosis not present

## 2017-07-09 DIAGNOSIS — A491 Streptococcal infection, unspecified site: Secondary | ICD-10-CM | POA: Diagnosis not present

## 2017-07-09 LAB — FUNGUS CULTURE RESULT

## 2017-07-09 LAB — FUNGUS CULTURE WITH STAIN

## 2017-07-09 LAB — FUNGAL ORGANISM REFLEX

## 2017-07-09 NOTE — Patient Outreach (Signed)
[place] [facility]) Care Management   07/09/2017  Melvin [name] [birth year] [id number]  Melvin [name] is an 57 y.o. male  Subjective:   Member alert and oriented x3, denies complaints of pain or discomfort at this time. Report compliance with medications, PICC line in right upper arm.  Report daughter provides daily IV antibiotic, denies signs/symptoms of infection.  State home health visit twice a week, nursing and PT involved once a week.  Report next appointment with infectious disease is 2/12, will have sutures removed at that time.  Objective:   Review of Systems  Constitutional: Negative.   HENT: Negative.   Eyes: Negative.   Respiratory: Negative.   Cardiovascular: Negative.   Gastrointestinal: Negative.   Genitourinary: Negative.   Musculoskeletal: Negative.   Skin:       Left wrist with open wound, left ankle with healing ulcer  Neurological: Negative.   Endo/Heme/Allergies: Negative.   Psychiatric/Behavioral: Negative.     Physical Exam  Constitutional: He is oriented to person, place, and time. He appears well-developed and well-nourished.  Neck: Normal range of motion.  Cardiovascular: Regular rhythm and normal heart sounds.  Tachycardia, asymptomatic  Respiratory: Effort normal and breath sounds normal.  GI: Soft. Bowel sounds are normal.  Musculoskeletal: Normal range of motion.  Neurological: He is alert and oriented to person, place, and time.  Skin: Skin is warm and dry.   BP 118/72   Pulse (!) 108   Resp 18   Ht 1.727 m (5' 8" )   Wt 173 lb (78.5 kg)   SpO2 99%   BMI 26.30 kg/m   Encounter Medications:   Outpatient Encounter Medications as of 07/09/2017  Medication Sig Note  . ACCU-CHEK FASTCLIX LANCETS MISC Use as directed for 3 times daily testing of blood glucose. E11.9   . ACCU-CHEK SOFTCLIX LANCETS lancets Use as instructed for 3 times daily testing of blood sugar. E11.9   . Alcohol Swabs (B-D SINGLE USE SWABS REGULAR) PADS  USE AS DIRECTED THREE TIMES DAILY  AND AT BEDTIME   . aspirin 81 MG EC tablet Take 1 tablet (81 mg total) by mouth daily.   Marland Kitchen atorvastatin (LIPITOR) 10 MG tablet TAKE 1 TABLET EVERY DAY AT 6  P.M. (Patient taking differently: Take 10 mg once a day at 6 PM)   . bacitracin ointment Apply topically 2 (two) times daily.   . Blood Glucose Monitoring Suppl (ACCU-CHEK NANO SMARTVIEW) w/Device KIT Use as directed for 3 times daily testing of blood glucose.   . cefTRIAXone (ROCEPHIN) IVPB Inject 2 g into the vein daily. Indication: Strep viridans osteo Last Day of Therapy:  07/21/17 Labs - Once weekly:  CBC/D and BMP, Labs - Every other week:  ESR and CRP   . gabapentin (NEURONTIN) 300 MG capsule Take 2 capsules (600 mg total) by mouth 2 (two) times daily.   Marland Kitchen glucose blood (ACCU-CHEK SMARTVIEW) test strip Use as instructed for 3 times daily testing of blood glucose. E11.9   . insulin aspart (NOVOLOG) 100 UNIT/ML injection 150-200, give 3 units 201-250 give 5 units 251-300 give 7 units 301-350 give 9 units 351-400 give 12 units (Patient taking differently: Inject 3-12 Units into the skin See admin instructions. 3-12 units three times a day before meals PER SLIDING SCALE: BGL 150-200 = 3 units; 201-250 = 5 units; 251-300 = 7 units; 301-350 = 9 units; 351-400 = 12 units) 06/10/2017: Regimen verified by the patient  . insulin glargine (LANTUS) 100 UNIT/ML injection Inject  0.3 mLs (30 Units total) into the skin at bedtime.   . Insulin Syringe-Needle U-100 (B-D INS SYRINGE 0.5CC/31GX5/16) 31G X 5/16" 0.5 ML MISC USE TO INJECT INSULIN 3 TIMES A DAY AND AT BEDTIME   . Lancet Devices (ACCU-CHEK SOFTCLIX) lancets Use as instructed for 3 times daily testing of blood sugar. E11.9   . naproxen (NAPROSYN) 500 MG tablet TAKE 1 TABLET (500 MG TOTAL) BY MOUTH 2 (TWO) TIMES DAILY WITH A MEAL. (Patient taking differently: Take 500 mg by mouth 2 (two) times daily as needed (for pain). )   . Pancrelipase, Lip-Prot-Amyl, (ZENPEP)  25000-79000 units CPEP Take 25,000 Units by mouth 3 (three) times daily. Before each meal   . silver sulfADIAZINE (SILVADENE) 1 % cream Apply 1 application topically daily. (Patient not taking: Reported on 07/09/2017)    No facility-administered encounter medications on file as of 07/09/2017.     Functional Status:   In your present state of health, do you have any difficulty performing the following activities: 07/09/2017 06/14/2017  Hearing? N N  Vision? Y N  Comment Reading  -  Difficulty concentrating or making decisions? N N  Walking or climbing stairs? Y Y  Dressing or bathing? N N  Doing errands, shopping? Y -  Comment daughter or sister provide transportation -  Conservation officer, nature and eating ? Y -  Comment Neuropathy -  Using the Toilet? N -  In the past six months, have you accidently leaked urine? N -  Do you have problems with loss of bowel control? Y -  Managing your Medications? N -  Managing your Finances? N -  Housekeeping or managing your Housekeeping? N -  Some recent data might be hidden    Fall/Depression Screening:    Fall Risk  07/09/2017 02/13/2017 11/11/2016  Falls in the past year? Yes No No  Number falls in past yr: 2 or more - -  Injury with Fall? No - -  Risk Factor Category  High Fall Risk - -  Risk for fall due to : History of fall(s) - -  Follow up Education provided;Falls prevention discussed - -   PHQ 2/9 Scores 07/09/2017 07/01/2017 06/10/2017 02/13/2017 11/11/2016 07/18/2016 06/24/2016  PHQ - 2 Score 0 0 0 0 0 0 0  PHQ- 9 Score - 0 0 0 1 2 -    Assessment:    Met with member at scheduled time.  [facility] care management services again explained.  He report he is familiar as his mother was involved with program.  Consent obtained.    Member very knowledgeable regarding care of his PICC line, wound, and overall diabetes management.  He does admit to some noncompliance with diabetes management at times.  Patient was recently discharged from hospital and all medications  have been reviewed, denies concern/need for pharmacy involvement.  He is able to take medications independently.    He performs dressing changes daily independently, but daughter is able to help when available.  Per discharge instructions, it was recommended that he be referred to wound care center.  He denies knowing any details regarding this, will discuss with infectious disease MD at next visit.    Attempted to provide member with [facility] calendar tool book, state he prefer to use his calendar on his phone.  Advised to record readings from glucose meter (log included in book) report he take his meter with him to appointments for MD to review.  Provided him with contact information for this care manager, advised  to contact with questions/concerns.  Plan:   Will follow up next week with weekly transition of care call.   Will follow up next month with routine home visit.  THN CM Care Plan Problem One     Most Recent Value  Care Plan Problem One  Risk for readmission related to complications of diabetes as evidence by recent admission for nonhealing wound (osteomylitis)  Role Documenting the Problem One  Care Management [place] for Problem One  Active  THN Long Term Goal   Member will not be readmitted to hospital within 31 days of discharge  [place] Term Goal Start Date  06/26/17  Interventions for Problem One Long Term Goal  Member educated on proper diet in effort to manage blood sugars and promote wound healing.  THN CM Short Term Goal #1   Member will repot taking medications as instructed over the next 4 weeks  THN CM Short Term Goal #1 Start Date  06/26/17  Interventions for Short Term Goal #1  Medications reviewed with member in the home, bottles examined for expiration dates.  Advised of importance of taking as instructed, especially IV antibiotics.  THN CM Short Term Goal #2   Member will report follow up with primary MD within the next 2 weeks  THN CM Short Term Goal #2  Start Date  06/26/17  [facility] CM Short Term Goal #2 Met Date  07/09/17  Interventions for Short Term Goal #2  Verified with member appointment is scheduled.  Confirms he has transportation.     [name], [place], MSN [place] [phone]

## 2017-07-13 DIAGNOSIS — K861 Other chronic pancreatitis: Secondary | ICD-10-CM | POA: Diagnosis not present

## 2017-07-13 DIAGNOSIS — E1169 Type 2 diabetes mellitus with other specified complication: Secondary | ICD-10-CM | POA: Diagnosis not present

## 2017-07-13 DIAGNOSIS — E11622 Type 2 diabetes mellitus with other skin ulcer: Secondary | ICD-10-CM | POA: Diagnosis not present

## 2017-07-13 DIAGNOSIS — L97222 Non-pressure chronic ulcer of left calf with fat layer exposed: Secondary | ICD-10-CM | POA: Diagnosis not present

## 2017-07-13 DIAGNOSIS — M86639 Other chronic osteomyelitis, unspecified radius and ulna: Secondary | ICD-10-CM | POA: Diagnosis not present

## 2017-07-13 DIAGNOSIS — I110 Hypertensive heart disease with heart failure: Secondary | ICD-10-CM | POA: Diagnosis not present

## 2017-07-13 DIAGNOSIS — L98492 Non-pressure chronic ulcer of skin of other sites with fat layer exposed: Secondary | ICD-10-CM | POA: Diagnosis not present

## 2017-07-13 DIAGNOSIS — M86132 Other acute osteomyelitis, left radius and ulna: Secondary | ICD-10-CM | POA: Diagnosis not present

## 2017-07-13 DIAGNOSIS — E1165 Type 2 diabetes mellitus with hyperglycemia: Secondary | ICD-10-CM | POA: Diagnosis not present

## 2017-07-13 DIAGNOSIS — E1140 Type 2 diabetes mellitus with diabetic neuropathy, unspecified: Secondary | ICD-10-CM | POA: Diagnosis not present

## 2017-07-15 DIAGNOSIS — K861 Other chronic pancreatitis: Secondary | ICD-10-CM | POA: Diagnosis not present

## 2017-07-15 DIAGNOSIS — E11622 Type 2 diabetes mellitus with other skin ulcer: Secondary | ICD-10-CM | POA: Diagnosis not present

## 2017-07-15 DIAGNOSIS — I110 Hypertensive heart disease with heart failure: Secondary | ICD-10-CM | POA: Diagnosis not present

## 2017-07-15 DIAGNOSIS — L97222 Non-pressure chronic ulcer of left calf with fat layer exposed: Secondary | ICD-10-CM | POA: Diagnosis not present

## 2017-07-15 DIAGNOSIS — M86132 Other acute osteomyelitis, left radius and ulna: Secondary | ICD-10-CM | POA: Diagnosis not present

## 2017-07-15 DIAGNOSIS — E1169 Type 2 diabetes mellitus with other specified complication: Secondary | ICD-10-CM | POA: Diagnosis not present

## 2017-07-15 DIAGNOSIS — E1165 Type 2 diabetes mellitus with hyperglycemia: Secondary | ICD-10-CM | POA: Diagnosis not present

## 2017-07-15 DIAGNOSIS — E1140 Type 2 diabetes mellitus with diabetic neuropathy, unspecified: Secondary | ICD-10-CM | POA: Diagnosis not present

## 2017-07-15 DIAGNOSIS — L98492 Non-pressure chronic ulcer of skin of other sites with fat layer exposed: Secondary | ICD-10-CM | POA: Diagnosis not present

## 2017-07-16 ENCOUNTER — Telehealth: Payer: Self-pay | Admitting: Family Medicine

## 2017-07-16 ENCOUNTER — Other Ambulatory Visit: Payer: Self-pay | Admitting: *Deleted

## 2017-07-16 NOTE — Telephone Encounter (Signed)
Pick line measurements on patient we taken on 06/29/17 which was 1.5cm and 07/13/17 which were 5.0cm. Charity from [place] just wanted to report measurements as a courtesy call.  alycia

## 2017-07-16 NOTE — Patient Outreach (Signed)
[place] [facility]) Care Management  07/16/2017  Melvin [name] 1961/01/09 [id number]   Weekly transition of care call placed to member.  He report he is doing well, state he is continuing daily dressing changes.  State his blood sugar today was 271, report having some punch and potato salad yesterday when visiting family of a friend that passed away.  Proper diabetic diet discussed.  He state home health nurse has continued to visit weekly, next visit planned for Monday.  He denies any urgent concerns, will follow up with weekly call next week.   THN CM Care Plan Problem One     Most Recent Value  Care Plan Problem One  Risk for readmission related to complications of diabetes as evidence by recent admission for nonhealing wound (osteomylitis)  Role Documenting the Problem One  Care Management [place] for Problem One  Active  THN Long Term Goal   Member will not be readmitted to hospital within 31 days of discharge  [place] Term Goal Start Date  06/26/17  Interventions for Problem One Long Term Goal  Educated on proper diet in effort to decrease blood sugars, which in turn decreases A1C and promote wound healing.  THN CM Short Term Goal #1   Member will repot taking medications as instructed over the next 4 weeks  THN CM Short Term Goal #1 Start Date  06/26/17  Interventions for Short Term Goal #1  Discussed medication compliance, including use of IV antibiotic.  Report daily use to end on 2/12.  THN CM Short Term Goal #2   Member will keep and attend appointment with infectious disease MD within the next week  THN CM Short Term Goal #2 Start Date  07/16/17  Interventions for Short Term Goal #2  Reminded of time/date of appointment, confirmed he has transportation.      [name], [place], MSN [place] [phone]

## 2017-07-17 ENCOUNTER — Other Ambulatory Visit: Payer: Self-pay | Admitting: Family Medicine

## 2017-07-17 NOTE — Telephone Encounter (Signed)
If there is suspicion for dislodgment of the PICC line, the patient will have to have that checked in the hospital.

## 2017-07-20 ENCOUNTER — Encounter: Payer: Self-pay | Admitting: Family Medicine

## 2017-07-20 DIAGNOSIS — L98492 Non-pressure chronic ulcer of skin of other sites with fat layer exposed: Secondary | ICD-10-CM | POA: Diagnosis not present

## 2017-07-20 DIAGNOSIS — M86132 Other acute osteomyelitis, left radius and ulna: Secondary | ICD-10-CM | POA: Diagnosis not present

## 2017-07-20 DIAGNOSIS — E11622 Type 2 diabetes mellitus with other skin ulcer: Secondary | ICD-10-CM | POA: Diagnosis not present

## 2017-07-20 DIAGNOSIS — E1169 Type 2 diabetes mellitus with other specified complication: Secondary | ICD-10-CM | POA: Diagnosis not present

## 2017-07-20 DIAGNOSIS — E1165 Type 2 diabetes mellitus with hyperglycemia: Secondary | ICD-10-CM | POA: Diagnosis not present

## 2017-07-20 DIAGNOSIS — L97222 Non-pressure chronic ulcer of left calf with fat layer exposed: Secondary | ICD-10-CM | POA: Diagnosis not present

## 2017-07-20 DIAGNOSIS — E1140 Type 2 diabetes mellitus with diabetic neuropathy, unspecified: Secondary | ICD-10-CM | POA: Diagnosis not present

## 2017-07-20 DIAGNOSIS — I110 Hypertensive heart disease with heart failure: Secondary | ICD-10-CM | POA: Diagnosis not present

## 2017-07-20 DIAGNOSIS — K861 Other chronic pancreatitis: Secondary | ICD-10-CM | POA: Diagnosis not present

## 2017-07-21 ENCOUNTER — Encounter: Payer: Self-pay | Admitting: *Deleted

## 2017-07-21 ENCOUNTER — Telehealth: Payer: Self-pay | Admitting: Behavioral Health

## 2017-07-21 ENCOUNTER — Encounter: Payer: Self-pay | Admitting: Internal Medicine

## 2017-07-21 ENCOUNTER — Ambulatory Visit: Payer: Medicare HMO | Admitting: Internal Medicine

## 2017-07-21 DIAGNOSIS — M86132 Other acute osteomyelitis, left radius and ulna: Secondary | ICD-10-CM

## 2017-07-21 DIAGNOSIS — A491 Streptococcal infection, unspecified site: Secondary | ICD-10-CM | POA: Diagnosis not present

## 2017-07-21 DIAGNOSIS — Z794 Long term (current) use of insulin: Secondary | ICD-10-CM | POA: Diagnosis not present

## 2017-07-21 DIAGNOSIS — E119 Type 2 diabetes mellitus without complications: Secondary | ICD-10-CM

## 2017-07-21 NOTE — Telephone Encounter (Signed)
Writer called [place] and spoke to [facility].  Writer gave verbal order per Dr. Linus Salmons to discontinue daily IV Ceftriaxone and discontinue PICC line.  Orders repeated and readback by [facility].  Mary verbalized understanding with no additional questions. Pricilla Riffle RN

## 2017-07-21 NOTE — Progress Notes (Signed)
   Subjective:    Patient ID: Melvin [name], male    DOB: 05-May-1961, 57 y.o.   MRN: [id number]  HPI Here for follow up of osteomyelitis.  He has had poorly controlled diabetes and in early January developed osteomyelitis of the left arm.  Xray c/w osteomyelitis of distal ulna.  He was debrided by Dr. Grandville Silos on 06/10/17 and has done well.  Culture grew Strep viridans and he was continued on IV ceftriaxone for 6 weeks, which ends today on 2/12.  He is having no significant pain.   Stitches remain in.     Review of Systems  Constitutional: Negative for chills, fatigue and fever.  Gastrointestinal: Negative for diarrhea.  Skin: Negative for rash.  Neurological: Negative for dizziness.       Objective:   Physical Exam  Constitutional: He appears well-developed and well-nourished. No distress.  Eyes: No scleral icterus.  Cardiovascular: Normal rate, regular rhythm and normal heart sounds.  No murmur heard. Pulmonary/Chest: Effort normal and breath sounds normal.  Musculoskeletal:  Arm with stitches, open area near proximal portion; no surrounding erythema, discharge is serosanguinous, no pus, no tenderness.   Lymphadenopathy:    He has no cervical adenopathy.  Skin: No rash noted.   SH: no tobacco       Assessment & Plan:

## 2017-07-22 ENCOUNTER — Encounter: Payer: Self-pay | Admitting: Internal Medicine

## 2017-07-22 DIAGNOSIS — S41102A Unspecified open wound of left upper arm, initial encounter: Secondary | ICD-10-CM | POA: Diagnosis not present

## 2017-07-22 NOTE — Assessment & Plan Note (Addendum)
Completing treatment and based on xray.   Will make sure he gets follow up with Dr. Grandville Silos with an open area.

## 2017-07-22 NOTE — Assessment & Plan Note (Signed)
Has been on ceftriaxone and completing today.

## 2017-07-22 NOTE — Assessment & Plan Note (Signed)
I emphasized the need to have good control of his diabetes.

## 2017-07-23 ENCOUNTER — Other Ambulatory Visit: Payer: Self-pay | Admitting: *Deleted

## 2017-07-23 NOTE — Patient Outreach (Signed)
[facility] [place] [facility]) Care Management  07/23/2017  Melvin [name] 04/23/1961 [id number]   Weekly transition of care call placed to member.  He report doing well, still in bed so has not taken blood sugar today.  State reading last night was elevated due to eating pizza for dinner, stating "I had to eat something, that's what they (referring to his relative) had."  Verbalizes understanding of maintaining diabetic diet in effort to manage diabetes.    Report arm wound healing well, stitches removed.  He is finished with IV antibiotic therapy, home health nurse to remove PICC line on Monday.  Denies any urgent concerns at this time.  Will follow up next week telephonically.  THN CM Care Plan Problem One     Most Recent Value  Care Plan Problem One  Risk for readmission related to complications of diabetes as evidence by recent admission for nonhealing wound (osteomylitis)  Role Documenting the Problem One  Care Management [place] for Problem One  Active  THN Long Term Goal   Member will not be readmitted to hospital within 31 days of discharge  [place] Term Goal Start Date  06/26/17  Interventions for Problem One Long Term Goal  Re-educated on proper diet to control blood sugars, advised to continue to check blood sugars multpile times a day in effort control diabetes.  THN CM Short Term Goal #1   Member will repot taking medications as instructed over the next 4 weeks  THN CM Short Term Goal #1 Start Date  06/26/17  [facility] CM Short Term Goal #1 Met Date  07/22/17  Interventions for Short Term Goal #1  Discussed medication compliance, including use of IV antibiotic.  Report daily use to end on 2/12.  THN CM Short Term Goal #2   Member will keep and attend appointment with infectious disease MD within the next week  THN CM Short Term Goal #2 Start Date  07/16/17  [facility] CM Short Term Goal #2 Met Date  07/22/17  Interventions for Short Term Goal #2  Reminded of time/date of  appointment, confirmed he has transportation.     [name], [place], MSN [place] [phone]

## 2017-07-27 ENCOUNTER — Other Ambulatory Visit: Payer: Self-pay | Admitting: Family Medicine

## 2017-07-27 DIAGNOSIS — L98492 Non-pressure chronic ulcer of skin of other sites with fat layer exposed: Secondary | ICD-10-CM | POA: Diagnosis not present

## 2017-07-27 DIAGNOSIS — E11622 Type 2 diabetes mellitus with other skin ulcer: Secondary | ICD-10-CM | POA: Diagnosis not present

## 2017-07-27 DIAGNOSIS — K861 Other chronic pancreatitis: Secondary | ICD-10-CM | POA: Diagnosis not present

## 2017-07-27 DIAGNOSIS — M86132 Other acute osteomyelitis, left radius and ulna: Secondary | ICD-10-CM | POA: Diagnosis not present

## 2017-07-27 DIAGNOSIS — E785 Hyperlipidemia, unspecified: Secondary | ICD-10-CM

## 2017-07-27 DIAGNOSIS — E1169 Type 2 diabetes mellitus with other specified complication: Secondary | ICD-10-CM | POA: Diagnosis not present

## 2017-07-27 DIAGNOSIS — M869 Osteomyelitis, unspecified: Secondary | ICD-10-CM | POA: Diagnosis not present

## 2017-07-27 DIAGNOSIS — E1165 Type 2 diabetes mellitus with hyperglycemia: Secondary | ICD-10-CM | POA: Diagnosis not present

## 2017-07-27 DIAGNOSIS — L97222 Non-pressure chronic ulcer of left calf with fat layer exposed: Secondary | ICD-10-CM | POA: Diagnosis not present

## 2017-07-27 DIAGNOSIS — E1140 Type 2 diabetes mellitus with diabetic neuropathy, unspecified: Secondary | ICD-10-CM | POA: Diagnosis not present

## 2017-07-27 DIAGNOSIS — I110 Hypertensive heart disease with heart failure: Secondary | ICD-10-CM | POA: Diagnosis not present

## 2017-07-28 ENCOUNTER — Other Ambulatory Visit: Payer: Self-pay | Admitting: *Deleted

## 2017-07-28 NOTE — Patient Outreach (Signed)
[place] [facility]) Care Management  07/28/2017  Melvin [name] 1960/07/07 [id number]   Weekly transition of care call placed to member.  He report he continues to do well with the exception of his ankle. State he was outside yesterday talking to his landlord and tripped over a curb, twisting his ankle and falling.  He denies hitting his head, state he has nursed it with ice, Naproxen and rest.  He denies any problems with his arm wound, continue to heal properly without complications.  He denies checking his blood sugar today, state he is just waking up.  Denies any urgent concerns, agree to proceed with routine home visit next week.    THN CM Care Plan Problem One     Most Recent Value  Care Plan Problem One  Risk for readmission related to complications of diabetes as evidence by recent admission for nonhealing wound (osteomylitis)  Role Documenting the Problem One  Care Management [place] for Problem One  Active  THN Long Term Goal   Member will not be readmitted to hospital within 31 days of discharge  [facility] Long Term Goal Start Date  06/26/17  [facility] Long Term Goal Met Date  07/28/17  Interventions for Problem One Long Term Goal  Re-educated on proper diet to control blood sugars, advised to continue to check blood sugars multpile times a day in effort control diabetes.     [name], [place], MSN [place] [phone]

## 2017-07-29 ENCOUNTER — Other Ambulatory Visit: Payer: Self-pay | Admitting: *Deleted

## 2017-07-29 NOTE — Patient Outreach (Signed)
[place] [facility]) Care Management  07/29/2017  Melvin [name] 17-Apr-1961 [id number]   Call received from member stating that he is now experiencing hip pain since his fall.  He inquires about what he should do.  Advised to contact primary MD office to report fall and request appointment for assessment.  He state that it is sometimes hard to get into office on short notice, report he is considering going to the ED.  Advised that this is peak flu season and ED may not be good idea.  Member advised again to contact MD office, if unable to get appointment, advised to go to the urgent care center instead of ED.  Report he will have his daughter take him once she is home from work.  He will contact this care manager with update.    [name], [place], MSN [place] [phone]

## 2017-07-30 DIAGNOSIS — E1169 Type 2 diabetes mellitus with other specified complication: Secondary | ICD-10-CM | POA: Diagnosis not present

## 2017-07-30 DIAGNOSIS — K861 Other chronic pancreatitis: Secondary | ICD-10-CM | POA: Diagnosis not present

## 2017-07-30 DIAGNOSIS — L97222 Non-pressure chronic ulcer of left calf with fat layer exposed: Secondary | ICD-10-CM | POA: Diagnosis not present

## 2017-07-30 DIAGNOSIS — E1140 Type 2 diabetes mellitus with diabetic neuropathy, unspecified: Secondary | ICD-10-CM | POA: Diagnosis not present

## 2017-07-30 DIAGNOSIS — I110 Hypertensive heart disease with heart failure: Secondary | ICD-10-CM | POA: Diagnosis not present

## 2017-07-30 DIAGNOSIS — E11622 Type 2 diabetes mellitus with other skin ulcer: Secondary | ICD-10-CM | POA: Diagnosis not present

## 2017-07-30 DIAGNOSIS — M86132 Other acute osteomyelitis, left radius and ulna: Secondary | ICD-10-CM | POA: Diagnosis not present

## 2017-07-30 DIAGNOSIS — E1165 Type 2 diabetes mellitus with hyperglycemia: Secondary | ICD-10-CM | POA: Diagnosis not present

## 2017-07-30 DIAGNOSIS — L98492 Non-pressure chronic ulcer of skin of other sites with fat layer exposed: Secondary | ICD-10-CM | POA: Diagnosis not present

## 2017-07-31 ENCOUNTER — Encounter (HOSPITAL_COMMUNITY): Payer: Self-pay

## 2017-07-31 ENCOUNTER — Inpatient Hospital Stay (HOSPITAL_COMMUNITY)
Admission: EM | Admit: 2017-07-31 | Discharge: 2017-08-04 | DRG: 470 | Disposition: A | Discharge status: Home Health | Payer: Medicare HMO | Attending: Internal Medicine | Admitting: Internal Medicine

## 2017-07-31 ENCOUNTER — Emergency Department (HOSPITAL_COMMUNITY): Payer: Medicare HMO

## 2017-07-31 ENCOUNTER — Inpatient Hospital Stay (HOSPITAL_COMMUNITY): Payer: Medicare HMO

## 2017-07-31 ENCOUNTER — Encounter (HOSPITAL_COMMUNITY): Payer: Self-pay | Admitting: Emergency Medicine

## 2017-07-31 ENCOUNTER — Other Ambulatory Visit: Payer: Self-pay

## 2017-07-31 ENCOUNTER — Ambulatory Visit (INDEPENDENT_AMBULATORY_CARE_PROVIDER_SITE_OTHER): Payer: Medicare HMO

## 2017-07-31 ENCOUNTER — Ambulatory Visit (INDEPENDENT_AMBULATORY_CARE_PROVIDER_SITE_OTHER)
Admission: EM | Admit: 2017-07-31 | Discharge: 2017-07-31 | Disposition: A | Discharge status: Home / Self Care | Payer: Medicare HMO | Source: Home / Self Care

## 2017-07-31 DIAGNOSIS — S72042A Displaced fracture of base of neck of left femur, initial encounter for closed fracture: Secondary | ICD-10-CM | POA: Diagnosis present

## 2017-07-31 DIAGNOSIS — Z833 Family history of diabetes mellitus: Secondary | ICD-10-CM | POA: Diagnosis not present

## 2017-07-31 DIAGNOSIS — D696 Thrombocytopenia, unspecified: Secondary | ICD-10-CM

## 2017-07-31 DIAGNOSIS — G629 Polyneuropathy, unspecified: Secondary | ICD-10-CM

## 2017-07-31 DIAGNOSIS — Z9181 History of falling: Secondary | ICD-10-CM

## 2017-07-31 DIAGNOSIS — I129 Hypertensive chronic kidney disease with stage 1 through stage 4 chronic kidney disease, or unspecified chronic kidney disease: Secondary | ICD-10-CM | POA: Diagnosis not present

## 2017-07-31 DIAGNOSIS — S72002A Fracture of unspecified part of neck of left femur, initial encounter for closed fracture: Secondary | ICD-10-CM | POA: Diagnosis not present

## 2017-07-31 DIAGNOSIS — D6959 Other secondary thrombocytopenia: Secondary | ICD-10-CM | POA: Diagnosis present

## 2017-07-31 DIAGNOSIS — M7732 Calcaneal spur, left foot: Secondary | ICD-10-CM | POA: Diagnosis present

## 2017-07-31 DIAGNOSIS — E785 Hyperlipidemia, unspecified: Secondary | ICD-10-CM | POA: Diagnosis not present

## 2017-07-31 DIAGNOSIS — E1122 Type 2 diabetes mellitus with diabetic chronic kidney disease: Secondary | ICD-10-CM | POA: Diagnosis not present

## 2017-07-31 DIAGNOSIS — N179 Acute kidney failure, unspecified: Secondary | ICD-10-CM | POA: Diagnosis not present

## 2017-07-31 DIAGNOSIS — R52 Pain, unspecified: Secondary | ICD-10-CM | POA: Diagnosis not present

## 2017-07-31 DIAGNOSIS — Z96642 Presence of left artificial hip joint: Secondary | ICD-10-CM

## 2017-07-31 DIAGNOSIS — E1140 Type 2 diabetes mellitus with diabetic neuropathy, unspecified: Secondary | ICD-10-CM | POA: Diagnosis not present

## 2017-07-31 DIAGNOSIS — I10 Essential (primary) hypertension: Secondary | ICD-10-CM | POA: Diagnosis not present

## 2017-07-31 DIAGNOSIS — Z452 Encounter for adjustment and management of vascular access device: Secondary | ICD-10-CM | POA: Diagnosis not present

## 2017-07-31 DIAGNOSIS — D62 Acute posthemorrhagic anemia: Secondary | ICD-10-CM | POA: Diagnosis not present

## 2017-07-31 DIAGNOSIS — M869 Osteomyelitis, unspecified: Secondary | ICD-10-CM | POA: Diagnosis present

## 2017-07-31 DIAGNOSIS — F1010 Alcohol abuse, uncomplicated: Secondary | ICD-10-CM | POA: Diagnosis present

## 2017-07-31 DIAGNOSIS — E1165 Type 2 diabetes mellitus with hyperglycemia: Secondary | ICD-10-CM | POA: Diagnosis present

## 2017-07-31 DIAGNOSIS — K8689 Other specified diseases of pancreas: Secondary | ICD-10-CM | POA: Diagnosis not present

## 2017-07-31 DIAGNOSIS — Y92009 Unspecified place in unspecified non-institutional (private) residence as the place of occurrence of the external cause: Secondary | ICD-10-CM | POA: Diagnosis not present

## 2017-07-31 DIAGNOSIS — Z419 Encounter for procedure for purposes other than remedying health state, unspecified: Secondary | ICD-10-CM

## 2017-07-31 DIAGNOSIS — I110 Hypertensive heart disease with heart failure: Secondary | ICD-10-CM | POA: Diagnosis not present

## 2017-07-31 DIAGNOSIS — W010XXA Fall on same level from slipping, tripping and stumbling without subsequent striking against object, initial encounter: Secondary | ICD-10-CM | POA: Diagnosis present

## 2017-07-31 DIAGNOSIS — E119 Type 2 diabetes mellitus without complications: Secondary | ICD-10-CM

## 2017-07-31 DIAGNOSIS — Z8249 Family history of ischemic heart disease and other diseases of the circulatory system: Secondary | ICD-10-CM | POA: Diagnosis not present

## 2017-07-31 DIAGNOSIS — Z794 Long term (current) use of insulin: Secondary | ICD-10-CM | POA: Diagnosis not present

## 2017-07-31 DIAGNOSIS — I5032 Chronic diastolic (congestive) heart failure: Secondary | ICD-10-CM | POA: Diagnosis not present

## 2017-07-31 DIAGNOSIS — S72012A Unspecified intracapsular fracture of left femur, initial encounter for closed fracture: Secondary | ICD-10-CM | POA: Diagnosis not present

## 2017-07-31 DIAGNOSIS — N182 Chronic kidney disease, stage 2 (mild): Secondary | ICD-10-CM | POA: Diagnosis present

## 2017-07-31 DIAGNOSIS — E860 Dehydration: Secondary | ICD-10-CM | POA: Diagnosis present

## 2017-07-31 DIAGNOSIS — Z471 Aftercare following joint replacement surgery: Secondary | ICD-10-CM | POA: Diagnosis not present

## 2017-07-31 LAB — CBG MONITORING, ED: Glucose-Capillary: 364 mg/dL — ABNORMAL HIGH (ref 65–99)

## 2017-07-31 LAB — CBC WITH DIFFERENTIAL/PLATELET
Basophils Absolute: 0 10*3/uL (ref 0.0–0.1)
Basophils Relative: 0 %
Eosinophils Absolute: 0.3 10*3/uL (ref 0.0–0.7)
Eosinophils Relative: 5 %
HCT: 33 % — ABNORMAL LOW (ref 39.0–52.0)
Hemoglobin: 10.9 g/dL — ABNORMAL LOW (ref 13.0–17.0)
Lymphocytes Relative: 24 %
Lymphs Abs: 1.3 10*3/uL (ref 0.7–4.0)
MCH: 31.6 pg (ref 26.0–34.0)
MCHC: 33 g/dL (ref 30.0–36.0)
MCV: 95.7 fL (ref 78.0–100.0)
Monocytes Absolute: 0.6 10*3/uL (ref 0.1–1.0)
Monocytes Relative: 12 %
Neutro Abs: 3.1 10*3/uL (ref 1.7–7.7)
Neutrophils Relative %: 59 %
Platelets: 145 10*3/uL — ABNORMAL LOW (ref 150–400)
RBC: 3.45 MIL/uL — ABNORMAL LOW (ref 4.22–5.81)
RDW: 14.7 % (ref 11.5–15.5)
WBC: 5.3 10*3/uL (ref 4.0–10.5)

## 2017-07-31 LAB — SURGICAL PCR SCREEN
MRSA, PCR: NEGATIVE
Staphylococcus aureus: NEGATIVE

## 2017-07-31 LAB — BASIC METABOLIC PANEL
Anion gap: 7 (ref 5–15)
BUN: 20 mg/dL (ref 6–20)
CO2: 18 mmol/L — ABNORMAL LOW (ref 22–32)
Calcium: 9 mg/dL (ref 8.9–10.3)
Chloride: 112 mmol/L — ABNORMAL HIGH (ref 101–111)
Creatinine, Ser: 1.4 mg/dL — ABNORMAL HIGH (ref 0.61–1.24)
GFR calc Af Amer: >60 mL/min (ref >60)
GFR calc non Af Amer: 54 mL/min — ABNORMAL LOW (ref >60)
Glucose, Bld: 391 mg/dL — ABNORMAL HIGH (ref 65–99)
Potassium: 4.2 mmol/L (ref 3.5–5.1)
Sodium: 137 mmol/L (ref 135–145)

## 2017-07-31 LAB — GLUCOSE, CAPILLARY: GLUCOSE-CAPILLARY: 227 mg/dL — AB (ref 65–99)

## 2017-07-31 MED ORDER — SODIUM CHLORIDE 0.9 % IV SOLN
INTRAVENOUS | Status: DC
  Administered 2017-08-01: 01:00:00 via INTRAVENOUS

## 2017-07-31 MED ORDER — MORPHINE SULFATE (PF) 4 MG/ML IV SOLN
4.0000 mg | INTRAVENOUS | Status: DC | PRN
  Administered 2017-08-01 (×2): 4 mg via INTRAVENOUS
  Filled 2017-07-31 (×2): qty 1

## 2017-07-31 MED ORDER — INSULIN ASPART 100 UNIT/ML ~~LOC~~ SOLN
0.0000 [IU] | Freq: Three times a day (TID) | SUBCUTANEOUS | Status: DC
  Administered 2017-08-01: 2 [IU] via SUBCUTANEOUS
  Administered 2017-08-01: 11 [IU] via SUBCUTANEOUS
  Administered 2017-08-02: 3 [IU] via SUBCUTANEOUS
  Administered 2017-08-02: 15 [IU] via SUBCUTANEOUS
  Administered 2017-08-03: 11 [IU] via SUBCUTANEOUS
  Administered 2017-08-03: 3 [IU] via SUBCUTANEOUS
  Administered 2017-08-03: 8 [IU] via SUBCUTANEOUS
  Administered 2017-08-04 (×2): 5 [IU] via SUBCUTANEOUS

## 2017-07-31 MED ORDER — HYDROCODONE-ACETAMINOPHEN 5-325 MG PO TABS
1.0000 | ORAL_TABLET | Freq: Four times a day (QID) | ORAL | Status: DC | PRN
  Administered 2017-07-31: 2 via ORAL
  Filled 2017-07-31: qty 2

## 2017-07-31 MED ORDER — MORPHINE SULFATE (PF) 4 MG/ML IV SOLN
4.0000 mg | Freq: Once | INTRAVENOUS | Status: AC
  Administered 2017-07-31: 4 mg via INTRAVENOUS
  Filled 2017-07-31: qty 1

## 2017-07-31 MED ORDER — GABAPENTIN 300 MG PO CAPS
600.0000 mg | ORAL_CAPSULE | Freq: Two times a day (BID) | ORAL | Status: DC
  Administered 2017-07-31 – 2017-08-04 (×7): 600 mg via ORAL
  Filled 2017-07-31 (×7): qty 2

## 2017-07-31 MED ORDER — INSULIN GLARGINE 100 UNIT/ML ~~LOC~~ SOLN
30.0000 [IU] | Freq: Once | SUBCUTANEOUS | Status: AC
  Administered 2017-07-31: 30 [IU] via SUBCUTANEOUS
  Filled 2017-07-31: qty 0.3

## 2017-07-31 MED ORDER — INSULIN ASPART 100 UNIT/ML ~~LOC~~ SOLN
0.0000 [IU] | Freq: Every day | SUBCUTANEOUS | Status: DC
  Administered 2017-07-31: 2 [IU] via SUBCUTANEOUS
  Administered 2017-08-01: 3 [IU] via SUBCUTANEOUS
  Administered 2017-08-02: 2 [IU] via SUBCUTANEOUS

## 2017-07-31 MED ORDER — SODIUM CHLORIDE 0.9 % IV BOLUS (SEPSIS)
500.0000 mL | Freq: Once | INTRAVENOUS | Status: AC
  Administered 2017-07-31: 500 mL via INTRAVENOUS

## 2017-07-31 MED ORDER — SODIUM CHLORIDE 0.9% FLUSH
10.0000 mL | INTRAVENOUS | Status: DC | PRN
  Administered 2017-08-03: 10 mL
  Filled 2017-07-31: qty 40

## 2017-07-31 NOTE — Discharge Instructions (Signed)
Have your daughter take you directly to the emergency room for further evaluation and treatment.

## 2017-07-31 NOTE — ED Provider Notes (Addendum)
[place]   [id number] 07/31/17 Arrival Time: 8   SUBJECTIVE:  Melvin [name] is a 57 y.o. male who presents to the urgent care with complaint of left hip pain.    Pt states his legs got tangled,(he states he is a fall risk,) and he fell on his left hip.  He has pain to the hip and left ankle.    Patient has pain in left groin with weight bearing or movement of the hip    Past Medical History:  Diagnosis Date  . Anemia   . Angina   . CHF (congestive heart failure) ([place])   . Diabetes mellitus   . GERD (gastroesophageal reflux disease)   . Hyperlipidemia   . Meningitis   . Neuromuscular disorder ([place])    diabetic neuropathy  . Pancreatitis   . Pneumonia    Family History  Problem Relation Age of Onset  . Heart failure Mother   . Diabetes Mother   . Hypertension Mother   . Heart disease Father   . Heart failure Father   . Colon cancer Neg Hx   . Colon polyps Neg Hx   . Esophageal cancer Neg Hx   . Rectal cancer Neg Hx   . Stomach cancer Neg Hx    Social History   Socioeconomic History  . Marital status: Single    Spouse name: Not on file  . Number of children: Not on file  . Years of education: Not on file  . Highest education level: Not on file  Social Needs  . Financial resource strain: Not on file  . Food insecurity - worry: Not on file  . Food insecurity - inability: Not on file  . Transportation needs - medical: Not on file  . Transportation needs - non-medical: Not on file  Occupational History  . Not on file  Tobacco Use  . Smoking status: Never Smoker  . Smokeless tobacco: Never Used  Substance and Sexual Activity  . Alcohol use: Yes    Alcohol/week: 4.2 oz    Types: 3 Cans of beer, 4 Shots of liquor per week    Comment: couple times weekly  . Drug use: Yes    Types: Cocaine    Comment: pt reports last drug use X5 year ago   . Sexual activity: Not on file  Other Topics Concern  . Not on file  Social History Narrative    . Not on file   Current Meds  Medication Sig  . ACCU-CHEK FASTCLIX LANCETS MISC Use as directed for 3 times daily testing of blood glucose. E11.9  . ACCU-CHEK SOFTCLIX LANCETS lancets Use as instructed for 3 times daily testing of blood sugar. E11.9  . Alcohol Swabs (B-D SINGLE USE SWABS REGULAR) PADS USE AS DIRECTED THREE TIMES DAILY  AND AT BEDTIME  . aspirin 81 MG EC tablet Take 1 tablet (81 mg total) by mouth daily.  Marland Kitchen atorvastatin (LIPITOR) 10 MG tablet TAKE 1 TABLET ONE TIME DAILY  AT  6  P.M.  . Blood Glucose Monitoring Suppl (ACCU-CHEK NANO SMARTVIEW) w/Device KIT Use as directed for 3 times daily testing of blood glucose.  . gabapentin (NEURONTIN) 300 MG capsule Take 2 capsules (600 mg total) by mouth 2 (two) times daily.  Marland Kitchen glucose blood (ACCU-CHEK SMARTVIEW) test strip Use as instructed for 3 times daily testing of blood glucose. E11.9  . insulin aspart (NOVOLOG) 100 UNIT/ML injection 150-200, give 3 units 201-250 give 5 units 251-300 give 7 units  301-350 give 9 units 351-400 give 12 units (Patient taking differently: Inject 3-12 Units into the skin See admin instructions. 3-12 units three times a day before meals PER SLIDING SCALE: BGL 150-200 = 3 units; 201-250 = 5 units; 251-300 = 7 units; 301-350 = 9 units; 351-400 = 12 units)  . insulin glargine (LANTUS) 100 UNIT/ML injection Inject 0.3 mLs (30 Units total) into the skin at bedtime.  . Insulin Syringe-Needle U-100 (B-D INS SYRINGE 0.5CC/31GX5/16) 31G X 5/16" 0.5 ML MISC USE TO INJECT INSULIN 3 TIMES A DAY AND AT BEDTIME  . Lancet Devices (ACCU-CHEK SOFTCLIX) lancets Use as instructed for 3 times daily testing of blood sugar. E11.9  . naproxen (NAPROSYN) 500 MG tablet TAKE 1 TABLET (500 MG TOTAL) BY MOUTH 2 (TWO) TIMES DAILY WITH A MEAL. (Patient taking differently: Take 500 mg by mouth 2 (two) times daily as needed (for pain). )   No Known Allergies    ROS: As per HPI, remainder of ROS negative.   OBJECTIVE:   Vitals:    07/31/17 1404  BP: (!) 150/50  Pulse: 97  Temp: 97.7 F (36.5 C)  TempSrc: Oral  SpO2: 100%     General appearance: alert; no distress Eyes: PERRL; EOMI; conjunctiva normal HENT: normocephalic; atraumatic; TMs normal, canal normal, external ears normal without trauma; nasal mucosa normal; oral mucosa normal Neck: supple Back: no CVA tenderness Extremities: no cyanosis or edema; symmetrical with no gross deformities; pain with internal or external rotation of the left hip, similar pain there in the inguinal crease with flexion/extension.  No trochanteric tenderness. Skin: warm and dry Neurologic: normal gait; grossly normal Psychological: alert and cooperative; normal mood and affect      Labs:  Results for orders placed or performed in visit on 07/01/17  POCT glucose (manual entry)  Result Value Ref Range   POC Glucose 140 (A) 70 - 99 mg/dl    Labs Reviewed - No data to display  No results found.     ASSESSMENT & PLAN:  1. Pain   2. History of recent fall     No orders of the defined types were placed in this encounter.   Reviewed expectations re: course of current medical issues. Questions answered. Outlined signs and symptoms indicating need for more acute intervention. Patient verbalized understanding. After Visit Summary given.    Procedures:      Robyn Haber, MD 07/31/17 1453    Robyn Haber, MD 07/31/17 [phone]

## 2017-07-31 NOTE — ED Triage Notes (Signed)
Pt states his legs got tangle, he states he is a fall risk, and he fell on his left hip.  He has pain to the hip and left ankle.

## 2017-07-31 NOTE — Consult Note (Signed)
Reason for Consult: Left hip fracture Referring Physician: Eliezer Mccoy, PA-C (EDP)  Melvin [name] is an 57 y.o. male.  HPI: The patient is a 57 year old gentleman with neuropathy and poorly controlled diabetes who sustained a mechanical fall earlier this week when he said his blood glucose was running low.  He landed hard on his left hip and somehow walked around on it until today when he had the inability to ambulate.  He was taken to the [facility] emergency room and x-rays were obtained showing a displaced left hip femoral neck fracture.  His left leg is noted to be shortened and externally rotated.  He does report significant left hip pain and inability to ambulate on the left hip.  He is someone who currently has a PICC line in his arm that he said was scheduled to be removed as he has been recovering from an infection of his left wrist.  He currently denies any other injuries.  He last ate just 2 hours ago.  He says that accounts for his high blood glucose is evening which was in the upper 300 range.  Orthopedic surgery is consulted to evaluate and treat the patient's displaced left hip fracture.  Past Medical History:  Diagnosis Date  . Anemia   . Angina   . CHF (congestive heart failure) ([place])   . Diabetes mellitus   . GERD (gastroesophageal reflux disease)   . Hyperlipidemia   . Meningitis   . Neuromuscular disorder ([place])    diabetic neuropathy  . Pancreatitis   . Pneumonia     Past Surgical History:  Procedure Laterality Date  . ERCP  08/02/2011   Procedure: ENDOSCOPIC RETROGRADE CHOLANGIOPANCREATOGRAPHY (ERCP);  Surgeon: Beryle Beams, MD;  Location: [facility] ENDOSCOPY;  Service: Endoscopy;  Laterality: N/A;  . ERCP  08/22/2011   Procedure: ENDOSCOPIC RETROGRADE CHOLANGIOPANCREATOGRAPHY (ERCP);  Surgeon: Beryle Beams, MD;  Location: [facility] ENDOSCOPY;  Service: Endoscopy;  Laterality: N/A;  . I&D EXTREMITY Left 06/10/2017   Procedure: IRRIGATION AND DEBRIDEMENT EXTREMITY, left  wrist;  Surgeon: Milly Jakob, MD;  Location: [facility];  Service: Orthopedics;  Laterality: Left;  . TONSILLECTOMY AND ADENOIDECTOMY     "as a kid"  . TYMPANOSTOMY TUBE PLACEMENT      Family History  Problem Relation Age of Onset  . Heart failure Mother   . Diabetes Mother   . Hypertension Mother   . Heart disease Father   . Heart failure Father   . Colon cancer Neg Hx   . Colon polyps Neg Hx   . Esophageal cancer Neg Hx   . Rectal cancer Neg Hx   . Stomach cancer Neg Hx     Social History:  reports that  has never smoked. he has never used smokeless tobacco. He reports that he drinks about 4.2 oz of alcohol per week. He reports that he uses drugs. Drug: Cocaine.  Allergies: No Known Allergies  Medications: I have reviewed the patient's current medications.  Results for orders placed or performed during the hospital encounter of 07/31/17 (from the past 48 hour(s))  Basic metabolic panel     Status: Abnormal   Collection Time: 07/31/17  6:55 PM  Result Value Ref Range   Sodium 137 135 - 145 mmol/L   Potassium 4.2 3.5 - 5.1 mmol/L   Chloride 112 (H) 101 - 111 mmol/L   CO2 18 (L) 22 - 32 mmol/L   Glucose, Bld 391 (H) 65 - 99 mg/dL   BUN 20 6 -  20 mg/dL   Creatinine, Ser 1.40 (H) 0.61 - 1.24 mg/dL   Calcium 9.0 8.9 - 10.3 mg/dL   GFR calc non Af Amer 54 (L) >60 mL/min   GFR calc Af Amer >60 >60 mL/min    Comment: (NOTE) The eGFR has been calculated using the CKD EPI equation. This calculation has not been validated in all clinical situations. eGFR's persistently <60 mL/min signify possible Chronic Kidney Disease.    Anion gap 7 5 - 15    Comment: Performed at [facility] [address], [place], [place] [address]  CBC with Differential     Status: Abnormal   Collection Time: 07/31/17  6:55 PM  Result Value Ref Range   WBC 5.3 4.0 - 10.5 K/uL   RBC 3.45 (L) 4.22 - 5.81 MIL/uL   Hemoglobin 10.9 (L) 13.0 - 17.0 g/dL   HCT 33.0 (L) 39.0 - 52.0 %   MCV 95.7 78.0  - 100.0 fL   MCH 31.6 26.0 - 34.0 pg   MCHC 33.0 30.0 - 36.0 g/dL   RDW 14.7 11.5 - 15.5 %   Platelets 145 (L) 150 - 400 K/uL   Neutrophils Relative % 59 %   Neutro Abs 3.1 1.7 - 7.7 K/uL   Lymphocytes Relative 24 %   Lymphs Abs 1.3 0.7 - 4.0 K/uL   Monocytes Relative 12 %   Monocytes Absolute 0.6 0.1 - 1.0 K/uL   Eosinophils Relative 5 %   Eosinophils Absolute 0.3 0.0 - 0.7 K/uL   Basophils Relative 0 %   Basophils Absolute 0.0 0.0 - 0.1 K/uL    Comment: Performed at [facility] [address], [place], [place] [address]  POC CBG, ED     Status: Abnormal   Collection Time: 07/31/17  7:05 PM  Result Value Ref Range   Glucose-Capillary 364 (H) 65 - 99 mg/dL    Dg Ankle Complete Left  Result Date: 07/31/2017 CLINICAL DATA:  Fall on Wednesday. Left hip fracture. Pain in left ankle. EXAM: LEFT ANKLE COMPLETE - 3+ VIEW COMPARISON:  Lower leg radiographs from 06/10/2017 FINDINGS: Mild spurring of the distal tibial rim. Small plantar calcaneal spur. Mild dorsal talonavicular spurring. No acute bony findings. IMPRESSION: 1. Mild hindfoot spurring. Small plantar calcaneal spur. No acute bony findings in the ankle. Electronically Signed   By: Van Clines M.D.   On: 07/31/2017 19:41   Dg Hip Unilat With Pelvis 2-3 Views Left  Result Date: 07/31/2017 CLINICAL DATA:  Left hip pain since a fall 3 days ago. Initial encounter. EXAM: DG HIP (WITH OR WITHOUT PELVIS) 2-3V LEFT COMPARISON:  None. FINDINGS: The patient has a left femoral neck fracture. The fracture appears to be subcapital. The femoral head is located. No other bony or joint abnormality is seen. IMPRESSION: Acute left femoral neck fracture appears to be subcapital. Electronically Signed   By: Inge Rise M.D.   On: 07/31/2017 15:28    Review of Systems  Constitutional: Positive for malaise/fatigue.  Neurological: Positive for weakness.  All other systems reviewed and are negative.  Blood pressure (!) 150/97, pulse  96, temperature 98.2 F (36.8 C), temperature source Oral, resp. rate 18, SpO2 99 %. Physical Exam  Constitutional: He is oriented to person, place, and time. He appears well-developed and well-nourished.  HENT:  Head: Normocephalic and atraumatic.  Eyes: Pupils are equal, round, and reactive to light.  Neck: Normal range of motion.  Cardiovascular: Normal rate.  Respiratory: Effort normal.  GI: Soft.  Musculoskeletal:       Left hip: He exhibits decreased range of motion, decreased strength, tenderness, bony tenderness and deformity.  Neurological: He is alert and oriented to person, place, and time.  Skin: Skin is warm and dry.    Assessment/Plan: Left hip with displaced femoral neck fracture  1) I spoke with the patient in length in detail and he understands the recommendation for displaced fracture such as this would be a total hip arthroplasty.  We will replace his hip through direct anterior approach.  I talked with him in detail about what the surgery involves including a discussion of the risk and benefits of surgery.  Obviously he is at a high risk of infection given his poorly controlled diabetes as well as the fact he is been dealing with other chronic infections in his body.  He will be admitted to the medicine service fortunately for continued control of his diabetes and blood pressure.  All questions concerns were answered and addressed.  He will be made n.p.o. after midnight.  Surgery is scheduled for first thing tomorrow morning.  Mcarthur Rossetti 07/31/2017, 8:21 PM

## 2017-07-31 NOTE — ED Provider Notes (Signed)
[facility] EMERGENCY DEPARTMENT Provider Note   CSN: [id number] Arrival date & time: 07/31/17  1553     History   Chief Complaint Chief Complaint  Patient presents with  . Fall    HPI Melvin [name] is a 57 y.o. male with history of diabetes with diabetic neuropathy, CHF, GERD who presents with a 2-day history of left hip pain after fall.  Patient reports going to urgent care today having an x-ray completed which showed a surgical neck fracture of his left femur.  He also reports left ankle pain.  Reports standing up and feeling lightheaded, causing him to.  He did not hit his head or pass out.  He reports that his blood sugar has been running low recently and he feels his blood sugar was low at that time.  He is not taking any medications at home for his symptoms.  He denies any new numbness or tingling, but does have some baseline sensation loss of his legs due to diabetic neuropathy.  He denies any chest pain, shortness of breath, abdominal pain, nausea, or vomiting.  HPI  Past Medical History:  Diagnosis Date  . Anemia   . Angina   . CHF (congestive heart failure) ([place])   . Diabetes mellitus   . GERD (gastroesophageal reflux disease)   . Hyperlipidemia   . Meningitis   . Neuromuscular disorder ([place])    diabetic neuropathy  . Pancreatitis   . Pneumonia     Patient Active Problem List   Diagnosis Date Noted  . Displaced fracture of base of neck of left femur, initial encounter for closed fracture ([place])   . Skin ulcer of left lower leg, limited to breakdown of skin ([place])   . Streptococcus viridans infection 06/14/2017  . Diabetes mellitus type 2, insulin dependent ([place])   . Diabetic ulcer of ankle ([place]) 06/11/2017  . Diabetic ulcer of left wrist ([place]) 06/11/2017  . Cellulitis of left forearm   . Abscess 06/10/2017  . Osteomyelitis ([place]) 06/10/2017  . Pancreatic insufficiency 02/13/2017  . AKI (acute kidney injury) ([place]) 06/18/2016  . Diabetes  mellitus without complication ([place]) 86/76/1952  . Chronic diastolic CHF (congestive heart failure) ([place]) 06/18/2016  . Diarrhea 06/18/2016  . Unilateral primary osteoarthritis, left knee 04/23/2016  . Hyperlipidemia 11/14/2014  . Erectile dysfunction 11/14/2014  . Diabetic neuropathy ([place]) 10/31/2014  . Hypoglycemia associated with diabetes ([place])   . Alcoholism ([place])   . Cocaine abuse ([place])   . Blurry vision, bilateral   . HSV-1 (herpes simplex virus 1) infection   . HSV-2 (herpes simplex virus 2) infection   . Congestive heart disease ([place])   . Essential hypertension   . DM w/o complication type II ([place])   . Hypothermia 10/24/2014  . Near syncope   . Weakness   . Dyspnea on exertion   . Palpitation   . Alcohol abuse   . Genital HSV   . ETOH abuse 07/28/2011  . Pulmonary nodule 05/30/2011  . Pancreatitis 05/28/2011  . Diabetes ([place]) 05/28/2011    Past Surgical History:  Procedure Laterality Date  . ERCP  08/02/2011   Procedure: ENDOSCOPIC RETROGRADE CHOLANGIOPANCREATOGRAPHY (ERCP);  Surgeon: Beryle Beams, MD;  Location: [facility] ENDOSCOPY;  Service: Endoscopy;  Laterality: N/A;  . ERCP  08/22/2011   Procedure: ENDOSCOPIC RETROGRADE CHOLANGIOPANCREATOGRAPHY (ERCP);  Surgeon: Beryle Beams, MD;  Location: [facility] ENDOSCOPY;  Service: Endoscopy;  Laterality: N/A;  . I&D EXTREMITY Left 06/10/2017   Procedure: IRRIGATION AND DEBRIDEMENT  EXTREMITY, left wrist;  Surgeon: Milly Jakob, MD;  Location: [facility];  Service: Orthopedics;  Laterality: Left;  . TONSILLECTOMY AND ADENOIDECTOMY     "as a kid"  . TYMPANOSTOMY TUBE PLACEMENT         Home Medications    Prior to Admission medications   Medication Sig Start Date End Date Taking? Authorizing Provider  ACCU-CHEK FASTCLIX LANCETS MISC Use as directed for 3 times daily testing of blood glucose. E11.9 10/09/16   Charlott Rakes, MD  ACCU-CHEK SOFTCLIX LANCETS lancets Use as instructed for 3 times daily testing of blood sugar. E11.9 09/23/16    Charlott Rakes, MD  Alcohol Swabs (B-D SINGLE USE SWABS REGULAR) PADS USE AS DIRECTED THREE TIMES DAILY  AND AT BEDTIME 12/30/16   Charlott Rakes, MD  aspirin 81 MG EC tablet Take 1 tablet (81 mg total) by mouth daily. 11/14/14   Charlott Rakes, MD  atorvastatin (LIPITOR) 10 MG tablet TAKE 1 TABLET ONE TIME DAILY  AT  6  P.M. 07/28/17   Charlott Rakes, MD  bacitracin ointment Apply topically 2 (two) times daily. 06/15/17   Santos-Sanchez, Merlene Morse, MD  Blood Glucose Monitoring Suppl (ACCU-CHEK NANO SMARTVIEW) w/Device KIT Use as directed for 3 times daily testing of blood glucose. 10/09/16   Charlott Rakes, MD  gabapentin (NEURONTIN) 300 MG capsule Take 2 capsules (600 mg total) by mouth 2 (two) times daily. 06/10/17   Charlott Rakes, MD  glucose blood (ACCU-CHEK SMARTVIEW) test strip Use as instructed for 3 times daily testing of blood glucose. E11.9 10/09/16   Charlott Rakes, MD  insulin aspart (NOVOLOG) 100 UNIT/ML injection 150-200, give 3 units 201-250 give 5 units 251-300 give 7 units 301-350 give 9 units 351-400 give 12 units Patient taking differently: Inject 3-12 Units into the skin See admin instructions. 3-12 units three times a day before meals PER SLIDING SCALE: BGL 150-200 = 3 units; 201-250 = 5 units; 251-300 = 7 units; 301-350 = 9 units; 351-400 = 12 units 06/10/17   Newlin, Enobong, MD  insulin glargine (LANTUS) 100 UNIT/ML injection Inject 0.3 mLs (30 Units total) into the skin at bedtime. 06/15/17   Colbert Ewing, MD  Insulin Syringe-Needle U-100 (B-D INS SYRINGE 0.5CC/31GX5/16) 31G X 5/16" 0.5 ML MISC USE TO INJECT INSULIN 3 TIMES A DAY AND AT BEDTIME 10/27/16   Charlott Rakes, MD  Lancet Devices [facility]) lancets Use as instructed for 3 times daily testing of blood sugar. E11.9 09/23/16   Charlott Rakes, MD  naproxen (NAPROSYN) 500 MG tablet TAKE 1 TABLET (500 MG TOTAL) BY MOUTH 2 (TWO) TIMES DAILY WITH A MEAL. Patient taking differently: Take 500 mg by mouth 2 (two) times  daily as needed (for pain).  04/28/17   Charlott Rakes, MD  silver sulfADIAZINE (SILVADENE) 1 % cream Apply 1 application topically daily. 11/11/16   Charlott Rakes, MD    Family History Family History  Problem Relation Age of Onset  . Heart failure Mother   . Diabetes Mother   . Hypertension Mother   . Heart disease Father   . Heart failure Father   . Colon cancer Neg Hx   . Colon polyps Neg Hx   . Esophageal cancer Neg Hx   . Rectal cancer Neg Hx   . Stomach cancer Neg Hx     Social History Social History   Tobacco Use  . Smoking status: Never Smoker  . Smokeless tobacco: Never Used  Substance Use Topics  . Alcohol use: Yes  Alcohol/week: 4.2 oz    Types: 3 Cans of beer, 4 Shots of liquor per week    Comment: couple times weekly  . Drug use: Yes    Types: Cocaine    Comment: pt reports last drug use X5 year ago      Allergies   Patient has no known allergies.   Review of Systems Review of Systems  Constitutional: Negative for chills and fever.  HENT: Negative for facial swelling and sore throat.   Respiratory: Negative for shortness of breath.   Cardiovascular: Negative for chest pain.  Gastrointestinal: Negative for abdominal pain, nausea and vomiting.  Genitourinary: Negative for dysuria.  Musculoskeletal: Positive for arthralgias (L hip and ankle). Negative for back pain.  Skin: Negative for rash and wound.  Neurological: Negative for headaches.  Psychiatric/Behavioral: The patient is not nervous/anxious.      Physical Exam Updated Vital Signs BP (!) 150/97   Pulse 96   Temp 98.2 F (36.8 C) (Oral)   Resp 18   SpO2 99%   Physical Exam  Constitutional: He appears well-developed and well-nourished. No distress.  HENT:  Head: Normocephalic and atraumatic.  Mouth/Throat: Oropharynx is clear and moist. No oropharyngeal exudate.  Eyes: Conjunctivae are normal. Pupils are equal, round, and reactive to light. Right eye exhibits no discharge. Left  eye exhibits no discharge. No scleral icterus.  Neck: Normal range of motion. Neck supple. No thyromegaly present.  Cardiovascular: Normal rate, regular rhythm, normal heart sounds and intact distal pulses. Exam reveals no gallop and no friction rub.  No murmur heard. Pulmonary/Chest: Effort normal and breath sounds normal. No stridor. No respiratory distress. He has no wheezes. He has no rales.  Abdominal: Soft. Bowel sounds are normal. He exhibits no distension. There is no tenderness. There is no rebound and no guarding.  Musculoskeletal: He exhibits no edema.       Left hip: He exhibits decreased range of motion, decreased strength, tenderness and bony tenderness (mild, anterior).  Shortening of the L leg; decreased ROM of L hip; decreased sensation to bilateral lower extremities; DP pulses intact bilaterally No bony tenderness to the left ankle, however decreased sensation secondary to diabetic neuropathy No midline cervical, thoracic, or lumbar tenderness  Lymphadenopathy:    He has no cervical adenopathy.  Neurological: He is alert. Coordination normal.  Skin: Skin is warm and dry. No rash noted. He is not diaphoretic. No pallor.  Psychiatric: He has a normal mood and affect.  Nursing note and vitals reviewed.    ED Treatments / Results  Labs (all labs ordered are listed, but only abnormal results are displayed) Labs Reviewed  BASIC METABOLIC PANEL - Abnormal; Notable for the following components:      Result Value   Chloride 112 (*)    CO2 18 (*)    Glucose, Bld 391 (*)    Creatinine, Ser 1.40 (*)    GFR calc non Af Amer 54 (*)    All other components within normal limits  CBC WITH DIFFERENTIAL/PLATELET - Abnormal; Notable for the following components:   RBC 3.45 (*)    Hemoglobin 10.9 (*)    HCT 33.0 (*)    Platelets 145 (*)    All other components within normal limits  CBG MONITORING, ED - Abnormal; Notable for the following components:   Glucose-Capillary 364 (*)     All other components within normal limits    EKG  EKG Interpretation None       Radiology Dg Ankle  Complete Left  Result Date: 07/31/2017 CLINICAL DATA:  Fall on Wednesday. Left hip fracture. Pain in left ankle. EXAM: LEFT ANKLE COMPLETE - 3+ VIEW COMPARISON:  Lower leg radiographs from 06/10/2017 FINDINGS: Mild spurring of the distal tibial rim. Small plantar calcaneal spur. Mild dorsal talonavicular spurring. No acute bony findings. IMPRESSION: 1. Mild hindfoot spurring. Small plantar calcaneal spur. No acute bony findings in the ankle. Electronically Signed   By: Van Clines M.D.   On: 07/31/2017 19:41   Dg Hip Unilat With Pelvis 2-3 Views Left  Result Date: 07/31/2017 CLINICAL DATA:  Left hip pain since a fall 3 days ago. Initial encounter. EXAM: DG HIP (WITH OR WITHOUT PELVIS) 2-3V LEFT COMPARISON:  None. FINDINGS: The patient has a left femoral neck fracture. The fracture appears to be subcapital. The femoral head is located. No other bony or joint abnormality is seen. IMPRESSION: Acute left femoral neck fracture appears to be subcapital. Electronically Signed   By: Inge Rise M.D.   On: 07/31/2017 15:28    Procedures Procedures (including critical care time)  Medications Ordered in ED Medications  insulin glargine (LANTUS) injection 30 Units (not administered)  sodium chloride 0.9 % bolus 500 mL (not administered)  morphine 4 MG/ML injection 4 mg (4 mg Intravenous Given 07/31/17 2021)     Initial Impression / Assessment and Plan / ED Course  I have reviewed the triage vital signs and the nursing notes.  Pertinent labs & imaging results that were available during my care of the patient were reviewed by me and considered in my medical decision making (see chart for details).  Clinical Course as of Aug 01 2027  Fri Jul 31, 2614  3557 57 year old male status post fall on Wednesday from a low blood sugar.  He has had persistent left hip pain since then.  Today he  was evaluated in urgent care for this and found to have a displaced hip fracture.  He was transferred here for further evaluation.  He is otherwise nontoxic-appearing.  We have reviewed the imaging with orthopedics and he will be admitted to medical service for a likely orthopedic repair of his hip tomorrow.  [MB]    Clinical Course User Index [MB] Hayden Rasmussen, MD    Patient with left femoral neck fracture appearing to be subcapital seen on x-ray at urgent care.  X-ray of the left ankle shows mild hindfoot spurring, small plantar calcaneal spur, no acute bony findings.  Patient denies hitting his head or losing consciousness.  Labs are stable, CBC shows hemoglobin 10.9, creatinine 1.4, glucose 391.  Patient reports he has had some episodes of hypoglycemia lately and feels that that is the reason he got dizzy and fell.  Patient is hyperglycemic here.  Patient given his nightly basal insulin, Lantus 30 units, in the ED.  I spoke with orthopedic doctor, Dr. Rush Farmer, who will evaluate the patient and plan for surgery at a later date.  He requests admission to medicine and patient can eat and drink today.  I spoke with Dr. Annia Friendly with [facility] who will admit the patient for further management.  Patient also evaluated by Dr. Melina Copa who agrees with plan.  Final Clinical Impressions(s) / ED Diagnoses   Final diagnoses:  Closed fracture of left hip, initial encounter [facility])    ED Discharge Orders    None       Frederica Kuster, PA-C 07/31/17 2030    Hayden Rasmussen, MD 08/01/17 1058

## 2017-07-31 NOTE — ED Triage Notes (Signed)
Pt states he fell on Wednesday. Seen at [facility] and dx with hip fracture. Pt sitting in wheelchair. No distress noted.

## 2017-07-31 NOTE — H&P (Addendum)
History and Physical    NASIAH LEHENBAUER JKK:[id number] DOB: 1961/03/10 DOA: 07/31/2017  PCP: Lenise Herald Patient coming from: home   Chief Complaint: left hip pain  HPI: ANTIONO ETTINGER is a 57 y.o. male with medical history significant for poorly-controlled diabetes, recent left distal ulnar osteomyelitis s/p 6 wks ceftriaxone that ended 2/12, pancreatic insufficiency and hx pancreatitis, neuropathy, who presents after a fall 2 days ago at home. Says suffered from hypoglycemia that day (sweating, palpitations, did not check blood sugar), became weak and fell, landing on left hip. No loc, no head trauma. Painful, but able to ambulate with a cane. Home health aid came today and told him likely bruised but if pain did not improve to come to ED. As pain no better today, came to ED. Otherwise feeling his "normal self." No increased urination, no abdominal pain, no fevers, no cough, no dysuria. Drinks 2 beers/day, hx of heavy drinking in the past. Denies recent drug use. Does not smoke.   ED Course: labs, x-ray, orthopedics consult  Review of Systems: As per HPI otherwise 10 point review of systems negative.    Past Medical History:  Diagnosis Date  . Anemia   . Angina   . CHF (congestive heart failure) ([place])   . Diabetes mellitus   . GERD (gastroesophageal reflux disease)   . Hyperlipidemia   . Meningitis   . Neuromuscular disorder ([place])    diabetic neuropathy  . Pancreatitis   . Pneumonia     Past Surgical History:  Procedure Laterality Date  . ERCP  08/02/2011   Procedure: ENDOSCOPIC RETROGRADE CHOLANGIOPANCREATOGRAPHY (ERCP);  Surgeon: Beryle Beams, MD;  Location: [facility] ENDOSCOPY;  Service: Endoscopy;  Laterality: N/A;  . ERCP  08/22/2011   Procedure: ENDOSCOPIC RETROGRADE CHOLANGIOPANCREATOGRAPHY (ERCP);  Surgeon: Beryle Beams, MD;  Location: [facility] ENDOSCOPY;  Service: Endoscopy;  Laterality: N/A;  . I&D EXTREMITY Left 06/10/2017   Procedure: IRRIGATION AND DEBRIDEMENT EXTREMITY,  left wrist;  Surgeon: Milly Jakob, MD;  Location: [facility];  Service: Orthopedics;  Laterality: Left;  . TONSILLECTOMY AND ADENOIDECTOMY     "as a kid"  . TYMPANOSTOMY TUBE PLACEMENT       reports that  has never smoked. he has never used smokeless tobacco. He reports that he drinks about 4.2 oz of alcohol per week. He reports that he uses drugs. Drug: Cocaine.  No Known Allergies  Family History  Problem Relation Age of Onset  . Heart failure Mother   . Diabetes Mother   . Hypertension Mother   . Heart disease Father   . Heart failure Father   . Colon cancer Neg Hx   . Colon polyps Neg Hx   . Esophageal cancer Neg Hx   . Rectal cancer Neg Hx   . Stomach cancer Neg Hx     Prior to Admission medications   Medication Sig Start Date End Date Taking? Authorizing Provider  ACCU-CHEK FASTCLIX LANCETS MISC Use as directed for 3 times daily testing of blood glucose. E11.9 10/09/16   Charlott Rakes, MD  ACCU-CHEK SOFTCLIX LANCETS lancets Use as instructed for 3 times daily testing of blood sugar. E11.9 09/23/16   Charlott Rakes, MD  Alcohol Swabs (B-D SINGLE USE SWABS REGULAR) PADS USE AS DIRECTED THREE TIMES DAILY  AND AT BEDTIME 12/30/16   Charlott Rakes, MD  aspirin 81 MG EC tablet Take 1 tablet (81 mg total) by mouth daily. 11/14/14   Charlott Rakes, MD  atorvastatin (LIPITOR) 10 MG tablet TAKE 1  TABLET ONE TIME DAILY  AT  6  P.M. 07/28/17   Charlott Rakes, MD  bacitracin ointment Apply topically 2 (two) times daily. 06/15/17   Santos-Sanchez, Merlene Morse, MD  Blood Glucose Monitoring Suppl (ACCU-CHEK NANO SMARTVIEW) w/Device KIT Use as directed for 3 times daily testing of blood glucose. 10/09/16   Charlott Rakes, MD  gabapentin (NEURONTIN) 300 MG capsule Take 2 capsules (600 mg total) by mouth 2 (two) times daily. 06/10/17   Charlott Rakes, MD  glucose blood (ACCU-CHEK SMARTVIEW) test strip Use as instructed for 3 times daily testing of blood glucose. E11.9 10/09/16   Charlott Rakes, MD  insulin  aspart (NOVOLOG) 100 UNIT/ML injection 150-200, give 3 units 201-250 give 5 units 251-300 give 7 units 301-350 give 9 units 351-400 give 12 units Patient taking differently: Inject 3-12 Units into the skin See admin instructions. 3-12 units three times a day before meals PER SLIDING SCALE: BGL 150-200 = 3 units; 201-250 = 5 units; 251-300 = 7 units; 301-350 = 9 units; 351-400 = 12 units 06/10/17   Newlin, Enobong, MD  insulin glargine (LANTUS) 100 UNIT/ML injection Inject 0.3 mLs (30 Units total) into the skin at bedtime. 06/15/17   Colbert Ewing, MD  Insulin Syringe-Needle U-100 (B-D INS SYRINGE 0.5CC/31GX5/16) 31G X 5/16" 0.5 ML MISC USE TO INJECT INSULIN 3 TIMES A DAY AND AT BEDTIME 10/27/16   Charlott Rakes, MD  Lancet Devices [facility]) lancets Use as instructed for 3 times daily testing of blood sugar. E11.9 09/23/16   Charlott Rakes, MD  naproxen (NAPROSYN) 500 MG tablet TAKE 1 TABLET (500 MG TOTAL) BY MOUTH 2 (TWO) TIMES DAILY WITH A MEAL. Patient taking differently: Take 500 mg by mouth 2 (two) times daily as needed (for pain).  04/28/17   Charlott Rakes, MD  silver sulfADIAZINE (SILVADENE) 1 % cream Apply 1 application topically daily. 11/11/16   Charlott Rakes, MD    Physical Exam: Vitals:   07/31/17 1645 07/31/17 1906  BP: 101/65 (!) 150/97  Pulse: 91 96  Resp: 18   Temp: 98.2 F (36.8 C)   TempSrc: Oral   SpO2: 100% 99%    Constitutional: No acute distress Head: Atraumatic Eyes: Conjunctiva clear ENM: Moist mucous membranes. Normal dentition.  Neck: Supple Respiratory: Clear to auscultation bilaterally, no wheezing/rales/rhonchi. Normal respiratory effort. No accessory muscle use. . Cardiovascular: Regular rate and rhythm. No murmurs/rubs/gallops. Abdomen: Non-tender, non-distended. No masses. No rebound or guarding. Positive bowel sounds. Musculoskeletal: left leg externally rotated Skin: No rashes, lesions, or ulcers.  Extremities: No peripheral edema. Palpable  peripheral pulses. Neurologic: Alert, moving all 4 extremities. Distal sensation intact in left lower extremity Psychiatric: Normal insight and judgement.   Labs on Admission: I have personally reviewed following labs and imaging studies  CBC: Recent Labs  Lab 07/31/17 1855  WBC 5.3  NEUTROABS 3.1  HGB 10.9*  HCT 33.0*  MCV 95.7  PLT 606*   Basic Metabolic Panel: Recent Labs  Lab 07/31/17 1855  NA 137  K 4.2  CL 112*  CO2 18*  GLUCOSE 391*  BUN 20  CREATININE 1.40*  CALCIUM 9.0   GFR: Estimated Creatinine Clearance: 55.6 mL/min (A) (by C-G formula based on SCr of 1.4 mg/dL (H)). Liver Function Tests: No results for input(s): AST, ALT, ALKPHOS, BILITOT, PROT, ALBUMIN in the last 168 hours. No results for input(s): LIPASE, AMYLASE in the last 168 hours. No results for input(s): AMMONIA in the last 168 hours. Coagulation Profile: No results for input(s): INR, PROTIME  in the last 168 hours. Cardiac Enzymes: No results for input(s): CKTOTAL, CKMB, CKMBINDEX, TROPONINI in the last 168 hours. BNP (last 3 results) No results for input(s): PROBNP in the last 8760 hours. HbA1C: No results for input(s): HGBA1C in the last 72 hours. CBG: Recent Labs  Lab 07/31/17 1905  GLUCAP 364*   Lipid Profile: No results for input(s): CHOL, HDL, LDLCALC, TRIG, CHOLHDL, LDLDIRECT in the last 72 hours. Thyroid Function Tests: No results for input(s): TSH, T4TOTAL, FREET4, T3FREE, THYROIDAB in the last 72 hours. Anemia Panel: No results for input(s): VITAMINB12, FOLATE, FERRITIN, TIBC, IRON, RETICCTPCT in the last 72 hours. Urine analysis:    Component Value Date/Time   COLORURINE AMBER (A) 06/10/2017 1055   APPEARANCEUR HAZY (A) 06/10/2017 1055   LABSPEC 1.020 06/10/2017 1055   PHURINE 5.0 06/10/2017 1055   GLUCOSEU 50 (A) 06/10/2017 1055   HGBUR MODERATE (A) 06/10/2017 1055   BILIRUBINUR NEGATIVE 06/10/2017 1055   BILIRUBINUR small 05/28/2015 1612   KETONESUR 5 (A)  06/10/2017 1055   PROTEINUR 100 (A) 06/10/2017 1055   UROBILINOGEN 1.0 05/28/2015 1612   UROBILINOGEN 1.0 10/24/2014 1215   NITRITE NEGATIVE 06/10/2017 1055   LEUKOCYTESUR NEGATIVE 06/10/2017 1055    Radiological Exams on Admission: Dg Ankle Complete Left  Result Date: 07/31/2017 CLINICAL DATA:  Fall on Wednesday. Left hip fracture. Pain in left ankle. EXAM: LEFT ANKLE COMPLETE - 3+ VIEW COMPARISON:  Lower leg radiographs from 06/10/2017 FINDINGS: Mild spurring of the distal tibial rim. Small plantar calcaneal spur. Mild dorsal talonavicular spurring. No acute bony findings. IMPRESSION: 1. Mild hindfoot spurring. Small plantar calcaneal spur. No acute bony findings in the ankle. Electronically Signed   By: Van Clines M.D.   On: 07/31/2017 19:41   Dg Hip Unilat With Pelvis 2-3 Views Left  Result Date: 07/31/2017 CLINICAL DATA:  Left hip pain since a fall 3 days ago. Initial encounter. EXAM: DG HIP (WITH OR WITHOUT PELVIS) 2-3V LEFT COMPARISON:  None. FINDINGS: The patient has a left femoral neck fracture. The fracture appears to be subcapital. The femoral head is located. No other bony or joint abnormality is seen. IMPRESSION: Acute left femoral neck fracture appears to be subcapital. Electronically Signed   By: Inge Rise M.D.   On: 07/31/2017 15:28    Assessment/Plan Principal Problem:   Displaced fracture of base of neck of left femur, initial encounter for closed fracture ([place]) Active Problems:   Diabetes ([place])   ETOH abuse   Essential hypertension   Pancreatic insufficiency   Osteomyelitis ([place])   # left closed femoral neck fracture - ortho (Dr. Ninfa Linden) consulted, plan for OR tomorrow morning. Neurovascularly intact LLE.  - npo after midnight - type and screen - fluids as below - holding pharmacologic dvt ppx  - morphine for pain  # Type 2 diabetes mellitus # hyperglycemia - here glucose 300s, bicarb 18, gap normal, asymptomatic. - home dose of lantus 30 units  ordered - SSI - NS 500 ml bolus, then @ 125/hr  # AKI - mild, here cr 1.4, baseline 1.1. Favor mild dehydration, perhaps 2/2 limited mobility last 2 days - fluids as above - bmp in am  # Right picc line - placed for osteomyelitis iv abx, now s/p that. Due to be removed - d/c prior to discharge  # hypertension - not on home meds. Here bp moderately elevated to 150/97, is in pain - pain control as above - ctm, may need to institute antihypertensive prior to d/c  #  Thrombocytopenia - chronic. hiv negative. Mild. Possibly 2/2 etoh use. 6/18 u/s did not show signs of cirrhosis. ITP? No signs infection. - f/u hcv, vitamin b12, h pylori serology  DVT prophylaxis: SCDs, resume pharmacologic after surgery Code Status: full  Family Communication: daughter lynette Satara Virella [phone]  Disposition Plan: tbd  Consults called: orthopedis dr. Ninfa Linden  Admission status: med/surg    Desma Maxim MD [facility] Pager [phone]  If 7PM-7AM, please contact night-coverage [email or web address] Password [facility]  07/31/2017, 8:43 PM

## 2017-08-01 ENCOUNTER — Inpatient Hospital Stay (HOSPITAL_COMMUNITY): Payer: Medicare HMO | Admitting: Anesthesiology

## 2017-08-01 ENCOUNTER — Encounter (HOSPITAL_COMMUNITY)
Admission: EM | Disposition: A | Discharge status: Home Health | Payer: Self-pay | Source: Home / Self Care | Attending: Internal Medicine

## 2017-08-01 ENCOUNTER — Inpatient Hospital Stay (HOSPITAL_COMMUNITY): Payer: Medicare HMO

## 2017-08-01 DIAGNOSIS — I10 Essential (primary) hypertension: Secondary | ICD-10-CM

## 2017-08-01 DIAGNOSIS — D696 Thrombocytopenia, unspecified: Secondary | ICD-10-CM

## 2017-08-01 HISTORY — PX: TOTAL HIP ARTHROPLASTY: SHX124

## 2017-08-01 LAB — BASIC METABOLIC PANEL
ANION GAP: 5 (ref 5–15)
BUN: 17 mg/dL (ref 6–20)
CHLORIDE: 111 mmol/L (ref 101–111)
CO2: 20 mmol/L — ABNORMAL LOW (ref 22–32)
CREATININE: 1.11 mg/dL (ref 0.61–1.24)
Calcium: 8.6 mg/dL — ABNORMAL LOW (ref 8.9–10.3)
GFR calc non Af Amer: >60 mL/min (ref >60)
Glucose, Bld: 109 mg/dL — ABNORMAL HIGH (ref 65–99)
Potassium: 3.6 mmol/L (ref 3.5–5.1)
SODIUM: 136 mmol/L (ref 135–145)

## 2017-08-01 LAB — GLUCOSE, CAPILLARY
GLUCOSE-CAPILLARY: 83 mg/dL (ref 65–99)
GLUCOSE-CAPILLARY: 260 mg/dL — AB (ref 65–99)
Glucose-Capillary: 85 mg/dL (ref 65–99)
Glucose-Capillary: 131 mg/dL — ABNORMAL HIGH (ref 65–99)
Glucose-Capillary: 307 mg/dL — ABNORMAL HIGH (ref 65–99)

## 2017-08-01 LAB — VITAMIN B12: VITAMIN B 12: 123 pg/mL — AB (ref 180–914)

## 2017-08-01 LAB — ABO/RH: ABO/RH(D): O POS

## 2017-08-01 SURGERY — ARTHROPLASTY, HIP, TOTAL, ANTERIOR APPROACH
Anesthesia: General | Site: Hip | Laterality: Left

## 2017-08-01 MED ORDER — FAMOTIDINE 20 MG PO TABS
10.0000 mg | ORAL_TABLET | Freq: Three times a day (TID) | ORAL | Status: DC
  Administered 2017-08-01 – 2017-08-04 (×9): 10 mg via ORAL
  Filled 2017-08-01 (×9): qty 1

## 2017-08-01 MED ORDER — DOCUSATE SODIUM 100 MG PO CAPS
100.0000 mg | ORAL_CAPSULE | Freq: Two times a day (BID) | ORAL | Status: DC
  Administered 2017-08-01 – 2017-08-04 (×7): 100 mg via ORAL
  Filled 2017-08-01 (×7): qty 1

## 2017-08-01 MED ORDER — PHENYLEPHRINE HCL 10 MG/ML IJ SOLN
INTRAVENOUS | Status: DC | PRN
  Administered 2017-08-01: 40 ug/min via INTRAVENOUS

## 2017-08-01 MED ORDER — VANCOMYCIN HCL IN DEXTROSE 1-5 GM/200ML-% IV SOLN
1000.0000 mg | Freq: Two times a day (BID) | INTRAVENOUS | Status: DC
  Filled 2017-08-01: qty 200

## 2017-08-01 MED ORDER — ENOXAPARIN SODIUM 40 MG/0.4ML ~~LOC~~ SOLN
40.0000 mg | SUBCUTANEOUS | Status: DC
  Administered 2017-08-01 – 2017-08-03 (×3): 40 mg via SUBCUTANEOUS
  Filled 2017-08-01 (×3): qty 0.4

## 2017-08-01 MED ORDER — SODIUM CHLORIDE 0.9 % IV SOLN: INTRAVENOUS | Status: DC

## 2017-08-01 MED ORDER — SUGAMMADEX SODIUM 200 MG/2ML IV SOLN
INTRAVENOUS | Status: AC
  Filled 2017-08-01: qty 2

## 2017-08-01 MED ORDER — PHENOL 1.4 % MT LIQD: 1.0000 | OROMUCOSAL | Status: DC | PRN

## 2017-08-01 MED ORDER — MENTHOL 3 MG MT LOZG: 1.0000 | LOZENGE | OROMUCOSAL | Status: DC | PRN

## 2017-08-01 MED ORDER — METOCLOPRAMIDE HCL 5 MG/ML IJ SOLN: 5.0000 mg | Freq: Three times a day (TID) | INTRAMUSCULAR | Status: DC | PRN

## 2017-08-01 MED ORDER — ONDANSETRON HCL 4 MG/2ML IJ SOLN
INTRAMUSCULAR | Status: DC | PRN
  Administered 2017-08-01: 4 mg via INTRAVENOUS

## 2017-08-01 MED ORDER — ONDANSETRON HCL 4 MG/2ML IJ SOLN
INTRAMUSCULAR | Status: AC
  Filled 2017-08-01: qty 2

## 2017-08-01 MED ORDER — ROCURONIUM BROMIDE 100 MG/10ML IV SOLN
INTRAVENOUS | Status: DC | PRN
  Administered 2017-08-01: 50 mg via INTRAVENOUS
  Administered 2017-08-01: 10 mg via INTRAVENOUS

## 2017-08-01 MED ORDER — ASPIRIN 81 MG PO CHEW
81.0000 mg | CHEWABLE_TABLET | Freq: Two times a day (BID) | ORAL | Status: DC
  Administered 2017-08-01 – 2017-08-04 (×6): 81 mg via ORAL
  Filled 2017-08-01 (×6): qty 1

## 2017-08-01 MED ORDER — OXYCODONE HCL 5 MG/5ML PO SOLN: 5.0000 mg | Freq: Once | ORAL | Status: DC | PRN

## 2017-08-01 MED ORDER — ACETAMINOPHEN 325 MG PO TABS
650.0000 mg | ORAL_TABLET | ORAL | Status: DC | PRN
  Filled 2017-08-01: qty 2

## 2017-08-01 MED ORDER — OXYCODONE HCL 5 MG PO TABS: 5.0000 mg | ORAL_TABLET | Freq: Once | ORAL | Status: DC | PRN

## 2017-08-01 MED ORDER — VANCOMYCIN HCL IN DEXTROSE 1-5 GM/200ML-% IV SOLN
1000.0000 mg | Freq: Once | INTRAVENOUS | Status: AC
  Administered 2017-08-01: 1000 mg via INTRAVENOUS
  Filled 2017-08-01: qty 200

## 2017-08-01 MED ORDER — ONDANSETRON HCL 4 MG/2ML IJ SOLN
4.0000 mg | Freq: Four times a day (QID) | INTRAMUSCULAR | Status: DC | PRN
  Filled 2017-08-01: qty 2

## 2017-08-01 MED ORDER — PROPOFOL 10 MG/ML IV BOLUS
INTRAVENOUS | Status: AC
  Filled 2017-08-01: qty 20

## 2017-08-01 MED ORDER — SUFENTANIL CITRATE 50 MCG/ML IV SOLN
INTRAVENOUS | Status: DC | PRN
  Administered 2017-08-01 (×3): 5 ug via INTRAVENOUS
  Administered 2017-08-01: 15 ug via INTRAVENOUS

## 2017-08-01 MED ORDER — HYDROMORPHONE HCL 1 MG/ML IJ SOLN
INTRAMUSCULAR | Status: AC
  Administered 2017-08-01: 0.5 mg via INTRAVENOUS
  Filled 2017-08-01: qty 1

## 2017-08-01 MED ORDER — PHENYLEPHRINE 40 MCG/ML (10ML) SYRINGE FOR IV PUSH (FOR BLOOD PRESSURE SUPPORT)
PREFILLED_SYRINGE | INTRAVENOUS | Status: AC
  Filled 2017-08-01: qty 10

## 2017-08-01 MED ORDER — ACETAMINOPHEN 650 MG RE SUPP
650.0000 mg | RECTAL | Status: DC | PRN
  Filled 2017-08-01: qty 1

## 2017-08-01 MED ORDER — SODIUM CHLORIDE 0.9 % IR SOLN
Status: DC | PRN
  Administered 2017-08-01: 3000 mL

## 2017-08-01 MED ORDER — DIPHENHYDRAMINE HCL 12.5 MG/5ML PO ELIX: 12.5000 mg | ORAL_SOLUTION | ORAL | Status: DC | PRN

## 2017-08-01 MED ORDER — PROPOFOL 10 MG/ML IV BOLUS
INTRAVENOUS | Status: DC | PRN
  Administered 2017-08-01: 20 mg via INTRAVENOUS
  Administered 2017-08-01: 130 mg via INTRAVENOUS
  Administered 2017-08-01: 30 mg via INTRAVENOUS

## 2017-08-01 MED ORDER — METOCLOPRAMIDE HCL 5 MG PO TABS: 5.0000 mg | ORAL_TABLET | Freq: Three times a day (TID) | ORAL | Status: DC | PRN

## 2017-08-01 MED ORDER — MIDAZOLAM HCL 5 MG/5ML IJ SOLN
INTRAMUSCULAR | Status: DC | PRN
  Administered 2017-08-01: 2 mg via INTRAVENOUS

## 2017-08-01 MED ORDER — ONDANSETRON HCL 4 MG PO TABS
4.0000 mg | ORAL_TABLET | Freq: Four times a day (QID) | ORAL | Status: DC | PRN
  Filled 2017-08-01: qty 1

## 2017-08-01 MED ORDER — VANCOMYCIN HCL IN DEXTROSE 1-5 GM/200ML-% IV SOLN
INTRAVENOUS | Status: AC
  Filled 2017-08-01: qty 200

## 2017-08-01 MED ORDER — METHOCARBAMOL 500 MG PO TABS
500.0000 mg | ORAL_TABLET | Freq: Four times a day (QID) | ORAL | Status: DC | PRN
  Administered 2017-08-01 – 2017-08-04 (×3): 500 mg via ORAL
  Filled 2017-08-01 (×4): qty 1

## 2017-08-01 MED ORDER — DEXAMETHASONE SODIUM PHOSPHATE 10 MG/ML IJ SOLN
INTRAMUSCULAR | Status: AC
  Filled 2017-08-01: qty 1

## 2017-08-01 MED ORDER — SUGAMMADEX SODIUM 200 MG/2ML IV SOLN
INTRAVENOUS | Status: DC | PRN
  Administered 2017-08-01: 150 mg via INTRAVENOUS

## 2017-08-01 MED ORDER — HYDROMORPHONE HCL 1 MG/ML IJ SOLN
0.2500 mg | INTRAMUSCULAR | Status: DC | PRN
  Administered 2017-08-01 (×4): 0.5 mg via INTRAVENOUS

## 2017-08-01 MED ORDER — 0.9 % SODIUM CHLORIDE (POUR BTL) OPTIME
TOPICAL | Status: DC | PRN
Start: 2017-08-01 — End: 2017-08-01
  Administered 2017-08-01: 1000 mL

## 2017-08-01 MED ORDER — ONDANSETRON HCL 4 MG/2ML IJ SOLN: 4.0000 mg | Freq: Four times a day (QID) | INTRAMUSCULAR | Status: DC | PRN

## 2017-08-01 MED ORDER — SUCCINYLCHOLINE CHLORIDE 200 MG/10ML IV SOSY
PREFILLED_SYRINGE | INTRAVENOUS | Status: AC
  Filled 2017-08-01: qty 10

## 2017-08-01 MED ORDER — CHLORHEXIDINE GLUCONATE 4 % EX LIQD
CUTANEOUS | Status: AC
  Administered 2017-08-01: 04:00:00
  Filled 2017-08-01: qty 15

## 2017-08-01 MED ORDER — HYDROCODONE-ACETAMINOPHEN 5-325 MG PO TABS
1.0000 | ORAL_TABLET | ORAL | Status: DC | PRN
  Administered 2017-08-03 – 2017-08-04 (×4): 2 via ORAL
  Filled 2017-08-01 (×4): qty 2

## 2017-08-01 MED ORDER — CHLORHEXIDINE GLUCONATE 4 % EX LIQD: 60.0000 mL | Freq: Once | CUTANEOUS | Status: DC

## 2017-08-01 MED ORDER — EPHEDRINE 5 MG/ML INJ
INTRAVENOUS | Status: AC
  Filled 2017-08-01: qty 10

## 2017-08-01 MED ORDER — ZOLPIDEM TARTRATE 5 MG PO TABS
5.0000 mg | ORAL_TABLET | Freq: Every evening | ORAL | Status: DC | PRN
Start: 2017-08-01 — End: 2017-08-04

## 2017-08-01 MED ORDER — DEXAMETHASONE SODIUM PHOSPHATE 10 MG/ML IJ SOLN
INTRAMUSCULAR | Status: DC | PRN
  Administered 2017-08-01: 5 mg via INTRAVENOUS

## 2017-08-01 MED ORDER — LIDOCAINE 2% (20 MG/ML) 5 ML SYRINGE
INTRAMUSCULAR | Status: AC
  Filled 2017-08-01: qty 5

## 2017-08-01 MED ORDER — HYDROMORPHONE HCL 1 MG/ML IJ SOLN: 1.0000 mg | INTRAMUSCULAR | Status: DC | PRN

## 2017-08-01 MED ORDER — ROCURONIUM BROMIDE 10 MG/ML (PF) SYRINGE
PREFILLED_SYRINGE | INTRAVENOUS | Status: AC
  Filled 2017-08-01: qty 5

## 2017-08-01 MED ORDER — MIDAZOLAM HCL 2 MG/2ML IJ SOLN
INTRAMUSCULAR | Status: AC
  Filled 2017-08-01: qty 2

## 2017-08-01 MED ORDER — SODIUM CHLORIDE 0.9 % IJ SOLN
INTRAMUSCULAR | Status: AC
  Filled 2017-08-01: qty 10

## 2017-08-01 MED ORDER — SUFENTANIL CITRATE 50 MCG/ML IV SOLN
INTRAVENOUS | Status: AC
  Filled 2017-08-01: qty 1

## 2017-08-01 MED ORDER — VANCOMYCIN HCL IN DEXTROSE 1-5 GM/200ML-% IV SOLN
1000.0000 mg | INTRAVENOUS | Status: AC
  Administered 2017-08-01: 1000 mg via INTRAVENOUS

## 2017-08-01 MED ORDER — METHOCARBAMOL 500 MG PO TABS
ORAL_TABLET | ORAL | Status: AC
  Filled 2017-08-01: qty 1

## 2017-08-01 MED ORDER — LACTATED RINGERS IV SOLN
INTRAVENOUS | Status: DC | PRN
  Administered 2017-08-01 (×2): via INTRAVENOUS

## 2017-08-01 MED ORDER — METHOCARBAMOL 1000 MG/10ML IJ SOLN
500.0000 mg | Freq: Four times a day (QID) | INTRAVENOUS | Status: DC | PRN
  Filled 2017-08-01: qty 5

## 2017-08-01 MED ORDER — OXYCODONE HCL 5 MG PO TABS
10.0000 mg | ORAL_TABLET | ORAL | Status: DC | PRN
  Administered 2017-08-01 – 2017-08-04 (×11): 10 mg via ORAL
  Filled 2017-08-01 (×11): qty 2

## 2017-08-01 SURGICAL SUPPLY — 58 items
APL SKNCLS STERI-STRIP NONHPOA (GAUZE/BANDAGES/DRESSINGS) ×1
BENZOIN TINCTURE PRP APPL 2/3 (GAUZE/BANDAGES/DRESSINGS) ×3 IMPLANT
BLADE CLIPPER SURG (BLADE) IMPLANT
BLADE SAW SGTL 18X1.27X75 (BLADE) ×2 IMPLANT
BLADE SAW SGTL 18X1.27X75MM (BLADE) ×1
CAPT HIP TOTAL 2 ×2 IMPLANT
CELLS DAT CNTRL 66122 CELL SVR (MISCELLANEOUS) ×1 IMPLANT
CLEANER TIP ELECTROSURG 2X2 (MISCELLANEOUS) ×2 IMPLANT
CLOSURE WOUND 1/2 X4 (GAUZE/BANDAGES/DRESSINGS) ×2
COVER SURGICAL LIGHT HANDLE (MISCELLANEOUS) ×3 IMPLANT
DRAPE C-ARM 42X72 X-RAY (DRAPES) ×3 IMPLANT
DRAPE STERI IOBAN 125X83 (DRAPES) ×3 IMPLANT
DRAPE U-SHAPE 47X51 STRL (DRAPES) ×9 IMPLANT
DRSG AQUACEL AG ADV 3.5X10 (GAUZE/BANDAGES/DRESSINGS) ×3 IMPLANT
DURAPREP 26ML APPLICATOR (WOUND CARE) ×3 IMPLANT
ELECT BLADE 4.0 EZ CLEAN MEGAD (MISCELLANEOUS) ×3
ELECT BLADE 6.5 EXT (BLADE) IMPLANT
ELECT REM PT RETURN 9FT ADLT (ELECTROSURGICAL) ×3
ELECTRODE BLDE 4.0 EZ CLN MEGD (MISCELLANEOUS) ×1 IMPLANT
ELECTRODE REM PT RTRN 9FT ADLT (ELECTROSURGICAL) ×1 IMPLANT
FACESHIELD WRAPAROUND (MASK) ×6 IMPLANT
FACESHIELD WRAPAROUND OR TEAM (MASK) ×2 IMPLANT
GAUZE XEROFORM 1X8 LF (GAUZE/BANDAGES/DRESSINGS) ×2 IMPLANT
GLOVE BIOGEL PI IND STRL 8 (GLOVE) ×2 IMPLANT
GLOVE BIOGEL PI INDICATOR 8 (GLOVE) ×4
GLOVE ECLIPSE 8.0 STRL XLNG CF (GLOVE) ×3 IMPLANT
GLOVE ORTHO TXT STRL SZ7.5 (GLOVE) ×6 IMPLANT
GOWN STRL REUS W/ TWL LRG LVL3 (GOWN DISPOSABLE) ×2 IMPLANT
GOWN STRL REUS W/ TWL XL LVL3 (GOWN DISPOSABLE) ×2 IMPLANT
GOWN STRL REUS W/TWL LRG LVL3 (GOWN DISPOSABLE) ×6
GOWN STRL REUS W/TWL XL LVL3 (GOWN DISPOSABLE) ×6
HANDPIECE INTERPULSE COAX TIP (DISPOSABLE) ×3
KIT BASIN OR (CUSTOM PROCEDURE TRAY) ×3 IMPLANT
KIT ROOM TURNOVER OR (KITS) ×3 IMPLANT
MANIFOLD NEPTUNE II (INSTRUMENTS) ×3 IMPLANT
NS IRRIG 1000ML POUR BTL (IV SOLUTION) ×3 IMPLANT
PACK TOTAL JOINT (CUSTOM PROCEDURE TRAY) ×3 IMPLANT
PAD ARMBOARD 7.5X6 YLW CONV (MISCELLANEOUS) ×3 IMPLANT
PIN SECTOR W/GRIP ACE CUP 52MM (Hips) ×2 IMPLANT
RETRACTOR WND ALEXIS 18 MED (MISCELLANEOUS) ×1 IMPLANT
RTRCTR WOUND ALEXIS 18CM MED (MISCELLANEOUS) ×3
SET HNDPC FAN SPRY TIP SCT (DISPOSABLE) ×1 IMPLANT
SPONGE LAP 18X18 5 PK (GAUZE/BANDAGES/DRESSINGS) ×2 IMPLANT
STAPLER VISISTAT 35W (STAPLE) IMPLANT
STRIP CLOSURE SKIN 1/2X4 (GAUZE/BANDAGES/DRESSINGS) ×4 IMPLANT
SUT ETHIBOND NAB CT1 #1 30IN (SUTURE) ×5 IMPLANT
SUT MNCRL AB 4-0 PS2 18 (SUTURE) IMPLANT
SUT VIC AB 0 CT1 27 (SUTURE) ×9
SUT VIC AB 0 CT1 27XBRD ANBCTR (SUTURE) ×1 IMPLANT
SUT VIC AB 1 CT1 27 (SUTURE) ×3
SUT VIC AB 1 CT1 27XBRD ANBCTR (SUTURE) ×1 IMPLANT
SUT VIC AB 2-0 CT1 27 (SUTURE) ×3
SUT VIC AB 2-0 CT1 TAPERPNT 27 (SUTURE) ×1 IMPLANT
TOWEL OR 17X24 6PK STRL BLUE (TOWEL DISPOSABLE) ×3 IMPLANT
TOWEL OR 17X26 10 PK STRL BLUE (TOWEL DISPOSABLE) ×3 IMPLANT
TRAY CATH 16FR W/PLASTIC CATH (SET/KITS/TRAYS/PACK) IMPLANT
TRAY FOLEY W/METER SILVER 16FR (SET/KITS/TRAYS/PACK) IMPLANT
WATER STERILE IRR 1000ML POUR (IV SOLUTION) ×6 IMPLANT

## 2017-08-01 NOTE — Transfer of Care (Signed)
Immediate Anesthesia Transfer of Care Note  Patient: Melvin [name]  Procedure(s) Performed: LEFT TOTAL HIP REPLACEMENT ANTERIOR APPROACH (Left Hip)  Patient Location: PACU  Anesthesia Type:General  Level of Consciousness: awake, alert , oriented and patient cooperative  Airway & Oxygen Therapy: Patient Spontanous Breathing and Patient connected to nasal cannula oxygen  Post-op Assessment: Report given to RN, Post -op Vital signs reviewed and stable, Patient moving all extremities and Patient moving all extremities X 4  Post vital signs: Reviewed and stable  Last Vitals:  Vitals:   08/01/17 0449 08/01/17 0935  BP: 131/87 (!) 107/59  Pulse: 80 81  Resp: 17 15  Temp: 36.7 C   SpO2: 100% 100%    Last Pain:  Vitals:   08/01/17 0450  TempSrc:   PainSc: 2          Complications: No apparent anesthesia complications

## 2017-08-01 NOTE — Anesthesia Procedure Notes (Signed)
Procedure Name: Intubation Date/Time: 08/01/2017 7:31 AM Performed by: Izora Gala, CRNA Pre-anesthesia Checklist: Patient identified, Emergency Drugs available, Suction available and Patient being monitored Patient Re-evaluated:Patient Re-evaluated prior to induction Oxygen Delivery Method: Circle system utilized Preoxygenation: Pre-oxygenation with 100% oxygen Induction Type: IV induction Ventilation: Mask ventilation without difficulty Laryngoscope Size: Miller and 3 Grade View: Grade I Tube type: Oral Tube size: 7.5 mm Number of attempts: 1 Airway Equipment and Method: Patient positioned with wedge pillow and Stylet Placement Confirmation: ETT inserted through vocal cords under direct vision,  positive ETCO2 and breath sounds checked- equal and bilateral Secured at: 22 cm Tube secured with: Tape Dental Injury: Teeth and Oropharynx as per pre-operative assessment

## 2017-08-01 NOTE — Progress Notes (Signed)
PROGRESS NOTE    Melvin [name]  ERD:[id number] DOB: November 29, 1960 DOA: 07/31/2017 PCP: Charlott Rakes, MD  Brief Narrative:Melvin [name] is a 57 y.o. male with medical history significant for poorly-controlled diabetes, recent left distal ulnar osteomyelitis s/p 6 wks ceftriaxone that ended 2/12, pancreatic insufficiency and hx pancreatitis, neuropathy, who presents after a fall 2 days ago at home. Says suffered from hypoglycemia that day (sweating, palpitations, did not check blood sugar), became weak and fell, landing on left hip, Xray with fracture. -Underwent left total hip replacement 2/23  Assessment & Plan:   left closed femoral neck fracture -Orthopedics Dr. Ninfa Linden consulted, -Underwent left total hip replacement this morning 2/23  -Start DVT prophylaxis from tomorrow  -Continue IV fluids today  -Physical therapy evaluation   Type 2 diabetes mellitus -Continue home dose of Lantus, sliding scale insulin  Mild acute kidney injury -Resolved with hydration  Recent left distal osteomyelitis -Completed six-week course of IV ceftriaxone on 2/12 -Need to remove PICC line prior to discharge  Mild chronic thrombocytopenia -Likely secondary to alcohol use -Follow-up HCV, B-12  DVT prophylaxis:  start Lovenox tomorrow Code Status: full  Family Communication:  no family at bedside Disposition Plan:  to be determined   Consults called: orthopedis dr. Ninfa Linden     Procedures:   Antimicrobials:    Subjective: -Just back from OR this am, already walked with PT  Objective: Vitals:   08/01/17 1005 08/01/17 1020 08/01/17 1034 08/01/17 1035  BP: 112/75 113/72  106/76  Pulse: 78 77 80 79  Resp: 10 (!) 8 12 15   Temp:    (!) 97.3 F (36.3 C)  TempSrc:      SpO2: 100% 100% 100% 100%    Intake/Output Summary (Last 24 hours) at 08/01/2017 1313 Last data filed at 08/01/2017 1037 Gross per 24 hour  Intake 2277.08 ml  Output 400 ml  Net 1877.08 ml   There were  no vitals filed for this visit.  Examination:  General exam: Appears calm and comfortable  Respiratory system: Clear to auscultation. Respiratory effort normal. Cardiovascular system: S1 & S2 heard, RRR. No JVD, Gastrointestinal system: Abdomen is nondistended, soft and nontender.Normal bowel sounds heard. Central nervous system: Alert and oriented. No focal neurological deficits. Extremities: no edema, L hip with dressing Psychiatry: Judgement and insight appear normal. Mood & affect appropriate.     Data Reviewed:   CBC: Recent Labs  Lab 07/31/17 1855  WBC 5.3  NEUTROABS 3.1  HGB 10.9*  HCT 33.0*  MCV 95.7  PLT 563*   Basic Metabolic Panel: Recent Labs  Lab 07/31/17 1855 08/01/17 0349  NA 137 136  K 4.2 3.6  CL 112* 111  CO2 18* 20*  GLUCOSE 391* 109*  BUN 20 17  CREATININE 1.40* 1.11  CALCIUM 9.0 8.6*   GFR: Estimated Creatinine Clearance: 70.2 mL/min (by C-G formula based on SCr of 1.11 mg/dL). Liver Function Tests: No results for input(s): AST, ALT, ALKPHOS, BILITOT, PROT, ALBUMIN in the last 168 hours. No results for input(s): LIPASE, AMYLASE in the last 168 hours. No results for input(s): AMMONIA in the last 168 hours. Coagulation Profile: No results for input(s): INR, PROTIME in the last 168 hours. Cardiac Enzymes: No results for input(s): CKTOTAL, CKMB, CKMBINDEX, TROPONINI in the last 168 hours. BNP (last 3 results) No results for input(s): PROBNP in the last 8760 hours. HbA1C: No results for input(s): HGBA1C in the last 72 hours. CBG: Recent Labs  Lab 07/31/17 1905 07/31/17 2233 08/01/17 1497  08/01/17 0936 08/01/17 1255  GLUCAP 364* 227* 83 85 131*   Lipid Profile: No results for input(s): CHOL, HDL, LDLCALC, TRIG, CHOLHDL, LDLDIRECT in the last 72 hours. Thyroid Function Tests: No results for input(s): TSH, T4TOTAL, FREET4, T3FREE, THYROIDAB in the last 72 hours. Anemia Panel: Recent Labs    08/01/17 0349  VITAMINB12 123*   Urine  analysis:    Component Value Date/Time   COLORURINE AMBER (A) 06/10/2017 1055   APPEARANCEUR HAZY (A) 06/10/2017 1055   LABSPEC 1.020 06/10/2017 1055   PHURINE 5.0 06/10/2017 1055   GLUCOSEU 50 (A) 06/10/2017 1055   HGBUR MODERATE (A) 06/10/2017 1055   BILIRUBINUR NEGATIVE 06/10/2017 1055   BILIRUBINUR small 05/28/2015 1612   KETONESUR 5 (A) 06/10/2017 1055   PROTEINUR 100 (A) 06/10/2017 1055   UROBILINOGEN 1.0 05/28/2015 1612   UROBILINOGEN 1.0 10/24/2014 1215   NITRITE NEGATIVE 06/10/2017 1055   LEUKOCYTESUR NEGATIVE 06/10/2017 1055   Sepsis Labs: @LABRCNTIP (procalcitonin:4,lacticidven:4)  ) Recent Results (from the past 240 hour(s))  Surgical pcr screen     Status: None   Collection Time: 07/31/17  9:58 PM  Result Value Ref Range Status   MRSA, PCR NEGATIVE NEGATIVE Final   Staphylococcus aureus NEGATIVE NEGATIVE Final    Comment: (NOTE) The Xpert SA Assay (FDA approved for NASAL specimens in patients 60 years of age and older), is one component of a comprehensive surveillance program. It is not intended to diagnose infection nor to guide or monitor treatment. Performed at [facility], [place] [address], [place], [place] [address]          Radiology Studies: Dg Ankle Complete Left  Result Date: 07/31/2017 CLINICAL DATA:  Fall on Wednesday. Left hip fracture. Pain in left ankle. EXAM: LEFT ANKLE COMPLETE - 3+ VIEW COMPARISON:  Lower leg radiographs from 06/10/2017 FINDINGS: Mild spurring of the distal tibial rim. Small plantar calcaneal spur. Mild dorsal talonavicular spurring. No acute bony findings. IMPRESSION: 1. Mild hindfoot spurring. Small plantar calcaneal spur. No acute bony findings in the ankle. Electronically Signed   By: Van Clines M.D.   On: 07/31/2017 19:41   Dg Pelvis Portable  Result Date: 08/01/2017 CLINICAL DATA:  Left total hip replacement. EXAM: PORTABLE PELVIS 1-2 VIEWS COMPARISON:  Left hip x-rays from yesterday. FINDINGS: The left  hip demonstrates a total arthroplasty without evidence of hardware failure or complication. There is expected intra-articular air. There is no fracture or dislocation. The alignment is anatomic. Post-surgical changes noted in the surrounding soft tissues. IMPRESSION: Left total hip arthroplasty without evidence of acute postoperative complication. Electronically Signed   By: Titus Dubin M.D.   On: 08/01/2017 09:51   Dg Chest Port 1 View  Result Date: 07/31/2017 CLINICAL DATA:  57 year old male with right-sided PICC. EXAM: PORTABLE CHEST 1 VIEW COMPARISON:  Chest radiograph dated 06/22/2017 FINDINGS: A right-sided PICC is seen with tip over the upper SVC. There is shallow inspiration. Left lung base densities may represent atelectatic changes or infiltrate. There is no pleural effusion or pneumothorax mild cardiomegaly with mild prominence of the central vasculature. No acute osseous pathology. IMPRESSION: 1. Right-sided PICC with tip over upper SVC. 2. Mild cardiomegaly with mild vascular congestion. 3. Left lung base atelectatic changes. Infiltrate is not excluded. Clinical correlation is recommended. Electronically Signed   By: Anner Crete M.D.   On: 07/31/2017 23:18   Dg C-arm 1-60 Min  Result Date: 08/01/2017 CLINICAL DATA:  Post left total hip replacement. EXAM: DG C-ARM 61-120 MIN; OPERATIVE  LEFT HIP WITH PELVIS FLUOROSCOPY TIME:  57 seconds COMPARISON:  Left hip radiographs - 07/11/2017 FINDINGS: Five spot intraoperative fluoroscopic anterior projection images of the lower pelvis and left hip are provided for review Images demonstrate the sequela of left total hip replacement. Alignment appears anatomic given solitary AP projection. No definite fracture given solitary AP projection. There is expected subcutaneous emphysema about the operative site. No radiopaque foreign body. IMPRESSION: Post uncomplicated left total hip replacement. Electronically Signed   By: Sandi Mariscal M.D.   On:  08/01/2017 09:17   Dg Hip Operative Unilat W Or W/o Pelvis Left  Result Date: 08/01/2017 CLINICAL DATA:  Post left total hip replacement. EXAM: DG C-ARM 61-120 MIN; OPERATIVE LEFT HIP WITH PELVIS FLUOROSCOPY TIME:  57 seconds COMPARISON:  Left hip radiographs - 07/11/2017 FINDINGS: Five spot intraoperative fluoroscopic anterior projection images of the lower pelvis and left hip are provided for review Images demonstrate the sequela of left total hip replacement. Alignment appears anatomic given solitary AP projection. No definite fracture given solitary AP projection. There is expected subcutaneous emphysema about the operative site. No radiopaque foreign body. IMPRESSION: Post uncomplicated left total hip replacement. Electronically Signed   By: Sandi Mariscal M.D.   On: 08/01/2017 09:17   Dg Hip Unilat With Pelvis 2-3 Views Left  Result Date: 07/31/2017 CLINICAL DATA:  Left hip pain since a fall 3 days ago. Initial encounter. EXAM: DG HIP (WITH OR WITHOUT PELVIS) 2-3V LEFT COMPARISON:  None. FINDINGS: The patient has a left femoral neck fracture. The fracture appears to be subcapital. The femoral head is located. No other bony or joint abnormality is seen. IMPRESSION: Acute left femoral neck fracture appears to be subcapital. Electronically Signed   By: Inge Rise M.D.   On: 07/31/2017 15:28        Scheduled Meds: . aspirin  81 mg Oral BID  . docusate sodium  100 mg Oral BID  . famotidine  10 mg Oral TID AC  . gabapentin  600 mg Oral BID  . insulin aspart  0-15 Units Subcutaneous TID WC  . insulin aspart  0-5 Units Subcutaneous QHS  . methocarbamol       Continuous Infusions: . sodium chloride 125 mL/hr at 08/01/17 0047  . sodium chloride    . methocarbamol (ROBAXIN)  IV    . vancomycin       LOS: 1 day    Time spent: 64min    Domenic Polite, MD [facility] Page via [email or web address], password [email or web address] After 7PM please contact night-coverage  08/01/2017, 1:13 PM

## 2017-08-01 NOTE — Op Note (Deleted)
  The note originally documented on this encounter has been moved the the encounter in which it belongs.  

## 2017-08-01 NOTE — Anesthesia Postprocedure Evaluation (Signed)
Anesthesia Post Note  Patient: Melvin [name]  Procedure(s) Performed: LEFT TOTAL HIP REPLACEMENT ANTERIOR APPROACH (Left Hip)     Patient location during evaluation: PACU Anesthesia Type: General Level of consciousness: awake and alert Pain management: pain level controlled Vital Signs Assessment: post-procedure vital signs reviewed and stable Respiratory status: spontaneous breathing, nonlabored ventilation, respiratory function stable and patient connected to nasal cannula oxygen Cardiovascular status: blood pressure returned to baseline and stable Postop Assessment: no apparent nausea or vomiting Anesthetic complications: no    Last Vitals:  Vitals:   08/01/17 1035 08/01/17 1500  BP: 106/76 103/68  Pulse: 79 81  Resp: 15 16  Temp: (!) 36.3 C 36.5 C  SpO2: 100% 100%    Last Pain:  Vitals:   08/01/17 1932  TempSrc:   PainSc: 0-No pain                 Kayle Correa S

## 2017-08-01 NOTE — Brief Op Note (Signed)
08/01/2017  9:27 AM  PATIENT:  Melvin [name]  57 y.o. male  PRE-OPERATIVE DIAGNOSIS:  LEFT HIP FEMORAL FRACTURE  POST-OPERATIVE DIAGNOSIS:  LEFT HIP FEMORAL FRACTURE  PROCEDURE:  Procedure(s): LEFT TOTAL HIP REPLACEMENT ANTERIOR APPROACH (Left)  SURGEON:  Surgeon(s) and Role:    Mcarthur Rossetti, MD - Primary  PHYSICIAN ASSISTANT: Benita Stabile, PA-C  ANESTHESIA:   general  EBL:  400 mL   COUNTS:  YES  DICTATION: .Other Dictation: Dictation Number [id number]  PLAN OF CARE: Admit to inpatient   PATIENT DISPOSITION:  PACU - hemodynamically stable.   Delay start of Pharmacological VTE agent (>24hrs) due to surgical blood loss or risk of bleeding: no

## 2017-08-01 NOTE — Progress Notes (Signed)
Physical Therapy Evaluation Patient Details Name: Melvin [name] MRN: [id number] DOB: 07/02/1960 Today's Date: 08/01/2017   History of Present Illness  Melvin [name] is a 57 y/o male admitted on 07/31/17 after a fall within the home with resultant L hip fracture. L THA (anterior) on 08/01/17. Patient with a PMH significant for poorly controlled DM, recent L distal ulnar osteomyelitis, pancreatic insufficiency and hx of pancreatitis, neuropathy, CHF.  Clinical Impression  Pt admitted with above diagnosis. Pt currently with functional limitations due to the deficits listed below (see PT Problem List). PTA, patient was Mod I with ADLs and ambulation with SPC. Patient today with Min A for LE management for bed mobility and Min A for sit to/from stand transfers with VC provided for hand placement to maximize safety and efficiency with transfers. Gait training in hallway with reduced weight acceptance onto L LE, as expected. Attempted to sit in recliner, however BP at 99/58 after gait, therefore placed back in bed.  Pt will benefit from skilled PT to increase their independence and safety with mobility to allow discharge to the venue listed below.       Follow Up Recommendations Home health PT;Supervision for mobility/OOB    Equipment Recommendations  Rolling walker with 5" wheels    Recommendations for Other Services       Precautions / Restrictions Precautions Precautions: Fall Restrictions Weight Bearing Restrictions: Yes LLE Weight Bearing: Weight bearing as tolerated      Mobility  Bed Mobility Overal bed mobility: Needs Assistance Bed Mobility: Supine to Sit;Sit to Supine     Supine to sit: Min assist Sit to supine: Min assist   General bed mobility comments: Min A for LE management  Transfers Overall transfer level: Needs assistance Equipment used: Rolling walker (2 wheeled) Transfers: Sit to/from Stand Sit to Stand: Min assist         General transfer comment: VC  for hand placement  Ambulation/Gait Ambulation/Gait assistance: Min guard Ambulation Distance (Feet): 120 Feet Assistive device: Rolling walker (2 wheeled) Gait Pattern/deviations: Step-to pattern;Decreased step length - right;Decreased stance time - left;Decreased weight shift to left;Antalgic   Gait velocity interpretation: Below normal speed for age/gender    Stairs            Wheelchair Mobility    Modified Rankin (Stroke Patients Only)       Balance Overall balance assessment: Needs assistance Sitting-balance support: Feet supported Sitting balance-[name]: Good     Standing balance support: Bilateral upper extremity supported;During functional activity Standing balance-[name]: Fair                               Pertinent Vitals/Pain Pain Assessment: 0-10 Pain Score: 6  Pain Location: L hip Pain Descriptors / Indicators: Guarding;Operative site guarding Pain Intervention(s): Limited activity within patient's tolerance;Monitored during session;Repositioned    Home Living Family/patient expects to be discharged to:: Private residence Living Arrangements: Children   Type of Home: House Home Access: Stairs to enter Entrance Stairs-Rails: None Technical brewer of Steps: 2 Home Layout: One level Home Equipment: Marine scientist - single point      Prior Function Level of Independence: Independent with assistive device(s)         Comments: ambulation with SPC     Hand Dominance        Extremity/Trunk Assessment   Upper Extremity Assessment Upper Extremity Assessment: Overall WFL for tasks assessed    Lower Extremity Assessment  Lower Extremity Assessment: Generalized weakness       Communication   Communication: No difficulties  Cognition Arousal/Alertness: Awake/alert Behavior During Therapy: WFL for tasks assessed/performed Overall Cognitive Status: Within Functional Limits for tasks assessed                                         General Comments      Exercises     Assessment/Plan    PT Assessment Patient needs continued PT services  PT Problem List Decreased strength;Decreased activity tolerance;Decreased balance;Decreased mobility;Decreased knowledge of use of DME;Decreased safety awareness;Pain       PT Treatment Interventions DME instruction;Gait training;Stair training;Functional mobility training;Therapeutic activities;Therapeutic exercise;Balance training;Patient/family education    PT Goals (Current goals can be found in the Care Plan section)  Acute Rehab PT Goals Patient Stated Goal: return home, get back on his feet PT Goal Formulation: With patient/family Time For Goal Achievement: 08/08/17 Potential to Achieve Goals: Good    Frequency 7X/week   Barriers to discharge        Co-evaluation               AM-PAC PT "6 Clicks" Daily Activity  Outcome Measure Difficulty turning over in bed (including adjusting bedclothes, sheets and blankets)?: Unable Difficulty moving from lying on back to sitting on the side of the bed? : Unable Difficulty sitting down on and standing up from a chair with arms (e.g., wheelchair, bedside commode, etc,.)?: Unable Help needed moving to and from a bed to chair (including a wheelchair)?: A Little Help needed walking in hospital room?: A Little Help needed climbing 3-5 steps with a railing? : A Lot 6 Click Score: 11    End of Session Equipment Utilized During Treatment: Gait belt Activity Tolerance: Patient tolerated treatment well Patient left: in bed;with call bell/phone within reach;with family/visitor present Nurse Communication: Mobility status PT Visit Diagnosis: Unsteadiness on feet (R26.81);Other abnormalities of gait and mobility (R26.89);Muscle weakness (generalized) (M62.81);History of falling (Z91.81);Difficulty in walking, not elsewhere classified (R26.2)    Time: 1962-2297 PT Time Calculation  (min) (ACUTE ONLY): 34 min   Charges:   PT Evaluation $PT Eval Moderate Complexity: 1 Mod PT Treatments $Gait Training: 8-22 mins   PT G Codes:        Lanney Gins, PT, DPT 08/01/17 1:42 PM

## 2017-08-01 NOTE — Progress Notes (Signed)
Patient ID: Melvin [name], male   DOB: 08/22/1960, 57 y.o.   MRN: [id number] The patient understands fully that we are proceeding to surgery this morning to address his left hip fracture.  This will be with a left total hip fracture.  He understands this.  Risks and benefits were discussed and informed consent is obtained.

## 2017-08-01 NOTE — Op Note (Signed)
NAME:  Melvin [name], Melvin [name]            ACCOUNT NO.:  [id number]  MEDICAL RECORD NO.:  [id number]  LOCATION:                                 FACILITY:  PHYSICIAN:  Lind Guest. Ninfa Linden, M.D.DATE OF BIRTH:  Nov 16, 1960  DATE OF PROCEDURE:  08/01/2017 DATE OF DISCHARGE:                              OPERATIVE REPORT   PREOPERATIVE DIAGNOSIS:  Displaced left hip femoral neck fracture.  POSTOPERATIVE DIAGNOSIS:  Displaced left hip femoral neck fracture.  PROCEDURE:  Left total hip arthroplasty through direct anterior approach.  IMPLANTS:  DePuy Sector Gription acetabular component size 54 with a single screw, size 36/+0 polyethylene liner, size 11 Corail femoral component with standard offset, size 36/+5 metal hip ball.  SURGEON:  Lind Guest. Ninfa Linden, MD.  ASSISTANT:  Erskine Emery, PA-C.  ANESTHESIA:  General.  ANTIBIOTICS:  IV vancomycin.  BLOOD LOSS:  400-500 mL.  COMPLICATIONS:  None.  INDICATIONS:  Melvin [name] is a 57 year old, poorly controlled diabetic, who sustained a mechanical fall a few days ago injuring his left hip. He fell and presented to the emergency room last night with inability to ambulate and severe left hip pain.  He was found to have a displaced left hip femoral neck fracture.  He is only 57 years old and with this type of injury, we recommended total hip arthroplasty.  He is unfortunate a poorly controlled diabetic and has been dealing with infection in his wrist and has had leg wounds before.  He is neuropathic.  He is set up for significant infection.  The risks and benefits of surgery were explained to him in detail and he did wish to proceed.  PROCEDURE DESCRIPTION:  After informed consent was obtained, appropriate left hip was marked.  He was brought to the operating room where general anesthesia was obtained while he was on a stretcher.  Traction boots were placed on both his feet.  Next, he was placed supine on the Hana fracture table,  the perineal post in place and both legs in in-inline skeletal traction devices, but no traction applied.  His left operative hip was prepped and draped with DuraPrep and sterile drapes.  A time-out was called.  He was identified as correct patient and correct left hip. We then made an incision just inferior and posterior to the anterior superior iliac spine and carried this obliquely down the leg.  We dissected down tensor fascia lata muscle and tensor fascia was then divided longitudinally to proceed with a direct anterior approach to the hip.  We identified and cauterized the circumflex vessels and identified the hip capsule.  I opened up the hip capsule in an L-type format, finding a large joint effusion and large hematoma from his femoral neck fracture and definitely femoral neck fracture.  We made a femoral neck cut just proximal to the lesser trochanter and distal to the fracture itself and then removed the femoral head in its entirety.  We then placed a bent Hohmann over the medial acetabular rim and removed all remnants of the acetabular labrum and other debris and began reaming under direct visualization and direct fluoroscopy from a size 48 reamer going up to a size 52 with all reamers under direct  visualization and the last reamer under direct fluoroscopy, so we could obtain our depth of reaming, our inclination and anteversion.  I then placed a real DePuy Sector Gription acetabular component size 52, but could not get it to bite well.  I then went up to a 54 acetabular component and it did bite well and seated well under direct fluoroscopy, but also placed a single screw.  Attention was then turned to the femur with the leg externally rotated to 120 degrees, extended and adducted.  We were able to place a Mueller retractor medially and a Hohmann retractor behind the greater trochanter.  We released the lateral joint capsule and used a box cutting osteotome to enter the femoral  canal and a rongeur to lateralize.  I then began broaching using a size 8 broach going up to a size 11.  With the size 11 in place, we trialed a standard offset femoral neck and a 36/+1.5 hip ball.  We brought the leg back over and up with traction and internal rotation, reduced the pelvis.  We felt like we needed just a little more leg length and offset.  We then dislocated the hip and removed the trial components.  We were able to place the real Corail femoral component with standard offset, size 11 and the real 36/+5 metal hip ball and reduced this in the acetabulum and again it was stable.  We put it through range of motion and we were pleased with stability.  We then irrigated the soft tissue with normal saline solution using pulsatile lavage.  We closed the joint capsule with interrupted #1 Ethibond suture followed by running #1 Vicryl on tensor fascia, 0 Vicryl on the deep tissue, 2-0 Vicryl in the subcutaneous tissue, interrupted staples on the skin.  Xeroform and Aquacel dressing were applied.  He was awakened, taken off the Hana table, awakened, extubated, taken to the recovery room in stable condition.  All final counts were correct.  There were no complications noted.  Of note, Erskine Emery, PAC, assisted the entire case.  His assistance was crucial for facilitating all aspects of this case.     Lind Guest. Ninfa Linden, M.D.     CYB/MEDQ  D:  08/01/2017  T:  08/01/2017  Job:  [id number]

## 2017-08-01 NOTE — Anesthesia Preprocedure Evaluation (Signed)
Anesthesia Evaluation  Patient identified by MRN, date of birth, ID band Patient awake    Reviewed: Allergy & Precautions, H&P , NPO status , Patient's Chart, lab work & pertinent test results  Airway Mallampati: II   Neck ROM: full    Dental   Pulmonary neg pulmonary ROS,    breath sounds clear to auscultation       Cardiovascular hypertension, +CHF   Rhythm:regular Rate:Normal     Neuro/Psych neuropathy  Neuromuscular disease    GI/Hepatic GERD  ,(+)     substance abuse  alcohol use and cocaine use,   Endo/Other  diabetes, Type 2, Insulin Dependent  Renal/GU      Musculoskeletal  (+) Arthritis ,   Abdominal   Peds  Hematology  (+) anemia ,   Anesthesia Other Findings   Reproductive/Obstetrics                             Anesthesia Physical Anesthesia Plan  ASA: III  Anesthesia Plan: General   Post-op Pain Management:    Induction: Intravenous  PONV Risk Score and Plan: 2 and Ondansetron, Midazolam and Treatment may vary due to age or medical condition  Airway Management Planned: Oral ETT  Additional Equipment:   Intra-op Plan:   Post-operative Plan: Extubation in OR  Informed Consent: I have reviewed the patients History and Physical, chart, labs and discussed the procedure including the risks, benefits and alternatives for the proposed anesthesia with the patient or authorized representative who has indicated his/her understanding and acceptance.     Plan Discussed with: CRNA, Anesthesiologist and Surgeon  Anesthesia Plan Comments:         Anesthesia Quick Evaluation

## 2017-08-02 DIAGNOSIS — Z452 Encounter for adjustment and management of vascular access device: Secondary | ICD-10-CM

## 2017-08-02 DIAGNOSIS — K8689 Other specified diseases of pancreas: Secondary | ICD-10-CM

## 2017-08-02 LAB — GLUCOSE, CAPILLARY
GLUCOSE-CAPILLARY: 353 mg/dL — AB (ref 65–99)
Glucose-Capillary: 108 mg/dL — ABNORMAL HIGH (ref 65–99)
Glucose-Capillary: 188 mg/dL — ABNORMAL HIGH (ref 65–99)
Glucose-Capillary: 210 mg/dL — ABNORMAL HIGH (ref 65–99)

## 2017-08-02 LAB — BASIC METABOLIC PANEL
Anion gap: 6 (ref 5–15)
BUN: 19 mg/dL (ref 6–20)
CALCIUM: 8.5 mg/dL — AB (ref 8.9–10.3)
CO2: 20 mmol/L — ABNORMAL LOW (ref 22–32)
CREATININE: 1.52 mg/dL — AB (ref 0.61–1.24)
Chloride: 108 mmol/L (ref 101–111)
GFR, EST AFRICAN AMERICAN: 57 mL/min — AB (ref >60)
GFR, EST NON AFRICAN AMERICAN: 49 mL/min — AB (ref >60)
Glucose, Bld: 296 mg/dL — ABNORMAL HIGH (ref 65–99)
Potassium: 4.5 mmol/L (ref 3.5–5.1)
SODIUM: 134 mmol/L — AB (ref 135–145)

## 2017-08-02 LAB — CBC
HCT: 23.9 % — ABNORMAL LOW (ref 39.0–52.0)
HEMOGLOBIN: 8 g/dL — AB (ref 13.0–17.0)
MCH: 31.5 pg (ref 26.0–34.0)
MCHC: 33.5 g/dL (ref 30.0–36.0)
MCV: 94.1 fL (ref 78.0–100.0)
PLATELETS: 123 10*3/uL — AB (ref 150–400)
RBC: 2.54 MIL/uL — ABNORMAL LOW (ref 4.22–5.81)
RDW: 14.3 % (ref 11.5–15.5)
WBC: 5.8 10*3/uL (ref 4.0–10.5)

## 2017-08-02 LAB — HCV INTERPRETATION

## 2017-08-02 LAB — HCV AB W REFLEX TO QUANT PCR: HCV Ab: <0.1 s/co ratio (ref 0.0–0.9)

## 2017-08-02 MED ORDER — CYANOCOBALAMIN 1000 MCG/ML IJ SOLN
1000.0000 ug | Freq: Once | INTRAMUSCULAR | Status: AC
  Administered 2017-08-02: 1000 ug via INTRAMUSCULAR
  Filled 2017-08-02 (×2): qty 1

## 2017-08-02 NOTE — Progress Notes (Signed)
   Subjective:  Patient reports pain as mild.  Just some soreness.  Objective:   VITALS:   Vitals:   08/01/17 1500 08/01/17 2030 08/02/17 0012 08/02/17 0610  BP: 103/68 113/60 135/89 (!) 141/90  Pulse: 81 96 94 88  Resp: 16 18 19 18   Temp: 97.7 F (36.5 C) 97.9 F (36.6 C) 98.1 F (36.7 C) 97.9 F (36.6 C)  TempSrc: Oral Oral Oral Oral  SpO2: 100% 100% 100% 100%    Neurologically intact Neurovascular intact Sensation intact distally Intact pulses distally Dorsiflexion/Plantar flexion intact Incision: dressing C/D/I and no drainage No cellulitis present Compartment soft   Lab Results  Component Value Date   WBC 5.8 08/02/2017   HGB 8.0 (L) 08/02/2017   HCT 23.9 (L) 08/02/2017   MCV 94.1 08/02/2017   PLT 123 (L) 08/02/2017     Assessment/Plan:  1 Day Post-Op   - Expected postop acute blood loss anemia - will monitor for symptoms - Up with PT/OT - DVT ppx - SCDs, ambulation, lovenox - WBAT operative extremity - Pain controlled - Discharge planning  Johan Creveling 08/02/2017, 9:43 AM [phone]

## 2017-08-02 NOTE — Evaluation (Signed)
Occupational Therapy Evaluation Patient Details Name: Melvin [name] MRN: [id number] DOB: [birth year] Today's Date: 08/02/2017    History of Present Illness Mr. Okimoto is a 57 y/o male admitted on 07/31/17 after a fall within the home with resultant L hip fracture. L THA (anterior) on 08/01/17. Patient with a PMH significant for poorly controlled DM, recent L distal ulnar osteomyelitis, pancreatic insufficiency and hx of pancreatitis, neuropathy, CHF.   Clinical Impression   PTA Pt mod I with SPC and shower seat. Pt is currently min A for functional transfers, set up for UB ADL and mod A for LB ADL. Pt able to demonstrate toilet transfer with 3 in 1 over commode at min guard assist level. Pt requires continued skilled OT in the acute setting to maximize safety and independence in ADL with focus on AE education and tub transfer (AP) hopefully with children present for caregiver education as well. Please provide tub transfer handout and bring AE kit to next session.    Follow Up Recommendations  Supervision - Intermittent    Equipment Recommendations  3 in 1 bedside commode    Recommendations for Other Services       Precautions / Restrictions Precautions Precautions: Fall Restrictions Weight Bearing Restrictions: Yes LLE Weight Bearing: Weight bearing as tolerated      Mobility Bed Mobility Overal bed mobility: Needs Assistance Bed Mobility: Supine to Sit;Sit to Supine     Supine to sit: Min assist Sit to supine: Min assist   General bed mobility comments: Min A for guiding LE on/off bed - good effort to perform by patient  Transfers Overall transfer level: Needs assistance Equipment used: Rolling walker (2 wheeled) Transfers: Sit to/from Stand Sit to Stand: Min guard         General transfer comment: VC for hand placement and for equal weight shift once in standing position    Balance Overall balance assessment: Needs assistance Sitting-balance support: Feet  supported Sitting balance-[name]: Good     Standing balance support: Bilateral upper extremity supported;During functional activity Standing balance-[name]: Fair                             ADL either performed or assessed with clinical judgement   ADL Overall ADL's : Needs assistance/impaired Eating/Feeding: Modified independent   Grooming: Set up;Wash/dry face;Wash/dry hands;Oral care;Sitting Grooming Details (indicate cue type and reason): in recliner - talked about sitting for grooming tasks in the home environment Upper Body Bathing: Minimal assistance;Sitting   Lower Body Bathing: Moderate assistance;Sitting/lateral leans   Upper Body Dressing : Set up;Sitting   Lower Body Dressing: Sit to/from stand;Maximal assistance(with RW for balance) Lower Body Dressing Details (indicate cue type and reason): total A for socks Toilet Transfer: Minimal assistance;Ambulation;RW Toilet Transfer Details (indicate cue type and reason): min A for initial boost, BSC over toilet for higher height and support with transition Toileting- Clothing Manipulation and Hygiene: Min guard;Sit to/from stand Toileting - Clothing Manipulation Details (indicate cue type and reason): (not rear peri care) Tub/ Shower Transfer: Moderate assistance;Shower Technical sales engineer Details (indicate cue type and reason): not practiced with Pt Functional mobility during ADLs: Minimal assistance;Rolling walker;Min guard(min guard assist) General ADL Comments: will benefit from demo/education from AE kit as option for LB ADL     Vision Patient Visual Report: No change from baseline       Perception     Praxis      Pertinent  Vitals/Pain Pain Assessment: Faces Faces Pain Scale: Hurts little more Pain Location: L hip Pain Descriptors / Indicators: Grimacing;Guarding Pain Intervention(s): Monitored during session;Repositioned     Hand Dominance     Extremity/Trunk  Assessment Upper Extremity Assessment Upper Extremity Assessment: Overall WFL for tasks assessed   Lower Extremity Assessment Lower Extremity Assessment: LLE deficits/detail LLE Deficits / Details: anticipated pain and decreased ROM post-op LLE Coordination: decreased gross motor       Communication Communication Communication: No difficulties   Cognition Arousal/Alertness: Awake/alert Behavior During Therapy: WFL for tasks assessed/performed Overall Cognitive Status: Within Functional Limits for tasks assessed                                     General Comments       Exercises     Shoulder Instructions      Home Living Family/patient expects to be discharged to:: Private residence Living Arrangements: Children Available Help at Discharge: Family Type of Home: House Home Access: Stairs to enter Technical brewer of Steps: 2 Entrance Stairs-Rails: None Home Layout: One level     Bathroom Shower/Tub: Teacher, early years/pre: Standard Bathroom Accessibility: Yes How Accessible: Accessible via walker Home Equipment: Shower seat;Cane - single point          Prior Functioning/Environment Level of Independence: Independent with assistive device(s)        Comments: ambulation with SPC; uses shower chair for bathing        OT Problem List: Decreased range of motion;Decreased activity tolerance;Impaired balance (sitting and/or standing);Decreased safety awareness;Decreased knowledge of use of DME or AE;Decreased knowledge of precautions;Pain      OT Treatment/Interventions:      OT Goals(Current goals can be found in the care plan section) Acute Rehab OT Goals Patient Stated Goal: return home, get back on his feet OT Goal Formulation: With patient Time For Goal Achievement: 08/16/17 Potential to Achieve Goals: Good ADL Goals Pt Will Perform Grooming: with supervision;standing Pt Will Perform Lower Body Bathing: with adaptive  equipment;sit to/from stand;with supervision Pt Will Perform Lower Body Dressing: with supervision;with adaptive equipment;sit to/from stand Pt Will Perform Tub/Shower Transfer: with min assist;anterior/posterior transfer;shower seat;rolling walker  OT Frequency: Min 2X/week   Barriers to D/C:            Co-evaluation              AM-PAC PT "6 Clicks" Daily Activity     Outcome Measure Help from another person eating meals?: None Help from another person taking care of personal grooming?: A Little Help from another person toileting, which includes using toliet, bedpan, or urinal?: A Little Help from another person bathing (including washing, rinsing, drying)?: A Lot Help from another person to put on and taking off regular upper body clothing?: A Little Help from another person to put on and taking off regular lower body clothing?: A Lot 6 Click Score: 17   End of Session Equipment Utilized During Treatment: Gait belt;Rolling walker Nurse Communication: Mobility status;Weight bearing status  Activity Tolerance: Patient tolerated treatment well Patient left: in bed;with call bell/phone within reach;with SCD's reapplied  OT Visit Diagnosis: Unsteadiness on feet (R26.81);Other abnormalities of gait and mobility (R26.89);History of falling (Z91.81);Pain Pain - Right/Left: Left Pain - part of body: Hip                Time: 8338-2505 OT Time Calculation (min): 23  min Charges:  OT General Charges $OT Visit: 1 Visit OT Evaluation $OT Eval Moderate Complexity: 1 Mod OT Treatments $Self Care/Home Management : 8-22 mins G-Codes:     Hulda Humphrey OTR/L [place] 08/02/2017, 4:56 PM

## 2017-08-02 NOTE — Progress Notes (Signed)
Physical Therapy Treatment Patient Details Name: RADIN RAPTIS MRN: [id number] DOB: 08-Oct-1960 Today's Date: 08/02/2017    History of Present Illness Mr. Mineo is a 57 y/o male admitted on 07/31/17 after a fall within the home with resultant L hip fracture. L THA (anterior) on 08/01/17. Patient with a PMH significant for poorly controlled DM, recent L distal ulnar osteomyelitis, pancreatic insufficiency and hx of pancreatitis, neuropathy, CHF.    PT Comments    Patient motivated to work with PT this morning. Session focusing on improving independence with functional mobility. Patient requiring Min A for LE management to/from bed, even with great effort from patient. Gait improved today with more even step through pattern, however requiring standing rest breaks due to fatigue. Will continue to follow acutely to maximize functional mobility prior to d/c.     Follow Up Recommendations  Home health PT;Supervision for mobility/OOB     Equipment Recommendations  Rolling walker with 5" wheels    Recommendations for Other Services       Precautions / Restrictions Precautions Precautions: Fall Restrictions Weight Bearing Restrictions: Yes LLE Weight Bearing: Weight bearing as tolerated    Mobility  Bed Mobility Overal bed mobility: Needs Assistance Bed Mobility: Supine to Sit;Sit to Supine     Supine to sit: Min assist Sit to supine: Min assist   General bed mobility comments: Min A for guiding LE on/off bed - good effort to perform by patient  Transfers Overall transfer level: Needs assistance Equipment used: Rolling walker (2 wheeled) Transfers: Sit to/from Stand Sit to Stand: Min guard         General transfer comment: VC for hand placement and for equal weight shift once in standing position  Ambulation/Gait Ambulation/Gait assistance: Min guard Ambulation Distance (Feet): 150 Feet Assistive device: Rolling walker (2 wheeled) Gait Pattern/deviations:  Step-through pattern;Decreased stride length;Decreased weight shift to left;Antalgic Gait velocity: decreased   General Gait Details: improved gait today, but does require VC to remain close to AD for safety   Stairs            Wheelchair Mobility    Modified Rankin (Stroke Patients Only)       Balance Overall balance assessment: Needs assistance Sitting-balance support: Feet supported Sitting balance-[name]: Good     Standing balance support: Bilateral upper extremity supported;During functional activity Standing balance-[name]: Fair                              Cognition Arousal/Alertness: Awake/alert Behavior During Therapy: WFL for tasks assessed/performed Overall Cognitive Status: Within Functional Limits for tasks assessed                                        Exercises      General Comments        Pertinent Vitals/Pain Pain Assessment: Faces Faces Pain Scale: Hurts a little bit Pain Location: L hip Pain Descriptors / Indicators: Grimacing;Guarding Pain Intervention(s): Limited activity within patient's tolerance;Monitored during session;Repositioned;Ice applied    Home Living                      Prior Function            PT Goals (current goals can now be found in the care plan section) Acute Rehab PT Goals Patient Stated Goal: return home, get  back on his feet PT Goal Formulation: With patient/family Time For Goal Achievement: 08/08/17 Potential to Achieve Goals: Good Progress towards PT goals: Progressing toward goals    Frequency    7X/week      PT Plan Current plan remains appropriate    Co-evaluation              AM-PAC PT "6 Clicks" Daily Activity  Outcome Measure  Difficulty turning over in bed (including adjusting bedclothes, sheets and blankets)?: A Lot Difficulty moving from lying on back to sitting on the side of the bed? : Unable Difficulty sitting down on and  standing up from a chair with arms (e.g., wheelchair, bedside commode, etc,.)?: Unable Help needed moving to and from a bed to chair (including a wheelchair)?: A Little Help needed walking in hospital room?: A Little Help needed climbing 3-5 steps with a railing? : A Lot 6 Click Score: 12    End of Session Equipment Utilized During Treatment: Gait belt Activity Tolerance: Patient tolerated treatment well Patient left: in bed;with call bell/phone within reach;with family/visitor present Nurse Communication: Mobility status PT Visit Diagnosis: Unsteadiness on feet (R26.81);Other abnormalities of gait and mobility (R26.89);Muscle weakness (generalized) (M62.81);History of falling (Z91.81);Difficulty in walking, not elsewhere classified (R26.2)     Time: 6712-4580 PT Time Calculation (min) (ACUTE ONLY): 28 min  Charges:  $Gait Training: 8-22 mins $Therapeutic Activity: 8-22 mins                    G Codes:        Lanney Gins, PT, DPT 08/02/17 10:27 AM

## 2017-08-02 NOTE — Progress Notes (Signed)
PROGRESS NOTE    Melvin [name]  OAC:[id number] DOB: 1960/09/26 DOA: 07/31/2017 PCP: Charlott Rakes, MD  Brief Narrative:Melvin [name] is a 57 y.o. male with medical history significant for poorly-controlled diabetes, recent left distal ulnar osteomyelitis s/p 6 wks ceftriaxone that ended 2/12, pancreatic insufficiency and hx pancreatitis, neuropathy, who presents after a fall 2 days ago at home. Says suffered from hypoglycemia that day (sweating, palpitations, did not check blood sugar), became weak and fell, landing on left hip, Xray with fracture. -Underwent left total hip replacement 2/23  Assessment & Plan:   left closed femoral neck fracture -Orthopedics Dr. Ninfa Linden consulted, -Underwent left total hip replacement this morning 2/23  -Start Lovenox for DVT prophylaxis, start IV fluids today -Physical therapy consultation -Home with home health services tomorrow  Acute blood loss anemia -Secondary to postoperative blood loss and hemodilution -Stop fluids, check CBC in a.m.   Type 2 diabetes mellitus -Continue home dose of Lantus, sliding scale insulin  Chronic kidney disease disease stage II -Stable, monitor, stop IV fluids  Recent left distal osteomyelitis -Completed six-week course of IV ceftriaxone on 2/12 -Need to remove PICC line prior to discharge  Mild chronic thrombocytopenia -Likely secondary to alcohol use -B12 is low, start replacement  DVT prophylaxis: Lovenox  Code Status: full  Family Communication:  no family at bedside Disposition Plan: home with home health services tomorrow   Consults called: orthopedis dr. Ninfa Linden     Procedures: Left total hip hemiarthroplasty yesterday 2/23  Antimicrobials:    Subjective: -Feels okay, just ambulated with PT, no complaints  Objective: Vitals:   08/01/17 1500 08/01/17 2030 08/02/17 0012 08/02/17 0610  BP: 103/68 113/60 135/89 (!) 141/90  Pulse: 81 96 94 88  Resp: 16 18 19 18   Temp: 97.7  F (36.5 C) 97.9 F (36.6 C) 98.1 F (36.7 C) 97.9 F (36.6 C)  TempSrc: Oral Oral Oral Oral  SpO2: 100% 100% 100% 100%    Intake/Output Summary (Last 24 hours) at 08/02/2017 1225 Last data filed at 08/01/2017 1728 Gross per 24 hour  Intake 200 ml  Output 375 ml  Net -175 ml   There were no vitals filed for this visit.  Examination:  Gen: Awake, Alert, Oriented X 3, no distress HEENT: PERRLA, Neck supple, no JVD Lungs: Good air movement bilaterally, CTAB CVS: RRR,No Gallops,Rubs or new Murmurs Abd: soft, Non tender, non distended, BS present Extremities: No edema, left hip with dressing, incision is unremarkable no surrounding erythema or hematoma noted Skin: no new rashes Psychiatry: Judgement and insight appear normal. Mood & affect appropriate.     Data Reviewed:   CBC: Recent Labs  Lab 07/31/17 1855 08/02/17 0320  WBC 5.3 5.8  NEUTROABS 3.1  --   HGB 10.9* 8.0*  HCT 33.0* 23.9*  MCV 95.7 94.1  PLT 145* 010*   Basic Metabolic Panel: Recent Labs  Lab 07/31/17 1855 08/01/17 0349 08/02/17 0320  NA 137 136 134*  K 4.2 3.6 4.5  CL 112* 111 108  CO2 18* 20* 20*  GLUCOSE 391* 109* 296*  BUN 20 17 19   CREATININE 1.40* 1.11 1.52*  CALCIUM 9.0 8.6* 8.5*   GFR: Estimated Creatinine Clearance: 51.3 mL/min (A) (by C-G formula based on SCr of 1.52 mg/dL (H)). Liver Function Tests: No results for input(s): AST, ALT, ALKPHOS, BILITOT, PROT, ALBUMIN in the last 168 hours. No results for input(s): LIPASE, AMYLASE in the last 168 hours. No results for input(s): AMMONIA in the last 168 hours. Coagulation  Profile: No results for input(s): INR, PROTIME in the last 168 hours. Cardiac Enzymes: No results for input(s): CKTOTAL, CKMB, CKMBINDEX, TROPONINI in the last 168 hours. BNP (last 3 results) No results for input(s): PROBNP in the last 8760 hours. HbA1C: No results for input(s): HGBA1C in the last 72 hours. CBG: Recent Labs  Lab 08/01/17 1255 08/01/17 1645  08/01/17 2039 08/02/17 0818 08/02/17 1143  GLUCAP 131* 307* 260* 353* 108*   Lipid Profile: No results for input(s): CHOL, HDL, LDLCALC, TRIG, CHOLHDL, LDLDIRECT in the last 72 hours. Thyroid Function Tests: No results for input(s): TSH, T4TOTAL, FREET4, T3FREE, THYROIDAB in the last 72 hours. Anemia Panel: Recent Labs    08/01/17 0349  VITAMINB12 123*   Urine analysis:    Component Value Date/Time   COLORURINE AMBER (A) 06/10/2017 1055   APPEARANCEUR HAZY (A) 06/10/2017 1055   LABSPEC 1.020 06/10/2017 1055   PHURINE 5.0 06/10/2017 1055   GLUCOSEU 50 (A) 06/10/2017 1055   HGBUR MODERATE (A) 06/10/2017 1055   BILIRUBINUR NEGATIVE 06/10/2017 1055   BILIRUBINUR small 05/28/2015 1612   KETONESUR 5 (A) 06/10/2017 1055   PROTEINUR 100 (A) 06/10/2017 1055   UROBILINOGEN 1.0 05/28/2015 1612   UROBILINOGEN 1.0 10/24/2014 1215   NITRITE NEGATIVE 06/10/2017 1055   LEUKOCYTESUR NEGATIVE 06/10/2017 1055   Sepsis Labs: @LABRCNTIP (procalcitonin:4,lacticidven:4)  ) Recent Results (from the past 240 hour(s))  Surgical pcr screen     Status: None   Collection Time: 07/31/17  9:58 PM  Result Value Ref Range Status   MRSA, PCR NEGATIVE NEGATIVE Final   Staphylococcus aureus NEGATIVE NEGATIVE Final    Comment: (NOTE) The Xpert SA Assay (FDA approved for NASAL specimens in patients 46 years of age and older), is one component of a comprehensive surveillance program. It is not intended to diagnose infection nor to guide or monitor treatment. Performed at [facility], [place] [address], [place], [place] [address]          Radiology Studies: Dg Ankle Complete Left  Result Date: 07/31/2017 CLINICAL DATA:  Fall on Wednesday. Left hip fracture. Pain in left ankle. EXAM: LEFT ANKLE COMPLETE - 3+ VIEW COMPARISON:  Lower leg radiographs from 06/10/2017 FINDINGS: Mild spurring of the distal tibial rim. Small plantar calcaneal spur. Mild dorsal talonavicular spurring. No acute bony  findings. IMPRESSION: 1. Mild hindfoot spurring. Small plantar calcaneal spur. No acute bony findings in the ankle. Electronically Signed   By: Van Clines M.D.   On: 07/31/2017 19:41   Dg Pelvis Portable  Result Date: 08/01/2017 CLINICAL DATA:  Left total hip replacement. EXAM: PORTABLE PELVIS 1-2 VIEWS COMPARISON:  Left hip x-rays from yesterday. FINDINGS: The left hip demonstrates a total arthroplasty without evidence of hardware failure or complication. There is expected intra-articular air. There is no fracture or dislocation. The alignment is anatomic. Post-surgical changes noted in the surrounding soft tissues. IMPRESSION: Left total hip arthroplasty without evidence of acute postoperative complication. Electronically Signed   By: Titus Dubin M.D.   On: 08/01/2017 09:51   Dg Chest Port 1 View  Result Date: 07/31/2017 CLINICAL DATA:  57 year old male with right-sided PICC. EXAM: PORTABLE CHEST 1 VIEW COMPARISON:  Chest radiograph dated 06/22/2017 FINDINGS: A right-sided PICC is seen with tip over the upper SVC. There is shallow inspiration. Left lung base densities may represent atelectatic changes or infiltrate. There is no pleural effusion or pneumothorax mild cardiomegaly with mild prominence of the central vasculature. No acute osseous pathology. IMPRESSION: 1. Right-sided PICC with  tip over upper SVC. 2. Mild cardiomegaly with mild vascular congestion. 3. Left lung base atelectatic changes. Infiltrate is not excluded. Clinical correlation is recommended. Electronically Signed   By: Anner Crete M.D.   On: 07/31/2017 23:18   Dg C-arm 1-60 Min  Result Date: 08/01/2017 CLINICAL DATA:  Post left total hip replacement. EXAM: DG C-ARM 61-120 MIN; OPERATIVE LEFT HIP WITH PELVIS FLUOROSCOPY TIME:  57 seconds COMPARISON:  Left hip radiographs - 07/11/2017 FINDINGS: Five spot intraoperative fluoroscopic anterior projection images of the lower pelvis and left hip are provided for review  Images demonstrate the sequela of left total hip replacement. Alignment appears anatomic given solitary AP projection. No definite fracture given solitary AP projection. There is expected subcutaneous emphysema about the operative site. No radiopaque foreign body. IMPRESSION: Post uncomplicated left total hip replacement. Electronically Signed   By: Sandi Mariscal M.D.   On: 08/01/2017 09:17   Dg Hip Operative Unilat W Or W/o Pelvis Left  Result Date: 08/01/2017 CLINICAL DATA:  Post left total hip replacement. EXAM: DG C-ARM 61-120 MIN; OPERATIVE LEFT HIP WITH PELVIS FLUOROSCOPY TIME:  57 seconds COMPARISON:  Left hip radiographs - 07/11/2017 FINDINGS: Five spot intraoperative fluoroscopic anterior projection images of the lower pelvis and left hip are provided for review Images demonstrate the sequela of left total hip replacement. Alignment appears anatomic given solitary AP projection. No definite fracture given solitary AP projection. There is expected subcutaneous emphysema about the operative site. No radiopaque foreign body. IMPRESSION: Post uncomplicated left total hip replacement. Electronically Signed   By: Sandi Mariscal M.D.   On: 08/01/2017 09:17   Dg Hip Unilat With Pelvis 2-3 Views Left  Result Date: 07/31/2017 CLINICAL DATA:  Left hip pain since a fall 3 days ago. Initial encounter. EXAM: DG HIP (WITH OR WITHOUT PELVIS) 2-3V LEFT COMPARISON:  None. FINDINGS: The patient has a left femoral neck fracture. The fracture appears to be subcapital. The femoral head is located. No other bony or joint abnormality is seen. IMPRESSION: Acute left femoral neck fracture appears to be subcapital. Electronically Signed   By: Inge Rise M.D.   On: 07/31/2017 15:28        Scheduled Meds: . aspirin  81 mg Oral BID  . docusate sodium  100 mg Oral BID  . enoxaparin (LOVENOX) injection  40 mg Subcutaneous Q24H  . famotidine  10 mg Oral TID AC  . gabapentin  600 mg Oral BID  . insulin aspart  0-15  Units Subcutaneous TID WC  . insulin aspart  0-5 Units Subcutaneous QHS   Continuous Infusions: . sodium chloride 75 mL/hr at 08/01/17 1440  . sodium chloride    . methocarbamol (ROBAXIN)  IV       LOS: 2 days    Time spent: 35min    Domenic Polite, MD [facility] Page via [email or web address], password [email or web address] After 7PM please contact night-coverage  08/02/2017, 12:25 PM

## 2017-08-03 ENCOUNTER — Other Ambulatory Visit: Payer: Self-pay | Admitting: Family Medicine

## 2017-08-03 ENCOUNTER — Encounter (HOSPITAL_COMMUNITY): Payer: Self-pay | Admitting: Orthopaedic Surgery

## 2017-08-03 LAB — CBC
HCT: 21.1 % — ABNORMAL LOW (ref 39.0–52.0)
HEMOGLOBIN: 7.1 g/dL — AB (ref 13.0–17.0)
MCH: 31.6 pg (ref 26.0–34.0)
MCHC: 33.6 g/dL (ref 30.0–36.0)
MCV: 93.8 fL (ref 78.0–100.0)
Platelets: 121 10*3/uL — ABNORMAL LOW (ref 150–400)
RBC: 2.25 MIL/uL — ABNORMAL LOW (ref 4.22–5.81)
RDW: 14.6 % (ref 11.5–15.5)
WBC: 4.8 10*3/uL (ref 4.0–10.5)

## 2017-08-03 LAB — PREPARE RBC (CROSSMATCH)

## 2017-08-03 LAB — FOLATE RBC
FOLATE, RBC: 1175 ng/mL (ref >498)
Folate, Hemolysate: 270.2 ng/mL
Hematocrit: 23 % — ABNORMAL LOW (ref 37.5–51.0)

## 2017-08-03 LAB — GLUCOSE, CAPILLARY
GLUCOSE-CAPILLARY: 277 mg/dL — AB (ref 65–99)
GLUCOSE-CAPILLARY: 172 mg/dL — AB (ref 65–99)
Glucose-Capillary: 311 mg/dL — ABNORMAL HIGH (ref 65–99)

## 2017-08-03 LAB — H. PYLORI ANTIBODY, IGG: H Pylori IgG: 7.1 Index Value — ABNORMAL HIGH (ref 0.00–0.79)

## 2017-08-03 MED ORDER — FUROSEMIDE 10 MG/ML IJ SOLN
20.0000 mg | Freq: Once | INTRAMUSCULAR | Status: AC
  Administered 2017-08-03: 20 mg via INTRAVENOUS
  Filled 2017-08-03: qty 2

## 2017-08-03 MED ORDER — SODIUM CHLORIDE 0.9 % IV SOLN: Freq: Once | INTRAVENOUS | Status: DC

## 2017-08-03 MED ORDER — INSULIN GLARGINE 100 UNIT/ML ~~LOC~~ SOLN
30.0000 [IU] | Freq: Every day | SUBCUTANEOUS | Status: DC
  Administered 2017-08-03: 30 [IU] via SUBCUTANEOUS
  Filled 2017-08-03: qty 0.3

## 2017-08-03 NOTE — Progress Notes (Signed)
Physical Therapy Treatment Patient Details Name: Melvin [name] MRN: [id number] DOB: [birth year] Today's Date: 08/03/2017    History of Present Illness Melvin [name] is a 57 y/o male admitted on 07/31/17 after a fall within the home with resultant L hip fracture. L THA (anterior) on 08/01/17. Patient with a PMH significant for poorly controlled DM, recent L distal ulnar osteomyelitis, pancreatic insufficiency and hx of pancreatitis, neuropathy, CHF.    PT Comments    Patient is making progress toward mobility goals and tolerated gait and stair training well. Continue to progress as tolerated.    Follow Up Recommendations  Home health PT;Supervision for mobility/OOB     Equipment Recommendations  Rolling walker with 5" wheels    Recommendations for Other Services       Precautions / Restrictions Precautions Precautions: Fall Restrictions Weight Bearing Restrictions: Yes LLE Weight Bearing: Weight bearing as tolerated    Mobility  Bed Mobility Overal bed mobility: Modified Independent Bed Mobility: Supine to Sit           General bed mobility comments: increased time and effort  Transfers Overall transfer level: Needs assistance Equipment used: Rolling walker (2 wheeled) Transfers: Sit to/from Stand Sit to Stand: Min guard         General transfer comment: cues for safe hand placement  Ambulation/Gait Ambulation/Gait assistance: Supervision Ambulation Distance (Feet): 175 Feet Assistive device: Rolling walker (2 wheeled) Gait Pattern/deviations: Step-through pattern;Decreased stride length;Trunk flexed Gait velocity: decreased   General Gait Details: cues for posture, sequencing, and step length symmetry   Stairs Stairs: Yes   Stair Management: No rails;Step to pattern;Backwards;With walker Number of Stairs: (2 steps X2) General stair comments: cues for sequencing and technqiue; assist to stabilize RW  Wheelchair Mobility    Modified Rankin (Stroke  Patients Only)       Balance Overall balance assessment: Needs assistance Sitting-balance support: Feet supported Sitting balance-[name]: Good     Standing balance support: Bilateral upper extremity supported;During functional activity Standing balance-[name]: Fair                              Cognition Arousal/Alertness: Awake/alert Behavior During Therapy: WFL for tasks assessed/performed Overall Cognitive Status: Within Functional Limits for tasks assessed                                        Exercises      General Comments General comments (skin integrity, edema, etc.): pt given Stairs with walker handout      Pertinent Vitals/Pain Pain Assessment: Faces Faces Pain Scale: Hurts little more Pain Location: L hip Pain Descriptors / Indicators: Sore Pain Intervention(s): Limited activity within patient's tolerance;Monitored during session;Premedicated before session;Repositioned    Home Living                      Prior Function            PT Goals (current goals can now be found in the care plan section) Acute Rehab PT Goals PT Goal Formulation: With patient/family Time For Goal Achievement: 08/08/17 Potential to Achieve Goals: Good Progress towards PT goals: Progressing toward goals    Frequency    7X/week      PT Plan Current plan remains appropriate    Co-evaluation  AM-PAC PT "6 Clicks" Daily Activity  Outcome Measure  Difficulty turning over in bed (including adjusting bedclothes, sheets and blankets)?: A Lot Difficulty moving from lying on back to sitting on the side of the bed? : A Lot Difficulty sitting down on and standing up from a chair with arms (e.g., wheelchair, bedside commode, etc,.)?: Unable Help needed moving to and from a bed to chair (including a wheelchair)?: A Little Help needed walking in hospital room?: A Little Help needed climbing 3-5 steps with a railing?  : A Little 6 Click Score: 14    End of Session Equipment Utilized During Treatment: Gait belt Activity Tolerance: Patient tolerated treatment well Patient left: with call bell/phone within reach;in chair Nurse Communication: Mobility status PT Visit Diagnosis: Unsteadiness on feet (R26.81);Other abnormalities of gait and mobility (R26.89);Muscle weakness (generalized) (M62.81);History of falling (Z91.81);Difficulty in walking, not elsewhere classified (R26.2)     Time: 1000-1024 PT Time Calculation (min) (ACUTE ONLY): 24 min  Charges:  $Gait Training: 23-37 mins                    G Codes:       Earney Navy, PTA Pager: [phone]     Darliss Cheney 08/03/2017, 1:44 PM

## 2017-08-03 NOTE — Progress Notes (Signed)
Occupational Therapy Treatment Patient Details Name: Melvin [name] MRN: [id number] DOB: 1960-07-04 Today's Date: 08/03/2017    History of present illness Mr. Blomquist is a 57 y/o male admitted on 07/31/17 after a fall within the home with resultant L hip fracture. L THA (anterior) on 08/01/17. Patient with a PMH significant for poorly controlled DM, recent L distal ulnar osteomyelitis, pancreatic insufficiency and hx of pancreatitis, neuropathy, CHF.   OT comments  OT assisted pt to gym via recliner chair for energy conservation and time management. Pt continues to benefit from OT intervention. OT demonstrated simulated shower transfer with use of shower seat placed in tub shower. Pt required steady assistance for safety with transfer and recommended someone be present when pt transferring in and out at home. Pt verbalized understanding. OT reviewed AE hip kit as well with pt verbalizing he currently had reacher and would like to ask for assistance if needed at home.    Follow Up Recommendations  Supervision - Intermittent    Equipment Recommendations  3 in 1 bedside commode    Recommendations for Other Services      Precautions / Restrictions Precautions Precautions: Fall Restrictions Weight Bearing Restrictions: Yes LLE Weight Bearing: Weight bearing as tolerated       Mobility  Transfers Overall transfer level: Needs assistance Equipment used: Rolling walker (2 wheeled) Transfers: Sit to/from Stand Sit to Stand: Min guard         General transfer comment: VC for hand placement and for equal weight shift once in standing position    Balance Overall balance assessment: Needs assistance Sitting-balance support: Feet supported Sitting balance-[name]: Good     Standing balance support: Bilateral upper extremity supported;During functional activity Standing balance-[name]: Fair      ADL either performed or assessed with clinical judgement   ADL Overall  ADL's : Needs assistance/impaired         Tub/ Shower Transfer: Tub transfer;Min guard;Rolling walker;Shower seat     General ADL Comments: simulated shower transfer with use of shower chair and RW. Pt able to place B LE's over tub ledge with overall steady assistance. OT demonstrated use of LH reacher, sock aide, and sponge. Pt reports he will have help for these tasks at home. He does own Secondary school teacher.               Cognition Arousal/Alertness: Awake/alert Behavior During Therapy: WFL for tasks assessed/performed Overall Cognitive Status: Within Functional Limits for tasks assessed                      Pertinent Vitals/ Pain       Pain Assessment: Faces Faces Pain Scale: Hurts little more Pain Location: L hip Pain Descriptors / Indicators: Grimacing;Guarding Pain Intervention(s): Monitored during session;Repositioned  Home Living   Prior Functioning/Environment    Frequency  Min 2X/week        Progress Toward Goals  OT Goals(current goals can now be found in the care plan section)  Progress towards OT goals: Progressing toward goals     Plan Discharge plan remains appropriate       AM-PAC PT "6 Clicks" Daily Activity     Outcome Measure   Help from another person eating meals?: None Help from another person taking care of personal grooming?: A Little Help from another person toileting, which includes using toliet, bedpan, or urinal?: A Little Help from another person bathing (including washing, rinsing, drying)?: A Lot Help from another person to put  on and taking off regular upper body clothing?: A Little Help from another person to put on and taking off regular lower body clothing?: A Lot 6 Click Score: 17    End of Session Equipment Utilized During Treatment: Rolling walker;Other (comment)(shower seat)  OT Visit Diagnosis: Unsteadiness on feet (R26.81);Other abnormalities of gait and mobility (R26.89);History of falling (Z91.81);Pain Pain -  Right/Left: Left Pain - part of body: Hip   Activity Tolerance Patient tolerated treatment well   Patient Left with call bell/phone within reach;in chair   Nurse Communication          Time: 1157-2620 OT Time Calculation (min): 30 min  Charges: OT General Charges $OT Visit: 1 Visit OT Treatments $Self Care/Home Management : 23-37 mins    Elva Breaker P, MS, OTR/L 08/03/2017, 11:18 AM

## 2017-08-03 NOTE — Care Management Note (Addendum)
Case Management Note  Patient Details  Name: Melvin [name] MRN: [id number] Date of Birth: Feb 22, 1961  Subjective/Objective:   57 yr old gentleman admitted after a fall with left hip fracture. Patient is s/p left Total Hip arthroplasty.                 Action/Plan: Case manager spoke with patient concerning discharge plan and DME.Marland Kitchen Choice for Home Health agency was offered, patient says he is active with [facility]. Case manager called Neoma Laming, [place] Liaison to confirm. Case manager requested benefit check for lovenox cost.   Expected Discharge Date:    08/04/17              Expected Discharge Plan:  [place]  In-House Referral:  NA  Discharge planning Services  CM Consult  Post Acute Care Choice:  Home Health, Resumption of Svcs/PTA Provider Choice offered to:  Patient  DME Arranged:   3in1, RW DME Agency:  [facility] Arranged:  PT [place] Agency:  [facility]  Status of Service:  completed If discussed at [place] of Stay Meetings, dates discussed:    Additional Comments: 1.LOVENOX 40 MG SUBQ DAILY 31 DAYS SYRINGES  COVER-NOT COVER  PRIOR APPROVAL- YES ATTN: H.C.P.R- TEAM # [phone] FOR EXCEPTION   2. ENOXAPARIN 40 MG SUBQ DAILY  COVER- YES  CO-PAY- $ 3.40  ( patient has L.I.S )  TIER- 4 DRUG  PRIOR APPROVAL- NO   PREFERRED PHARMACY : [facility], [facility]   Ninfa Meeker, RN 08/03/2017, 3:32 PM

## 2017-08-03 NOTE — Progress Notes (Signed)
PROGRESS NOTE    Melvin [name]  HGD:[id number] DOB: 1961/06/02 DOA: 07/31/2017 PCP: Charlott Rakes, MD  Brief Narrative:Melvin [name] is a 57 y.o. male with medical history significant for poorly-controlled diabetes, recent left distal ulnar osteomyelitis s/p 6 wks ceftriaxone that ended 2/12, pancreatic insufficiency and hx pancreatitis, neuropathy, who presents after a fall 2 days ago at home. Says suffered from hypoglycemia that day (sweating, palpitations, did not check blood sugar), became weak and fell, landing on left hip, Xray with fracture. -Underwent left total hip replacement 2/23  Assessment & Plan:   left closed femoral neck fracture -Orthopedics Dr. Ninfa Linden consulted, -Underwent left total hip replacement this morning 2/23  -Start Lovenox for DVT prophylaxis, start IV fluids today -Physical therapy consultation -Home with home health services tomorrow  Acute blood loss anemia -Secondary to postoperative blood loss and hemodilution - give 1 unit PRBC and lasix, CBC in am  Type 2 diabetes mellitus -resume home dose of Lantus, sliding scale insulin  Chronic kidney disease disease stage II -Stable, monitor, stop IV fluids  Recent left distal osteomyelitis -Completed six-week course of IV ceftriaxone on 2/12 -Need to remove PICC line prior to discharge  Mild chronic thrombocytopenia -Likely secondary to alcohol use -B12 is low, started replacement  DVT prophylaxis: Lovenox  Code Status: full  Family Communication:  no family at bedside Disposition Plan: home with home health services tomorrow   Consults called: orthopedis dr. Ninfa Linden     Procedures: Left total hip hemiarthroplasty yesterday 2/23  Antimicrobials:    Subjective: -no complaints, getting blood  Objective: Vitals:   08/03/17 1115 08/03/17 1130 08/03/17 1203 08/03/17 1407  BP: 91/60 91/60 (!) 105/57 112/76  Pulse: 86 86 82 92  Resp: 18 18 18 18   Temp: 98 F (36.7 C) 98 F  (36.7 C) (!) 97.5 F (36.4 C) 98 F (36.7 C)  TempSrc: Oral Oral Oral Oral  SpO2: 100% 100% 100% 100%    Intake/Output Summary (Last 24 hours) at 08/03/2017 1540 Last data filed at 08/03/2017 1538 Gross per 24 hour  Intake 887.08 ml  Output -  Net 887.08 ml   There were no vitals filed for this visit.  Examination:  Gen: Awake, Alert, Oriented X 3,  HEENT: PERRLA, Neck supple, no JVD Lungs: Good air movement bilaterally, CTAB CVS: RRR,No Gallops,Rubs or new Murmurs Abd: soft, Non tender, non distended, BS present Extremities: No edema, left hip with dressing, incision is unremarkable no surrounding erythema or hematoma noted Skin: no new rashes Psychiatry: Judgement and insight appear normal. Mood & affect appropriate.     Data Reviewed:   CBC: Recent Labs  Lab 07/31/17 1855 08/02/17 0320 08/03/17 0332  WBC 5.3 5.8 4.8  NEUTROABS 3.1  --   --   HGB 10.9* 8.0* 7.1*  HCT 33.0* 23.9* 21.1*  MCV 95.7 94.1 93.8  PLT 145* 123* 962*   Basic Metabolic Panel: Recent Labs  Lab 07/31/17 1855 08/01/17 0349 08/02/17 0320  NA 137 136 134*  K 4.2 3.6 4.5  CL 112* 111 108  CO2 18* 20* 20*  GLUCOSE 391* 109* 296*  BUN 20 17 19   CREATININE 1.40* 1.11 1.52*  CALCIUM 9.0 8.6* 8.5*   GFR: Estimated Creatinine Clearance: 51.3 mL/min (A) (by C-G formula based on SCr of 1.52 mg/dL (H)). Liver Function Tests: No results for input(s): AST, ALT, ALKPHOS, BILITOT, PROT, ALBUMIN in the last 168 hours. No results for input(s): LIPASE, AMYLASE in the last 168 hours. No results for  input(s): AMMONIA in the last 168 hours. Coagulation Profile: No results for input(s): INR, PROTIME in the last 168 hours. Cardiac Enzymes: No results for input(s): CKTOTAL, CKMB, CKMBINDEX, TROPONINI in the last 168 hours. BNP (last 3 results) No results for input(s): PROBNP in the last 8760 hours. HbA1C: No results for input(s): HGBA1C in the last 72 hours. CBG: Recent Labs  Lab 08/02/17 1143  08/02/17 1606 08/02/17 2113 08/03/17 0615 08/03/17 1230  GLUCAP 108* 188* 210* 311* 277*   Lipid Profile: No results for input(s): CHOL, HDL, LDLCALC, TRIG, CHOLHDL, LDLDIRECT in the last 72 hours. Thyroid Function Tests: No results for input(s): TSH, T4TOTAL, FREET4, T3FREE, THYROIDAB in the last 72 hours. Anemia Panel: Recent Labs    08/01/17 0349  VITAMINB12 123*   Urine analysis:    Component Value Date/Time   COLORURINE AMBER (A) 06/10/2017 1055   APPEARANCEUR HAZY (A) 06/10/2017 1055   LABSPEC 1.020 06/10/2017 1055   PHURINE 5.0 06/10/2017 1055   GLUCOSEU 50 (A) 06/10/2017 1055   HGBUR MODERATE (A) 06/10/2017 1055   BILIRUBINUR NEGATIVE 06/10/2017 1055   BILIRUBINUR small 05/28/2015 1612   KETONESUR 5 (A) 06/10/2017 1055   PROTEINUR 100 (A) 06/10/2017 1055   UROBILINOGEN 1.0 05/28/2015 1612   UROBILINOGEN 1.0 10/24/2014 1215   NITRITE NEGATIVE 06/10/2017 1055   LEUKOCYTESUR NEGATIVE 06/10/2017 1055   Sepsis Labs: @LABRCNTIP (procalcitonin:4,lacticidven:4)  ) Recent Results (from the past 240 hour(s))  Surgical pcr screen     Status: None   Collection Time: 07/31/17  9:58 PM  Result Value Ref Range Status   MRSA, PCR NEGATIVE NEGATIVE Final   Staphylococcus aureus NEGATIVE NEGATIVE Final    Comment: (NOTE) The Xpert SA Assay (FDA approved for NASAL specimens in patients 60 years of age and older), is one component of a comprehensive surveillance program. It is not intended to diagnose infection nor to guide or monitor treatment. Performed at [facility], [place] [address] [place], [place] [address]          Radiology Studies: No results found.      Scheduled Meds: . aspirin  81 mg Oral BID  . docusate sodium  100 mg Oral BID  . enoxaparin (LOVENOX) injection  40 mg Subcutaneous Q24H  . famotidine  10 mg Oral TID AC  . gabapentin  600 mg Oral BID  . insulin aspart  0-15 Units Subcutaneous TID WC  . insulin aspart  0-5 Units Subcutaneous  QHS   Continuous Infusions: . sodium chloride    . sodium chloride    . sodium chloride    . methocarbamol (ROBAXIN)  IV       LOS: 3 days    Time spent: 49min    Domenic Polite, MD [facility] Page via [email or web address], password [email or web address] After 7PM please contact night-coverage  08/03/2017, 3:40 PM

## 2017-08-03 NOTE — Progress Notes (Signed)
Physical Therapy Treatment Patient Details Name: Melvin [name] MRN: [id number] DOB: 1960/06/25 Today's Date: 08/03/2017    History of Present Illness Melvin [name] is a 57 y/o male admitted on 07/31/17 after a fall within the home with resultant L hip fracture. L THA (anterior) on 08/01/17. Patient with a PMH significant for poorly controlled DM, recent L distal ulnar osteomyelitis, pancreatic insufficiency and hx of pancreatitis, neuropathy, CHF.    PT Comments    This session focused on LE therex and pt tolerated session well. Current plan remains appropriate.    Follow Up Recommendations  Home health PT;Supervision for mobility/OOB     Equipment Recommendations  Rolling walker with 5" wheels    Recommendations for Other Services       Precautions / Restrictions Precautions Precautions: Fall Restrictions Weight Bearing Restrictions: Yes LLE Weight Bearing: Weight bearing as tolerated    Mobility  Bed Mobility Overal bed mobility: Modified Independent Bed Mobility: Supine to Sit           General bed mobility comments: increased time and effort  Transfers Overall transfer level: Needs assistance Equipment used: Rolling walker (2 wheeled) Transfers: Sit to/from Stand Sit to Stand: Supervision         General transfer comment: supervision for safety  Ambulation/Gait Ambulation/Gait assistance: Supervision Ambulation Distance (Feet): 175 Feet Assistive device: Rolling walker (2 wheeled) Gait Pattern/deviations: Step-through pattern;Decreased stride length;Trunk flexed Gait velocity: decreased   General Gait Details: cues for posture, sequencing, and step length symmetry   Stairs Stairs: Yes   Stair Management: No rails;Step to pattern;Backwards;With walker Number of Stairs: (2 steps X2) General stair comments: cues for sequencing and technqiue; assist to stabilize RW  Wheelchair Mobility    Modified Rankin (Stroke Patients Only)        Balance Overall balance assessment: Needs assistance Sitting-balance support: Feet supported Sitting balance-[name]: Good     Standing balance support: Bilateral upper extremity supported;During functional activity Standing balance-[name]: Fair                              Cognition Arousal/Alertness: Awake/alert Behavior During Therapy: WFL for tasks assessed/performed Overall Cognitive Status: Within Functional Limits for tasks assessed                                        Exercises Total Joint Exercises Ankle Circles/Pumps: AROM;Both;20 reps Gluteal Sets: AROM;Both;10 reps Short Arc Quad: AROM;Left;10 reps Heel Slides: AROM;Left;10 reps Hip ABduction/ADduction: AROM;Left;Supine;Standing;Other (comment)(10 standing and 10 supine) Long Arc Quad: AROM;Left;10 reps;Seated Knee Flexion: AROM;Left;10 reps;Standing Marching in Standing: AROM;Left;10 reps;Standing    General Comments General comments (skin integrity, edema, etc.): pt given Stairs with walker handout      Pertinent Vitals/Pain Pain Assessment: Faces Faces Pain Scale: Hurts little more Pain Location: L hip Pain Descriptors / Indicators: Sore;Tightness Pain Intervention(s): Limited activity within patient's tolerance;Monitored during session;Premedicated before session;Repositioned    Home Living                      Prior Function            PT Goals (current goals can now be found in the care plan section) Acute Rehab PT Goals Patient Stated Goal: return home, get back on his feet PT Goal Formulation: With patient/family Time For Goal Achievement: 08/08/17 Potential to  Achieve Goals: Good Progress towards PT goals: Progressing toward goals    Frequency    7X/week      PT Plan Current plan remains appropriate    Co-evaluation              AM-PAC PT "6 Clicks" Daily Activity  Outcome Measure  Difficulty turning over in bed (including  adjusting bedclothes, sheets and blankets)?: A Lot Difficulty moving from lying on back to sitting on the side of the bed? : A Lot Difficulty sitting down on and standing up from a chair with arms (e.g., wheelchair, bedside commode, etc,.)?: Unable Help needed moving to and from a bed to chair (including a wheelchair)?: A Little Help needed walking in hospital room?: A Little Help needed climbing 3-5 steps with a railing? : A Little 6 Click Score: 14    End of Session Equipment Utilized During Treatment: Gait belt Activity Tolerance: Patient tolerated treatment well Patient left: with call bell/phone within reach;in bed Nurse Communication: Mobility status PT Visit Diagnosis: Unsteadiness on feet (R26.81);Other abnormalities of gait and mobility (R26.89);Muscle weakness (generalized) (M62.81);History of falling (Z91.81);Difficulty in walking, not elsewhere classified (R26.2)     Time: 1638-4536 PT Time Calculation (min) (ACUTE ONLY): 33 min  Charges:   $Therapeutic Exercise: 23-37 mins                    G Codes:       Earney Navy, PTA Pager: [phone]     Darliss Cheney 08/03/2017, 2:47 PM

## 2017-08-03 NOTE — Plan of Care (Signed)
  Progressing Education: Knowledge of General Education information will improve 08/03/2017 1832 - Progressing by Darletta Moll, RN Health Behavior/Discharge Planning: Ability to manage health-related needs will improve 08/03/2017 1832 - Progressing by Darletta Moll, RN Clinical Measurements: Ability to maintain clinical measurements within normal limits will improve 08/03/2017 1832 - Progressing by Darletta Moll, RN Will remain free from infection 08/03/2017 1832 - Progressing by Darletta Moll, RN Diagnostic test results will improve 08/03/2017 1832 - Progressing by Darletta Moll, RN Respiratory complications will improve 08/03/2017 1832 - Progressing by Darletta Moll, RN Cardiovascular complication will be avoided 08/03/2017 1832 - Progressing by Darletta Moll, RN Activity: Risk for activity intolerance will decrease 08/03/2017 1832 - Progressing by Darletta Moll, RN Nutrition: Adequate nutrition will be maintained 08/03/2017 1832 - Progressing by Darletta Moll, RN Coping: Level of anxiety will decrease 08/03/2017 1832 - Progressing by Darletta Moll, RN Elimination: Will not experience complications related to bowel motility 08/03/2017 1832 - Progressing by Darletta Moll, RN Will not experience complications related to urinary retention 08/03/2017 1832 - Progressing by Darletta Moll, RN Pain Managment: General experience of comfort will improve 08/03/2017 1832 - Progressing by Darletta Moll, RN Safety: Ability to remain free from injury will improve 08/03/2017 1832 - Progressing by Darletta Moll, RN Skin Integrity: Risk for impaired skin integrity will decrease 08/03/2017 1832 - Progressing by Darletta Moll, RN

## 2017-08-03 NOTE — Progress Notes (Signed)
Subjective: 2 Days Post-Op Procedure(s) (LRB): LEFT TOTAL HIP REPLACEMENT ANTERIOR APPROACH (Left) Patient reports pain as mild.  Acute blood loss anemia from his surgery, but also acute on chronic due to kidney disease.  Objective: Vital signs in last 24 hours: Temp:  [98.7 F (37.1 C)-99.5 F (37.5 C)] 99.5 F (37.5 C) (02/25 0503) Pulse Rate:  [94-99] 99 (02/25 0503) Resp:  [16-18] 17 (02/25 0503) BP: (85-113)/(54-65) 108/59 (02/25 0503) SpO2:  [96 %-100 %] 96 % (02/25 0503)  Intake/Output from previous day: No intake/output data recorded. Intake/Output this shift: No intake/output data recorded.  Recent Labs    07/31/17 1855 08/02/17 0320 08/03/17 0332  HGB 10.9* 8.0* 7.1*   Recent Labs    08/02/17 0320 08/03/17 0332  WBC 5.8 4.8  RBC 2.54* 2.25*  HCT 23.9* 21.1*  PLT 123* 121*   Recent Labs    08/01/17 0349 08/02/17 0320  NA 136 134*  K 3.6 4.5  CL 111 108  CO2 20* 20*  BUN 17 19  CREATININE 1.11 1.52*  GLUCOSE 109* 296*  CALCIUM 8.6* 8.5*   No results for input(s): LABPT, INR in the last 72 hours.  Sensation intact distally Intact pulses distally Dorsiflexion/Plantar flexion intact Incision: scant drainage  Assessment/Plan: 2 Days Post-Op Procedure(s) (LRB): LEFT TOTAL HIP REPLACEMENT ANTERIOR APPROACH (Left) Up with therapy  Will transfuse today due to ABLA combined with his worsening creatinine.  Mcarthur Rossetti 08/03/2017, 7:47 AM

## 2017-08-04 LAB — CBC
HEMATOCRIT: 25.4 % — AB (ref 39.0–52.0)
Hemoglobin: 8.4 g/dL — ABNORMAL LOW (ref 13.0–17.0)
MCH: 30.2 pg (ref 26.0–34.0)
MCHC: 33.1 g/dL (ref 30.0–36.0)
MCV: 91.4 fL (ref 78.0–100.0)
PLATELETS: 159 10*3/uL (ref 150–400)
RBC: 2.78 MIL/uL — ABNORMAL LOW (ref 4.22–5.81)
RDW: 15 % (ref 11.5–15.5)
WBC: 5.9 10*3/uL (ref 4.0–10.5)

## 2017-08-04 LAB — GLUCOSE, CAPILLARY
Glucose-Capillary: 203 mg/dL — ABNORMAL HIGH (ref 65–99)
Glucose-Capillary: 241 mg/dL — ABNORMAL HIGH (ref 65–99)

## 2017-08-04 MED ORDER — SENNA 8.6 MG PO TABS: 1.0000 | ORAL_TABLET | Freq: Every evening | ORAL | 0 refills | Status: DC | PRN

## 2017-08-04 MED ORDER — ENOXAPARIN (LOVENOX) PATIENT EDUCATION KIT
PACK | Freq: Once | Status: AC
  Administered 2017-08-04: 14:00:00
  Filled 2017-08-04: qty 1

## 2017-08-04 MED ORDER — ENOXAPARIN SODIUM 40 MG/0.4ML ~~LOC~~ SOLN: 40.0000 mg | SUBCUTANEOUS | 0 refills | Status: DC

## 2017-08-04 MED ORDER — HYDROCODONE-ACETAMINOPHEN 5-325 MG PO TABS: 1.0000 | ORAL_TABLET | Freq: Four times a day (QID) | ORAL | 0 refills | Status: DC | PRN

## 2017-08-04 NOTE — Progress Notes (Signed)
Physical Therapy Treatment Patient Details Name: Melvin [name] MRN: [id number] DOB: 1961-03-21 Today's Date: 08/04/2017    History of Present Illness Melvin [name] is a 56 y/o male admitted on 07/31/17 after a fall within the home with resultant L hip fracture. L THA (anterior) on 08/01/17. Patient with a PMH significant for poorly controlled DM, recent L distal ulnar osteomyelitis, pancreatic insufficiency and hx of pancreatitis, neuropathy, CHF.    PT Comments    Patient became a little more fatigued with mobility this session however continues to mobilize safely with supervision. Pt educated on cart transfer. Current plan remains appropriate.    Follow Up Recommendations  Home health PT;Supervision for mobility/OOB     Equipment Recommendations  Rolling walker with 5" wheels    Recommendations for Other Services       Precautions / Restrictions Precautions Precautions: Fall Restrictions Weight Bearing Restrictions: Yes LLE Weight Bearing: Weight bearing as tolerated    Mobility  Bed Mobility               General bed mobility comments: pt OOB in chair upon arrival  Transfers Overall transfer level: Needs assistance Equipment used: Rolling walker (2 wheeled) Transfers: Sit to/from Stand Sit to Stand: Supervision         General transfer comment: supervision for safety  Ambulation/Gait Ambulation/Gait assistance: Supervision Ambulation Distance (Feet): 200 Feet Assistive device: Rolling walker (2 wheeled) Gait Pattern/deviations: Step-through pattern;Decreased weight shift to left;Antalgic;Decreased stance time - left;Decreased stride length;Trunk flexed Gait velocity: decreased   General Gait Details: cues for proximity to RW and posture; pt with decreased stride length this session   Stairs            Wheelchair Mobility    Modified Rankin (Stroke Patients Only)       Balance Overall balance assessment: Needs assistance Sitting-balance  support: Feet supported Sitting balance-[name]: Good     Standing balance support: Bilateral upper extremity supported;During functional activity Standing balance-[name]: Fair                              Cognition Arousal/Alertness: Awake/alert Behavior During Therapy: WFL for tasks assessed/performed Overall Cognitive Status: Within Functional Limits for tasks assessed                                        Exercises      General Comments General comments (skin integrity, edema, etc.): car transfer simulated      Pertinent Vitals/Pain Pain Assessment: Faces Faces Pain Scale: Hurts little more Pain Location: L hip Pain Descriptors / Indicators: Sore;Tightness Pain Intervention(s): Limited activity within patient's tolerance;Monitored during session;Repositioned;Premedicated before session    Home Living                      Prior Function            PT Goals (current goals can now be found in the care plan section) Acute Rehab PT Goals PT Goal Formulation: With patient/family Time For Goal Achievement: 08/08/17 Potential to Achieve Goals: Good Progress towards PT goals: Progressing toward goals    Frequency    7X/week      PT Plan Current plan remains appropriate    Co-evaluation              AM-PAC PT "6 Clicks" Daily  Activity  Outcome Measure  Difficulty turning over in bed (including adjusting bedclothes, sheets and blankets)?: None Difficulty moving from lying on back to sitting on the side of the bed? : A Little Difficulty sitting down on and standing up from a chair with arms (e.g., wheelchair, bedside commode, etc,.)?: A Lot Help needed moving to and from a bed to chair (including a wheelchair)?: None Help needed walking in hospital room?: A Little Help needed climbing 3-5 steps with a railing? : A Little 6 Click Score: 19    End of Session Equipment Utilized During Treatment: Gait  belt Activity Tolerance: Patient tolerated treatment well Patient left: with call bell/phone within reach;in chair Nurse Communication: Mobility status PT Visit Diagnosis: Unsteadiness on feet (R26.81);Other abnormalities of gait and mobility (R26.89);Muscle weakness (generalized) (M62.81);History of falling (Z91.81);Difficulty in walking, not elsewhere classified (R26.2)     Time: 1338-1400 PT Time Calculation (min) (ACUTE ONLY): 22 min  Charges:  $Gait Training: 8-22 mins                    G Codes:       Earney Navy, PTA Pager: [phone]     Darliss Cheney 08/04/2017, 2:12 PM

## 2017-08-04 NOTE — Discharge Summary (Signed)
Patient ID: Melvin [name] MRN: [id number] DOB/AGE: 59-Apr-1962 59 y.o.  Admit date: 07/31/2017 Discharge date: 08/04/2017  Admission Diagnoses:  Principal Problem:   Displaced fracture of base of neck of left femur, initial encounter for closed fracture [facility]) Active Problems:   Diabetes ([place])   ETOH abuse   Essential hypertension   Pancreatic insufficiency   Osteomyelitis (HCC)   Thrombocytopenia (HCC)   Neuropathy   Closed left hip fracture [facility])   Discharge Diagnoses:  Same  Past Medical History:  Diagnosis Date  . Anemia   . Angina   . CHF (congestive heart failure) ([place])   . Diabetes mellitus   . GERD (gastroesophageal reflux disease)   . Hyperlipidemia   . Meningitis   . Neuromuscular disorder ([place])    diabetic neuropathy  . Pancreatitis   . Pneumonia     Surgeries: Procedure(s): LEFT TOTAL HIP REPLACEMENT ANTERIOR APPROACH on 08/01/2017   Consultants: Treatment Team:  Mcarthur Rossetti, MD  Discharged Condition: Improved  Hospital Course: Melvin [name] is an 57 y.o. male who was admitted 07/31/2017 for operative treatment ofDisplaced fracture of base of neck of left femur, initial encounter for closed fracture ([place]). Patient has severe unremitting pain that affects sleep, daily activities, and work/hobbies. After pre-op clearance the patient was taken to the operating room on 08/01/2017 and underwent  Procedure(s): LEFT TOTAL HIP REPLACEMENT ANTERIOR APPROACH.    Patient was given perioperative antibiotics:  Anti-infectives (From admission, onward)   Start     Dose/Rate Route Frequency Ordered Stop   08/01/17 1830  vancomycin (VANCOCIN) IVPB 1000 mg/200 mL premix     1,000 mg 200 mL/hr over 60 Minutes Intravenous  Once 08/01/17 1302 08/01/17 1819   08/01/17 1140  vancomycin (VANCOCIN) IVPB 1000 mg/200 mL premix  Status:  Discontinued     1,000 mg 200 mL/hr over 60 Minutes Intravenous Every 12 hours 08/01/17 1034 08/01/17 1302   08/01/17 0645   vancomycin (VANCOCIN) IVPB 1000 mg/200 mL premix     1,000 mg 200 mL/hr over 60 Minutes Intravenous On call to O.R. 08/01/17 9169 08/01/17 0735   08/01/17 0640  vancomycin (VANCOCIN) 1-5 GM/200ML-% IVPB    Comments:  Henrine Screws   : cabinet override      08/01/17 0640 08/01/17 4503       Patient was given sequential compression devices, early ambulation, and chemoprophylaxis to prevent DVT.  Patient benefited maximally from hospital stay and there were no complications.    Recent vital signs:  Patient Vitals for the past 24 hrs:  BP Temp Temp src Pulse Resp SpO2  08/04/17 1300 136/89 98.1 F (36.7 C) Oral 85 16 100 %  08/04/17 0500 (!) 139/91 99.3 F (37.4 C) Oral 86 17 100 %  08/03/17 2000 112/66 98 F (36.7 C) Oral 99 18 100 %     Recent laboratory studies:  Recent Labs    08/02/17 0320  08/03/17 0332 08/04/17 0414  WBC 5.8  --  4.8 5.9  HGB 8.0*  --  7.1* 8.4*  HCT 23.9*   < > 21.1* 25.4*  PLT 123*  --  121* 159  NA 134*  --   --   --   K 4.5  --   --   --   CL 108  --   --   --   CO2 20*  --   --   --   BUN 19  --   --   --  CREATININE 1.52*  --   --   --   GLUCOSE 296*  --   --   --   CALCIUM 8.5*  --   --   --    < > = values in this interval not displayed.     Discharge Medications:   Allergies as of 08/04/2017   No Known Allergies     Medication List    STOP taking these medications   silver sulfADIAZINE 1 % cream Commonly known as:  SILVADENE     TAKE these medications   ACCU-CHEK NANO SMARTVIEW w/Device Kit Use as directed for 3 times daily testing of blood glucose.   ACCU-CHEK SMARTVIEW test strip Generic drug:  glucose blood USE AS DIRECTED TO TEST BLOOD GLUCOSE THREE TIMES DAILY What changed:  See the new instructions.   accu-chek softclix lancets Use as instructed for 3 times daily testing of blood sugar. E11.9   ACCU-CHEK SOFTCLIX LANCETS lancets Use as instructed for 3 times daily testing of blood sugar. E11.9 What changed:   Another medication with the same name was changed. Make sure you understand how and when to take each.   ACCU-CHEK FASTCLIX LANCETS Misc TEST BLOOD GLUCOSE THREE TIMES DAILY AS DIRECTED What changed:  additional instructions   aspirin 81 MG EC tablet Take 1 tablet (81 mg total) by mouth daily.   atorvastatin 10 MG tablet Commonly known as:  LIPITOR TAKE 1 TABLET ONE TIME DAILY  AT  6  P.M. What changed:  See the new instructions.   B-D SINGLE USE SWABS REGULAR Pads USE AS DIRECTED THREE TIMES DAILY  AND AT BEDTIME   bacitracin ointment Apply topically 2 (two) times daily. What changed:    how much to take  when to take this   enoxaparin 40 MG/0.4ML injection Commonly known as:  LOVENOX Inject 0.4 mLs (40 mg total) into the skin daily for 21 days.   gabapentin 300 MG capsule Commonly known as:  NEURONTIN Take 2 capsules (600 mg total) by mouth 2 (two) times daily.   HYDROcodone-acetaminophen 5-325 MG tablet Commonly known as:  NORCO/VICODIN Take 1-2 tablets by mouth every 6 (six) hours as needed for moderate pain ((score 4 to 6)).   insulin aspart 100 UNIT/ML injection Commonly known as:  novoLOG 150-200, give 3 units 201-250 give 5 units 251-300 give 7 units 301-350 give 9 units 351-400 give 12 units What changed:    how much to take  how to take this  when to take this  additional instructions   insulin glargine 100 UNIT/ML injection Commonly known as:  LANTUS Inject 0.3 mLs (30 Units total) into the skin at bedtime.   Insulin Syringe-Needle U-100 31G X 5/16" 0.5 ML Misc Commonly known as:  B-D INS SYRINGE 0.5CC/31GX5/16 USE TO INJECT INSULIN 3 TIMES A DAY AND AT BEDTIME   naproxen 500 MG tablet Commonly known as:  NAPROSYN TAKE 1 TABLET (500 MG TOTAL) BY MOUTH 2 (TWO) TIMES DAILY WITH A MEAL. What changed:    when to take this  reasons to take this   senna 8.6 MG Tabs tablet Commonly known as:  SENOKOT Take 1 tablet (8.6 mg total) by mouth at  bedtime as needed for mild constipation.   ZENPEP 25000-79000 units Cpep Generic drug:  Pancrelipase (Lip-Prot-Amyl) Take 25,000 Units by mouth 3 (three) times daily with meals.            Durable Medical Equipment  (From admission, onward)  Start     Ordered   08/01/17 1035  DME 3 n 1  Once     08/01/17 1034   08/01/17 1035  DME Walker rolling  Once    Question:  Patient needs a walker to treat with the following condition  Answer:  Status post total replacement of left hip   08/01/17 1034      Diagnostic Studies: Dg Ankle Complete Left  Result Date: 07/31/2017 CLINICAL DATA:  Fall on Wednesday. Left hip fracture. Pain in left ankle. EXAM: LEFT ANKLE COMPLETE - 3+ VIEW COMPARISON:  Lower leg radiographs from 06/10/2017 FINDINGS: Mild spurring of the distal tibial rim. Small plantar calcaneal spur. Mild dorsal talonavicular spurring. No acute bony findings. IMPRESSION: 1. Mild hindfoot spurring. Small plantar calcaneal spur. No acute bony findings in the ankle. Electronically Signed   By: Van Clines M.D.   On: 07/31/2017 19:41   Dg Pelvis Portable  Result Date: 08/01/2017 CLINICAL DATA:  Left total hip replacement. EXAM: PORTABLE PELVIS 1-2 VIEWS COMPARISON:  Left hip x-rays from yesterday. FINDINGS: The left hip demonstrates a total arthroplasty without evidence of hardware failure or complication. There is expected intra-articular air. There is no fracture or dislocation. The alignment is anatomic. Post-surgical changes noted in the surrounding soft tissues. IMPRESSION: Left total hip arthroplasty without evidence of acute postoperative complication. Electronically Signed   By: Titus Dubin M.D.   On: 08/01/2017 09:51   Dg Chest Port 1 View  Result Date: 07/31/2017 CLINICAL DATA:  57 year old male with right-sided PICC. EXAM: PORTABLE CHEST 1 VIEW COMPARISON:  Chest radiograph dated 06/22/2017 FINDINGS: A right-sided PICC is seen with tip over the upper SVC.  There is shallow inspiration. Left lung base densities may represent atelectatic changes or infiltrate. There is no pleural effusion or pneumothorax mild cardiomegaly with mild prominence of the central vasculature. No acute osseous pathology. IMPRESSION: 1. Right-sided PICC with tip over upper SVC. 2. Mild cardiomegaly with mild vascular congestion. 3. Left lung base atelectatic changes. Infiltrate is not excluded. Clinical correlation is recommended. Electronically Signed   By: Anner Crete M.D.   On: 07/31/2017 23:18   Dg C-arm 1-60 Min  Result Date: 08/01/2017 CLINICAL DATA:  Post left total hip replacement. EXAM: DG C-ARM 61-120 MIN; OPERATIVE LEFT HIP WITH PELVIS FLUOROSCOPY TIME:  57 seconds COMPARISON:  Left hip radiographs - 07/11/2017 FINDINGS: Five spot intraoperative fluoroscopic anterior projection images of the lower pelvis and left hip are provided for review Images demonstrate the sequela of left total hip replacement. Alignment appears anatomic given solitary AP projection. No definite fracture given solitary AP projection. There is expected subcutaneous emphysema about the operative site. No radiopaque foreign body. IMPRESSION: Post uncomplicated left total hip replacement. Electronically Signed   By: Sandi Mariscal M.D.   On: 08/01/2017 09:17   Dg Hip Operative Unilat W Or W/o Pelvis Left  Result Date: 08/01/2017 CLINICAL DATA:  Post left total hip replacement. EXAM: DG C-ARM 61-120 MIN; OPERATIVE LEFT HIP WITH PELVIS FLUOROSCOPY TIME:  57 seconds COMPARISON:  Left hip radiographs - 07/11/2017 FINDINGS: Five spot intraoperative fluoroscopic anterior projection images of the lower pelvis and left hip are provided for review Images demonstrate the sequela of left total hip replacement. Alignment appears anatomic given solitary AP projection. No definite fracture given solitary AP projection. There is expected subcutaneous emphysema about the operative site. No radiopaque foreign body.  IMPRESSION: Post uncomplicated left total hip replacement. Electronically Signed   By: Eldridge Abrahams.D.  On: 08/01/2017 09:17   Dg Hip Unilat With Pelvis 2-3 Views Left  Result Date: 07/31/2017 CLINICAL DATA:  Left hip pain since a fall 3 days ago. Initial encounter. EXAM: DG HIP (WITH OR WITHOUT PELVIS) 2-3V LEFT COMPARISON:  None. FINDINGS: The patient has a left femoral neck fracture. The fracture appears to be subcapital. The femoral head is located. No other bony or joint abnormality is seen. IMPRESSION: Acute left femoral neck fracture appears to be subcapital. Electronically Signed   By: Inge Rise M.D.   On: 07/31/2017 15:28    Disposition: 01-Home or Self Care  Discharge Instructions    Diet - low sodium heart healthy   Complete by:  As directed    Diet Carb Modified   Complete by:  As directed    Increase activity slowly   Complete by:  As directed       Follow-up Information    [facility] Follow up.   Specialty:  [place] Why:  A representative from [place] will contact you to arrange resumption of your Home Health services. Contact information: [address] [place] [address] [phone]        Mcarthur Rossetti, MD. Schedule an appointment as soon as possible for a visit in 10 day(s).   Specialty:  Orthopedic Surgery Contact information: [place] [address] [phone]        Charlott Rakes, MD. Schedule an appointment as soon as possible for a visit in 1 week(s).   Specialty:  Family Medicine Contact information: [place] [address] [phone]            Signed: Mcarthur Rossetti 08/04/2017, 3:33 PM

## 2017-08-04 NOTE — Progress Notes (Signed)
Physical Therapy Treatment Patient Details Name: Melvin [name] MRN: [id number] DOB: 13-Sep-1960 Today's Date: 08/04/2017    History of Present Illness Mr. Alpert is a 57 y/o male admitted on 07/31/17 after a fall within the home with resultant L hip fracture. L THA (anterior) on 08/01/17. Patient with a PMH significant for poorly controlled DM, recent L distal ulnar osteomyelitis, pancreatic insufficiency and hx of pancreatitis, neuropathy, CHF.    PT Comments    Patient is making good progress with PT.  From a mobility standpoint anticipate patient will be ready for DC home when medically ready.   Follow Up Recommendations  Home health PT;Supervision for mobility/OOB     Equipment Recommendations  Rolling walker with 5" wheels    Recommendations for Other Services       Precautions / Restrictions Precautions Precautions: Fall Restrictions Weight Bearing Restrictions: Yes LLE Weight Bearing: Weight bearing as tolerated    Mobility  Bed Mobility Overal bed mobility: Modified Independent Bed Mobility: Supine to Sit           General bed mobility comments: increased time and effort  Transfers Overall transfer level: Needs assistance Equipment used: Rolling walker (2 wheeled) Transfers: Sit to/from Stand Sit to Stand: Supervision         General transfer comment: supervision for safety  Ambulation/Gait Ambulation/Gait assistance: Supervision Ambulation Distance (Feet): 300 Feet Assistive device: Rolling walker (2 wheeled) Gait Pattern/deviations: Step-through pattern;Decreased weight shift to left;Antalgic Gait velocity: decreased   General Gait Details: cues for posture; safe use of AD demonstrated   Stairs         General stair comments: verbally reviewed sequencing  Wheelchair Mobility    Modified Rankin (Stroke Patients Only)       Balance Overall balance assessment: Needs assistance Sitting-balance support: Feet supported Sitting  balance-[name]: Good     Standing balance support: Bilateral upper extremity supported;During functional activity Standing balance-[name]: Fair                              Cognition Arousal/Alertness: Awake/alert Behavior During Therapy: WFL for tasks assessed/performed Overall Cognitive Status: Within Functional Limits for tasks assessed                                        Exercises      General Comments General comments (skin integrity, edema, etc.): HEP frequency and use of ice reviewed      Pertinent Vitals/Pain Pain Assessment: Faces Faces Pain Scale: Hurts little more Pain Location: L hip Pain Descriptors / Indicators: Sore;Tightness Pain Intervention(s): Monitored during session;Premedicated before session;Limited activity within patient's tolerance;Repositioned;Ice applied    Home Living                      Prior Function            PT Goals (current goals can now be found in the care plan section) Acute Rehab PT Goals PT Goal Formulation: With patient/family Time For Goal Achievement: 08/08/17 Potential to Achieve Goals: Good Progress towards PT goals: Progressing toward goals    Frequency    7X/week      PT Plan Current plan remains appropriate    Co-evaluation              AM-PAC PT "6 Clicks" Daily Activity  Outcome Measure  Difficulty turning over in bed (including adjusting bedclothes, sheets and blankets)?: None Difficulty moving from lying on back to sitting on the side of the bed? : A Little Difficulty sitting down on and standing up from a chair with arms (e.g., wheelchair, bedside commode, etc,.)?: A Lot Help needed moving to and from a bed to chair (including a wheelchair)?: None Help needed walking in hospital room?: A Little Help needed climbing 3-5 steps with a railing? : A Little 6 Click Score: 19    End of Session Equipment Utilized During Treatment: Gait belt Activity  Tolerance: Patient tolerated treatment well Patient left: with call bell/phone within reach;in chair Nurse Communication: Mobility status PT Visit Diagnosis: Unsteadiness on feet (R26.81);Other abnormalities of gait and mobility (R26.89);Muscle weakness (generalized) (M62.81);History of falling (Z91.81);Difficulty in walking, not elsewhere classified (R26.2)     Time: 1438-8875 PT Time Calculation (min) (ACUTE ONLY): 26 min  Charges:  $Gait Training: 23-37 mins                    G Codes:       Earney Navy, PTA Pager: [phone]     Darliss Cheney 08/04/2017, 9:54 AM

## 2017-08-04 NOTE — Progress Notes (Signed)
Discharge teaching complete. Meds, diet, activity, follow up appointments reviewed and all questions answered. Lovenox teaching reviewed. Copy of instructions and prescriptions given to patient. Patient discharged home via wheelchair with family.

## 2017-08-05 LAB — BPAM RBC
Blood Product Expiration Date: 201903192359
Blood Product Expiration Date: 201903222359
ISSUE DATE / TIME: 201902251131
UNIT TYPE AND RH: 5100
UNIT TYPE AND RH: 5100

## 2017-08-05 LAB — TYPE AND SCREEN
ABO/RH(D): O POS
Antibody Screen: NEGATIVE
UNIT DIVISION: 0
Unit division: 0

## 2017-08-06 ENCOUNTER — Other Ambulatory Visit: Payer: Self-pay | Admitting: *Deleted

## 2017-08-06 ENCOUNTER — Ambulatory Visit: Payer: Self-pay | Admitting: *Deleted

## 2017-08-06 NOTE — Patient Outreach (Signed)
[place] [facility]) Care Management  08/06/2017  Melvin [name] 1961-01-13 [id number]   Member admitted to hospital over the weekend after being diagnosed with hip fracture as a result of a fall last week.  He had replacement on 2/23, discharged 2/26.  Call placed to confirm home visit for this morning.  He report he will not be available as he will be going to his sister's house today.  He state he is doing well, has been contacted by home health, PT will start tomorrow.  Attempted to complete transition of care assessment, but member state he has to leave.  Will follow up next week.  Will complete transition of care assessment and update care plan at that time.  [name], [place], MSN [place] [phone]

## 2017-08-07 DIAGNOSIS — E1165 Type 2 diabetes mellitus with hyperglycemia: Secondary | ICD-10-CM | POA: Diagnosis not present

## 2017-08-07 DIAGNOSIS — E11622 Type 2 diabetes mellitus with other skin ulcer: Secondary | ICD-10-CM | POA: Diagnosis not present

## 2017-08-07 DIAGNOSIS — L98492 Non-pressure chronic ulcer of skin of other sites with fat layer exposed: Secondary | ICD-10-CM | POA: Diagnosis not present

## 2017-08-07 DIAGNOSIS — I110 Hypertensive heart disease with heart failure: Secondary | ICD-10-CM | POA: Diagnosis not present

## 2017-08-07 DIAGNOSIS — E1140 Type 2 diabetes mellitus with diabetic neuropathy, unspecified: Secondary | ICD-10-CM | POA: Diagnosis not present

## 2017-08-07 DIAGNOSIS — L97222 Non-pressure chronic ulcer of left calf with fat layer exposed: Secondary | ICD-10-CM | POA: Diagnosis not present

## 2017-08-07 DIAGNOSIS — E1169 Type 2 diabetes mellitus with other specified complication: Secondary | ICD-10-CM | POA: Diagnosis not present

## 2017-08-07 DIAGNOSIS — M86132 Other acute osteomyelitis, left radius and ulna: Secondary | ICD-10-CM | POA: Diagnosis not present

## 2017-08-07 DIAGNOSIS — K861 Other chronic pancreatitis: Secondary | ICD-10-CM | POA: Diagnosis not present

## 2017-08-11 DIAGNOSIS — L97222 Non-pressure chronic ulcer of left calf with fat layer exposed: Secondary | ICD-10-CM | POA: Diagnosis not present

## 2017-08-11 DIAGNOSIS — I110 Hypertensive heart disease with heart failure: Secondary | ICD-10-CM | POA: Diagnosis not present

## 2017-08-11 DIAGNOSIS — E11622 Type 2 diabetes mellitus with other skin ulcer: Secondary | ICD-10-CM | POA: Diagnosis not present

## 2017-08-11 DIAGNOSIS — E1140 Type 2 diabetes mellitus with diabetic neuropathy, unspecified: Secondary | ICD-10-CM | POA: Diagnosis not present

## 2017-08-11 DIAGNOSIS — E1165 Type 2 diabetes mellitus with hyperglycemia: Secondary | ICD-10-CM | POA: Diagnosis not present

## 2017-08-11 DIAGNOSIS — E1169 Type 2 diabetes mellitus with other specified complication: Secondary | ICD-10-CM | POA: Diagnosis not present

## 2017-08-11 DIAGNOSIS — K861 Other chronic pancreatitis: Secondary | ICD-10-CM | POA: Diagnosis not present

## 2017-08-11 DIAGNOSIS — M86132 Other acute osteomyelitis, left radius and ulna: Secondary | ICD-10-CM | POA: Diagnosis not present

## 2017-08-11 DIAGNOSIS — L98492 Non-pressure chronic ulcer of skin of other sites with fat layer exposed: Secondary | ICD-10-CM | POA: Diagnosis not present

## 2017-08-14 ENCOUNTER — Other Ambulatory Visit: Payer: Self-pay | Admitting: *Deleted

## 2017-08-14 NOTE — Patient Outreach (Signed)
[place] [facility]) Care Management  08/14/2017  Melvin [name] Feb 25, 1961 [id number]   Weekly transition of care call placed to member. He report he is healing well, able to walk more with and sometimes without his walker.  State he continues to have home health for nursing, but no PT was ordered, state home health nurse will order today.  he has follow up appointment on 3/23 with Dr. Margarita Rana and on 3/21 with the orthopedic surgeon.    Report he has continued to check his blood sugar, report today was 220.  State he had chitterlings last night.  He verbalizes understanding of adhering to prescribed diet, however he also state there are times he will not adhere.    Denies any urgent concerns, agrees to home visit next week.  Advised to contact this care manager with questions.  THN CM Care Plan Problem One     Most Recent Value  Care Plan Problem One  Risk for readmission related to complications of hip replacement as evidence by recent admission   Role Documenting the Problem One  Care Management [place] for Problem One  Active  [facility] Long Term Goal   Member will not be readmitted to hospital within 31 days of discharge  [facility] Long Term Goal Start Date  08/14/17  Interventions for Problem One Long Term Goal  Discussed with member the importance of following discharge instructions, including follow up appointments, medications, diet, and home health involvement, to decrease the risk of readmission  THN CM Short Term Goal #1   Member will report adhering to diabetic diet as instructed over the next 4 weeks  THN CM Short Term Goal #1 Start Date  08/14/17  Interventions for Short Term Goal #1  Member educated on importance of adhering to diabetic diet in effort to promote healing and overall manage diabetes  THN CM Short Term Goal #2   Member will keep and attend follow up with primary MD and surgeon within the next 2 weeks  THN CM Short Term Goal #2 Start Date  08/14/17   Interventions for Short Term Goal #2  Advised member of importance of follow up with surgeon for hip and primary MD for overall management of chronic health conditions     [name], Therapist, sports, MSN [place] Manager [phone]

## 2017-08-18 ENCOUNTER — Telehealth: Payer: Self-pay | Admitting: Family Medicine

## 2017-08-18 NOTE — Telephone Encounter (Signed)
Angle from [facility] called requesting to extend the orders for 9 more weeks please call her back at [phone] she needs them by today Carilyn Goodpasture approved verbal orders.

## 2017-08-19 ENCOUNTER — Telehealth: Payer: Self-pay

## 2017-08-19 ENCOUNTER — Other Ambulatory Visit: Payer: Self-pay | Admitting: *Deleted

## 2017-08-19 NOTE — Telephone Encounter (Signed)
Call received from [facility], RN/THN. She was with the patient and was requesting a hospital follow up appointment for the patient . An appointment was scheduled for 08/31/17 @1000 . She was also inquiring about home health PT and noted that the patient has only received visits from the [facility] RN.  Call placed to [facility], spoke to [place], who stated that only RN services have been ordered. PT discharged the patient on 07/30/17.  This CM spoke to Dr Margarita Rana about home PT and she is deferring to the orthopedic surgeon.  Dr Ninfa Linden  [facility] Orthopedics # [phone].  Call placed to [facility]. Spoke to [place] and explained that Monica/THN had inquired about home PT. Informed Frances [place] that Dr Margarita Rana is deferring to orthopedics. Provided Shayna with the contact # for Dr Ninfa Linden.  Call placed to [facility],RN/THN and informed her that Dr Margarita Rana is deferring to orthopedics for home PT orders.

## 2017-08-19 NOTE — Patient Outreach (Signed)
[facility]) Care Management   08/19/2017  Melvin [name] Nov 11, 1960 [id number]  Melvin [name] is an 57 y.o. male  Subjective:   Member alert and oriented x3, denies pain or discomfort, does report some stiffness at times. Report compliance with medications.  Objective:   Review of Systems  Constitutional: Negative.   HENT: Negative.   Eyes: Negative.   Respiratory: Negative.   Cardiovascular: Negative.   Gastrointestinal: Negative.   Genitourinary: Negative.   Musculoskeletal: Negative.   Skin: Negative.   Neurological: Negative.   Endo/Heme/Allergies: Negative.   Psychiatric/Behavioral: Negative.     Physical Exam  Constitutional: He is oriented to person, place, and time. He appears well-developed and well-nourished.  Neck: Normal range of motion.  Cardiovascular: Regular rhythm and normal heart sounds.  Respiratory: Effort normal and breath sounds normal.  GI: Soft. Bowel sounds are normal.  Musculoskeletal: Normal range of motion.  Neurological: He is alert and oriented to person, place, and time.  Skin: Skin is warm and dry.   BP 128/72 (BP Location: Left Arm, Patient Position: Sitting, Cuff Size: Normal)   Pulse (!) 104   Resp 18   SpO2 98%   Encounter Medications:   Outpatient Encounter Medications as of 08/19/2017  Medication Sig Note  . ACCU-CHEK FASTCLIX LANCETS MISC TEST BLOOD GLUCOSE THREE TIMES DAILY AS DIRECTED   . ACCU-CHEK SMARTVIEW test strip USE AS DIRECTED TO TEST BLOOD GLUCOSE THREE TIMES DAILY   . ACCU-CHEK SOFTCLIX LANCETS lancets Use as instructed for 3 times daily testing of blood sugar. E11.9   . Alcohol Swabs (B-D SINGLE USE SWABS REGULAR) PADS USE AS DIRECTED THREE TIMES DAILY  AND AT BEDTIME   . aspirin 81 MG EC tablet Take 1 tablet (81 mg total) by mouth daily.   Marland Kitchen atorvastatin (LIPITOR) 10 MG tablet TAKE 1 TABLET ONE TIME DAILY  AT  6  P.M. (Patient taking differently: TAKE 1 TABLET (10 MG)  BY MOUTH DAILY AT  BEDTIME.)   . Blood Glucose Monitoring Suppl (ACCU-CHEK NANO SMARTVIEW) w/Device KIT Use as directed for 3 times daily testing of blood glucose.   . enoxaparin (LOVENOX) 40 MG/0.4ML injection Inject 0.4 mLs (40 mg total) into the skin daily for 21 days.   Marland Kitchen gabapentin (NEURONTIN) 300 MG capsule Take 2 capsules (600 mg total) by mouth 2 (two) times daily.   . insulin aspart (NOVOLOG) 100 UNIT/ML injection 150-200, give 3 units 201-250 give 5 units 251-300 give 7 units 301-350 give 9 units 351-400 give 12 units (Patient taking differently: Inject 2-5 Units into the skin 3 (three) times daily with meals. ) 08/01/2017: Pt could not articulate details of sliding scale  . insulin glargine (LANTUS) 100 UNIT/ML injection Inject 0.3 mLs (30 Units total) into the skin at bedtime.   . Insulin Syringe-Needle U-100 (B-D INS SYRINGE 0.5CC/31GX5/16) 31G X 5/16" 0.5 ML MISC USE TO INJECT INSULIN 3 TIMES A DAY AND AT BEDTIME   . Lancet Devices (ACCU-CHEK SOFTCLIX) lancets Use as instructed for 3 times daily testing of blood sugar. E11.9   . naproxen (NAPROSYN) 500 MG tablet TAKE 1 TABLET (500 MG TOTAL) BY MOUTH 2 (TWO) TIMES DAILY WITH A MEAL. (Patient taking differently: Take 500 mg by mouth 2 (two) times daily as needed (pain). )   . Pancrelipase, Lip-Prot-Amyl, (ZENPEP) 25000-79000 units CPEP Take 25,000 Units by mouth 3 (three) times daily with meals.   . senna (SENOKOT) 8.6 MG TABS tablet Take 1 tablet (8.6 mg total)  by mouth at bedtime as needed for mild constipation.   . bacitracin ointment Apply topically 2 (two) times daily. (Patient not taking: Reported on 08/19/2017)   . HYDROcodone-acetaminophen (NORCO/VICODIN) 5-325 MG tablet Take 1-2 tablets by mouth every 6 (six) hours as needed for moderate pain ((score 4 to 6)). (Patient not taking: Reported on 08/19/2017)    No facility-administered encounter medications on file as of 08/19/2017.     Functional Status:   In your present state of health, do you have  any difficulty performing the following activities: 07/09/2017 06/14/2017  Hearing? N N  Vision? Y N  Comment Reading  -  Difficulty concentrating or making decisions? N N  Walking or climbing stairs? Y Y  Dressing or bathing? N N  Doing errands, shopping? Y -  Comment daughter or sister provide transportation -  Conservation officer, nature and eating ? Y -  Comment Neuropathy -  Using the Toilet? N -  In the past six months, have you accidently leaked urine? N -  Do you have problems with loss of bowel control? Y -  Managing your Medications? N -  Managing your Finances? N -  Housekeeping or managing your Housekeeping? N -  Some recent data might be hidden    Fall/Depression Screening:    Fall Risk  07/21/2017 07/09/2017 02/13/2017  Falls in the past year? Yes Yes No  Number falls in past yr: - 2 or more -  Injury with Fall? No No -  Risk Factor Category  High Fall Risk High Fall Risk -  Risk for fall due to : - History of fall(s) -  Follow up - Education provided;Falls prevention discussed -   PHQ 2/9 Scores 07/21/2017 07/09/2017 07/01/2017 06/10/2017 02/13/2017 11/11/2016 07/18/2016  PHQ - 2 Score 0 0 0 0 0 0 0  PHQ- 9 Score - - 0 0 0 1 2    Assessment:    Met with member at scheduled time.  Denies any recent falls, uses walker for stability.  Report surgeon will remove staples next week.  Left are healed, scabbed but report he has some draining at time from the wrist.  Wrist area looks swollen, hard to touch, but not warm.  He denies having follow up with ID specialist, but will have primary MD assess during next visit.  He admits that he has not been 100% compliant with diabetes management, with diet (had burger, hot dog, and beans from [employer] for dinner last night) and insulin adherence.  Complications of poor control discussed, he report he will attempt to "do better."  Denies any urgent concerns, advised to contact this care manager with questions.  Plan:   Will follow up next week with  weekly transition of care call.  [facility] CM Care Plan Problem One     Most Recent Value  Care Plan Problem One  Risk for readmission related to complications of hip replacement as evidence by recent admission   Role Documenting the Problem One  Care Management [place] for Problem One  Active  [facility] Long Term Goal   Member will not be readmitted to hospital within 31 days of discharge  [facility] Long Term Goal Start Date  08/14/17  Interventions for Problem One Long Term Goal  Member educated on importance of increased mobility to promote healing.  Call placed to MD office to request order be placed for home health PT.  [facility] CM Short Term Goal #1   Member will report adhering to  diabetic diet as instructed over the next 4 weeks  [facility] CM Short Term Goal #1 Start Date  08/14/17  Interventions for Short Term Goal #1  Re-educated on appropriate diabetic diet.  Provided with examples of healthy food choices in effort to better manage daily blood sugars and overall A1C   [facility] CM Short Term Goal #2   Member will keep and attend follow up with primary MD and surgeon within the next 2 weeks  [facility] CM Short Term Goal #2 Start Date  08/14/17  Interventions for Short Term Goal #2  Reminded of follow up appointment with surgeon on 3/21.  Call placed to primary MD office to schedule post hospital visit.     [name], [place], MSN [place] [phone]

## 2017-08-20 DIAGNOSIS — E1140 Type 2 diabetes mellitus with diabetic neuropathy, unspecified: Secondary | ICD-10-CM | POA: Diagnosis not present

## 2017-08-20 DIAGNOSIS — E1165 Type 2 diabetes mellitus with hyperglycemia: Secondary | ICD-10-CM | POA: Diagnosis not present

## 2017-08-20 DIAGNOSIS — L97222 Non-pressure chronic ulcer of left calf with fat layer exposed: Secondary | ICD-10-CM | POA: Diagnosis not present

## 2017-08-20 DIAGNOSIS — E11622 Type 2 diabetes mellitus with other skin ulcer: Secondary | ICD-10-CM | POA: Diagnosis not present

## 2017-08-20 DIAGNOSIS — K861 Other chronic pancreatitis: Secondary | ICD-10-CM | POA: Diagnosis not present

## 2017-08-20 DIAGNOSIS — I509 Heart failure, unspecified: Secondary | ICD-10-CM | POA: Diagnosis not present

## 2017-08-20 DIAGNOSIS — S72002D Fracture of unspecified part of neck of left femur, subsequent encounter for closed fracture with routine healing: Secondary | ICD-10-CM | POA: Diagnosis not present

## 2017-08-20 DIAGNOSIS — I110 Hypertensive heart disease with heart failure: Secondary | ICD-10-CM | POA: Diagnosis not present

## 2017-08-20 DIAGNOSIS — D649 Anemia, unspecified: Secondary | ICD-10-CM | POA: Diagnosis not present

## 2017-08-21 ENCOUNTER — Telehealth: Payer: Self-pay | Admitting: Family Medicine

## 2017-08-21 NOTE — Telephone Encounter (Signed)
Received fax from [place] care for order, fax will on the pcp in-box

## 2017-08-24 ENCOUNTER — Telehealth: Payer: Self-pay | Admitting: Family Medicine

## 2017-08-24 NOTE — Telephone Encounter (Signed)
Angel from [place] called requesting orders for PT for eval. Please fu at your earliest convenience.

## 2017-08-24 NOTE — Telephone Encounter (Signed)
Will forward to nurse 

## 2017-08-25 DIAGNOSIS — K861 Other chronic pancreatitis: Secondary | ICD-10-CM | POA: Diagnosis not present

## 2017-08-25 DIAGNOSIS — E1165 Type 2 diabetes mellitus with hyperglycemia: Secondary | ICD-10-CM | POA: Diagnosis not present

## 2017-08-25 DIAGNOSIS — E1140 Type 2 diabetes mellitus with diabetic neuropathy, unspecified: Secondary | ICD-10-CM | POA: Diagnosis not present

## 2017-08-25 DIAGNOSIS — S72002D Fracture of unspecified part of neck of left femur, subsequent encounter for closed fracture with routine healing: Secondary | ICD-10-CM | POA: Diagnosis not present

## 2017-08-25 DIAGNOSIS — L97222 Non-pressure chronic ulcer of left calf with fat layer exposed: Secondary | ICD-10-CM | POA: Diagnosis not present

## 2017-08-25 DIAGNOSIS — D649 Anemia, unspecified: Secondary | ICD-10-CM | POA: Diagnosis not present

## 2017-08-25 DIAGNOSIS — E11622 Type 2 diabetes mellitus with other skin ulcer: Secondary | ICD-10-CM | POA: Diagnosis not present

## 2017-08-25 DIAGNOSIS — I110 Hypertensive heart disease with heart failure: Secondary | ICD-10-CM | POA: Diagnosis not present

## 2017-08-25 DIAGNOSIS — I509 Heart failure, unspecified: Secondary | ICD-10-CM | POA: Diagnosis not present

## 2017-08-26 ENCOUNTER — Telehealth (INDEPENDENT_AMBULATORY_CARE_PROVIDER_SITE_OTHER): Payer: Self-pay

## 2017-08-26 NOTE — Telephone Encounter (Signed)
Gave verbal orders to [facility] with [facility] for 1 time PT Home Health evaluation per Renato Gails.  Cb# for Glenard Haring is [phone]

## 2017-08-27 ENCOUNTER — Telehealth (INDEPENDENT_AMBULATORY_CARE_PROVIDER_SITE_OTHER): Payer: Self-pay | Admitting: Orthopaedic Surgery

## 2017-08-27 DIAGNOSIS — K861 Other chronic pancreatitis: Secondary | ICD-10-CM | POA: Diagnosis not present

## 2017-08-27 DIAGNOSIS — D649 Anemia, unspecified: Secondary | ICD-10-CM | POA: Diagnosis not present

## 2017-08-27 DIAGNOSIS — L97222 Non-pressure chronic ulcer of left calf with fat layer exposed: Secondary | ICD-10-CM | POA: Diagnosis not present

## 2017-08-27 DIAGNOSIS — I509 Heart failure, unspecified: Secondary | ICD-10-CM | POA: Diagnosis not present

## 2017-08-27 DIAGNOSIS — E1140 Type 2 diabetes mellitus with diabetic neuropathy, unspecified: Secondary | ICD-10-CM | POA: Diagnosis not present

## 2017-08-27 DIAGNOSIS — I110 Hypertensive heart disease with heart failure: Secondary | ICD-10-CM | POA: Diagnosis not present

## 2017-08-27 DIAGNOSIS — S72002D Fracture of unspecified part of neck of left femur, subsequent encounter for closed fracture with routine healing: Secondary | ICD-10-CM | POA: Diagnosis not present

## 2017-08-27 DIAGNOSIS — E1165 Type 2 diabetes mellitus with hyperglycemia: Secondary | ICD-10-CM | POA: Diagnosis not present

## 2017-08-27 DIAGNOSIS — E11622 Type 2 diabetes mellitus with other skin ulcer: Secondary | ICD-10-CM | POA: Diagnosis not present

## 2017-08-27 NOTE — Telephone Encounter (Signed)
Faxed order to remove staples to [phone] per Glenard Haring

## 2017-08-27 NOTE — Telephone Encounter (Signed)
Angel from [facility] called asking for wound care orders for the patient and also wanted him to get blood work done soon. CB # [phone]

## 2017-08-28 ENCOUNTER — Other Ambulatory Visit: Payer: Self-pay | Admitting: *Deleted

## 2017-08-28 DIAGNOSIS — K861 Other chronic pancreatitis: Secondary | ICD-10-CM | POA: Diagnosis not present

## 2017-08-28 DIAGNOSIS — D649 Anemia, unspecified: Secondary | ICD-10-CM | POA: Diagnosis not present

## 2017-08-28 DIAGNOSIS — I509 Heart failure, unspecified: Secondary | ICD-10-CM | POA: Diagnosis not present

## 2017-08-28 DIAGNOSIS — E11622 Type 2 diabetes mellitus with other skin ulcer: Secondary | ICD-10-CM | POA: Diagnosis not present

## 2017-08-28 DIAGNOSIS — S72002D Fracture of unspecified part of neck of left femur, subsequent encounter for closed fracture with routine healing: Secondary | ICD-10-CM | POA: Diagnosis not present

## 2017-08-28 DIAGNOSIS — I110 Hypertensive heart disease with heart failure: Secondary | ICD-10-CM | POA: Diagnosis not present

## 2017-08-28 DIAGNOSIS — E1140 Type 2 diabetes mellitus with diabetic neuropathy, unspecified: Secondary | ICD-10-CM | POA: Diagnosis not present

## 2017-08-28 DIAGNOSIS — E1165 Type 2 diabetes mellitus with hyperglycemia: Secondary | ICD-10-CM | POA: Diagnosis not present

## 2017-08-28 DIAGNOSIS — L97222 Non-pressure chronic ulcer of left calf with fat layer exposed: Secondary | ICD-10-CM | POA: Diagnosis not present

## 2017-08-28 NOTE — Patient Outreach (Signed)
[place]) Care Management  08/28/2017  Melvin [name] September 28, 1960 [id number]   Weekly transition of care call placed to member.  He report he continues to progress well, denies signs/symptoms of infection.  Denies pain or discomfort.  He report there was miscommunication with his last appointment with the surgeon, but appointment rescheduled for next week.  Home health nurse will make home visit to remove sutures in his hip.  He has not checked blood sugar today as he is just waking up, report he has been experiencing upper respiratory, will discuss with MD on Monday.  Blood sugar yesterday was in the 160s, state he has been doing better over the past week with his diet and insulin management, will continue to work on adherence.  Denies any questions/concerns at this time.   [facility] CM Care Plan Problem One     Most Recent Value  Care Plan Problem One  Risk for readmission related to complications of hip replacement as evidence by recent admission   Role Documenting the Problem One  Care Management [place] for Problem One  Active  [facility] Long Term Goal   Member will not be readmitted to hospital within 31 days of discharge  [facility] Long Term Goal Start Date  08/14/17  Interventions for Problem One Long Term Goal  Confirmed with member that home health PT has contacted him and will begin services.  Advised of importance of compliance with therapy, including home exercises, in order to stay active and decrease risk of complication  [facility] CM Short Term Goal #1   Member will report adhering to diabetic diet as instructed over the next 4 weeks  [facility] CM Short Term Goal #1 Start Date  08/14/17  [facility] CM Short Term Goal #1 Met Date  08/28/17  [facility] CM Short Term Goal #2   Member will keep and attend follow up with primary MD and surgeon within the next 2 weeks  [facility]N CM Short Term Goal #2 Start Date  08/14/17  Interventions for Short Term Goal #2  Confirmed with member that he has  transportation to MD appointment on Monday, 3/25 and Wednesday 3/27.  Daughter will provide transportation     [name], Therapist, sports, MSN [place] [phone]

## 2017-08-31 ENCOUNTER — Telehealth: Payer: Self-pay | Admitting: Family Medicine

## 2017-08-31 ENCOUNTER — Encounter: Payer: Self-pay | Admitting: Family Medicine

## 2017-08-31 ENCOUNTER — Ambulatory Visit: Payer: Medicare HMO | Attending: Family Medicine | Admitting: Family Medicine

## 2017-08-31 VITALS — BP 130/85 | HR 101 | Temp 97.5°F | Ht 68.0 in | Wt 162.2 lb

## 2017-08-31 DIAGNOSIS — I110 Hypertensive heart disease with heart failure: Secondary | ICD-10-CM | POA: Diagnosis not present

## 2017-08-31 DIAGNOSIS — M86132 Other acute osteomyelitis, left radius and ulna: Secondary | ICD-10-CM | POA: Diagnosis not present

## 2017-08-31 DIAGNOSIS — X58XXXA Exposure to other specified factors, initial encounter: Secondary | ICD-10-CM | POA: Insufficient documentation

## 2017-08-31 DIAGNOSIS — I509 Heart failure, unspecified: Secondary | ICD-10-CM | POA: Insufficient documentation

## 2017-08-31 DIAGNOSIS — E1169 Type 2 diabetes mellitus with other specified complication: Secondary | ICD-10-CM

## 2017-08-31 DIAGNOSIS — Z7982 Long term (current) use of aspirin: Secondary | ICD-10-CM | POA: Insufficient documentation

## 2017-08-31 DIAGNOSIS — S72002A Fracture of unspecified part of neck of left femur, initial encounter for closed fracture: Secondary | ICD-10-CM

## 2017-08-31 DIAGNOSIS — K860 Alcohol-induced chronic pancreatitis: Secondary | ICD-10-CM | POA: Insufficient documentation

## 2017-08-31 DIAGNOSIS — E11622 Type 2 diabetes mellitus with other skin ulcer: Secondary | ICD-10-CM | POA: Diagnosis not present

## 2017-08-31 DIAGNOSIS — E1149 Type 2 diabetes mellitus with other diabetic neurological complication: Secondary | ICD-10-CM

## 2017-08-31 DIAGNOSIS — K219 Gastro-esophageal reflux disease without esophagitis: Secondary | ICD-10-CM | POA: Diagnosis not present

## 2017-08-31 DIAGNOSIS — E11649 Type 2 diabetes mellitus with hypoglycemia without coma: Secondary | ICD-10-CM | POA: Insufficient documentation

## 2017-08-31 DIAGNOSIS — Z96642 Presence of left artificial hip joint: Secondary | ICD-10-CM | POA: Insufficient documentation

## 2017-08-31 DIAGNOSIS — S72002D Fracture of unspecified part of neck of left femur, subsequent encounter for closed fracture with routine healing: Secondary | ICD-10-CM | POA: Diagnosis not present

## 2017-08-31 DIAGNOSIS — Z79899 Other long term (current) drug therapy: Secondary | ICD-10-CM | POA: Insufficient documentation

## 2017-08-31 DIAGNOSIS — M8610 Other acute osteomyelitis, unspecified site: Secondary | ICD-10-CM | POA: Diagnosis not present

## 2017-08-31 DIAGNOSIS — E119 Type 2 diabetes mellitus without complications: Secondary | ICD-10-CM | POA: Diagnosis not present

## 2017-08-31 DIAGNOSIS — E1140 Type 2 diabetes mellitus with diabetic neuropathy, unspecified: Secondary | ICD-10-CM | POA: Insufficient documentation

## 2017-08-31 DIAGNOSIS — E785 Hyperlipidemia, unspecified: Secondary | ICD-10-CM | POA: Diagnosis not present

## 2017-08-31 DIAGNOSIS — L97929 Non-pressure chronic ulcer of unspecified part of left lower leg with unspecified severity: Secondary | ICD-10-CM | POA: Diagnosis not present

## 2017-08-31 DIAGNOSIS — R21 Rash and other nonspecific skin eruption: Secondary | ICD-10-CM | POA: Diagnosis not present

## 2017-08-31 DIAGNOSIS — L97919 Non-pressure chronic ulcer of unspecified part of right lower leg with unspecified severity: Secondary | ICD-10-CM | POA: Diagnosis not present

## 2017-08-31 DIAGNOSIS — K861 Other chronic pancreatitis: Secondary | ICD-10-CM | POA: Diagnosis not present

## 2017-08-31 DIAGNOSIS — Z794 Long term (current) use of insulin: Secondary | ICD-10-CM | POA: Diagnosis not present

## 2017-08-31 DIAGNOSIS — M9702XA Periprosthetic fracture around internal prosthetic left hip joint, initial encounter: Secondary | ICD-10-CM | POA: Insufficient documentation

## 2017-08-31 DIAGNOSIS — L97222 Non-pressure chronic ulcer of left calf with fat layer exposed: Secondary | ICD-10-CM | POA: Diagnosis not present

## 2017-08-31 DIAGNOSIS — E1165 Type 2 diabetes mellitus with hyperglycemia: Secondary | ICD-10-CM | POA: Diagnosis not present

## 2017-08-31 DIAGNOSIS — D649 Anemia, unspecified: Secondary | ICD-10-CM | POA: Diagnosis not present

## 2017-08-31 LAB — GLUCOSE, POCT (MANUAL RESULT ENTRY): POC GLUCOSE: 129 mg/dL — AB (ref 70–99)

## 2017-08-31 MED ORDER — CEPHALEXIN 500 MG PO CAPS: 500.0000 mg | ORAL_CAPSULE | Freq: Two times a day (BID) | ORAL | 0 refills | Status: DC

## 2017-08-31 MED ORDER — INSULIN GLARGINE 100 UNIT/ML ~~LOC~~ SOLN: 30.0000 [IU] | Freq: Every day | SUBCUTANEOUS | 3 refills | Status: DC

## 2017-08-31 MED ORDER — GABAPENTIN 300 MG PO CAPS: 600.0000 mg | ORAL_CAPSULE | Freq: Two times a day (BID) | ORAL | 3 refills | Status: DC

## 2017-08-31 MED ORDER — ATORVASTATIN CALCIUM 10 MG PO TABS: ORAL_TABLET | ORAL | 1 refills | Status: AC

## 2017-08-31 MED ORDER — CEPHALEXIN 500 MG PO CAPS
500.0000 mg | ORAL_CAPSULE | Freq: Two times a day (BID) | ORAL | 0 refills | Status: DC
Start: 2017-08-31 — End: 2017-08-31

## 2017-08-31 NOTE — Progress Notes (Signed)
Subjective:  Patient ID: Melvin [name], male    DOB: 1960/06/30  Age: 57 y.o. MRN: [id number]  CC: Hospitalization Follow-up   HPI Melvin [name] is a 57 year old male with history of type 2 diabetes mellitus (A1c 9.0), diabetic neuropathy, chronic alcoholic pancreatitis, hyperlipidemia, hypertension, hospitalization for Osteomyelitis of the left ulnar in 06/2017, recent left total hip replacement secondary to left base of neck femur fracture secondary to a fall. He underwent replacement on 08/01/17 by Dr. Ninfa Linden and reports doing well and is ambulating with a walker; scheduled to begin physical therapy today with a follow-up with his surgeon in 2 days  He complains of drainage from his left wrist which he noticed recently but denies fever.  Dr. Grandville Silos had performed his irrigation and debridement of his left ulna and he was released after completion of antibiotics.  With regards to his diabetes mellitus his lowest blood sugar has been 75 so far but he endorses a blood sugar of 50 which he states he had prior to his fall last month.  Endorses late night snacking.  Continues to suffer from diabetic neuropathy in his hands and lower extremities bilaterally.  He has a rash on his right shin which started out as a hyperpigmentation but then he noticed it to be bleeding this morning and is unable to recall a history of trauma.  Past Medical History:  Diagnosis Date  . Anemia   . Angina   . CHF (congestive heart failure) ([place])   . Diabetes mellitus   . GERD (gastroesophageal reflux disease)   . Hyperlipidemia   . Meningitis   . Neuromuscular disorder ([place])    diabetic neuropathy  . Pancreatitis   . Pneumonia     Past Surgical History:  Procedure Laterality Date  . ERCP  08/02/2011   Procedure: ENDOSCOPIC RETROGRADE CHOLANGIOPANCREATOGRAPHY (ERCP);  Surgeon: Beryle Beams, MD;  Location: [facility] ENDOSCOPY;  Service: Endoscopy;  Laterality: N/A;  . ERCP  08/22/2011   Procedure:  ENDOSCOPIC RETROGRADE CHOLANGIOPANCREATOGRAPHY (ERCP);  Surgeon: Beryle Beams, MD;  Location: [facility] ENDOSCOPY;  Service: Endoscopy;  Laterality: N/A;  . I&D EXTREMITY Left 06/10/2017   Procedure: IRRIGATION AND DEBRIDEMENT EXTREMITY, left wrist;  Surgeon: Milly Jakob, MD;  Location: [place];  Service: Orthopedics;  Laterality: Left;  . TONSILLECTOMY AND ADENOIDECTOMY     "as a kid"  . TOTAL HIP ARTHROPLASTY Left 08/01/2017   Procedure: LEFT TOTAL HIP REPLACEMENT ANTERIOR APPROACH;  Surgeon: Mcarthur Rossetti, MD;  Location: [place];  Service: Orthopedics;  Laterality: Left;  . TYMPANOSTOMY TUBE PLACEMENT      No Known Allergies    Outpatient Medications Prior to Visit  Medication Sig Dispense Refill  . ACCU-CHEK FASTCLIX LANCETS MISC TEST BLOOD GLUCOSE THREE TIMES DAILY AS DIRECTED 306 each 12  . ACCU-CHEK SMARTVIEW test strip USE AS DIRECTED TO TEST BLOOD GLUCOSE THREE TIMES DAILY 300 each 12  . ACCU-CHEK SOFTCLIX LANCETS lancets Use as instructed for 3 times daily testing of blood sugar. E11.9 100 each 12  . Alcohol Swabs (B-D SINGLE USE SWABS REGULAR) PADS USE AS DIRECTED THREE TIMES DAILY  AND AT BEDTIME 400 each 3  . aspirin 81 MG EC tablet Take 1 tablet (81 mg total) by mouth daily. 30 tablet 3  . bacitracin ointment Apply topically 2 (two) times daily. 120 g 2  . Blood Glucose Monitoring Suppl (ACCU-CHEK NANO SMARTVIEW) w/Device KIT Use as directed for 3 times daily testing of blood glucose. 1 kit 0  .  insulin aspart (NOVOLOG) 100 UNIT/ML injection 150-200, give 3 units 201-250 give 5 units 251-300 give 7 units 301-350 give 9 units 351-400 give 12 units (Patient taking differently: Inject 2-5 Units into the skin 3 (three) times daily with meals. ) 3 vial 3  . Insulin Syringe-Needle U-100 (B-D INS SYRINGE 0.5CC/31GX5/16) 31G X 5/16" 0.5 ML MISC USE TO INJECT INSULIN 3 TIMES A DAY AND AT BEDTIME 360 each 3  . Lancet Devices (ACCU-CHEK SOFTCLIX) lancets Use as instructed for 3 times  daily testing of blood sugar. E11.9 1 each 0  . naproxen (NAPROSYN) 500 MG tablet TAKE 1 TABLET (500 MG TOTAL) BY MOUTH 2 (TWO) TIMES DAILY WITH A MEAL. (Patient taking differently: Take 500 mg by mouth 2 (two) times daily as needed (pain). ) 60 tablet 0  . Pancrelipase, Lip-Prot-Amyl, (ZENPEP) 25000-79000 units CPEP Take 25,000 Units by mouth 3 (three) times daily with meals.    . senna (SENOKOT) 8.6 MG TABS tablet Take 1 tablet (8.6 mg total) by mouth at bedtime as needed for mild constipation. 10 each 0  . atorvastatin (LIPITOR) 10 MG tablet TAKE 1 TABLET ONE TIME DAILY  AT  6  P.M. (Patient taking differently: TAKE 1 TABLET (10 MG)  BY MOUTH DAILY AT BEDTIME.) 90 tablet 0  . gabapentin (NEURONTIN) 300 MG capsule Take 2 capsules (600 mg total) by mouth 2 (two) times daily. 120 capsule 3  . insulin glargine (LANTUS) 100 UNIT/ML injection Inject 0.3 mLs (30 Units total) into the skin at bedtime. 10 mL 3  . HYDROcodone-acetaminophen (NORCO/VICODIN) 5-325 MG tablet Take 1-2 tablets by mouth every 6 (six) hours as needed for moderate pain ((score 4 to 6)). (Patient not taking: Reported on 08/19/2017) 30 tablet 0  . enoxaparin (LOVENOX) 40 MG/0.4ML injection Inject 0.4 mLs (40 mg total) into the skin daily for 21 days. 8.4 mL 0   No facility-administered medications prior to visit.     ROS Review of Systems  Constitutional: Negative for activity change and appetite change.  HENT: Negative for sinus pressure and sore throat.   Eyes: Negative for visual disturbance.  Respiratory: Negative for cough, chest tightness and shortness of breath.   Cardiovascular: Negative for chest pain and leg swelling.  Gastrointestinal: Negative for abdominal distention, abdominal pain, constipation and diarrhea.  Endocrine: Negative.   Genitourinary: Negative for dysuria.  Musculoskeletal:       See hpi  Skin: Positive for rash.  Allergic/Immunologic: Negative.   Neurological: Positive for numbness. Negative for  weakness and light-headedness.  Psychiatric/Behavioral: Negative for dysphoric mood and suicidal ideas.    Objective:  BP 130/85   Pulse (!) 101   Temp (!) 97.5 F (36.4 C) (Oral)   Ht _0  (1.727 m)   Wt 162 lb 3.2 oz (73.6 kg)   SpO2 100%   BMI 24.66 kg/m   BP/Weight 08/31/2017 08/19/2017 3/53/6144  Systolic BP 315 400 867  Diastolic BP 85 72 89  Wt. (Lbs) 162.2 - -  BMI 24.66 - -      Physical Exam  Constitutional: He is oriented to person, place, and time. He appears well-developed and well-nourished.  Cardiovascular: Normal rate, normal heart sounds and intact distal pulses.  No murmur heard. Pulmonary/Chest: Effort normal and breath sounds normal. He has no wheezes. He has no rales. He exhibits no tenderness.  Abdominal: Soft. Bowel sounds are normal. He exhibits no distension and no mass. There is no tenderness.  Musculoskeletal: He exhibits edema (left wrist edema  with serous drainage at site of surgical incision from ulnar aspect, no TTP).  Neurological: He is alert and oriented to person, place, and time.  Skin: Skin is warm and dry.  Psychiatric: He has a normal mood and affect.     CMP Latest Ref Rng & Units 08/02/2017 08/01/2017 07/31/2017  Glucose 65 - 99 mg/dL 296(H) 109(H) 391(H)  BUN 6 - 20 mg/dL _0 Creatinine 0.61 - 1.24 mg/dL 1.52(H) 1.11 1.40(H)  Sodium 135 - 145 mmol/L 134(L) 136 137  Potassium 3.5 - 5.1 mmol/L 4.5 3.6 4.2  Chloride 101 - 111 mmol/L 108 111 112(H)  CO2 22 - 32 mmol/L 20(L) 20(L) 18(L)  Calcium 8.9 - 10.3 mg/dL 8.5(L) 8.6(L) 9.0  Total Protein 6.5 - 8.1 g/dL - - -  Total Bilirubin 0.3 - 1.2 mg/dL - - -  Alkaline Phos 38 - 126 U/L - - -  AST 15 - 41 U/L - - -  ALT 17 - 63 U/L - - -     Lipid Panel     Component Value Date/Time   CHOL 138 11/14/2016 0842   TRIG 58 11/14/2016 0842   HDL 104 11/14/2016 0842   CHOLHDL 1.3 11/14/2016 0842   CHOLHDL 1.7 10/25/2014 0250   VLDL 13 10/25/2014 0250   LDLCALC 22 11/14/2016  0842    Lab Results  Component Value Date   HGBA1C 9.0 (H) 06/10/2017    Assessment & Plan:   1. Type 2 diabetes mellitus with other specified complication, with long-term current use of insulin (HCC) Uncontrolled with A1c of 9.0 Will hold off on increasing dose of insulin due to few episodes of hypoglycemia Advised against late night snacking Continue diabetic diet - POCT glucose (manual entry)  2. Hyperlipidemia, unspecified hyperlipidemia type Controlled Low-cholesterol diet - atorvastatin (LIPITOR) 10 MG tablet; TAKE 1 TABLET (10 MG)  BY MOUTH DAILY AT BEDTIME.  Dispense: 90 tablet; Refill: 1  3. Type 2 diabetes mellitus with other neurologic complication, with long-term current use of insulin (HCC) Neuropathy is uncontrolled We will consider switching to Lyrica at next visit - gabapentin (NEURONTIN) 300 MG capsule; Take 2 capsules (600 mg total) by mouth 2 (two) times daily.  Dispense: 120 capsule; Refill: 3 - insulin glargine (LANTUS) 100 UNIT/ML injection; Inject 0.3 mLs (30 Units total) into the skin at bedtime.  Dispense: 10 mL; Refill: 3  4. Other acute osteomyelitis of left ulna (HCC) Advised to schedule an appointment with Dr. Grandville Silos - cephALEXin (KEFLEX) 500 MG capsule; Take 1 capsule (500 mg total) by mouth 2 (two) times daily.  Dispense: 20 capsule; Refill: 0  5. Closed fracture of left hip, initial encounter ([place]) Stable Commence physical therapy as scheduled Keep follow-up appointment with Dr. Ninfa Linden  6. Diabetic ulcer of left lower leg (HCC) Likely due to trauma Dressing change performed in the clinic and emphasized the need for caution   Meds ordered this encounter  Medications  . atorvastatin (LIPITOR) 10 MG tablet    Sig: TAKE 1 TABLET (10 MG)  BY MOUTH DAILY AT BEDTIME.    Dispense:  90 tablet    Refill:  1  . gabapentin (NEURONTIN) 300 MG capsule    Sig: Take 2 capsules (600 mg total) by mouth 2 (two) times daily.    Dispense:  120 capsule      Refill:  3  . insulin glargine (LANTUS) 100 UNIT/ML injection    Sig: Inject 0.3 mLs (30 Units total) into the skin  at bedtime.    Dispense:  10 mL    Refill:  3    Discontinue previous dose  . DISCONTD: cephALEXin (KEFLEX) 500 MG capsule    Sig: Take 1 capsule (500 mg total) by mouth 2 (two) times daily.    Dispense:  20 capsule    Refill:  0  . cephALEXin (KEFLEX) 500 MG capsule    Sig: Take 1 capsule (500 mg total) by mouth 2 (two) times daily.    Dispense:  20 capsule    Refill:  0    Follow-up: Return in about 1 month (around 10/01/2017) for follow up of right leg ulcer.   Charlott Rakes MD

## 2017-08-31 NOTE — Patient Instructions (Signed)
Diabetes Mellitus and Nutrition When you have diabetes (diabetes mellitus), it is very important to have healthy eating habits because your blood sugar (glucose) levels are greatly affected by what you eat and drink. Eating healthy foods in the appropriate amounts, at about the same times every day, can help you:  Control your blood glucose.  Lower your risk of heart disease.  Improve your blood pressure.  Reach or maintain a healthy weight.  Every person with diabetes is different, and each person has different needs for a meal plan. Your health care provider may recommend that you work with a diet and nutrition specialist (dietitian) to make a meal plan that is best for you. Your meal plan may vary depending on factors such as:  The calories you need.  The medicines you take.  Your weight.  Your blood glucose, blood pressure, and cholesterol levels.  Your activity level.  Other health conditions you have, such as heart or kidney disease.  How do carbohydrates affect me? Carbohydrates affect your blood glucose level more than any other type of food. Eating carbohydrates naturally increases the amount of glucose in your blood. Carbohydrate counting is a method for keeping track of how many carbohydrates you eat. Counting carbohydrates is important to keep your blood glucose at a healthy level, especially if you use insulin or take certain oral diabetes medicines. It is important to know how many carbohydrates you can safely have in each meal. This is different for every person. Your dietitian can help you calculate how many carbohydrates you should have at each meal and for snack. Foods that contain carbohydrates include:  Bread, cereal, rice, pasta, and crackers.  Potatoes and corn.  Peas, beans, and lentils.  Milk and yogurt.  Fruit and juice.  Desserts, such as cakes, cookies, ice cream, and candy.  How does alcohol affect me? Alcohol can cause a sudden decrease in blood  glucose (hypoglycemia), especially if you use insulin or take certain oral diabetes medicines. Hypoglycemia can be a life-threatening condition. Symptoms of hypoglycemia (sleepiness, dizziness, and confusion) are similar to symptoms of having too much alcohol. If your health care provider says that alcohol is safe for you, follow these guidelines:  Limit alcohol intake to no more than 1 drink per day for nonpregnant women and 2 drinks per day for men. One drink equals 12 oz of beer, 5 oz of wine, or 1 oz of hard liquor.  Do not drink on an empty stomach.  Keep yourself hydrated with water, diet soda, or unsweetened iced tea.  Keep in mind that regular soda, juice, and other mixers may contain a lot of sugar and must be counted as carbohydrates.  What are tips for following this plan? Reading food labels  Start by checking the serving size on the label. The amount of calories, carbohydrates, fats, and other nutrients listed on the label are based on one serving of the food. Many foods contain more than one serving per package.  Check the total grams (g) of carbohydrates in one serving. You can calculate the number of servings of carbohydrates in one serving by dividing the total carbohydrates by 15. For example, if a food has 30 g of total carbohydrates, it would be equal to 2 servings of carbohydrates.  Check the number of grams (g) of saturated and trans fats in one serving. Choose foods that have low or no amount of these fats.  Check the number of milligrams (mg) of sodium in one serving. Most people   should limit total sodium intake to less than 2,300 mg per day.  Always check the nutrition information of foods labeled as "low-fat" or "nonfat". These foods may be higher in added sugar or refined carbohydrates and should be avoided.  Talk to your dietitian to identify your daily goals for nutrients listed on the label. Shopping  Avoid buying canned, premade, or processed foods. These  foods tend to be high in fat, sodium, and added sugar.  Shop around the outside edge of the grocery store. This includes fresh fruits and vegetables, bulk grains, fresh meats, and fresh dairy. Cooking  Use low-heat cooking methods, such as baking, instead of high-heat cooking methods like deep frying.  Cook using healthy oils, such as olive, canola, or sunflower oil.  Avoid cooking with butter, cream, or high-fat meats. Meal planning  Eat meals and snacks regularly, preferably at the same times every day. Avoid going long periods of time without eating.  Eat foods high in fiber, such as fresh fruits, vegetables, beans, and whole grains. Talk to your dietitian about how many servings of carbohydrates you can eat at each meal.  Eat 4-6 ounces of lean protein each day, such as lean meat, chicken, fish, eggs, or tofu. 1 ounce is equal to 1 ounce of meat, chicken, or fish, 1 egg, or 1/4 cup of tofu.  Eat some foods each day that contain healthy fats, such as avocado, nuts, seeds, and fish. Lifestyle   Check your blood glucose regularly.  Exercise at least 30 minutes 5 or more days each week, or as told by your health care provider.  Take medicines as told by your health care provider.  Do not use any products that contain nicotine or tobacco, such as cigarettes and e-cigarettes. If you need help quitting, ask your health care provider.  Work with a counselor or diabetes educator to identify strategies to manage stress and any emotional and social challenges. What are some questions to ask my health care provider?  Do I need to meet with a diabetes educator?  Do I need to meet with a dietitian?  What number can I call if I have questions?  When are the best times to check my blood glucose? Where to find more information:  American Diabetes Association: diabetes.org/food-and-fitness/food  Academy of Nutrition and Dietetics:  www.eatright.org/resources/health/diseases-and-conditions/diabetes  National Institute of Diabetes and Digestive and Kidney Diseases (NIH): www.niddk.nih.gov/health-information/diabetes/overview/diet-eating-physical-activity Summary  A healthy meal plan will help you control your blood glucose and maintain a healthy lifestyle.  Working with a diet and nutrition specialist (dietitian) can help you make a meal plan that is best for you.  Keep in mind that carbohydrates and alcohol have immediate effects on your blood glucose levels. It is important to count carbohydrates and to use alcohol carefully. This information is not intended to replace advice given to you by your health care provider. Make sure you discuss any questions you have with your health care provider. Document Released: 02/20/2005 Document Revised: 06/30/2016 Document Reviewed: 06/30/2016 Elsevier Interactive Patient Education  2018 Elsevier Inc.  

## 2017-08-31 NOTE — Telephone Encounter (Signed)
Received fax from  [place] [facility] requesting order, Fax will on the pcp in-box

## 2017-09-02 ENCOUNTER — Encounter (INDEPENDENT_AMBULATORY_CARE_PROVIDER_SITE_OTHER): Payer: Self-pay | Admitting: Orthopaedic Surgery

## 2017-09-02 ENCOUNTER — Ambulatory Visit (INDEPENDENT_AMBULATORY_CARE_PROVIDER_SITE_OTHER): Payer: Medicare HMO

## 2017-09-02 ENCOUNTER — Ambulatory Visit (INDEPENDENT_AMBULATORY_CARE_PROVIDER_SITE_OTHER): Payer: Medicare HMO | Admitting: Orthopaedic Surgery

## 2017-09-02 VITALS — Ht 68.0 in | Wt 162.0 lb

## 2017-09-02 DIAGNOSIS — Z96642 Presence of left artificial hip joint: Secondary | ICD-10-CM

## 2017-09-02 DIAGNOSIS — M25552 Pain in left hip: Secondary | ICD-10-CM

## 2017-09-02 NOTE — Progress Notes (Signed)
The patient is 1 month status post a left total hip arthroplasty to treat a displaced left hip femoral neck fracture.  He is ambulate with a cane and says he is doing well and has no concerns.  On exam his incision looks good the staples have been removed.  There is no significant seroma.  His leg lengths are equal.  An AP pelvis and lateral of his left hip shows a well-seated implant with no complicating features.  At this point since he is doing so well we do not need to see him back for 6 months.  At that visit I would like an AP and lateral just his left hip we do not need to see the pelvis.  All questions concerns were answered and addressed.

## 2017-09-03 ENCOUNTER — Other Ambulatory Visit: Payer: Self-pay | Admitting: *Deleted

## 2017-09-03 ENCOUNTER — Telehealth (INDEPENDENT_AMBULATORY_CARE_PROVIDER_SITE_OTHER): Payer: Self-pay

## 2017-09-03 DIAGNOSIS — E1140 Type 2 diabetes mellitus with diabetic neuropathy, unspecified: Secondary | ICD-10-CM | POA: Diagnosis not present

## 2017-09-03 DIAGNOSIS — L97222 Non-pressure chronic ulcer of left calf with fat layer exposed: Secondary | ICD-10-CM | POA: Diagnosis not present

## 2017-09-03 DIAGNOSIS — D649 Anemia, unspecified: Secondary | ICD-10-CM | POA: Diagnosis not present

## 2017-09-03 DIAGNOSIS — K861 Other chronic pancreatitis: Secondary | ICD-10-CM | POA: Diagnosis not present

## 2017-09-03 DIAGNOSIS — E1165 Type 2 diabetes mellitus with hyperglycemia: Secondary | ICD-10-CM | POA: Diagnosis not present

## 2017-09-03 DIAGNOSIS — I110 Hypertensive heart disease with heart failure: Secondary | ICD-10-CM | POA: Diagnosis not present

## 2017-09-03 DIAGNOSIS — E11622 Type 2 diabetes mellitus with other skin ulcer: Secondary | ICD-10-CM | POA: Diagnosis not present

## 2017-09-03 DIAGNOSIS — I509 Heart failure, unspecified: Secondary | ICD-10-CM | POA: Diagnosis not present

## 2017-09-03 DIAGNOSIS — S72002D Fracture of unspecified part of neck of left femur, subsequent encounter for closed fracture with routine healing: Secondary | ICD-10-CM | POA: Diagnosis not present

## 2017-09-03 NOTE — Patient Outreach (Signed)
[place] [facility]) Care Management  09/03/2017  Melvin [name] [birth year] [id number]   Weekly transition of care call placed to member.  He report he is doing well, recently started home health PT.  Has not checked blood sugar today, but state it was 118 at MD office yesterday.  Report office treated his arm wound yesterday and advised him to see the infectious disease MD again, appointment scheduled for 4/9.  Denies any urgent concerns or questions, agrees to home visit within the next 2 weeks.  [facility] CM Care Plan Problem One     Most Recent Value  Care Plan Problem One  Risk for readmission related to complications of hip replacement as evidence by recent admission   Role Documenting the Problem One  Care Management [place] for Problem One  Active  [facility] Long Term Goal   Member will not be readmitted to hospital within 31 days of discharge  [facility] Long Term Goal Start Date  08/14/17  [facility] Long Term Goal Met Date  09/03/17  [facility] CM Short Term Goal #2   Member will keep and attend follow up with primary MD and surgeon within the next 2 weeks  [facility] CM Short Term Goal #2 Start Date  08/14/17  [facility] CM Short Term Goal #2 Met Date  09/03/17     [name], RN, MSN [place] Manager [phone]

## 2017-09-03 NOTE — Telephone Encounter (Signed)
Verbal order given  

## 2017-09-03 NOTE — Telephone Encounter (Signed)
Merry Proud with [facility] would like verbal orders for patient to have HHPT for 2 x week for 4 weeks for LE strengthening, Gait Balance Training, and Fall Prevention following his LT Hip surgery.  Cb# is [phone].  Please advise.  Thank you.

## 2017-09-04 ENCOUNTER — Telehealth: Payer: Self-pay | Admitting: Family Medicine

## 2017-09-04 NOTE — Telephone Encounter (Signed)
3 page, paperwork received through fax 09-04-17

## 2017-09-08 DIAGNOSIS — E11622 Type 2 diabetes mellitus with other skin ulcer: Secondary | ICD-10-CM | POA: Diagnosis not present

## 2017-09-08 DIAGNOSIS — L97222 Non-pressure chronic ulcer of left calf with fat layer exposed: Secondary | ICD-10-CM | POA: Diagnosis not present

## 2017-09-08 DIAGNOSIS — K861 Other chronic pancreatitis: Secondary | ICD-10-CM | POA: Diagnosis not present

## 2017-09-08 DIAGNOSIS — I509 Heart failure, unspecified: Secondary | ICD-10-CM | POA: Diagnosis not present

## 2017-09-08 DIAGNOSIS — I110 Hypertensive heart disease with heart failure: Secondary | ICD-10-CM | POA: Diagnosis not present

## 2017-09-08 DIAGNOSIS — E1140 Type 2 diabetes mellitus with diabetic neuropathy, unspecified: Secondary | ICD-10-CM | POA: Diagnosis not present

## 2017-09-08 DIAGNOSIS — E1165 Type 2 diabetes mellitus with hyperglycemia: Secondary | ICD-10-CM | POA: Diagnosis not present

## 2017-09-08 DIAGNOSIS — S72002D Fracture of unspecified part of neck of left femur, subsequent encounter for closed fracture with routine healing: Secondary | ICD-10-CM | POA: Diagnosis not present

## 2017-09-08 DIAGNOSIS — D649 Anemia, unspecified: Secondary | ICD-10-CM | POA: Diagnosis not present

## 2017-09-09 ENCOUNTER — Ambulatory Visit (INDEPENDENT_AMBULATORY_CARE_PROVIDER_SITE_OTHER): Payer: Medicare HMO | Admitting: Podiatry

## 2017-09-09 ENCOUNTER — Encounter: Payer: Self-pay | Admitting: Podiatry

## 2017-09-09 DIAGNOSIS — E0842 Diabetes mellitus due to underlying condition with diabetic polyneuropathy: Secondary | ICD-10-CM

## 2017-09-09 DIAGNOSIS — M79676 Pain in unspecified toe(s): Secondary | ICD-10-CM | POA: Diagnosis not present

## 2017-09-09 DIAGNOSIS — B351 Tinea unguium: Secondary | ICD-10-CM

## 2017-09-10 DIAGNOSIS — E1140 Type 2 diabetes mellitus with diabetic neuropathy, unspecified: Secondary | ICD-10-CM | POA: Diagnosis not present

## 2017-09-10 DIAGNOSIS — L97222 Non-pressure chronic ulcer of left calf with fat layer exposed: Secondary | ICD-10-CM | POA: Diagnosis not present

## 2017-09-10 DIAGNOSIS — E11622 Type 2 diabetes mellitus with other skin ulcer: Secondary | ICD-10-CM | POA: Diagnosis not present

## 2017-09-10 DIAGNOSIS — K861 Other chronic pancreatitis: Secondary | ICD-10-CM | POA: Diagnosis not present

## 2017-09-10 DIAGNOSIS — I509 Heart failure, unspecified: Secondary | ICD-10-CM | POA: Diagnosis not present

## 2017-09-10 DIAGNOSIS — E1165 Type 2 diabetes mellitus with hyperglycemia: Secondary | ICD-10-CM | POA: Diagnosis not present

## 2017-09-10 DIAGNOSIS — D649 Anemia, unspecified: Secondary | ICD-10-CM | POA: Diagnosis not present

## 2017-09-10 DIAGNOSIS — I110 Hypertensive heart disease with heart failure: Secondary | ICD-10-CM | POA: Diagnosis not present

## 2017-09-10 DIAGNOSIS — S72002D Fracture of unspecified part of neck of left femur, subsequent encounter for closed fracture with routine healing: Secondary | ICD-10-CM | POA: Diagnosis not present

## 2017-09-11 ENCOUNTER — Other Ambulatory Visit: Payer: Self-pay | Admitting: Family Medicine

## 2017-09-11 DIAGNOSIS — S72002D Fracture of unspecified part of neck of left femur, subsequent encounter for closed fracture with routine healing: Secondary | ICD-10-CM | POA: Diagnosis not present

## 2017-09-11 DIAGNOSIS — I509 Heart failure, unspecified: Secondary | ICD-10-CM | POA: Diagnosis not present

## 2017-09-11 DIAGNOSIS — D649 Anemia, unspecified: Secondary | ICD-10-CM | POA: Diagnosis not present

## 2017-09-11 DIAGNOSIS — K861 Other chronic pancreatitis: Secondary | ICD-10-CM | POA: Diagnosis not present

## 2017-09-11 DIAGNOSIS — I110 Hypertensive heart disease with heart failure: Secondary | ICD-10-CM | POA: Diagnosis not present

## 2017-09-11 DIAGNOSIS — E1140 Type 2 diabetes mellitus with diabetic neuropathy, unspecified: Secondary | ICD-10-CM | POA: Diagnosis not present

## 2017-09-11 DIAGNOSIS — E11622 Type 2 diabetes mellitus with other skin ulcer: Secondary | ICD-10-CM | POA: Diagnosis not present

## 2017-09-11 DIAGNOSIS — L97222 Non-pressure chronic ulcer of left calf with fat layer exposed: Secondary | ICD-10-CM | POA: Diagnosis not present

## 2017-09-11 DIAGNOSIS — M86132 Other acute osteomyelitis, left radius and ulna: Secondary | ICD-10-CM

## 2017-09-11 DIAGNOSIS — E1165 Type 2 diabetes mellitus with hyperglycemia: Secondary | ICD-10-CM | POA: Diagnosis not present

## 2017-09-11 NOTE — Progress Notes (Signed)
   Subjective:  57 year old male with past medical history of type 2 diabetes presenting today for follow-up evaluation an ulceration of the lateral left ankle. He states the wound has improved and resolved. He denies any pain to the area.  Patient is also here with a complaint of thickened, elongated toenails that cause pain while ambulating in shoes. He is unable to trim his own nails. Patient is here for further evaluation and treatment.   Past Medical History:  Diagnosis Date  . Anemia   . Angina   . CHF (congestive heart failure) ([place])   . Diabetes mellitus   . GERD (gastroesophageal reflux disease)   . Hyperlipidemia   . Meningitis   . Neuromuscular disorder ([place])    diabetic neuropathy  . Pancreatitis   . Pneumonia     Objective / Physical Exam:  General:  The patient is alert and oriented x3 in no acute distress. Dermatology:  Skin is warm, dry and supple bilateral lower extremities. Nails are tender, long, thickened and dystrophic with subungual debris, consistent with onychomycosis, 1-5 bilateral. No signs of infection noted.  Wound noted to the lateral left ankle has healed. Complete re-epithelialization has occurred. No drainage noted.   Vascular:  Palpable pedal pulses bilaterally. No edema or erythema noted. Capillary refill within normal limits. Neurological:  Epicritic and protective threshold grossly intact bilaterally.  Musculoskeletal Exam:  Range of motion within normal limits to all pedal and ankle joints bilateral. Muscle strength 5/5 in all groups bilateral.   Assessment: #1 Onychomycosis of nail due to dermatophyte bilateral #2 left lateral ankle ulcer secondary to diabetes mellitus - healed   Plan of Care:  1. Patient evaluated today. 2. Instructed to maintain good pedal hygiene and foot care. Stressed importance of controlling blood sugar.  3. Mechanical debridement of nails 1-5 bilaterally performed using a nail nipper. Filed with dremel without  incident.  4. Return to clinic in 3 mos.     Edrick Kins, DPM [facility]  Dr. Edrick Kins, [address]                                        [place], [place] [address]                Office [phone]  Fax [phone]

## 2017-09-14 DIAGNOSIS — E1140 Type 2 diabetes mellitus with diabetic neuropathy, unspecified: Secondary | ICD-10-CM | POA: Diagnosis not present

## 2017-09-14 DIAGNOSIS — S72002D Fracture of unspecified part of neck of left femur, subsequent encounter for closed fracture with routine healing: Secondary | ICD-10-CM | POA: Diagnosis not present

## 2017-09-14 DIAGNOSIS — D649 Anemia, unspecified: Secondary | ICD-10-CM | POA: Diagnosis not present

## 2017-09-14 DIAGNOSIS — E11622 Type 2 diabetes mellitus with other skin ulcer: Secondary | ICD-10-CM | POA: Diagnosis not present

## 2017-09-14 DIAGNOSIS — L97222 Non-pressure chronic ulcer of left calf with fat layer exposed: Secondary | ICD-10-CM | POA: Diagnosis not present

## 2017-09-14 DIAGNOSIS — I509 Heart failure, unspecified: Secondary | ICD-10-CM | POA: Diagnosis not present

## 2017-09-14 DIAGNOSIS — K861 Other chronic pancreatitis: Secondary | ICD-10-CM | POA: Diagnosis not present

## 2017-09-14 DIAGNOSIS — E1165 Type 2 diabetes mellitus with hyperglycemia: Secondary | ICD-10-CM | POA: Diagnosis not present

## 2017-09-14 DIAGNOSIS — I110 Hypertensive heart disease with heart failure: Secondary | ICD-10-CM | POA: Diagnosis not present

## 2017-09-15 ENCOUNTER — Ambulatory Visit: Payer: Medicare HMO | Admitting: Internal Medicine

## 2017-09-16 ENCOUNTER — Other Ambulatory Visit: Payer: Self-pay | Admitting: *Deleted

## 2017-09-16 NOTE — Patient Outreach (Signed)
[place] [facility]) Care Management  09/16/2017  Melvin [name] 1961-03-12 [id number]   Call placed to member to confirm member would be available for home visit today.  He report he is currently at the dentist and they are running behind.  Advised to contact this care manager once he is back home.  Will reschedule if needed.     Update @ 1800:  No call received back from member.  Will follow up within the next 2 weeks to reschedule.  [name], [place], MSN [place] [phone]

## 2017-09-17 DIAGNOSIS — E1140 Type 2 diabetes mellitus with diabetic neuropathy, unspecified: Secondary | ICD-10-CM | POA: Diagnosis not present

## 2017-09-17 DIAGNOSIS — K861 Other chronic pancreatitis: Secondary | ICD-10-CM | POA: Diagnosis not present

## 2017-09-17 DIAGNOSIS — L97222 Non-pressure chronic ulcer of left calf with fat layer exposed: Secondary | ICD-10-CM | POA: Diagnosis not present

## 2017-09-17 DIAGNOSIS — E1165 Type 2 diabetes mellitus with hyperglycemia: Secondary | ICD-10-CM | POA: Diagnosis not present

## 2017-09-17 DIAGNOSIS — S72002D Fracture of unspecified part of neck of left femur, subsequent encounter for closed fracture with routine healing: Secondary | ICD-10-CM | POA: Diagnosis not present

## 2017-09-17 DIAGNOSIS — E11622 Type 2 diabetes mellitus with other skin ulcer: Secondary | ICD-10-CM | POA: Diagnosis not present

## 2017-09-17 DIAGNOSIS — I509 Heart failure, unspecified: Secondary | ICD-10-CM | POA: Diagnosis not present

## 2017-09-17 DIAGNOSIS — I110 Hypertensive heart disease with heart failure: Secondary | ICD-10-CM | POA: Diagnosis not present

## 2017-09-17 DIAGNOSIS — D649 Anemia, unspecified: Secondary | ICD-10-CM | POA: Diagnosis not present

## 2017-09-18 DIAGNOSIS — I110 Hypertensive heart disease with heart failure: Secondary | ICD-10-CM | POA: Diagnosis not present

## 2017-09-18 DIAGNOSIS — D649 Anemia, unspecified: Secondary | ICD-10-CM | POA: Diagnosis not present

## 2017-09-18 DIAGNOSIS — S72002D Fracture of unspecified part of neck of left femur, subsequent encounter for closed fracture with routine healing: Secondary | ICD-10-CM | POA: Diagnosis not present

## 2017-09-18 DIAGNOSIS — L97222 Non-pressure chronic ulcer of left calf with fat layer exposed: Secondary | ICD-10-CM | POA: Diagnosis not present

## 2017-09-18 DIAGNOSIS — E1165 Type 2 diabetes mellitus with hyperglycemia: Secondary | ICD-10-CM | POA: Diagnosis not present

## 2017-09-18 DIAGNOSIS — E11622 Type 2 diabetes mellitus with other skin ulcer: Secondary | ICD-10-CM | POA: Diagnosis not present

## 2017-09-18 DIAGNOSIS — I509 Heart failure, unspecified: Secondary | ICD-10-CM | POA: Diagnosis not present

## 2017-09-18 DIAGNOSIS — E1140 Type 2 diabetes mellitus with diabetic neuropathy, unspecified: Secondary | ICD-10-CM | POA: Diagnosis not present

## 2017-09-18 DIAGNOSIS — K861 Other chronic pancreatitis: Secondary | ICD-10-CM | POA: Diagnosis not present

## 2017-09-21 DIAGNOSIS — D649 Anemia, unspecified: Secondary | ICD-10-CM | POA: Diagnosis not present

## 2017-09-21 DIAGNOSIS — I110 Hypertensive heart disease with heart failure: Secondary | ICD-10-CM | POA: Diagnosis not present

## 2017-09-21 DIAGNOSIS — E11622 Type 2 diabetes mellitus with other skin ulcer: Secondary | ICD-10-CM | POA: Diagnosis not present

## 2017-09-21 DIAGNOSIS — K861 Other chronic pancreatitis: Secondary | ICD-10-CM | POA: Diagnosis not present

## 2017-09-21 DIAGNOSIS — S72002D Fracture of unspecified part of neck of left femur, subsequent encounter for closed fracture with routine healing: Secondary | ICD-10-CM | POA: Diagnosis not present

## 2017-09-21 DIAGNOSIS — E1165 Type 2 diabetes mellitus with hyperglycemia: Secondary | ICD-10-CM | POA: Diagnosis not present

## 2017-09-21 DIAGNOSIS — L97222 Non-pressure chronic ulcer of left calf with fat layer exposed: Secondary | ICD-10-CM | POA: Diagnosis not present

## 2017-09-21 DIAGNOSIS — I509 Heart failure, unspecified: Secondary | ICD-10-CM | POA: Diagnosis not present

## 2017-09-21 DIAGNOSIS — E1140 Type 2 diabetes mellitus with diabetic neuropathy, unspecified: Secondary | ICD-10-CM | POA: Diagnosis not present

## 2017-09-22 DIAGNOSIS — D649 Anemia, unspecified: Secondary | ICD-10-CM | POA: Diagnosis not present

## 2017-09-22 DIAGNOSIS — E1140 Type 2 diabetes mellitus with diabetic neuropathy, unspecified: Secondary | ICD-10-CM | POA: Diagnosis not present

## 2017-09-22 DIAGNOSIS — I110 Hypertensive heart disease with heart failure: Secondary | ICD-10-CM | POA: Diagnosis not present

## 2017-09-22 DIAGNOSIS — K861 Other chronic pancreatitis: Secondary | ICD-10-CM | POA: Diagnosis not present

## 2017-09-22 DIAGNOSIS — E1165 Type 2 diabetes mellitus with hyperglycemia: Secondary | ICD-10-CM | POA: Diagnosis not present

## 2017-09-22 DIAGNOSIS — S72002D Fracture of unspecified part of neck of left femur, subsequent encounter for closed fracture with routine healing: Secondary | ICD-10-CM | POA: Diagnosis not present

## 2017-09-22 DIAGNOSIS — I509 Heart failure, unspecified: Secondary | ICD-10-CM | POA: Diagnosis not present

## 2017-09-22 DIAGNOSIS — L97222 Non-pressure chronic ulcer of left calf with fat layer exposed: Secondary | ICD-10-CM | POA: Diagnosis not present

## 2017-09-22 DIAGNOSIS — E11622 Type 2 diabetes mellitus with other skin ulcer: Secondary | ICD-10-CM | POA: Diagnosis not present

## 2017-09-24 DIAGNOSIS — E1140 Type 2 diabetes mellitus with diabetic neuropathy, unspecified: Secondary | ICD-10-CM | POA: Diagnosis not present

## 2017-09-24 DIAGNOSIS — I110 Hypertensive heart disease with heart failure: Secondary | ICD-10-CM | POA: Diagnosis not present

## 2017-09-24 DIAGNOSIS — E11622 Type 2 diabetes mellitus with other skin ulcer: Secondary | ICD-10-CM | POA: Diagnosis not present

## 2017-09-24 DIAGNOSIS — I509 Heart failure, unspecified: Secondary | ICD-10-CM | POA: Diagnosis not present

## 2017-09-24 DIAGNOSIS — K861 Other chronic pancreatitis: Secondary | ICD-10-CM | POA: Diagnosis not present

## 2017-09-24 DIAGNOSIS — L97222 Non-pressure chronic ulcer of left calf with fat layer exposed: Secondary | ICD-10-CM | POA: Diagnosis not present

## 2017-09-24 DIAGNOSIS — E1165 Type 2 diabetes mellitus with hyperglycemia: Secondary | ICD-10-CM | POA: Diagnosis not present

## 2017-09-24 DIAGNOSIS — D649 Anemia, unspecified: Secondary | ICD-10-CM | POA: Diagnosis not present

## 2017-09-24 DIAGNOSIS — S72002D Fracture of unspecified part of neck of left femur, subsequent encounter for closed fracture with routine healing: Secondary | ICD-10-CM | POA: Diagnosis not present

## 2017-09-29 ENCOUNTER — Ambulatory Visit: Payer: Medicare HMO | Admitting: Family Medicine

## 2017-10-04 ENCOUNTER — Emergency Department (HOSPITAL_COMMUNITY)
Admission: EM | Admit: 2017-10-04 | Discharge: 2017-10-04 | Disposition: A | Discharge status: Home / Self Care | Payer: Medicare HMO | Attending: Emergency Medicine | Admitting: Emergency Medicine

## 2017-10-04 DIAGNOSIS — E1140 Type 2 diabetes mellitus with diabetic neuropathy, unspecified: Secondary | ICD-10-CM | POA: Diagnosis not present

## 2017-10-04 DIAGNOSIS — F1410 Cocaine abuse, uncomplicated: Secondary | ICD-10-CM

## 2017-10-04 DIAGNOSIS — R079 Chest pain, unspecified: Secondary | ICD-10-CM | POA: Insufficient documentation

## 2017-10-04 DIAGNOSIS — Z96642 Presence of left artificial hip joint: Secondary | ICD-10-CM | POA: Insufficient documentation

## 2017-10-04 DIAGNOSIS — I110 Hypertensive heart disease with heart failure: Secondary | ICD-10-CM | POA: Diagnosis not present

## 2017-10-04 DIAGNOSIS — E1065 Type 1 diabetes mellitus with hyperglycemia: Secondary | ICD-10-CM | POA: Diagnosis not present

## 2017-10-04 DIAGNOSIS — R112 Nausea with vomiting, unspecified: Secondary | ICD-10-CM | POA: Diagnosis not present

## 2017-10-04 DIAGNOSIS — I5032 Chronic diastolic (congestive) heart failure: Secondary | ICD-10-CM | POA: Diagnosis not present

## 2017-10-04 DIAGNOSIS — E1010 Type 1 diabetes mellitus with ketoacidosis without coma: Secondary | ICD-10-CM

## 2017-10-04 DIAGNOSIS — R739 Hyperglycemia, unspecified: Secondary | ICD-10-CM | POA: Diagnosis not present

## 2017-10-04 DIAGNOSIS — Z7982 Long term (current) use of aspirin: Secondary | ICD-10-CM | POA: Insufficient documentation

## 2017-10-04 DIAGNOSIS — R1010 Upper abdominal pain, unspecified: Secondary | ICD-10-CM | POA: Diagnosis not present

## 2017-10-04 DIAGNOSIS — Z794 Long term (current) use of insulin: Secondary | ICD-10-CM | POA: Diagnosis not present

## 2017-10-04 LAB — URINALYSIS, ROUTINE W REFLEX MICROSCOPIC
Bacteria, UA: NONE SEEN
Bilirubin Urine: NEGATIVE
Glucose, UA: >=500 mg/dL — AB
Ketones, ur: 20 mg/dL — AB
Leukocytes, UA: NEGATIVE
Nitrite: NEGATIVE
Protein, ur: 30 mg/dL — AB
SPECIFIC GRAVITY, URINE: 1.017 (ref 1.005–1.030)
pH: 5 (ref 5.0–8.0)

## 2017-10-04 LAB — BASIC METABOLIC PANEL
Anion gap: 18 — ABNORMAL HIGH (ref 5–15)
BUN: 28 mg/dL — AB (ref 6–20)
CO2: 16 mmol/L — AB (ref 22–32)
CREATININE: 2.34 mg/dL — AB (ref 0.61–1.24)
Calcium: 8.2 mg/dL — ABNORMAL LOW (ref 8.9–10.3)
Chloride: 101 mmol/L (ref 101–111)
GFR calc Af Amer: 34 mL/min — ABNORMAL LOW (ref >60)
GFR calc non Af Amer: 29 mL/min — ABNORMAL LOW (ref >60)
GLUCOSE: 426 mg/dL — AB (ref 65–99)
Potassium: 3.9 mmol/L (ref 3.5–5.1)
Sodium: 135 mmol/L (ref 135–145)

## 2017-10-04 LAB — CBC WITH DIFFERENTIAL/PLATELET
Basophils Absolute: 0 10*3/uL (ref 0.0–0.1)
Basophils Relative: 0 %
EOS ABS: 0 10*3/uL (ref 0.0–0.7)
Eosinophils Relative: 0 %
HEMATOCRIT: 35.8 % — AB (ref 39.0–52.0)
Hemoglobin: 12.1 g/dL — ABNORMAL LOW (ref 13.0–17.0)
LYMPHS ABS: 0.7 10*3/uL (ref 0.7–4.0)
Lymphocytes Relative: 7 %
MCH: 30.8 pg (ref 26.0–34.0)
MCHC: 33.8 g/dL (ref 30.0–36.0)
MCV: 91.1 fL (ref 78.0–100.0)
MONO ABS: 1.5 10*3/uL — AB (ref 0.1–1.0)
MONOS PCT: 14 %
NEUTROS ABS: 8.8 10*3/uL — AB (ref 1.7–7.7)
NEUTROS PCT: 79 %
Platelets: 151 10*3/uL (ref 150–400)
RBC: 3.93 MIL/uL — ABNORMAL LOW (ref 4.22–5.81)
RDW: 13.9 % (ref 11.5–15.5)
WBC: 11 10*3/uL — ABNORMAL HIGH (ref 4.0–10.5)

## 2017-10-04 LAB — COMPREHENSIVE METABOLIC PANEL
ALBUMIN: 2.7 g/dL — AB (ref 3.5–5.0)
ALT: 15 U/L — AB (ref 17–63)
AST: 15 U/L (ref 15–41)
Alkaline Phosphatase: 99 U/L (ref 38–126)
Anion gap: 17 — ABNORMAL HIGH (ref 5–15)
BUN: 30 mg/dL — AB (ref 6–20)
CO2: 20 mmol/L — ABNORMAL LOW (ref 22–32)
CREATININE: 2.54 mg/dL — AB (ref 0.61–1.24)
Calcium: 8.8 mg/dL — ABNORMAL LOW (ref 8.9–10.3)
Chloride: 96 mmol/L — ABNORMAL LOW (ref 101–111)
GFR calc Af Amer: 30 mL/min — ABNORMAL LOW (ref >60)
GFR calc non Af Amer: 26 mL/min — ABNORMAL LOW (ref >60)
GLUCOSE: 469 mg/dL — AB (ref 65–99)
POTASSIUM: 4.1 mmol/L (ref 3.5–5.1)
Sodium: 133 mmol/L — ABNORMAL LOW (ref 135–145)
TOTAL PROTEIN: 7.8 g/dL (ref 6.5–8.1)
Total Bilirubin: 1.8 mg/dL — ABNORMAL HIGH (ref 0.3–1.2)

## 2017-10-04 LAB — RAPID URINE DRUG SCREEN, HOSP PERFORMED
AMPHETAMINES: NOT DETECTED
BENZODIAZEPINES: NOT DETECTED
Barbiturates: NOT DETECTED
COCAINE: POSITIVE — AB
OPIATES: NOT DETECTED
TETRAHYDROCANNABINOL: NOT DETECTED

## 2017-10-04 LAB — ETHANOL: Alcohol, Ethyl (B): <10 mg/dL (ref <10)

## 2017-10-04 LAB — LIPASE, BLOOD: Lipase: 16 U/L (ref 11–51)

## 2017-10-04 LAB — CBG MONITORING, ED: Glucose-Capillary: 459 mg/dL — ABNORMAL HIGH (ref 65–99)

## 2017-10-04 MED ORDER — GI COCKTAIL ~~LOC~~
30.0000 mL | Freq: Once | ORAL | Status: AC
  Administered 2017-10-04: 30 mL via ORAL
  Filled 2017-10-04: qty 30

## 2017-10-04 MED ORDER — SODIUM CHLORIDE 0.9 % IV BOLUS
1000.0000 mL | Freq: Once | INTRAVENOUS | Status: AC
  Administered 2017-10-04: 1000 mL via INTRAVENOUS

## 2017-10-04 MED ORDER — SODIUM CHLORIDE 0.9 % IV BOLUS
2000.0000 mL | Freq: Once | INTRAVENOUS | Status: AC
  Administered 2017-10-04: 2000 mL via INTRAVENOUS

## 2017-10-04 NOTE — ED Notes (Signed)
Pt drinking water with no problems

## 2017-10-04 NOTE — ED Triage Notes (Signed)
Pt arrived via [facility] from home c/o sharp midsternal chest pain started today, N/V x2 days, and hyperglycemia.  CBG 509 pt states he hasn't been taking his insulin.  EMS gave 232ml NS.  Hx Pancreatitis, states he has full body skin fungus also.

## 2017-10-04 NOTE — Discharge Instructions (Signed)
It is very important to start using your insulin as soon as possible.  Try to drink 1 to 2 L of water each day.  Avoid cocaine.  See your doctor for checkup as soon as possible.

## 2017-10-04 NOTE — ED Provider Notes (Signed)
[facility]ERGENCY DEPARTMENT Provider Note   CSN: [id number] Arrival date & time: 10/04/17  1549     History   Chief Complaint No chief complaint on file.   HPI Melvin [name] is a 57 y.o. male.  He presents for evaluation of chest discomfort, nausea, vomiting and hyperglycemia.  He is not currently taking his prescribed medication, insulin, because he has been vomiting for 2 days.  He denies diarrhea, fever, chills, cough, shortness of breath, back pain, difficulty walking.  He has mild upper abdominal pain.  His daughter was sick at home with a similar illness.  He has a follow-up appointment with his PCP, tomorrow.  There are no other known modifying factors.    HPI  Past Medical History:  Diagnosis Date  . Anemia   . Angina   . CHF (congestive heart failure) ([place])   . Diabetes mellitus   . Diverticulosis   . GERD (gastroesophageal reflux disease)   . Hyperlipidemia   . Meningitis   . Neuromuscular disorder ([place])    diabetic neuropathy  . Pancreatitis   . Pneumonia     Patient Active Problem List   Diagnosis Date Noted  . Status post total replacement of left hip 09/02/2017  . Pain in left hip 09/02/2017  . Thrombocytopenia ([place]) 07/31/2017  . Neuropathy 07/31/2017  . Closed left hip fracture ([place]) 07/31/2017  . Displaced fracture of base of neck of left femur, initial encounter for closed fracture ([place])   . Skin ulcer of left lower leg, limited to breakdown of skin ([place])   . Streptococcus viridans infection 06/14/2017  . Diabetes mellitus type 2, insulin dependent ([place])   . Diabetic ulcer of left lower leg ([place]) 06/11/2017  . Diabetic ulcer of left wrist ([place]) 06/11/2017  . Cellulitis of left forearm   . Abscess 06/10/2017  . Osteomyelitis ([place]) 06/10/2017  . Pancreatic insufficiency 02/13/2017  . AKI (acute kidney injury) ([place]) 06/18/2016  . Diabetes mellitus without complication ([place]) 38/75/6433  . Chronic diastolic CHF  (congestive heart failure) ([place]) 06/18/2016  . Diarrhea 06/18/2016  . Unilateral primary osteoarthritis, left knee 04/23/2016  . Hyperlipidemia 11/14/2014  . Erectile dysfunction 11/14/2014  . Diabetic neuropathy ([place]) 10/31/2014  . Hypoglycemia associated with diabetes ([place])   . Alcoholism ([place])   . Cocaine abuse ([place])   . Blurry vision, bilateral   . HSV-1 (herpes simplex virus 1) infection   . HSV-2 (herpes simplex virus 2) infection   . Congestive heart disease ([place])   . Essential hypertension   . DM w/o complication type II ([place])   . Hypothermia 10/24/2014  . Near syncope   . Weakness   . Dyspnea on exertion   . Palpitation   . Alcohol abuse   . Genital HSV   . ETOH abuse 07/28/2011  . Pulmonary nodule 05/30/2011  . Pancreatitis 05/28/2011  . Diabetes ([place]) 05/28/2011    Past Surgical History:  Procedure Laterality Date  . ERCP  08/02/2011   Procedure: ENDOSCOPIC RETROGRADE CHOLANGIOPANCREATOGRAPHY (ERCP);  Surgeon: Beryle Beams, MD;  [facility] END[facility]COPY;  Service: Endoscopy;  Laterality: N/A;  . ERCP  08/22/2011   Procedure: ENDOSCOPIC RETROGRADE CHOLANGIOPANCREATOGRAPHY (ERCP);  Surgeon: Beryle Beams, MD;  Location: [facility] ENDOSCOPY;  Service: Endoscopy;  Laterality: N/A;  . I&D EXTREMITY Left 06/10/2017   Procedure: IRRIGATION AND DEBRIDEMENT EXTREMITY, left wrist;  Surgeon: Milly Jakob, MD;  Location: [facility];  Service: Orthopedics;  Laterality: Left;  . TONSILLECTOMY AND ADENOIDECTOMY     "  as a kid"  . TOTAL HIP ARTHROPLASTY Left 08/01/2017   Procedure: LEFT TOTAL HIP REPLACEMENT ANTERIOR APPROACH;  Surgeon: Mcarthur Rossetti, MD;  Location: [facility];  Service: Orthopedics;  Laterality: Left;  . TYMPANOSTOMY TUBE PLACEMENT          Home Medications    Prior to Admission medications   Medication Sig Start Date End Date Taking? Authorizing Provider  ACCU-CHEK FASTCLIX LANCETS MISC TEST BLOOD GLUCOSE THREE TIMES DAILY AS DIRECTED 08/04/17   Charlott Rakes,  MD  ACCU-CHEK SMARTVIEW test strip USE AS DIRECTED TO TEST BLOOD GLUCOSE THREE TIMES DAILY 08/04/17   Charlott Rakes, MD  ACCU-CHEK SOFTCLIX LANCETS lancets Use as instructed for 3 times daily testing of blood sugar. E11.9 09/23/16   Charlott Rakes, MD  Alcohol Swabs (B-D SINGLE USE SWABS REGULAR) PADS USE AS DIRECTED THREE TIMES DAILY  AND AT BEDTIME 12/30/16   Charlott Rakes, MD  aspirin 81 MG EC tablet Take 1 tablet (81 mg total) by mouth daily. 11/14/14   Charlott Rakes, MD  atorvastatin (LIPITOR) 10 MG tablet TAKE 1 TABLET (10 MG)  BY MOUTH DAILY AT BEDTIME. 08/31/17   Charlott Rakes, MD  bacitracin ointment Apply topically 2 (two) times daily. 06/15/17   Santos-Sanchez, Merlene Morse, MD  Blood Glucose Monitoring Suppl (ACCU-CHEK NANO SMARTVIEW) w/Device KIT Use as directed for 3 times daily testing of blood glucose. 10/09/16   Charlott Rakes, MD  cephALEXin (KEFLEX) 500 MG capsule Take 1 capsule (500 mg total) by mouth 2 (two) times daily. 08/31/17   Charlott Rakes, MD  gabapentin (NEURONTIN) 300 MG capsule Take 2 capsules (600 mg total) by mouth 2 (two) times daily. 08/31/17   Charlott Rakes, MD  HYDROcodone-acetaminophen (NORCO/VICODIN) 5-325 MG tablet Take 1-2 tablets by mouth every 6 (six) hours as needed for moderate pain ((score 4 to 6)). 08/04/17   Domenic Polite, MD  insulin aspart (NOVOLOG) 100 UNIT/ML injection 150-200, give 3 units 201-250 give 5 units 251-300 give 7 units 301-350 give 9 units 351-400 give 12 units Patient taking differently: Inject 2-5 Units into the skin 3 (three) times daily with meals.  06/10/17   Charlott Rakes, MD  insulin glargine (LANTUS) 100 UNIT/ML injection Inject 0.3 mLs (30 Units total) into the skin at bedtime. 08/31/17   Charlott Rakes, MD  Insulin Syringe-Needle U-100 (B-D INS SYRINGE 0.5CC/31GX5/16) 31G X 5/16" 0.5 ML MISC USE TO INJECT INSULIN 3 TIMES A DAY AND AT BEDTIME 10/27/16   Charlott Rakes, MD  Lancet Devices [facility]) lancets Use as  instructed for 3 times daily testing of blood sugar. E11.9 09/23/16   Charlott Rakes, MD  naproxen (NAPROSYN) 500 MG tablet TAKE 1 TABLET (500 MG TOTAL) BY MOUTH 2 (TWO) TIMES DAILY WITH A MEAL. Patient taking differently: Take 500 mg by mouth 2 (two) times daily as needed (pain).  04/28/17   Charlott Rakes, MD  Pancrelipase, Lip-Prot-Amyl, (ZENPEP) 25000-79000 units CPEP Take 25,000 Units by mouth 3 (three) times daily with meals.    [provider]  senna (SENOKOT) 8.6 MG TABS tablet Take 1 tablet (8.6 mg total) by mouth at bedtime as needed for mild constipation. 08/04/17   Domenic Polite, MD    Family History Family History  Problem Relation Age of Onset  . Heart failure Mother   . Diabetes Mother   . Hypertension Mother   . Heart disease Father   . Heart failure Father   . Colon cancer Neg Hx   . Colon polyps Neg Hx   .  Esophageal cancer Neg Hx   . Rectal cancer Neg Hx   . Stomach cancer Neg Hx     Social History Social History   Tobacco Use  . Smoking status: Never Smoker  . Smokeless tobacco: Never Used  Substance Use Topics  . Alcohol use: Yes    Alcohol/week: 4.2 oz    Types: 3 Cans of beer, 4 Shots of liquor per week    Comment: couple times weekly  . Drug use: Yes    Types: Cocaine    Comment: pt reports last drug use X5 year ago      Allergies   Patient has no known allergies.   Review of Systems Review of Systems  All other systems reviewed and are negative.    Physical Exam Updated Vital Signs BP (!) 170/89   Pulse (!) 114   Temp (!) 97.3 F (36.3 C) (Oral)   Resp 20   SpO2 94%   Physical Exam  Constitutional: He is oriented to person, place, and time. He appears well-developed and well-nourished.  HENT:  Head: Normocephalic and atraumatic.  Right Ear: External ear normal.  Left Ear: External ear normal.  Eyes: Pupils are equal, round, and reactive to light. Conjunctivae and EOM are normal.  Neck: Normal range of motion and  phonation normal. Neck supple.  Cardiovascular: Normal rate, regular rhythm and normal heart sounds.  Pulmonary/Chest: Effort normal and breath sounds normal. No respiratory distress. He exhibits no bony tenderness.  Abdominal: Soft. He exhibits no distension and no mass. There is no tenderness. There is no guarding.  Musculoskeletal: Normal range of motion.  Neurological: He is alert and oriented to person, place, and time. No cranial nerve deficit or sensory deficit. He exhibits normal muscle tone. Coordination normal.  No dysarthria, or aphasia.  Skin: Skin is warm, dry and intact.  Psychiatric: He has a normal mood and affect. His behavior is normal. Judgment and thought content normal.  Nursing note and vitals reviewed.    ED Treatments / Results  Labs (all labs ordered are listed, but only abnormal results are displayed) Labs Reviewed  COMPREHENSIVE METABOLIC PANEL - Abnormal; Notable for the following components:      Result Value   Sodium 133 (*)    Chloride 96 (*)    CO2 20 (*)    Glucose, Bld 469 (*)    BUN 30 (*)    Creatinine, Ser 2.54 (*)    Calcium 8.8 (*)    Albumin 2.7 (*)    ALT 15 (*)    Total Bilirubin 1.8 (*)    GFR calc non Af Amer 26 (*)    GFR calc Af Amer 30 (*)    Anion gap 17 (*)    All other components within normal limits  CBC WITH DIFFERENTIAL/PLATELET - Abnormal; Notable for the following components:   WBC 11.0 (*)    RBC 3.93 (*)    Hemoglobin 12.1 (*)    HCT 35.8 (*)    Neutro Abs 8.8 (*)    Monocytes Absolute 1.5 (*)    All other components within normal limits  URINALYSIS, ROUTINE W REFLEX MICROSCOPIC - Abnormal; Notable for the following components:   Glucose, UA >=500 (*)    Hgb urine dipstick MODERATE (*)    Ketones, ur 20 (*)    Protein, ur 30 (*)    All other components within normal limits  RAPID URINE DRUG SCREEN, HOSP PERFORMED - Abnormal; Notable for the following components:  Cocaine POSITIVE (*)    All other components  within normal limits  BASIC METABOLIC PANEL - Abnormal; Notable for the following components:   CO2 16 (*)    Glucose, Bld 426 (*)    BUN 28 (*)    Creatinine, Ser 2.34 (*)    Calcium 8.2 (*)    GFR calc non Af Amer 29 (*)    GFR calc Af Amer 34 (*)    Anion gap 18 (*)    All other components within normal limits  CBG MONITORING, ED - Abnormal; Notable for the following components:   Glucose-Capillary 459 (*)    All other components within normal limits  ETHANOL  LIPASE, BLOOD    EKG EKG Interpretation  Date/Time:  Sunday October 04 2017 16:09:00 EDT Ventricular Rate:  110 PR Interval:    QRS Duration: 76 QT Interval:  360 QTC Calculation: 487 R Axis:   37 Text Interpretation:  Sinus tachycardia Minimal ST elevation, inferior leads Borderline prolonged QT interval Since last tracing rate faster and  QT has lengthened Confirmed by Daleen Bo [phone]) on 10/04/2017 4:26:29 PM   Radiology No results found.  Procedures Procedures (including critical care time)  Medications Ordered in ED Medications  sodium chloride 0.9 % bolus 1,000 mL (1,000 mLs Intravenous New Bag/Given 10/04/17 2144)  sodium chloride 0.9 % bolus 2,000 mL (0 mLs Intravenous Stopped 10/04/17 1958)  gi cocktail (Maalox,Lidocaine,Donnatal) (30 mLs Oral Given 10/04/17 2143)     Initial Impression / Assessment and Plan / ED Course  I have reviewed the triage vital signs and the nursing notes.  Pertinent labs & imaging results that were available during my care of the patient were reviewed by me and considered in my medical decision making (see chart for details).  Clinical Course as of Oct 05 2211  Sun Oct 04, 2017  2102 He is currently complaining of pain in the right side of his chest which occurs when he attempts to swallow.  He states this makes him not want to swallow.  Will try GI cocktail then reassess.   [EW]  2103 Abnormal, cocaine present  Urine rapid drug screen (hosp performed)(!) [EW]  2103  Normal except elevated glucose, hemoglobin, ketones and protein  Urinalysis, Routine w reflex microscopic(!) [EW]  2103 Normal except elevated white count, and hemoglobin low.  CBC with Differential(!) [EW]  2103 Normal normal except low CO2 16, anion gap elevated 18, glucose high 426, BUN high 28, creatinine high 2.3  Basic metabolic panel(!) [EW]  0076 Normal  Lipase, blood [EW]  2104 Normal except sodium low 133, chloride low 96, CO2 low 20, glucose high 469, BUN high 30, creatinine high 2.5, calcium low 8.8, albumin low 2.7, ALT low 15  Comprehensive metabolic panel(!) [EW]  2263 High  CBG monitoring, ED(!) [EW]    Clinical Course User Index [EW] Daleen Bo, MD     Patient Vitals for the past 24 hrs:  BP Temp Temp src Pulse Resp SpO2  10/04/17 2100 (!) 170/89 - - (!) 114 20 94 %  10/04/17 2045 - - - (!) 116 (!) 21 95 %  10/04/17 2030 (!) 160/94 - - (!) 116 (!) 23 (!) 89 %  10/04/17 1830 (!) 142/87 - - (!) 112 16 95 %  10/04/17 1800 137/75 - - (!) 107 17 92 %  10/04/17 1730 139/85 - - (!) 112 (!) 23 (!) 82 %  10/04/17 1700 125/79 - - (!) 111 20 95 %  10/04/17  1630 (!) 141/90 - - (!) 110 19 95 %  10/04/17 1610 140/88 (!) 97.3 F (36.3 C) Oral (!) 110 (!) 23 96 %    10:09 PM Reevaluation with update and discussion. After initial assessment and treatment, an updated evaluation reveals after GI cocktail he feels better and is able to swallow without discomfort.  Findings discussed with the patient and all questions answered.  He understands that avoiding cocaine is a good idea. Superior decision making-nausea and vomiting associated with use of cocaine.  Hyperglycemia secondary to not taking insulin and using cocaine.  Mild DKA present, patient tolerating oral fluids, and has been hydrated here.  Doubt impending vascular collapse, serious bacterial infection.  Nursing Notes Reviewed/ Care Coordinated Applicable Imaging Reviewed Interpretation of Laboratory Data  incorporated into ED treatment  The patient appears reasonably screened and/or stabilized for discharge and I doubt any other medical condition or other [facility] requiring further screening, evaluation, or treatment in the ED at this time prior to discharge.  Plan: Home Medications-continue usual medications; Home Treatments-start clear liquid diet and gradually advance to regular foods.  Avoid all forms of cocaine; return here if the recommended treatment, does not improve the symptoms; Recommended follow up-follow-up PCP in 1 week for checkup.      Final Clinical Impressions(s) / ED Diagnoses   Final diagnoses:  Nausea and vomiting, intractability of vomiting not specified, unspecified vomiting type  Cocaine abuse ([place])  Diabetic ketoacidosis without coma associated with type 1 diabetes mellitus ([place])  Hyperglycemia    ED Discharge Orders    None       Daleen Bo, MD 10/04/17 2213

## 2017-10-05 ENCOUNTER — Ambulatory Visit: Payer: Medicare HMO | Admitting: Family Medicine

## 2017-10-05 ENCOUNTER — Other Ambulatory Visit: Payer: Self-pay | Admitting: *Deleted

## 2017-10-05 NOTE — Patient Outreach (Addendum)
[facility]) Care Management  10/05/2017  ISABELLA IDA [birth year] [id number]   Noted that member was seen in the ED yesterday with complaints of nausea/vomiting.  Per test results, glucose was greater than 400, per ED notes, member has not been taking his insulin.  Also noted that member tested positive for cocaine.  Call placed to member to follow up on current health status, no answer.  HIPAA compliant voice message left.  Will await call back.  Unsuccessful letter sent.  If no response within the next 3 business days, will follow up with another call.    Update @ 1630:  Voice message received back from member requesting call back.  Call placed back to member.  He report he is "alright" today but state he was seen in the ED because he wasn't "doing right."  He report today that he is still "not doing right."  This care manager attempted to have member explain further, but he state he would prefer to discuss face to face.  He denies any urgent concerns, home visit scheduled for next week.   [name], [place], MSN [place] [phone]

## 2017-10-12 ENCOUNTER — Other Ambulatory Visit: Payer: Self-pay | Admitting: *Deleted

## 2017-10-12 ENCOUNTER — Telehealth: Payer: Self-pay | Admitting: Family Medicine

## 2017-10-12 ENCOUNTER — Telehealth: Payer: Self-pay

## 2017-10-12 NOTE — Telephone Encounter (Signed)
Call received from [facility], RN/[facility]N.  She was with the patient and wanted to provide and update as she was with the patient. She reported that his BP is low today - she checked both arms multiple times: 84/50, 82/50 and 78/42.  The patient is not complaining of dizziness but stated that his vision is " a little blurry."  He said that he has been drinking about 3 bottles of water /day. She also noted that he has a productive cough, greenish sputum. No fever. His blood sugar this morning was 143 and his O2 sat now is 92%. She also noted that he has an ulcer on his left wrist.  the patient was scheduled for an appointment with Dr Margarita Rana for 10/14/17 @ 1350.  Informed Brayton Layman that this information would e shared with Dr Margarita Rana.

## 2017-10-12 NOTE — Telephone Encounter (Signed)
He has a history of left wrist osteomyelitis status post debridement and irrigation in 06/2017.  If he has drainage from the wound with a low blood pressure and fever,sepsis will be the major concern.  Advise repeating vitals later in the day and if symptomatic he will have to go to the emergency room.

## 2017-10-12 NOTE — Telephone Encounter (Signed)
[facility] called to say they where at home with the patient and his BP was low it 82/50 in left arm and 78/42 in right arm. His o2 level is 92%

## 2017-10-12 NOTE — Patient Outreach (Signed)
[name]   10/12/2017  Melvin [name] 04-15-1961 [id number]  Melvin [name] is an 57 y.o. male  Subjective:   Member alert and oriented x3.  Denies any pain, but does complain of persistent cough with greenish sputum.  He has been taking over the counter cold medicine (day time and night time) without relief.  He was scheduled to see primary MD on 4/29 but was unable to keep appointment due to waking up too late.  Objective:   Review of Systems  Constitutional: Negative.   HENT: Negative.   Eyes: Positive for blurred vision.  Respiratory: Positive for cough and sputum production.   Cardiovascular: Negative.   Gastrointestinal: Negative.   Genitourinary: Negative.   Musculoskeletal: Negative.   Skin:       Skin ulcers on legs and wrists  Neurological: Negative.   Endo/Heme/Allergies: Negative.   Psychiatric/Behavioral: Negative.     Physical Exam  Constitutional: He is oriented to person, place, and time. He appears well-developed and well-nourished.  Neck: Normal range of motion.  Cardiovascular: Normal rate, regular rhythm and normal heart sounds.  Respiratory: Effort normal and breath sounds normal.  GI: Soft. Bowel sounds are normal.  Musculoskeletal: Normal range of motion.  Neurological: He is alert and oriented to person, place, and time.  Skin: Skin is warm and dry.   BP (!) 78/42 (BP Location: Right Arm, Patient Position: Standing, Cuff Size: Normal)   Pulse 85   Resp 18   SpO2 92%   Encounter Medications:   Outpatient Encounter Medications as of 10/12/2017  Medication Sig Note  . ACCU-CHEK FASTCLIX LANCETS MISC TEST BLOOD GLUCOSE THREE TIMES DAILY AS DIRECTED   . ACCU-CHEK SMARTVIEW test strip USE AS DIRECTED TO TEST BLOOD GLUCOSE THREE TIMES DAILY   . ACCU-CHEK SOFTCLIX LANCETS lancets Use as instructed for 3 times daily testing of blood sugar. E11.9   . Alcohol Swabs (B-D SINGLE USE SWABS REGULAR) PADS USE AS  DIRECTED THREE TIMES DAILY  AND AT BEDTIME   . atorvastatin (LIPITOR) 10 MG tablet TAKE 1 TABLET (10 MG)  BY MOUTH DAILY AT BEDTIME.   Marland Kitchen Blood Glucose Monitoring Suppl (ACCU-CHEK NANO SMARTVIEW) w/Device KIT Use as directed for 3 times daily testing of blood glucose.   . gabapentin (NEURONTIN) 300 MG capsule Take 2 capsules (600 mg total) by mouth 2 (two) times daily.   . insulin aspart (NOVOLOG) 100 UNIT/ML injection 150-200, give 3 units 201-250 give 5 units 251-300 give 7 units 301-350 give 9 units 351-400 give 12 units (Patient taking differently: Inject 2-5 Units into the skin 3 (three) times daily with meals. ) 08/01/2017: Pt could not articulate details of sliding scale  . insulin glargine (LANTUS) 100 UNIT/ML injection Inject 0.3 mLs (30 Units total) into the skin at bedtime.   . Insulin Syringe-Needle U-100 (B-D INS SYRINGE 0.5CC/31GX5/16) 31G X 5/16" 0.5 ML MISC USE TO INJECT INSULIN 3 TIMES A DAY AND AT BEDTIME   . Lancet Devices (ACCU-CHEK SOFTCLIX) lancets Use as instructed for 3 times daily testing of blood sugar. E11.9   . naproxen (NAPROSYN) 500 MG tablet TAKE 1 TABLET (500 MG TOTAL) BY MOUTH 2 (TWO) TIMES DAILY WITH A MEAL. (Patient taking differently: Take 500 mg by mouth 2 (two) times daily as needed (pain). )   . Pancrelipase, Lip-Prot-Amyl, (ZENPEP) 25000-79000 units CPEP Take 25,000 Units by mouth 3 (three) times daily with meals.   . senna (SENOKOT) 8.6 MG TABS tablet Take 1  tablet (8.6 mg total) by mouth at bedtime as needed for mild constipation.   Marland Kitchen aspirin 81 MG EC tablet Take 1 tablet (81 mg total) by mouth daily. (Patient not taking: Reported on 10/12/2017)   . bacitracin ointment Apply topically 2 (two) times daily. (Patient not taking: Reported on 10/12/2017)   . cephALEXin (KEFLEX) 500 MG capsule Take 1 capsule (500 mg total) by mouth 2 (two) times daily. (Patient not taking: Reported on 10/12/2017)   . HYDROcodone-acetaminophen (NORCO/VICODIN) 5-325 MG tablet Take 1-2 tablets  by mouth every 6 (six) hours as needed for moderate pain ((score 4 to 6)). (Patient not taking: Reported on 10/12/2017)    No facility-administered encounter medications on file as of 10/12/2017.     Functional Status:   In your present state of health, do you have any difficulty performing the following activities: 07/09/2017 06/14/2017  Hearing? N N  Vision? Y N  Comment Reading  -  Difficulty concentrating or making decisions? N N  Walking or climbing stairs? Y Y  Dressing or bathing? N N  Doing errands, shopping? Y -  Comment daughter or sister provide transportation -  Conservation officer, nature and eating ? Y -  Comment Neuropathy -  Using the Toilet? N -  In the past six months, have you accidently leaked urine? N -  Do you have problems with loss of bowel control? Y -  Managing your Medications? N -  Managing your Finances? N -  Housekeeping or managing your Housekeeping? N -  Some recent data might be hidden    Fall/Depression Screening:    Fall Risk  08/31/2017 07/21/2017 07/09/2017  Falls in the past year? Yes Yes Yes  Number falls in past yr: 1 - 2 or more  Injury with Fall? Yes No No  Risk Factor Category  - High Fall Risk High Fall Risk  Risk for fall due to : - - History of fall(s)  Follow up Follow up appointment - Education provided;Falls prevention discussed   PHQ 2/9 Scores 08/31/2017 07/21/2017 07/09/2017 07/01/2017 06/10/2017 02/13/2017 11/11/2016  PHQ - 2 Score 0 0 0 0 0 0 0  PHQ- 9 Score 0 - - 0 0 0 1    Assessment:    Met with member at scheduled time.  He report that he has had a rough time dealing with some things and has entertained the "wrong people."  He admits to increased alcohol consumption, decreased po intake, non-adherence to medications and cocaine use.  This care manager offered assistance through LCSW for programs, he declines stating "I can take care of it."  He does not elaborate on problem, but state he has told his friends to stay away from his home.  He state he  feel he is now back on the right track.  He is aware that his blood pressure is low today as well as oxygen saturations.  He denies dizziness or fever, but does complain of some blurred vision.  Along with his cough, he has noticed an increase in skin ulcers, relating them to sun exposure.    Call placed to primary MD office, symptoms and vitals provide to [name], office visit scheduled for 5/8.  He is also advised to obtain batteries for his home machine and check blood pressure again later today as well as tomorrow.  He verbalizes understanding.  Advised to contact this care manager or seek emergency attention if symptoms increase.  Plan:   Will follow up with member within the  next 2 business days regarding blood pressure.  THN CM Care Plan Problem One     Most Recent Value  Care Plan Problem One  Risk for infection as evidenced by persistent productive cough and multiple skin lesions  Role Documenting the Problem One  Care Management [place] for Problem One  Active  THN Long Term Goal   Member will not report any signs/symptoms of infection over the next 31 days  THN Long Term Goal Start Date  10/12/17  Interventions for Problem One Long Term Goal  Educated on signs/symptoms of infections.  Advised of appropriate oral fluid intake in effort to prevent dehydration  THN CM Short Term Goal #1   Member will attend appointment with primary MD office this week  THN CM Short Term Goal #1 Start Date  10/12/17  Interventions for Short Term Goal #1  Call placed to PCP office to request sooner appointment .  Advised of importance of attending appointment in effort to treat skin lesions as well as persistent cough  THN CM Short Term Goal #2   Member will report decrease in cough and sputum production within the next 4 weeks  THN CM Short Term Goal #2 Start Date  10/12/17  Interventions for Short Term Goal #2  Member educated on potential of having antibiotic ordered.  Advised of  importance of taking as prescribed until all gone to increase chances of recovery  THN CM Short Term Goal #3  Member will report decrease in skin ulcers over the next 4 weeks  THN CM Short Term Goal #3 Start Date  10/12/17  Interventions for Short Tern Goal #3  Member educated on interventions to decrease exposure to sun: wear long sleeves and pants, limiting time outside, using sunscreen     [name], Therapist, sports, MSN [place] Manager [phone]

## 2017-10-13 ENCOUNTER — Telehealth: Payer: Self-pay

## 2017-10-13 ENCOUNTER — Other Ambulatory Visit: Payer: Self-pay | Admitting: *Deleted

## 2017-10-13 NOTE — Patient Outreach (Signed)
[facility]) Care Management  10/13/2017  Melvin [name] Jan 10, 1961 [id number]   Call received from member for update on condition today.  He report he has been drinking more water, state blood pressure last night was 135/86, today 129/87.  He state his eyes are no longer blurry, denies any other concerns.  He is reminded of follow up appointment tomorrow.  Voice message received from [name] for update on member's condition.  Call placed back to Ms. [name], voice message left with update.    [name], [place], MSN [place] [phone]

## 2017-10-13 NOTE — Telephone Encounter (Signed)
Message received from [facility], RN/THN.   She noted that the patient called her today and informed her that his BP last night was 135/86 and this morning it was 129/87. He has increased his intake of water. No blurriness of his eyes and not other symptoms reported He stated that he would be at his appointment at [facility] tomorrow - 10/14/17.

## 2017-10-13 NOTE — Telephone Encounter (Signed)
Call placed to [facility], RN/THN to inquire is she has an update on the patient's BP and how he is feeling.  Voicemail message left requesting a call back to # [phone].  Call placed to the patient # [phone] (M) and a HIPAA compliant voicemail message was left requesting a call back to # [phone]

## 2017-10-14 ENCOUNTER — Ambulatory Visit: Payer: Medicare HMO | Attending: Family Medicine | Admitting: Family Medicine

## 2017-10-14 ENCOUNTER — Encounter: Payer: Self-pay | Admitting: Family Medicine

## 2017-10-14 VITALS — BP 100/63 | HR 86 | Temp 97.5°F | Ht 68.0 in | Wt 165.4 lb

## 2017-10-14 DIAGNOSIS — I110 Hypertensive heart disease with heart failure: Secondary | ICD-10-CM | POA: Insufficient documentation

## 2017-10-14 DIAGNOSIS — Z9114 Patient's other noncompliance with medication regimen: Secondary | ICD-10-CM | POA: Diagnosis not present

## 2017-10-14 DIAGNOSIS — Z7982 Long term (current) use of aspirin: Secondary | ICD-10-CM | POA: Insufficient documentation

## 2017-10-14 DIAGNOSIS — I959 Hypotension, unspecified: Secondary | ICD-10-CM | POA: Diagnosis not present

## 2017-10-14 DIAGNOSIS — Z79899 Other long term (current) drug therapy: Secondary | ICD-10-CM | POA: Diagnosis not present

## 2017-10-14 DIAGNOSIS — R42 Dizziness and giddiness: Secondary | ICD-10-CM | POA: Insufficient documentation

## 2017-10-14 DIAGNOSIS — E1149 Type 2 diabetes mellitus with other diabetic neurological complication: Secondary | ICD-10-CM

## 2017-10-14 DIAGNOSIS — R05 Cough: Secondary | ICD-10-CM | POA: Diagnosis not present

## 2017-10-14 DIAGNOSIS — K219 Gastro-esophageal reflux disease without esophagitis: Secondary | ICD-10-CM | POA: Insufficient documentation

## 2017-10-14 DIAGNOSIS — Z96642 Presence of left artificial hip joint: Secondary | ICD-10-CM | POA: Insufficient documentation

## 2017-10-14 DIAGNOSIS — E119 Type 2 diabetes mellitus without complications: Secondary | ICD-10-CM

## 2017-10-14 DIAGNOSIS — E1140 Type 2 diabetes mellitus with diabetic neuropathy, unspecified: Secondary | ICD-10-CM | POA: Diagnosis not present

## 2017-10-14 DIAGNOSIS — R197 Diarrhea, unspecified: Secondary | ICD-10-CM | POA: Diagnosis not present

## 2017-10-14 DIAGNOSIS — I509 Heart failure, unspecified: Secondary | ICD-10-CM | POA: Insufficient documentation

## 2017-10-14 DIAGNOSIS — I10 Essential (primary) hypertension: Secondary | ICD-10-CM | POA: Diagnosis present

## 2017-10-14 DIAGNOSIS — Z794 Long term (current) use of insulin: Secondary | ICD-10-CM | POA: Diagnosis not present

## 2017-10-14 DIAGNOSIS — Z8661 Personal history of infections of the central nervous system: Secondary | ICD-10-CM | POA: Insufficient documentation

## 2017-10-14 DIAGNOSIS — N179 Acute kidney failure, unspecified: Secondary | ICD-10-CM | POA: Diagnosis not present

## 2017-10-14 DIAGNOSIS — E7800 Pure hypercholesterolemia, unspecified: Secondary | ICD-10-CM | POA: Diagnosis not present

## 2017-10-14 DIAGNOSIS — R059 Cough, unspecified: Secondary | ICD-10-CM

## 2017-10-14 LAB — GLUCOSE, POCT (MANUAL RESULT ENTRY): POC GLUCOSE: 142 mg/dL — AB (ref 70–99)

## 2017-10-14 LAB — POCT GLYCOSYLATED HEMOGLOBIN (HGB A1C): HEMOGLOBIN A1C: 9.9

## 2017-10-14 MED ORDER — GABAPENTIN 300 MG PO CAPS: 600.0000 mg | ORAL_CAPSULE | Freq: Two times a day (BID) | ORAL | 3 refills | Status: AC

## 2017-10-14 MED ORDER — MIDODRINE HCL 5 MG PO TABS
5.0000 mg | ORAL_TABLET | Freq: Three times a day (TID) | ORAL | 3 refills | Status: AC
Start: 2017-10-14 — End: ?

## 2017-10-14 MED ORDER — BENZONATATE 100 MG PO CAPS: 100.0000 mg | ORAL_CAPSULE | Freq: Two times a day (BID) | ORAL | 0 refills | Status: AC | PRN

## 2017-10-14 MED ORDER — INSULIN GLARGINE 100 UNIT/ML ~~LOC~~ SOLN: 30.0000 [IU] | Freq: Every day | SUBCUTANEOUS | 3 refills | Status: DC

## 2017-10-14 NOTE — Progress Notes (Signed)
Patient BP has been running low.

## 2017-10-14 NOTE — Telephone Encounter (Signed)
Noted  
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
## 2017-10-15 ENCOUNTER — Encounter: Payer: Self-pay | Admitting: Family Medicine

## 2017-10-15 ENCOUNTER — Telehealth: Payer: Self-pay

## 2017-10-15 ENCOUNTER — Other Ambulatory Visit: Payer: Self-pay | Admitting: Family Medicine

## 2017-10-15 DIAGNOSIS — N183 Chronic kidney disease, stage 3 unspecified: Secondary | ICD-10-CM

## 2017-10-15 LAB — CMP14+EGFR
A/G RATIO: 0.7 — AB (ref 1.2–2.2)
ALK PHOS: 79 IU/L (ref 39–117)
ALT: 6 IU/L (ref 0–44)
AST: 18 IU/L (ref 0–40)
Albumin: 2.7 g/dL — ABNORMAL LOW (ref 3.5–5.5)
BUN/Creatinine Ratio: 13 (ref 9–20)
BUN: 33 mg/dL — ABNORMAL HIGH (ref 6–24)
Bilirubin Total: 0.3 mg/dL (ref 0.0–1.2)
CALCIUM: 8.6 mg/dL — AB (ref 8.7–10.2)
CO2: 19 mmol/L — ABNORMAL LOW (ref 20–29)
Chloride: 96 mmol/L (ref 96–106)
Creatinine, Ser: 2.46 mg/dL — ABNORMAL HIGH (ref 0.76–1.27)
GFR calc Af Amer: 32 mL/min/{1.73_m2} — ABNORMAL LOW (ref >59)
GFR, EST NON AFRICAN AMERICAN: 28 mL/min/{1.73_m2} — AB (ref >59)
GLOBULIN, TOTAL: 4 g/dL (ref 1.5–4.5)
Glucose: 150 mg/dL — ABNORMAL HIGH (ref 65–99)
POTASSIUM: 4.6 mmol/L (ref 3.5–5.2)
SODIUM: 131 mmol/L — AB (ref 134–144)
Total Protein: 6.7 g/dL (ref 6.0–8.5)

## 2017-10-15 NOTE — Telephone Encounter (Signed)
-----   Message from Charlott Rakes, MD sent at 10/15/2017  1:44 PM EDT ----- Labs did reveal abnormal kidney function.  I have referred him to nephrology for follow-up.

## 2017-10-15 NOTE — Telephone Encounter (Signed)
Patient was called and informed of lab results. 

## 2017-10-16 ENCOUNTER — Encounter: Payer: Self-pay | Admitting: Gastroenterology

## 2017-10-16 ENCOUNTER — Other Ambulatory Visit: Payer: Medicare HMO

## 2017-10-16 ENCOUNTER — Ambulatory Visit (INDEPENDENT_AMBULATORY_CARE_PROVIDER_SITE_OTHER): Payer: Medicare HMO | Admitting: Gastroenterology

## 2017-10-16 VITALS — BP 116/60 | HR 92 | Ht 66.25 in | Wt 160.4 lb

## 2017-10-16 DIAGNOSIS — R159 Full incontinence of feces: Secondary | ICD-10-CM | POA: Diagnosis not present

## 2017-10-16 DIAGNOSIS — R197 Diarrhea, unspecified: Secondary | ICD-10-CM | POA: Diagnosis not present

## 2017-10-16 MED ORDER — PANCRELIPASE (LIP-PROT-AMYL) 40000-126000 UNITS PO CPEP: 1.0000 | ORAL_CAPSULE | Freq: Three times a day (TID) | ORAL | 0 refills | Status: AC

## 2017-10-16 MED ORDER — COLESTIPOL HCL 1 G PO TABS: 1.0000 g | ORAL_TABLET | Freq: Two times a day (BID) | ORAL | 3 refills | Status: AC

## 2017-10-16 NOTE — Progress Notes (Signed)
HPI :  57 year old male here for a follow-up visit. He has a history of heart failure, poorly controlled diabetes with significant neuropathy, history of polysubstance abuse with imaging evidence of chronic pancreatitis in the past. I met him for a screening colonoscopy which she had in September 2018. That exam was remarkable for diverticulosis and 3 small adenomas. The colon parenchyma was otherwise normal at the time. Biopsies not taken for microscopic colitis as he did not diarrhea at the time or I was not aware of it. He reports loose stools which has been going on for several months now.  He's had fat noted in the stool on stool samples in 2018. He was admitted earlier this year for osteomyelitis for which he required IV antibiotics. He most recently also had antibiotics for a wound on his arm. We have referred him for C. Difficile testing with this was not done that I can see in the past few months. He was placed on Zenpep and taking 25,000 units with each meal, and immodium scheduled once to twice daily. Despite these measures he continues to have loose stools. He states he has severe urgency after he eats anything. Also with some nocturnal symptoms he has on occasion. He can get urgency and wake up with incontinence once or twice a week, he states he has tried avoiding eating late at night which has helped with this. Overall he says he has about 3-4 bowel movements per day, most often postprandial setting.  Most recently he was admitted in February for displaced fracture of the left femur, and underwent left ip replacement  He's also had issues with hypotension, placed on midodrine per PCP and has been found to have some persistent kidney dysfunction which is relatively new. He is waiting consultation with nephrology.    Past Medical History:  Diagnosis Date  . Anemia   . Angina   . CHF (congestive heart failure) ([place])   . Diabetes mellitus   . Diverticulosis   . GERD (gastroesophageal  reflux disease)   . Hyperlipidemia   . Meningitis   . Neuromuscular disorder ([place])    diabetic neuropathy  . Pancreatitis   . Pneumonia      Past Surgical History:  Procedure Laterality Date  . ERCP  08/02/2011   Procedure: ENDOSCOPIC RETROGRADE CHOLANGIOPANCREATOGRAPHY (ERCP);  Surgeon: Beryle Beams, MD;  Location: [facility] ENDOSCOPY;  Service: Endoscopy;  Laterality: N/A;  . ERCP  08/22/2011   Procedure: ENDOSCOPIC RETROGRADE CHOLANGIOPANCREATOGRAPHY (ERCP);  Surgeon: Beryle Beams, MD;  Location: [facility];  Service: Endoscopy;  Laterality: N/A;  . I&D EXTREMITY Left 06/10/2017   Procedure: IRRIGATION AND DEBRIDEMENT EXTREMITY, left wrist;  Surgeon: Milly Jakob, MD;  Location: [place];  Service: Orthopedics;  Laterality: Left;  . TONSILLECTOMY AND ADENOIDECTOMY     "as a kid"  . TOTAL HIP ARTHROPLASTY Left 08/01/2017   Procedure: LEFT TOTAL HIP REPLACEMENT ANTERIOR APPROACH;  Surgeon: Mcarthur Rossetti, MD;  Location: [place];  Service: Orthopedics;  Laterality: Left;  . TYMPANOSTOMY TUBE PLACEMENT     Family History  Problem Relation Age of Onset  . Heart failure Mother   . Diabetes Mother   . Hypertension Mother   . Heart disease Father   . Heart failure Father   . Colon cancer Neg Hx   . Colon polyps Neg Hx   . Esophageal cancer Neg Hx   . Rectal cancer Neg Hx   . Stomach cancer Neg Hx    Social History  Tobacco Use  . Smoking status: Never Smoker  . Smokeless tobacco: Never Used  Substance Use Topics  . Alcohol use: Yes    Alcohol/week: 4.2 oz    Types: 3 Cans of beer, 4 Shots of liquor per week    Comment: couple times weekly  . Drug use: Yes    Types: Cocaine    Comment: pt reports last drug use X5 year ago    Current Outpatient Medications  Medication Sig Dispense Refill  . ACCU-CHEK FASTCLIX LANCETS MISC TEST BLOOD GLUCOSE THREE TIMES DAILY AS DIRECTED 306 each 12  . ACCU-CHEK SMARTVIEW test strip USE AS DIRECTED TO TEST BLOOD GLUCOSE THREE TIMES  DAILY 300 each 12  . ACCU-CHEK SOFTCLIX LANCETS lancets Use as instructed for 3 times daily testing of blood sugar. E11.9 100 each 12  . Alcohol Swabs (B-D SINGLE USE SWABS REGULAR) PADS USE AS DIRECTED THREE TIMES DAILY  AND AT BEDTIME 400 each 3  . aspirin 81 MG EC tablet Take 1 tablet (81 mg total) by mouth daily. 30 tablet 3  . atorvastatin (LIPITOR) 10 MG tablet TAKE 1 TABLET (10 MG)  BY MOUTH DAILY AT BEDTIME. 90 tablet 1  . bacitracin ointment Apply topically 2 (two) times daily. 120 g 2  . benzonatate (TESSALON) 100 MG capsule Take 1 capsule (100 mg total) by mouth 2 (two) times daily as needed for cough. 20 capsule 0  . Blood Glucose Monitoring Suppl (ACCU-CHEK NANO SMARTVIEW) w/Device KIT Use as directed for 3 times daily testing of blood glucose. 1 kit 0  . gabapentin (NEURONTIN) 300 MG capsule Take 2 capsules (600 mg total) by mouth 2 (two) times daily. 120 capsule 3  . insulin aspart (NOVOLOG) 100 UNIT/ML injection 150-200, give 3 units 201-250 give 5 units 251-300 give 7 units 301-350 give 9 units 351-400 give 12 units (Patient taking differently: Inject 2-5 Units into the skin 3 (three) times daily with meals. ) 3 vial 3  . insulin glargine (LANTUS) 100 UNIT/ML injection Inject 0.3 mLs (30 Units total) into the skin at bedtime. 10 mL 3  . Insulin Syringe-Needle U-100 (B-D INS SYRINGE 0.5CC/31GX5/16) 31G X 5/16" 0.5 ML MISC USE TO INJECT INSULIN 3 TIMES A DAY AND AT BEDTIME 360 each 3  . Lancet Devices (ACCU-CHEK SOFTCLIX) lancets Use as instructed for 3 times daily testing of blood sugar. E11.9 1 each 0  . midodrine (PROAMATINE) 5 MG tablet Take 1 tablet (5 mg total) by mouth 3 (three) times daily with meals. 90 tablet 3  . naproxen (NAPROSYN) 500 MG tablet TAKE 1 TABLET (500 MG TOTAL) BY MOUTH 2 (TWO) TIMES DAILY WITH A MEAL. (Patient taking differently: Take 500 mg by mouth 2 (two) times daily as needed (pain). ) 60 tablet 0   No current facility-administered medications for this  visit.    No Known Allergies   Review of Systems: All systems reviewed and negative except where noted in HPI.   Lab Results  Component Value Date   WBC 11.0 (H) 10/04/2017   HGB 12.1 (L) 10/04/2017   HCT 35.8 (L) 10/04/2017   MCV 91.1 10/04/2017   PLT 151 10/04/2017    Lab Results  Component Value Date   CREATININE 2.46 (H) 10/14/2017   BUN 33 (H) 10/14/2017   NA 131 (L) 10/14/2017   K 4.6 10/14/2017   CL 96 10/14/2017   CO2 19 (L) 10/14/2017    Lab Results  Component Value Date   ALT 6 10/14/2017  AST 18 10/14/2017   GGT 375 (H) 02/13/2017   ALKPHOS 79 10/14/2017   BILITOT 0.3 10/14/2017     Physical Exam: BP 116/60 (BP Location: Left Arm, Patient Position: Sitting, Cuff Size: Normal)   Pulse 92   Ht 5' 6.25" (1.683 m) Comment: height measured dwithout shoes  Wt 160 lb 6 oz (72.7 kg)   BMI 25.69 kg/m  Constitutional: Pleasant, male in no acute distress. HEENT: Normocephalic and atraumatic. Conjunctivae are normal. No scleral icterus. Neck supple.  Cardiovascular: Normal rate, regular rhythm.  Pulmonary/chest: Effort normal and breath sounds normal. No wheezing, rales or rhonchi. Abdominal: Soft, nondistended, nontender. There are no masses palpable. No hepatomegaly. Extremities: no edema, healing wound on R arm Lymphadenopathy: No cervical adenopathy noted. Neurological: Alert and oriented to person place and time. Skin: Skin is warm and dry. No rashes noted. Psychiatric: Normal mood and affect. Behavior is normal.   ASSESSMENT AND PLAN: 57 year old male with multiple medical problems, history of chronic pancreatitis, history of multiple bones/wound-related infections over the past few months on multiple courses of antibiotics, uncontrolled diabetes, presenting with fecal urgency and diarrhea/incontinence over the past few months.  He needs to be ruled out for C. Difficile given his multiple courses of antibiotics. I don't see any evidence of this being  done yet, despite having ordered in the past for him at his last office visit with [place]. We'll send GI pathogen panel at this time. Otherwise given his postprandial diarrhea with positive fecal fat, and CT imaging consistent with chronic pancreatitis in the past, he was placed on Zenpep to treat suspected pancreatic insufficiency. While this has not helped him yet, he is under dosed for his weight. We will increase to 40,000 units with each meal, and can titrate up if needed moving forward. He is using some Imodium which hasn't helped too much as far, will add Colestid empirically 1 g twice a day to see if this helps. If his stool tests positive for infection will address that. Otherwise if no better in 2 weeks asked him to call me back for reassessment and we'll try other options. He will proceed with nephrology consultation regarding his chronic kidney disease.  [place] Cellar, MD [facility]

## 2017-10-16 NOTE — Patient Instructions (Addendum)
If you are age 59 or older, your body mass index should be between 23-30. Your Body mass index is 25.69 kg/m. If this is out of the aforementioned range listed, please consider follow up with your Primary Care Provider.  If you are age 2 or younger, your body mass index should be between 19-25. Your Body mass index is 25.69 kg/m. If this is out of the aformentioned range listed, please consider follow up with your Primary Care Provider.   We have given you samples of the following medication to take: ZenPep 40,000 USP Take with meals  We have sent the following medications to your pharmacy for you to pick up at your convenience: Colestid: Take twice a day  Continue on Imodium as needed  Please call us in 2 weeks if you are not improved.  Thank you for entrusting me with your care and for choosing [facility], Dr. [place] Cellar

## 2017-10-20 ENCOUNTER — Other Ambulatory Visit: Payer: Medicare HMO

## 2017-10-20 DIAGNOSIS — R159 Full incontinence of feces: Secondary | ICD-10-CM

## 2017-10-20 DIAGNOSIS — R197 Diarrhea, unspecified: Secondary | ICD-10-CM

## 2017-10-22 LAB — GASTROINTESTINAL PATHOGEN PANEL PCR
C. difficile Tox A/B, PCR: NOT DETECTED
CRYPTOSPORIDIUM, PCR: NOT DETECTED
Campylobacter, PCR: NOT DETECTED
E coli (ETEC) LT/ST PCR: NOT DETECTED
E coli (STEC) stx1/stx2, PCR: NOT DETECTED
E coli 0157, PCR: NOT DETECTED
Giardia lamblia, PCR: NOT DETECTED
NOROVIRUS, PCR: NOT DETECTED
ROTAVIRUS, PCR: NOT DETECTED
Salmonella, PCR: NOT DETECTED
Shigella, PCR: NOT DETECTED

## 2017-10-28 ENCOUNTER — Telehealth: Payer: Self-pay | Admitting: Gastroenterology

## 2017-10-28 NOTE — Telephone Encounter (Signed)
Called pt and LM that I am leaving 2 boxes of Zenpep at the front for him to pick up.

## 2017-11-05 ENCOUNTER — Encounter: Payer: Self-pay | Admitting: Family Medicine

## 2017-11-05 ENCOUNTER — Encounter

## 2017-11-05 ENCOUNTER — Ambulatory Visit: Payer: Medicare HMO | Attending: Family Medicine | Admitting: Family Medicine

## 2017-11-05 ENCOUNTER — Ambulatory Visit: Payer: Medicare HMO | Admitting: Family Medicine

## 2017-11-05 VITALS — BP 124/75 | HR 94 | Temp 97.7°F | Ht 66.0 in | Wt 157.6 lb

## 2017-11-05 DIAGNOSIS — I130 Hypertensive heart and chronic kidney disease with heart failure and stage 1 through stage 4 chronic kidney disease, or unspecified chronic kidney disease: Secondary | ICD-10-CM | POA: Insufficient documentation

## 2017-11-05 DIAGNOSIS — E785 Hyperlipidemia, unspecified: Secondary | ICD-10-CM | POA: Insufficient documentation

## 2017-11-05 DIAGNOSIS — Z09 Encounter for follow-up examination after completed treatment for conditions other than malignant neoplasm: Secondary | ICD-10-CM | POA: Diagnosis not present

## 2017-11-05 DIAGNOSIS — E1140 Type 2 diabetes mellitus with diabetic neuropathy, unspecified: Secondary | ICD-10-CM | POA: Insufficient documentation

## 2017-11-05 DIAGNOSIS — L0103 Bullous impetigo: Secondary | ICD-10-CM | POA: Diagnosis not present

## 2017-11-05 DIAGNOSIS — E1169 Type 2 diabetes mellitus with other specified complication: Secondary | ICD-10-CM | POA: Insufficient documentation

## 2017-11-05 DIAGNOSIS — Z7982 Long term (current) use of aspirin: Secondary | ICD-10-CM | POA: Diagnosis not present

## 2017-11-05 DIAGNOSIS — Z79899 Other long term (current) drug therapy: Secondary | ICD-10-CM | POA: Insufficient documentation

## 2017-11-05 DIAGNOSIS — I509 Heart failure, unspecified: Secondary | ICD-10-CM | POA: Diagnosis not present

## 2017-11-05 DIAGNOSIS — K860 Alcohol-induced chronic pancreatitis: Secondary | ICD-10-CM | POA: Diagnosis not present

## 2017-11-05 DIAGNOSIS — K8689 Other specified diseases of pancreas: Secondary | ICD-10-CM | POA: Diagnosis not present

## 2017-11-05 DIAGNOSIS — Z794 Long term (current) use of insulin: Secondary | ICD-10-CM | POA: Diagnosis not present

## 2017-11-05 DIAGNOSIS — E1122 Type 2 diabetes mellitus with diabetic chronic kidney disease: Secondary | ICD-10-CM | POA: Diagnosis not present

## 2017-11-05 DIAGNOSIS — E1149 Type 2 diabetes mellitus with other diabetic neurological complication: Secondary | ICD-10-CM

## 2017-11-05 DIAGNOSIS — N183 Chronic kidney disease, stage 3 unspecified: Secondary | ICD-10-CM

## 2017-11-05 DIAGNOSIS — Z9889 Other specified postprocedural states: Secondary | ICD-10-CM | POA: Insufficient documentation

## 2017-11-05 DIAGNOSIS — Z96642 Presence of left artificial hip joint: Secondary | ICD-10-CM | POA: Diagnosis not present

## 2017-11-05 DIAGNOSIS — I950 Idiopathic hypotension: Secondary | ICD-10-CM | POA: Insufficient documentation

## 2017-11-05 DIAGNOSIS — Z8661 Personal history of infections of the central nervous system: Secondary | ICD-10-CM | POA: Insufficient documentation

## 2017-11-05 DIAGNOSIS — K219 Gastro-esophageal reflux disease without esophagitis: Secondary | ICD-10-CM | POA: Diagnosis not present

## 2017-11-05 DIAGNOSIS — Z791 Long term (current) use of non-steroidal anti-inflammatories (NSAID): Secondary | ICD-10-CM | POA: Insufficient documentation

## 2017-11-05 LAB — GLUCOSE, POCT (MANUAL RESULT ENTRY): POC Glucose: 70 mg/dl (ref 70–99)

## 2017-11-05 MED ORDER — INSULIN ASPART 100 UNIT/ML ~~LOC~~ SOLN: SUBCUTANEOUS | 3 refills | Status: AC

## 2017-11-05 MED ORDER — CEPHALEXIN 500 MG PO CAPS: 500.0000 mg | ORAL_CAPSULE | Freq: Four times a day (QID) | ORAL | 0 refills | Status: AC

## 2017-11-05 MED ORDER — INSULIN GLARGINE 100 UNIT/ML ~~LOC~~ SOLN: 25.0000 [IU] | Freq: Every day | SUBCUTANEOUS | 3 refills | Status: AC

## 2017-11-05 NOTE — Progress Notes (Signed)
Subjective:  Patient ID: Melvin [name], male    DOB: 10-15-1960  Age: 58 y.o. MRN: [id number]  CC: Hospitalization Follow-up   HPI Melvin [name]  is a 57 year old male with history of type 2 diabetes mellitus (A1c 9.9), diabetic neuropathy, chronic alcoholic pancreatitis, hyperlipidemia, hypertension, hospitalization for Osteomyelitis of the left ulnar in 06/2017, left total hip replacement secondary to left base of neck femur fracture secondary to a fall here for follow-up visit. He was commenced on Midodrine at his last visit due to symptomatic hypotension and associated falls and his blood pressure is doing better. He has had low fasting sugars in the 70s which led his daughter to varying the dose of his Lantus from 20 to 25 and at other times 30 units. He continue to have skin lesions which are worsening and he thinks are as a result of his exposure to the sun. They start out as large blister which pop leaving underlying ulceration and he has new ones on his fingers and wrists. He denies fever. His chronic diarrhea has improved as he was commenced on Colestid in addition to Zenpep which he was previously taking at his last GI visit on 10/16/17.  Past Medical History:  Diagnosis Date  . Anemia   . Angina   . CHF (congestive heart failure) ([place])   . Diabetes mellitus   . Diverticulosis   . GERD (gastroesophageal reflux disease)   . Hyperlipidemia   . Meningitis   . Neuromuscular disorder ([place])    diabetic neuropathy  . Pancreatitis   . Pneumonia     Past Surgical History:  Procedure Laterality Date  . ERCP  08/02/2011   Procedure: ENDOSCOPIC RETROGRADE CHOLANGIOPANCREATOGRAPHY (ERCP);  Surgeon: Beryle Beams, MD;  Location: [facility] ENDOSCOPY;  Service: Endoscopy;  Laterality: N/A;  . ERCP  08/22/2011   Procedure: ENDOSCOPIC RETROGRADE CHOLANGIOPANCREATOGRAPHY (ERCP);  Surgeon: Beryle Beams, MD;  Location: [facility];  Service: Endoscopy;  Laterality: N/A;  . I&D  EXTREMITY Left 06/10/2017   Procedure: IRRIGATION AND DEBRIDEMENT EXTREMITY, left wrist;  Surgeon: Milly Jakob, MD;  Location: [place];  Service: Orthopedics;  Laterality: Left;  . TONSILLECTOMY AND ADENOIDECTOMY     "as a kid"  . TOTAL HIP ARTHROPLASTY Left 08/01/2017   Procedure: LEFT TOTAL HIP REPLACEMENT ANTERIOR APPROACH;  Surgeon: Mcarthur Rossetti, MD;  Location: [place];  Service: Orthopedics;  Laterality: Left;  . TYMPANOSTOMY TUBE PLACEMENT      No Known Allergies     Outpatient Medications Prior to Visit  Medication Sig Dispense Refill  . ACCU-CHEK FASTCLIX LANCETS MISC TEST BLOOD GLUCOSE THREE TIMES DAILY AS DIRECTED 306 each 12  . ACCU-CHEK SMARTVIEW test strip USE AS DIRECTED TO TEST BLOOD GLUCOSE THREE TIMES DAILY 300 each 12  . ACCU-CHEK SOFTCLIX LANCETS lancets Use as instructed for 3 times daily testing of blood sugar. E11.9 100 each 12  . Alcohol Swabs (B-D SINGLE USE SWABS REGULAR) PADS USE AS DIRECTED THREE TIMES DAILY  AND AT BEDTIME 400 each 3  . aspirin 81 MG EC tablet Take 1 tablet (81 mg total) by mouth daily. 30 tablet 3  . atorvastatin (LIPITOR) 10 MG tablet TAKE 1 TABLET (10 MG)  BY MOUTH DAILY AT BEDTIME. 90 tablet 1  . bacitracin ointment Apply topically 2 (two) times daily. 120 g 2  . Blood Glucose Monitoring Suppl (ACCU-CHEK NANO SMARTVIEW) w/Device KIT Use as directed for 3 times daily testing of blood glucose. 1 kit 0  . colestipol (  COLESTID) 1 g tablet Take 1 tablet (1 g total) by mouth 2 (two) times daily. 60 tablet 3  . gabapentin (NEURONTIN) 300 MG capsule Take 2 capsules (600 mg total) by mouth 2 (two) times daily. 120 capsule 3  . Insulin Syringe-Needle U-100 (B-D INS SYRINGE 0.5CC/31GX5/16) 31G X 5/16" 0.5 ML MISC USE TO INJECT INSULIN 3 TIMES A DAY AND AT BEDTIME 360 each 3  . Lancet Devices (ACCU-CHEK SOFTCLIX) lancets Use as instructed for 3 times daily testing of blood sugar. E11.9 1 each 0  . midodrine (PROAMATINE) 5 MG tablet Take 1 tablet  (5 mg total) by mouth 3 (three) times daily with meals. 90 tablet 3  . naproxen (NAPROSYN) 500 MG tablet TAKE 1 TABLET (500 MG TOTAL) BY MOUTH 2 (TWO) TIMES DAILY WITH A MEAL. (Patient taking differently: Take 500 mg by mouth 2 (two) times daily as needed (pain). ) 60 tablet 0  . Pancrelipase, Lip-Prot-Amyl, (ZENPEP) 40000-126000 units CPEP Take 1 capsule by mouth 3 (three) times daily with meals. 36 capsule 0  . insulin aspart (NOVOLOG) 100 UNIT/ML injection 150-200, give 3 units 201-250 give 5 units 251-300 give 7 units 301-350 give 9 units 351-400 give 12 units (Patient taking differently: Inject 2-5 Units into the skin 3 (three) times daily with meals. ) 3 vial 3  . insulin glargine (LANTUS) 100 UNIT/ML injection Inject 0.3 mLs (30 Units total) into the skin at bedtime. 10 mL 3  . benzonatate (TESSALON) 100 MG capsule Take 1 capsule (100 mg total) by mouth 2 (two) times daily as needed for cough. (Patient not taking: Reported on 11/05/2017) 20 capsule 0   No facility-administered medications prior to visit.     ROS Review of Systems  Constitutional: Negative for activity change and appetite change.  HENT: Negative for sinus pressure and sore throat.   Eyes: Negative for visual disturbance.  Respiratory: Negative for cough, chest tightness and shortness of breath.   Cardiovascular: Negative for chest pain and leg swelling.  Gastrointestinal: Negative for abdominal distention, abdominal pain, constipation and diarrhea.  Endocrine: Negative.   Genitourinary: Negative for dysuria.  Musculoskeletal: Negative for joint swelling and myalgias.  Skin: Positive for wound. Negative for rash.  Allergic/Immunologic: Negative.   Neurological: Positive for numbness. Negative for weakness and light-headedness.  Psychiatric/Behavioral: Negative for dysphoric mood and suicidal ideas.    Objective:  BP 124/75   Pulse 94   Temp 97.7 F (36.5 C) (Oral)   Ht 5' 6"  (1.676 m)   Wt 157 lb 9.6 oz (71.5 kg)    SpO2 92%   BMI 25.44 kg/m   BP/Weight 11/05/2017 08/28/252 07/16/621  Systolic BP 762 831 517  Diastolic BP 75 60 63  Wt. (Lbs) 157.6 160.38 165.4  BMI 25.44 25.69 25.15      Physical Exam  Constitutional: He is oriented to person, place, and time. He appears well-developed and well-nourished.  Cardiovascular: Normal rate, normal heart sounds and intact distal pulses.  No murmur heard. Pulmonary/Chest: Effort normal and breath sounds normal. He has no wheezes. He has no rales. He exhibits no tenderness.  Abdominal: Soft. Bowel sounds are normal. He exhibits no distension and no mass. There is no tenderness.  Musculoskeletal: Normal range of motion.  Neurological: He is alert and oriented to person, place, and time.  Skin:  Sores on wrists, fingers,  Healed scabs on knees. Bullous lesion on flexor aspect of right ring finger    Lab Results  Component Value Date  HGBA1C 9.9 10/14/2017    Assessment & Plan:   1. Type 2 diabetes mellitus with other neurologic complication, with long-term current use of insulin (HCC) Uncontrolled with A1c of 9.9 Reduce Lantus to 25 units to prevent hypoglycemia - POCT glucose (manual entry) - insulin glargine (LANTUS) 100 UNIT/ML injection; Inject 0.25 mLs (25 Units total) into the skin at bedtime.  Dispense: 10 mL; Refill: 3 - insulin aspart (NOVOLOG) 100 UNIT/ML injection; 150-200, give 3 units 201-250 give 5 units 251-300 give 7 units 301-350 give 9 units 351-400 give 12 units  Dispense: 3 vial; Refill: 3  2. Bullous impetigo - cephALEXin (KEFLEX) 500 MG capsule; Take 1 capsule (500 mg total) by mouth 4 (four) times daily.  Dispense: 40 capsule; Refill: 0 - Ambulatory referral to Dermatology  3. Pancreatic insufficiency Improved Continue Zenpep and colestid  4. Stage 3 chronic kidney disease ([place]) Referred to Nephrology at last visit  5. Idiopathic hypotension Improved on Midodrine   Meds ordered this encounter  Medications  .  insulin glargine (LANTUS) 100 UNIT/ML injection    Sig: Inject 0.25 mLs (25 Units total) into the skin at bedtime.    Dispense:  10 mL    Refill:  3    Discontinue previous dose  . insulin aspart (NOVOLOG) 100 UNIT/ML injection    Sig: 150-200, give 3 units 201-250 give 5 units 251-300 give 7 units 301-350 give 9 units 351-400 give 12 units    Dispense:  3 vial    Refill:  3  . cephALEXin (KEFLEX) 500 MG capsule    Sig: Take 1 capsule (500 mg total) by mouth 4 (four) times daily.    Dispense:  40 capsule    Refill:  0    Follow-up: Return in about 3 months (around 02/05/2018) for Follow-up of chronic medical conditions.   Charlott Rakes MD

## 2017-11-05 NOTE — Patient Instructions (Signed)
Impetigo, Adult Impetigo is an infection of the skin. It commonly occurs in young children, but it can also occur in adults. The infection causes itchy blisters and sores that produce brownish-yellow fluid. As the fluid dries, it forms a thick, honey-colored crust. These skin changes usually occur on the face but can also affect other areas of the body. Impetigo usually goes away in 7-10 days with treatment. What are the causes? Impetigo is caused by two types of bacteria. It may be caused by staphylococci or streptococci bacteria. These bacteria cause impetigo when they get under the surface of the skin. This often happens after some damage to the skin, such as damage from:  Cuts, scrapes, or scratches.  Insect bites, especially when you scratch the area of a bite.  Chickenpox or other illnesses that cause open skin sores.  Nail biting or chewing.  Impetigo is contagious and can spread easily from one person to another. This may occur through close skin contact or by sharing towels, clothing, or other items with a person who has the infection. What increases the risk? Some things that can increase the risk of getting this infection include:  Playing sports that include skin-to-skin contact with others.  Having a skin condition with open sores.  Having many skin cuts or scrapes.  Living in an area that has high humidity levels.  Having poor hygiene.  Having high levels of staphylococci in your nose.  What are the signs or symptoms? Impetigo usually starts out as small blisters, often on the face. The blisters then break open and turn into tiny sores (lesions) with a yellow crust. In some cases, the blisters cause itching or burning. With scratching, irritation, or lack of treatment, these small lesions may get larger. Scratching can also cause impetigo to spread to other parts of the body. The bacteria can get under the fingernails and spread when you touch another area of your  skin. Other possible symptoms include:  Larger blisters.  Pus.  Swollen lymph glands.  How is this diagnosed? This condition is usually diagnosed during a physical exam. A skin sample or sample of fluid from a blister may be taken for lab tests that involve growing bacteria (culture test). This can help confirm the diagnosis or help determine the best treatment. How is this treated? Mild impetigo can be treated with prescription antibiotic cream. Oral antibiotic medicine may be used in more severe cases. Medicines for itching may also be used. Follow these instructions at home:  Take medicines only as directed by your health care provider.  To help prevent impetigo from spreading to other body areas: ? Keep your fingernails short and clean. ? Do not scratch the blisters or sores. ? Cover infected areas, if necessary, to keep from scratching.  Gently wash the infected areas with antibiotic soap and water.  Soak crusted areas in warm, soapy water using antibiotic soap. ? Gently rub the areas to remove crusts. Do not scrub.  Wash your hands often to avoid spreading this infection.  Stay home until you have used an antibiotic cream for 48 hours (2 days) or an oral antibiotic medicine for 24 hours (1 day). You should only return to work and activities with other people if your skin shows significant improvement. How is this prevented? To keep the infection from spreading:  Stay home until you have used an antibiotic cream for 48 hours or an oral antibiotic for 24 hours.  Wash your hands often.  Do not engage in   skin-to-skin contact with other people while you have still have blisters.  Do not share towels, washcloths, or bedding with others while you have the infection.  Contact a health care provider if:  You develop more blisters or sores despite treatment.  Other family members get sores.  Your skin sores are not improving after 48 hours of treatment.  You have a  fever. Get help right away if:  You see spreading redness or swelling of the skin around your sores.  You see red streaks coming from your sores.  You develop a sore throat. This information is not intended to replace advice given to you by your health care provider. Make sure you discuss any questions you have with your health care provider. Document Released: 06/16/2014 Document Revised: 11/01/2015 Document Reviewed: 05/09/2014 Elsevier Interactive Patient Education  2017 [employer]  

## 2017-11-08 ENCOUNTER — Emergency Department (HOSPITAL_COMMUNITY)
Admission: EM | Admit: 2017-11-08 | Discharge: 2017-12-07 | Disposition: E | Payer: Medicare HMO | Attending: Emergency Medicine | Admitting: Emergency Medicine

## 2017-11-08 ENCOUNTER — Encounter (HOSPITAL_COMMUNITY): Payer: Self-pay | Admitting: Emergency Medicine

## 2017-11-08 DIAGNOSIS — R000 Tachycardia, unspecified: Secondary | ICD-10-CM | POA: Diagnosis not present

## 2017-11-08 DIAGNOSIS — E1165 Type 2 diabetes mellitus with hyperglycemia: Secondary | ICD-10-CM | POA: Diagnosis not present

## 2017-11-08 DIAGNOSIS — R0689 Other abnormalities of breathing: Secondary | ICD-10-CM | POA: Diagnosis not present

## 2017-11-08 DIAGNOSIS — I469 Cardiac arrest, cause unspecified: Secondary | ICD-10-CM | POA: Diagnosis not present

## 2017-11-08 DIAGNOSIS — R402441 Other coma, without documented Glasgow coma scale score, or with partial score reported, in the field [EMT or ambulance]: Secondary | ICD-10-CM | POA: Diagnosis not present

## 2017-11-08 DIAGNOSIS — I499 Cardiac arrhythmia, unspecified: Secondary | ICD-10-CM | POA: Diagnosis not present

## 2017-11-08 MED ORDER — EPINEPHRINE PF 1 MG/10ML IJ SOSY
PREFILLED_SYRINGE | INTRAMUSCULAR | Status: AC | PRN
  Administered 2017-11-08: 1 via INTRAVENOUS
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
## 2017-11-08 NOTE — ED Triage Notes (Signed)
See notes
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
## 2017-11-08 NOTE — ED Provider Notes (Signed)
[facility] EMERGENCY DEPARTMENT Provider Note   CSN: [id number] Arrival date & time: 11/19/2017  1900     History   Chief Complaint Chief Complaint  Patient presents with  . Cardiac Arrest    HPI Melvin [name] is a 57 y.o. male.  HPI Patient comes in brought by EMS after being found in his car unresponsive and pulseless.  Per EMS, patient found to be in PEA and given 5 rounds of epinephrine without any spontaneous circulation return.  Patient with [facility] airway in place.  No known medical history at this time.   History reviewed. No pertinent past medical history.  There are no active problems to display for this patient.   History reviewed. No pertinent surgical history.      Home Medications    Prior to Admission medications   Not on File    Family History History reviewed. No pertinent family history.  Social History Social History   Tobacco Use  . Smoking status: Not on file  Substance Use Topics  . Alcohol use: Not on file  . Drug use: Not on file     Allergies   Patient has no allergy information on record.   Review of Systems Review of Systems  Unable to perform ROS: Patient unresponsive  All other systems reviewed and are negative.    Physical Exam Updated Vital Signs Wt 86.2 kg (190 lb)   Physical Exam  Constitutional: He appears well-developed and well-nourished.  HENT:  Head: Normocephalic and atraumatic.  Eyes:  Fixed and dilated  Neck: Neck supple.  Cardiovascular:  PEA followed by asystole  Pulmonary/Chest: Breath sounds normal. No respiratory distress.  [facility] airway in place with assisted ventilation no spontaneous breathing  Abdominal: Soft. There is no tenderness.  Musculoskeletal: He exhibits no edema.  Neurological:  Unresponsive  Skin: Skin is warm and dry.  Psychiatric:  Unresponsive  Nursing note and vitals reviewed.    ED Treatments / Results  Labs (all labs ordered are listed, but only  abnormal results are displayed) Labs Reviewed - No data to display  EKG None  Radiology No results found.  Procedures Procedure Name: Intubation Date/Time: 12/04/2017 7:07 PM Performed by: Chapman Moss, MD Pre-anesthesia Checklist: Suction available and Patient being monitored Induction Type: Rapid sequence Ventilation: Mask ventilation without difficulty Laryngoscope Size: Mac and 3 Grade View: Grade II Tube size: 7.5 mm Number of attempts: 1 Airway Equipment and Method: Rigid stylet Placement Confirmation: ETT inserted through vocal cords under direct vision,  CO2 detector and Breath sounds checked- equal and bilateral Secured at: 23 cm Tube secured with: ETT holder Dental Injury: Teeth and Oropharynx as per pre-operative assessment       (including critical care time)  Medications Ordered in ED Medications  EPINEPHrine (ADRENALIN) 1 MG/10ML injection (1 Syringe Intravenous Given 11/23/2017 1903)     Initial Impression / Assessment and Plan / ED Course  I have reviewed the triage vital signs and the nursing notes.  Pertinent labs & imaging results that were available during my care of the patient were reviewed by me and considered in my medical decision making (see chart for details).     CPR was continued for a total of approximately 40 minutes without any return of spontaneous circulation.  Patient was intubated and given additional epi after arrival, however efforts were terminated and time of death was declared by myself at 1904 after patient was found to be in asystole with no cardiac activity via ultrasound.  Family had arrived at that time and they were made aware.  Family state patient had a history of diabetes, hypotension, pancreatitis, recent bowel incontinence, EtOH abuse, "some kidney issue", and previous hip fracture.  Family states that patient was conversing with them normally on the phone approximately 10 minutes prior to being found.  Case referred to the  medical examiner who state that he will attempt to contact the PCP in the morning for further information before determining whether or not to accept the case.  Final Clinical Impressions(s) / ED Diagnoses   Final diagnoses:  Cardiac arrest [facility])    ED Discharge Orders    None       Chapman Moss, MD 11/30/2017 2350    Elnora Morrison, MD 11-20-2017 0040

## 2017-11-09 ENCOUNTER — Other Ambulatory Visit: Payer: Self-pay | Admitting: *Deleted

## 2017-11-09 ENCOUNTER — Encounter: Payer: Self-pay | Admitting: Family Medicine

## 2017-11-09 ENCOUNTER — Ambulatory Visit: Payer: Self-pay | Admitting: *Deleted

## 2017-11-09 MED FILL — Medication: Qty: 1 | Status: AC

## 2017-12-07 NOTE — Patient Outreach (Signed)
[facility]) Care Management  11-13-2017  STEPHANOS FAN 07/09/1960 [id number]   Member scheduled for home visit today however noted he was found unresponsive, presented to ED, unable to revive.  Will close case as member is now deceased.  [name], [place], MSN [place] [phone]

## 2017-12-07 DEATH — deceased

## 2017-12-09 ENCOUNTER — Ambulatory Visit: Payer: Medicare HMO | Admitting: Podiatry

## 2018-01-09 ENCOUNTER — Other Ambulatory Visit: Payer: Self-pay | Admitting: Family Medicine

## 2018-01-09 DIAGNOSIS — I959 Hypotension, unspecified: Secondary | ICD-10-CM

## 2018-02-03 ENCOUNTER — Ambulatory Visit: Payer: Medicare HMO | Admitting: Family Medicine

## 2018-03-08 ENCOUNTER — Ambulatory Visit (INDEPENDENT_AMBULATORY_CARE_PROVIDER_SITE_OTHER): Payer: Medicare HMO | Admitting: Orthopaedic Surgery

## 2018-05-04 IMAGING — DX DG ANKLE COMPLETE 3+V*L*
3 series · 3 of 3 positions shown · non-contrast
Comparison: Lower leg radiographs from 06/10/2017

CLINICAL DATA: Fall on [REDACTED]. Left hip fracture. Pain in left
ankle.

EXAM:
LEFT ANKLE COMPLETE - 3+ VIEW

[ankle ap]
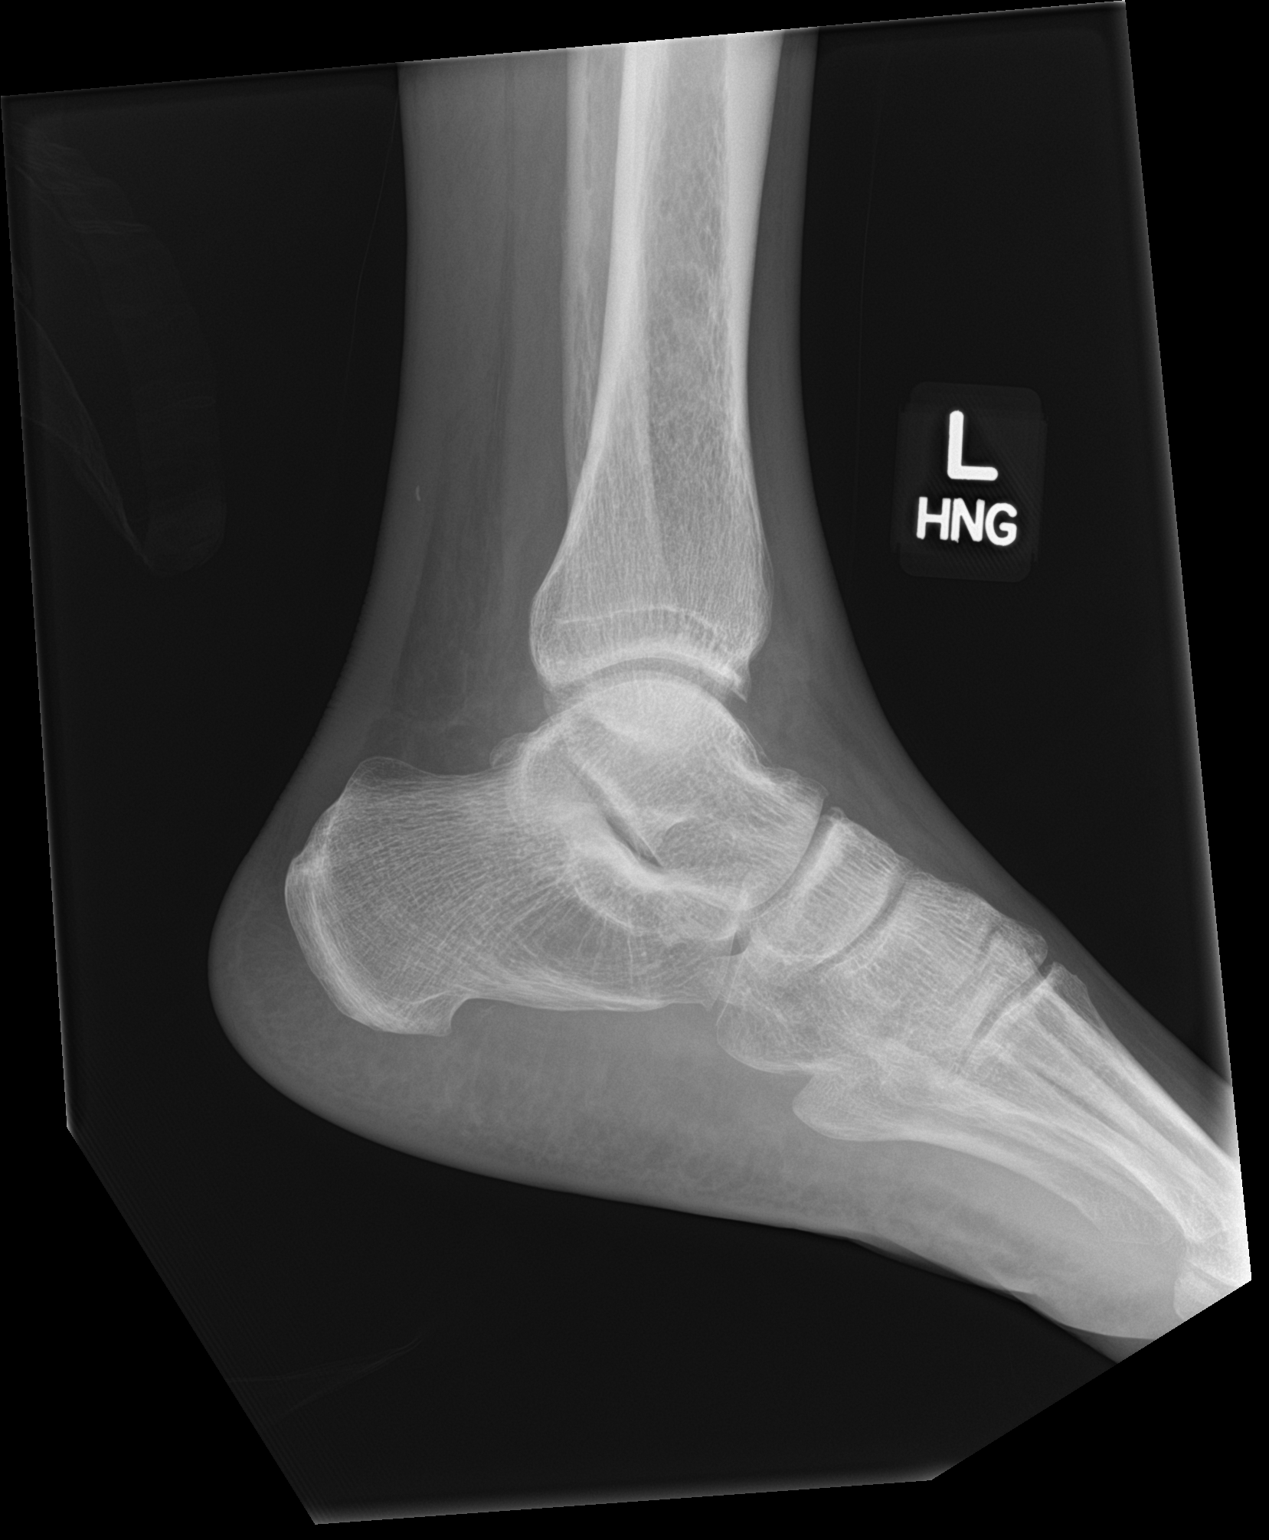

[ankle obl]
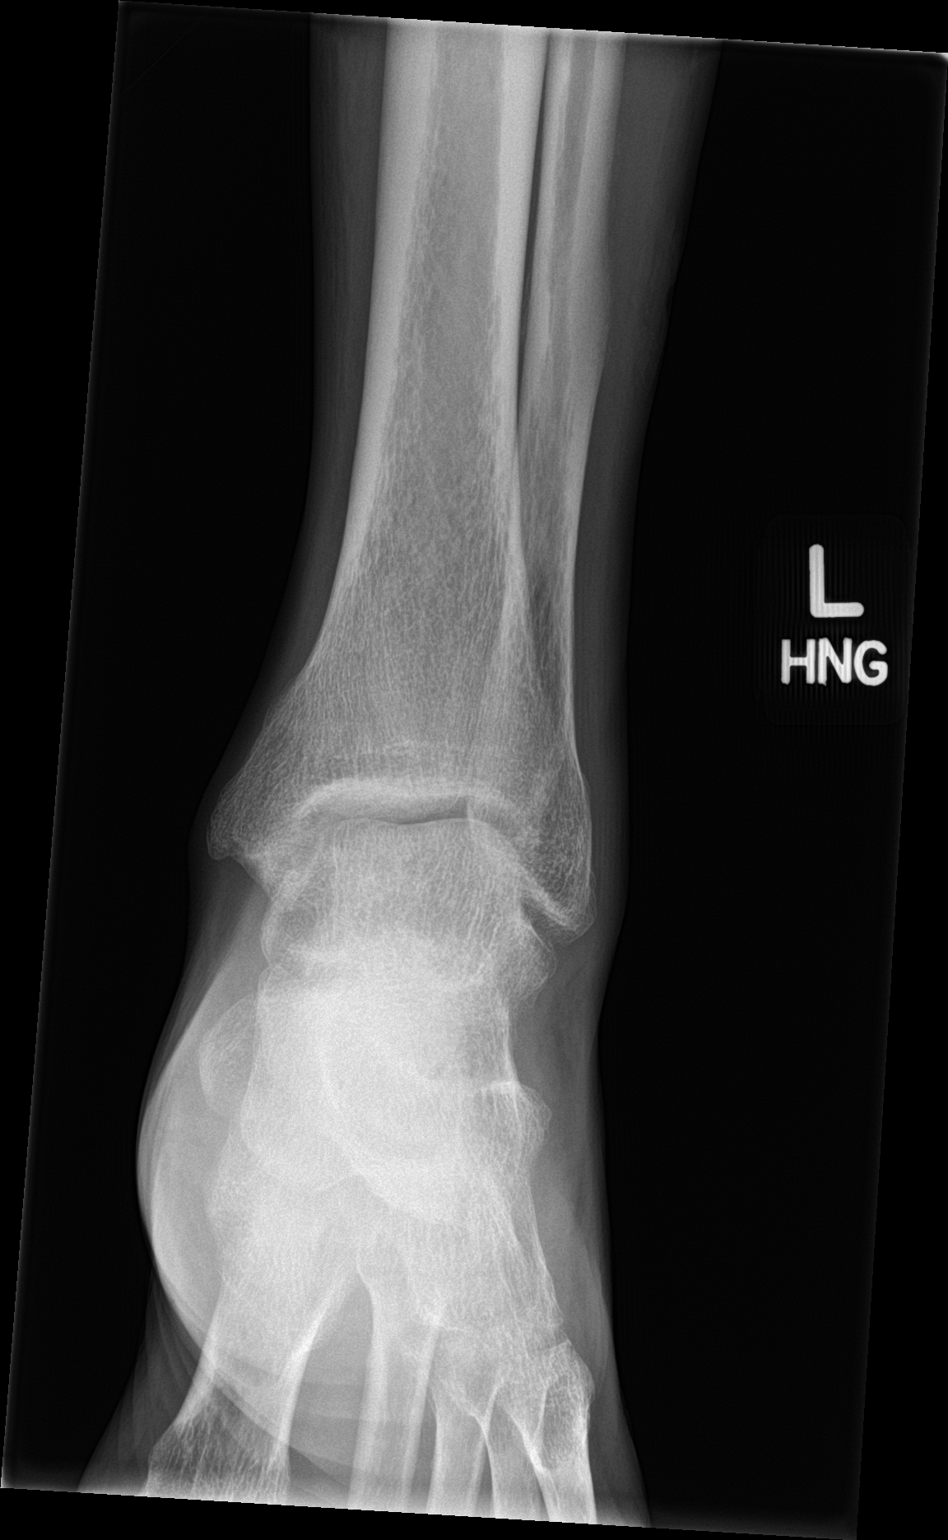

[ankle lat]
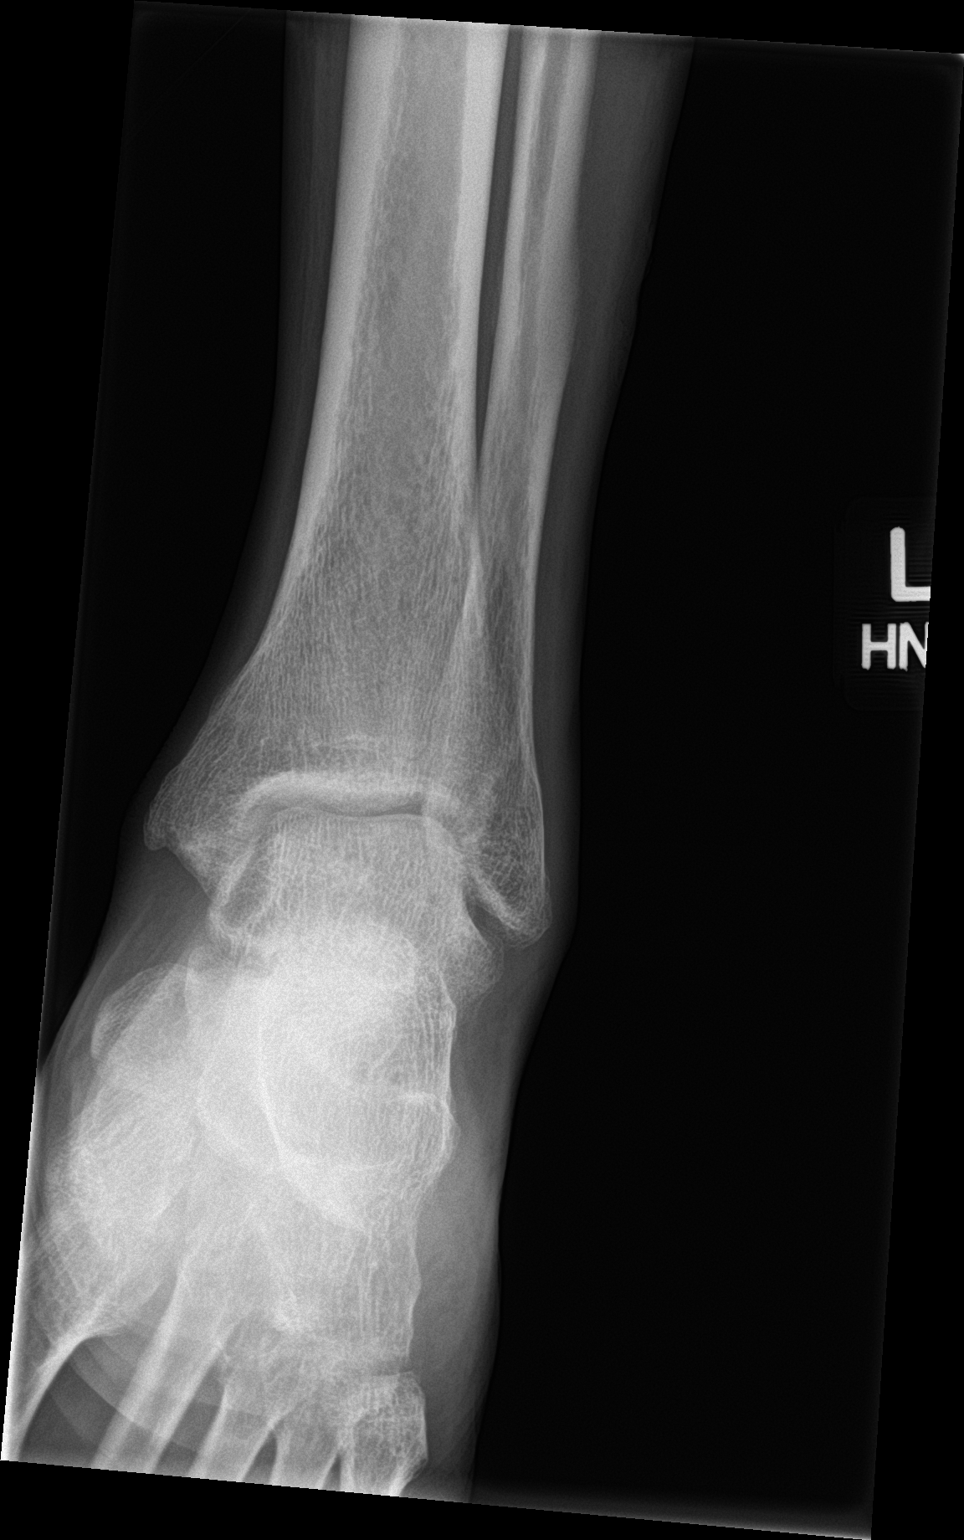

[3 of 3 positions shown; findings below may reference images not displayed]

FINDINGS: Mild spurring of the distal tibial rim. Small plantar calcaneal
spur. Mild dorsal talonavicular spurring.

No acute bony findings.
IMPRESSION: 1. Mild hindfoot spurring. Small plantar calcaneal spur. No acute
bony findings in the ankle.

## 2020-04-09 DEATH — deceased
